# Patient Record
Sex: Male | Born: 1940 | Race: White | Hispanic: No | Smoking: Former smoker
Health system: Southern US, Community
[De-identification: ages and names within clinical notes are randomized; demographics above are authoritative.]

## PROBLEM LIST (undated history)

## (undated) DIAGNOSIS — N210 Calculus in bladder: Secondary | ICD-10-CM

## (undated) DIAGNOSIS — D509 Iron deficiency anemia, unspecified: Secondary | ICD-10-CM

## (undated) DIAGNOSIS — I10 Essential (primary) hypertension: Secondary | ICD-10-CM

## (undated) DIAGNOSIS — F32A Depression, unspecified: Secondary | ICD-10-CM

## (undated) DIAGNOSIS — E785 Hyperlipidemia, unspecified: Secondary | ICD-10-CM

## (undated) DIAGNOSIS — G4730 Sleep apnea, unspecified: Secondary | ICD-10-CM

## (undated) DIAGNOSIS — K219 Gastro-esophageal reflux disease without esophagitis: Secondary | ICD-10-CM

## (undated) DIAGNOSIS — F329 Major depressive disorder, single episode, unspecified: Secondary | ICD-10-CM

## (undated) DIAGNOSIS — G43909 Migraine, unspecified, not intractable, without status migrainosus: Secondary | ICD-10-CM

## (undated) HISTORY — PX: TONSILLECTOMY: SUR1361

## (undated) HISTORY — DX: Depression, unspecified: F32.A

## (undated) HISTORY — DX: Essential (primary) hypertension: I10

## (undated) HISTORY — DX: Sleep apnea, unspecified: G47.30

## (undated) HISTORY — DX: Hyperlipidemia, unspecified: E78.5

## (undated) HISTORY — PX: COLONOSCOPY: SHX174

## (undated) HISTORY — PX: OTHER SURGICAL HISTORY: SHX169

## (undated) HISTORY — DX: Migraine, unspecified, not intractable, without status migrainosus: G43.909

## (undated) HISTORY — DX: Major depressive disorder, single episode, unspecified: F32.9

## (undated) HISTORY — DX: Gastro-esophageal reflux disease without esophagitis: K21.9

---

## 2003-09-09 ENCOUNTER — Other Ambulatory Visit: Payer: Self-pay

## 2004-09-26 ENCOUNTER — Emergency Department: Payer: Self-pay | Admitting: Unknown Physician Specialty

## 2007-12-20 ENCOUNTER — Ambulatory Visit: Payer: Self-pay | Admitting: Gastroenterology

## 2008-03-05 ENCOUNTER — Ambulatory Visit: Payer: Self-pay | Admitting: Family Medicine

## 2009-07-10 ENCOUNTER — Ambulatory Visit: Payer: Self-pay | Admitting: Ophthalmology

## 2009-07-22 ENCOUNTER — Ambulatory Visit: Payer: Self-pay | Admitting: Ophthalmology

## 2009-09-04 ENCOUNTER — Ambulatory Visit: Payer: Self-pay | Admitting: Ophthalmology

## 2009-09-10 ENCOUNTER — Ambulatory Visit: Payer: Self-pay | Admitting: Ophthalmology

## 2009-12-05 ENCOUNTER — Ambulatory Visit: Payer: Self-pay | Admitting: Family Medicine

## 2011-09-30 DIAGNOSIS — F3342 Major depressive disorder, recurrent, in full remission: Secondary | ICD-10-CM | POA: Diagnosis not present

## 2011-10-07 DIAGNOSIS — F3342 Major depressive disorder, recurrent, in full remission: Secondary | ICD-10-CM | POA: Diagnosis not present

## 2011-10-14 DIAGNOSIS — E039 Hypothyroidism, unspecified: Secondary | ICD-10-CM | POA: Diagnosis not present

## 2011-10-14 DIAGNOSIS — E785 Hyperlipidemia, unspecified: Secondary | ICD-10-CM | POA: Diagnosis not present

## 2011-10-14 DIAGNOSIS — I10 Essential (primary) hypertension: Secondary | ICD-10-CM | POA: Diagnosis not present

## 2011-10-14 DIAGNOSIS — N400 Enlarged prostate without lower urinary tract symptoms: Secondary | ICD-10-CM | POA: Diagnosis not present

## 2011-11-02 DIAGNOSIS — F3342 Major depressive disorder, recurrent, in full remission: Secondary | ICD-10-CM | POA: Diagnosis not present

## 2011-11-17 DIAGNOSIS — E039 Hypothyroidism, unspecified: Secondary | ICD-10-CM | POA: Diagnosis not present

## 2011-11-17 DIAGNOSIS — J309 Allergic rhinitis, unspecified: Secondary | ICD-10-CM | POA: Diagnosis not present

## 2011-11-17 DIAGNOSIS — M47812 Spondylosis without myelopathy or radiculopathy, cervical region: Secondary | ICD-10-CM | POA: Diagnosis not present

## 2011-11-24 DIAGNOSIS — E039 Hypothyroidism, unspecified: Secondary | ICD-10-CM | POA: Diagnosis not present

## 2012-01-10 DIAGNOSIS — F3342 Major depressive disorder, recurrent, in full remission: Secondary | ICD-10-CM | POA: Diagnosis not present

## 2012-02-18 DIAGNOSIS — E039 Hypothyroidism, unspecified: Secondary | ICD-10-CM | POA: Diagnosis not present

## 2012-02-18 DIAGNOSIS — N400 Enlarged prostate without lower urinary tract symptoms: Secondary | ICD-10-CM | POA: Diagnosis not present

## 2012-02-18 DIAGNOSIS — G4700 Insomnia, unspecified: Secondary | ICD-10-CM | POA: Diagnosis not present

## 2012-02-23 DIAGNOSIS — Z125 Encounter for screening for malignant neoplasm of prostate: Secondary | ICD-10-CM | POA: Diagnosis not present

## 2012-02-23 DIAGNOSIS — N400 Enlarged prostate without lower urinary tract symptoms: Secondary | ICD-10-CM | POA: Diagnosis not present

## 2012-03-02 DIAGNOSIS — H04129 Dry eye syndrome of unspecified lacrimal gland: Secondary | ICD-10-CM | POA: Diagnosis not present

## 2012-04-06 DIAGNOSIS — F3342 Major depressive disorder, recurrent, in full remission: Secondary | ICD-10-CM | POA: Diagnosis not present

## 2012-06-12 ENCOUNTER — Ambulatory Visit: Payer: Self-pay | Admitting: Family Medicine

## 2012-06-12 DIAGNOSIS — J40 Bronchitis, not specified as acute or chronic: Secondary | ICD-10-CM | POA: Diagnosis not present

## 2012-06-12 DIAGNOSIS — R918 Other nonspecific abnormal finding of lung field: Secondary | ICD-10-CM | POA: Diagnosis not present

## 2012-06-12 DIAGNOSIS — R059 Cough, unspecified: Secondary | ICD-10-CM | POA: Diagnosis not present

## 2012-06-12 DIAGNOSIS — R042 Hemoptysis: Secondary | ICD-10-CM | POA: Diagnosis not present

## 2012-06-12 DIAGNOSIS — F17200 Nicotine dependence, unspecified, uncomplicated: Secondary | ICD-10-CM | POA: Diagnosis not present

## 2012-07-03 DIAGNOSIS — J40 Bronchitis, not specified as acute or chronic: Secondary | ICD-10-CM | POA: Diagnosis not present

## 2012-07-03 DIAGNOSIS — N400 Enlarged prostate without lower urinary tract symptoms: Secondary | ICD-10-CM | POA: Diagnosis not present

## 2012-07-03 DIAGNOSIS — Z8719 Personal history of other diseases of the digestive system: Secondary | ICD-10-CM | POA: Diagnosis not present

## 2012-07-03 DIAGNOSIS — Z23 Encounter for immunization: Secondary | ICD-10-CM | POA: Diagnosis not present

## 2012-07-03 DIAGNOSIS — E039 Hypothyroidism, unspecified: Secondary | ICD-10-CM | POA: Diagnosis not present

## 2012-07-04 DIAGNOSIS — F3342 Major depressive disorder, recurrent, in full remission: Secondary | ICD-10-CM | POA: Diagnosis not present

## 2012-07-07 DIAGNOSIS — I10 Essential (primary) hypertension: Secondary | ICD-10-CM | POA: Diagnosis not present

## 2012-07-07 DIAGNOSIS — E7800 Pure hypercholesterolemia, unspecified: Secondary | ICD-10-CM | POA: Diagnosis not present

## 2012-07-07 DIAGNOSIS — E039 Hypothyroidism, unspecified: Secondary | ICD-10-CM | POA: Diagnosis not present

## 2012-10-06 DIAGNOSIS — N400 Enlarged prostate without lower urinary tract symptoms: Secondary | ICD-10-CM | POA: Diagnosis not present

## 2012-10-06 DIAGNOSIS — M47812 Spondylosis without myelopathy or radiculopathy, cervical region: Secondary | ICD-10-CM | POA: Diagnosis not present

## 2012-10-06 DIAGNOSIS — E039 Hypothyroidism, unspecified: Secondary | ICD-10-CM | POA: Diagnosis not present

## 2012-10-17 DIAGNOSIS — F3342 Major depressive disorder, recurrent, in full remission: Secondary | ICD-10-CM | POA: Diagnosis not present

## 2013-01-16 DIAGNOSIS — F3342 Major depressive disorder, recurrent, in full remission: Secondary | ICD-10-CM | POA: Diagnosis not present

## 2013-02-14 DIAGNOSIS — E039 Hypothyroidism, unspecified: Secondary | ICD-10-CM | POA: Diagnosis not present

## 2013-02-14 DIAGNOSIS — E7800 Pure hypercholesterolemia, unspecified: Secondary | ICD-10-CM | POA: Diagnosis not present

## 2013-02-14 DIAGNOSIS — N400 Enlarged prostate without lower urinary tract symptoms: Secondary | ICD-10-CM | POA: Diagnosis not present

## 2013-02-14 DIAGNOSIS — I10 Essential (primary) hypertension: Secondary | ICD-10-CM | POA: Diagnosis not present

## 2013-03-01 DIAGNOSIS — H43819 Vitreous degeneration, unspecified eye: Secondary | ICD-10-CM | POA: Diagnosis not present

## 2013-04-16 DIAGNOSIS — E039 Hypothyroidism, unspecified: Secondary | ICD-10-CM | POA: Diagnosis not present

## 2013-04-16 DIAGNOSIS — E7800 Pure hypercholesterolemia, unspecified: Secondary | ICD-10-CM | POA: Diagnosis not present

## 2013-04-16 DIAGNOSIS — I10 Essential (primary) hypertension: Secondary | ICD-10-CM | POA: Diagnosis not present

## 2013-04-16 DIAGNOSIS — F329 Major depressive disorder, single episode, unspecified: Secondary | ICD-10-CM | POA: Diagnosis not present

## 2013-04-20 DIAGNOSIS — E039 Hypothyroidism, unspecified: Secondary | ICD-10-CM | POA: Diagnosis not present

## 2013-04-20 DIAGNOSIS — E7800 Pure hypercholesterolemia, unspecified: Secondary | ICD-10-CM | POA: Diagnosis not present

## 2013-04-20 DIAGNOSIS — I10 Essential (primary) hypertension: Secondary | ICD-10-CM | POA: Diagnosis not present

## 2013-05-22 DIAGNOSIS — F3342 Major depressive disorder, recurrent, in full remission: Secondary | ICD-10-CM | POA: Diagnosis not present

## 2013-06-04 DIAGNOSIS — Z23 Encounter for immunization: Secondary | ICD-10-CM | POA: Diagnosis not present

## 2013-07-19 DIAGNOSIS — E039 Hypothyroidism, unspecified: Secondary | ICD-10-CM | POA: Diagnosis not present

## 2013-07-19 DIAGNOSIS — M47812 Spondylosis without myelopathy or radiculopathy, cervical region: Secondary | ICD-10-CM | POA: Diagnosis not present

## 2013-07-19 DIAGNOSIS — E7800 Pure hypercholesterolemia, unspecified: Secondary | ICD-10-CM | POA: Diagnosis not present

## 2013-07-19 DIAGNOSIS — I10 Essential (primary) hypertension: Secondary | ICD-10-CM | POA: Diagnosis not present

## 2013-10-04 DIAGNOSIS — F3342 Major depressive disorder, recurrent, in full remission: Secondary | ICD-10-CM | POA: Diagnosis not present

## 2013-10-25 DIAGNOSIS — M1990 Unspecified osteoarthritis, unspecified site: Secondary | ICD-10-CM | POA: Diagnosis not present

## 2013-10-25 DIAGNOSIS — R7309 Other abnormal glucose: Secondary | ICD-10-CM | POA: Diagnosis not present

## 2013-10-25 DIAGNOSIS — I10 Essential (primary) hypertension: Secondary | ICD-10-CM | POA: Diagnosis not present

## 2013-10-25 DIAGNOSIS — R350 Frequency of micturition: Secondary | ICD-10-CM | POA: Diagnosis not present

## 2013-11-27 DIAGNOSIS — F3342 Major depressive disorder, recurrent, in full remission: Secondary | ICD-10-CM | POA: Diagnosis not present

## 2013-12-25 DIAGNOSIS — F3342 Major depressive disorder, recurrent, in full remission: Secondary | ICD-10-CM | POA: Diagnosis not present

## 2013-12-26 DIAGNOSIS — F3342 Major depressive disorder, recurrent, in full remission: Secondary | ICD-10-CM | POA: Diagnosis not present

## 2014-01-24 DIAGNOSIS — F3342 Major depressive disorder, recurrent, in full remission: Secondary | ICD-10-CM | POA: Diagnosis not present

## 2014-02-22 DIAGNOSIS — R7309 Other abnormal glucose: Secondary | ICD-10-CM | POA: Diagnosis not present

## 2014-02-22 DIAGNOSIS — M47812 Spondylosis without myelopathy or radiculopathy, cervical region: Secondary | ICD-10-CM | POA: Diagnosis not present

## 2014-02-22 DIAGNOSIS — E7800 Pure hypercholesterolemia, unspecified: Secondary | ICD-10-CM | POA: Diagnosis not present

## 2014-02-22 DIAGNOSIS — I10 Essential (primary) hypertension: Secondary | ICD-10-CM | POA: Diagnosis not present

## 2014-02-26 DIAGNOSIS — F3342 Major depressive disorder, recurrent, in full remission: Secondary | ICD-10-CM | POA: Diagnosis not present

## 2014-02-26 DIAGNOSIS — F341 Dysthymic disorder: Secondary | ICD-10-CM | POA: Diagnosis not present

## 2014-02-28 DIAGNOSIS — D509 Iron deficiency anemia, unspecified: Secondary | ICD-10-CM | POA: Diagnosis not present

## 2014-02-28 DIAGNOSIS — I10 Essential (primary) hypertension: Secondary | ICD-10-CM | POA: Diagnosis not present

## 2014-02-28 DIAGNOSIS — E7800 Pure hypercholesterolemia, unspecified: Secondary | ICD-10-CM | POA: Diagnosis not present

## 2014-02-28 DIAGNOSIS — R7309 Other abnormal glucose: Secondary | ICD-10-CM | POA: Diagnosis not present

## 2014-02-28 DIAGNOSIS — E039 Hypothyroidism, unspecified: Secondary | ICD-10-CM | POA: Diagnosis not present

## 2014-03-06 DIAGNOSIS — H43819 Vitreous degeneration, unspecified eye: Secondary | ICD-10-CM | POA: Diagnosis not present

## 2014-03-26 DIAGNOSIS — F3342 Major depressive disorder, recurrent, in full remission: Secondary | ICD-10-CM | POA: Diagnosis not present

## 2014-04-22 DIAGNOSIS — F3342 Major depressive disorder, recurrent, in full remission: Secondary | ICD-10-CM | POA: Diagnosis not present

## 2014-04-30 DIAGNOSIS — F3342 Major depressive disorder, recurrent, in full remission: Secondary | ICD-10-CM | POA: Diagnosis not present

## 2014-05-28 DIAGNOSIS — F3342 Major depressive disorder, recurrent, in full remission: Secondary | ICD-10-CM | POA: Diagnosis not present

## 2014-05-30 DIAGNOSIS — L570 Actinic keratosis: Secondary | ICD-10-CM | POA: Diagnosis not present

## 2014-05-30 DIAGNOSIS — L280 Lichen simplex chronicus: Secondary | ICD-10-CM | POA: Diagnosis not present

## 2014-05-30 DIAGNOSIS — L659 Nonscarring hair loss, unspecified: Secondary | ICD-10-CM | POA: Diagnosis not present

## 2014-05-30 DIAGNOSIS — L821 Other seborrheic keratosis: Secondary | ICD-10-CM | POA: Diagnosis not present

## 2014-05-30 DIAGNOSIS — L723 Sebaceous cyst: Secondary | ICD-10-CM | POA: Diagnosis not present

## 2014-06-18 DIAGNOSIS — Z23 Encounter for immunization: Secondary | ICD-10-CM | POA: Diagnosis not present

## 2014-06-25 DIAGNOSIS — F3342 Major depressive disorder, recurrent, in full remission: Secondary | ICD-10-CM | POA: Diagnosis not present

## 2014-06-26 DIAGNOSIS — M4692 Unspecified inflammatory spondylopathy, cervical region: Secondary | ICD-10-CM | POA: Diagnosis not present

## 2014-06-26 DIAGNOSIS — N403 Nodular prostate with lower urinary tract symptoms: Secondary | ICD-10-CM | POA: Diagnosis not present

## 2014-06-26 DIAGNOSIS — E039 Hypothyroidism, unspecified: Secondary | ICD-10-CM | POA: Diagnosis not present

## 2014-06-26 DIAGNOSIS — E611 Iron deficiency: Secondary | ICD-10-CM | POA: Diagnosis not present

## 2014-06-26 DIAGNOSIS — E780 Pure hypercholesterolemia: Secondary | ICD-10-CM | POA: Diagnosis not present

## 2014-06-26 DIAGNOSIS — F329 Major depressive disorder, single episode, unspecified: Secondary | ICD-10-CM | POA: Diagnosis not present

## 2014-06-26 DIAGNOSIS — Z87898 Personal history of other specified conditions: Secondary | ICD-10-CM | POA: Diagnosis not present

## 2014-06-26 DIAGNOSIS — I10 Essential (primary) hypertension: Secondary | ICD-10-CM | POA: Diagnosis not present

## 2014-06-26 DIAGNOSIS — Z1389 Encounter for screening for other disorder: Secondary | ICD-10-CM | POA: Diagnosis not present

## 2014-07-18 DIAGNOSIS — Z0000 Encounter for general adult medical examination without abnormal findings: Secondary | ICD-10-CM | POA: Diagnosis not present

## 2014-07-18 DIAGNOSIS — F329 Major depressive disorder, single episode, unspecified: Secondary | ICD-10-CM | POA: Diagnosis not present

## 2014-07-18 DIAGNOSIS — I10 Essential (primary) hypertension: Secondary | ICD-10-CM | POA: Diagnosis not present

## 2014-07-18 DIAGNOSIS — Z87898 Personal history of other specified conditions: Secondary | ICD-10-CM | POA: Diagnosis not present

## 2014-07-18 DIAGNOSIS — N403 Nodular prostate with lower urinary tract symptoms: Secondary | ICD-10-CM | POA: Diagnosis not present

## 2014-07-18 DIAGNOSIS — J302 Other seasonal allergic rhinitis: Secondary | ICD-10-CM | POA: Diagnosis not present

## 2014-07-18 DIAGNOSIS — M4692 Unspecified inflammatory spondylopathy, cervical region: Secondary | ICD-10-CM | POA: Diagnosis not present

## 2014-07-18 DIAGNOSIS — E780 Pure hypercholesterolemia: Secondary | ICD-10-CM | POA: Diagnosis not present

## 2014-07-29 DIAGNOSIS — I10 Essential (primary) hypertension: Secondary | ICD-10-CM | POA: Diagnosis not present

## 2014-07-29 DIAGNOSIS — R7309 Other abnormal glucose: Secondary | ICD-10-CM | POA: Diagnosis not present

## 2014-07-29 DIAGNOSIS — N403 Nodular prostate with lower urinary tract symptoms: Secondary | ICD-10-CM | POA: Diagnosis not present

## 2014-07-29 DIAGNOSIS — E611 Iron deficiency: Secondary | ICD-10-CM | POA: Diagnosis not present

## 2014-07-29 DIAGNOSIS — E780 Pure hypercholesterolemia: Secondary | ICD-10-CM | POA: Diagnosis not present

## 2014-07-30 DIAGNOSIS — F3342 Major depressive disorder, recurrent, in full remission: Secondary | ICD-10-CM | POA: Diagnosis not present

## 2014-08-12 DIAGNOSIS — F3342 Major depressive disorder, recurrent, in full remission: Secondary | ICD-10-CM | POA: Diagnosis not present

## 2014-08-19 DIAGNOSIS — L820 Inflamed seborrheic keratosis: Secondary | ICD-10-CM | POA: Diagnosis not present

## 2014-08-19 DIAGNOSIS — L821 Other seborrheic keratosis: Secondary | ICD-10-CM | POA: Diagnosis not present

## 2014-08-19 DIAGNOSIS — L409 Psoriasis, unspecified: Secondary | ICD-10-CM | POA: Diagnosis not present

## 2014-08-19 DIAGNOSIS — R209 Unspecified disturbances of skin sensation: Secondary | ICD-10-CM | POA: Diagnosis not present

## 2014-08-20 DIAGNOSIS — H9319 Tinnitus, unspecified ear: Secondary | ICD-10-CM | POA: Diagnosis not present

## 2014-08-20 DIAGNOSIS — H6123 Impacted cerumen, bilateral: Secondary | ICD-10-CM | POA: Diagnosis not present

## 2014-08-27 DIAGNOSIS — F3342 Major depressive disorder, recurrent, in full remission: Secondary | ICD-10-CM | POA: Diagnosis not present

## 2014-09-26 DIAGNOSIS — F3342 Major depressive disorder, recurrent, in full remission: Secondary | ICD-10-CM | POA: Diagnosis not present

## 2014-10-03 ENCOUNTER — Ambulatory Visit: Payer: Self-pay | Admitting: Gastroenterology

## 2014-10-03 DIAGNOSIS — Z6833 Body mass index (BMI) 33.0-33.9, adult: Secondary | ICD-10-CM | POA: Diagnosis not present

## 2014-10-03 DIAGNOSIS — E039 Hypothyroidism, unspecified: Secondary | ICD-10-CM | POA: Diagnosis not present

## 2014-10-03 DIAGNOSIS — D122 Benign neoplasm of ascending colon: Secondary | ICD-10-CM | POA: Diagnosis not present

## 2014-10-03 DIAGNOSIS — M479 Spondylosis, unspecified: Secondary | ICD-10-CM | POA: Diagnosis not present

## 2014-10-03 DIAGNOSIS — D120 Benign neoplasm of cecum: Secondary | ICD-10-CM | POA: Diagnosis not present

## 2014-10-03 DIAGNOSIS — E780 Pure hypercholesterolemia: Secondary | ICD-10-CM | POA: Diagnosis not present

## 2014-10-03 DIAGNOSIS — I10 Essential (primary) hypertension: Secondary | ICD-10-CM | POA: Diagnosis not present

## 2014-10-03 DIAGNOSIS — E663 Overweight: Secondary | ICD-10-CM | POA: Diagnosis not present

## 2014-10-03 DIAGNOSIS — Z86010 Personal history of colonic polyps: Secondary | ICD-10-CM | POA: Diagnosis not present

## 2014-10-03 DIAGNOSIS — K219 Gastro-esophageal reflux disease without esophagitis: Secondary | ICD-10-CM | POA: Diagnosis not present

## 2014-10-03 DIAGNOSIS — D125 Benign neoplasm of sigmoid colon: Secondary | ICD-10-CM | POA: Diagnosis not present

## 2014-10-29 DIAGNOSIS — F3342 Major depressive disorder, recurrent, in full remission: Secondary | ICD-10-CM | POA: Diagnosis not present

## 2014-11-04 DIAGNOSIS — F3342 Major depressive disorder, recurrent, in full remission: Secondary | ICD-10-CM | POA: Diagnosis not present

## 2014-11-19 DIAGNOSIS — I10 Essential (primary) hypertension: Secondary | ICD-10-CM | POA: Diagnosis not present

## 2014-11-19 DIAGNOSIS — E780 Pure hypercholesterolemia: Secondary | ICD-10-CM | POA: Diagnosis not present

## 2014-11-19 DIAGNOSIS — R7309 Other abnormal glucose: Secondary | ICD-10-CM | POA: Diagnosis not present

## 2014-11-19 DIAGNOSIS — N403 Nodular prostate with lower urinary tract symptoms: Secondary | ICD-10-CM | POA: Diagnosis not present

## 2014-11-19 DIAGNOSIS — Z87898 Personal history of other specified conditions: Secondary | ICD-10-CM | POA: Diagnosis not present

## 2014-11-19 DIAGNOSIS — M4692 Unspecified inflammatory spondylopathy, cervical region: Secondary | ICD-10-CM | POA: Diagnosis not present

## 2014-11-19 DIAGNOSIS — J302 Other seasonal allergic rhinitis: Secondary | ICD-10-CM | POA: Diagnosis not present

## 2014-11-19 DIAGNOSIS — E039 Hypothyroidism, unspecified: Secondary | ICD-10-CM | POA: Diagnosis not present

## 2014-11-26 DIAGNOSIS — F3342 Major depressive disorder, recurrent, in full remission: Secondary | ICD-10-CM | POA: Diagnosis not present

## 2014-12-31 DIAGNOSIS — F3342 Major depressive disorder, recurrent, in full remission: Secondary | ICD-10-CM | POA: Diagnosis not present

## 2015-01-10 ENCOUNTER — Emergency Department
Admit: 2015-01-10 | Disposition: A | Discharge status: Home / Self Care | Payer: Self-pay | Admitting: Emergency Medicine

## 2015-01-10 DIAGNOSIS — R51 Headache: Secondary | ICD-10-CM | POA: Diagnosis not present

## 2015-01-10 DIAGNOSIS — M542 Cervicalgia: Secondary | ICD-10-CM | POA: Diagnosis not present

## 2015-01-10 DIAGNOSIS — M549 Dorsalgia, unspecified: Secondary | ICD-10-CM | POA: Diagnosis not present

## 2015-01-10 DIAGNOSIS — S3992XA Unspecified injury of lower back, initial encounter: Secondary | ICD-10-CM | POA: Diagnosis not present

## 2015-01-10 DIAGNOSIS — S199XXA Unspecified injury of neck, initial encounter: Secondary | ICD-10-CM | POA: Diagnosis not present

## 2015-01-29 DIAGNOSIS — F3342 Major depressive disorder, recurrent, in full remission: Secondary | ICD-10-CM | POA: Diagnosis not present

## 2015-01-29 DIAGNOSIS — F439 Reaction to severe stress, unspecified: Secondary | ICD-10-CM | POA: Diagnosis not present

## 2015-03-25 DIAGNOSIS — Z961 Presence of intraocular lens: Secondary | ICD-10-CM | POA: Diagnosis not present

## 2015-04-14 ENCOUNTER — Encounter: Payer: Self-pay | Admitting: Family Medicine

## 2015-04-14 ENCOUNTER — Ambulatory Visit (INDEPENDENT_AMBULATORY_CARE_PROVIDER_SITE_OTHER): Payer: Medicare Other | Admitting: Family Medicine

## 2015-04-14 VITALS — BP 134/87 | HR 90 | Temp 98.1°F | Resp 16 | Ht 70.0 in | Wt 233.8 lb

## 2015-04-14 DIAGNOSIS — E039 Hypothyroidism, unspecified: Secondary | ICD-10-CM | POA: Insufficient documentation

## 2015-04-14 DIAGNOSIS — F3341 Major depressive disorder, recurrent, in partial remission: Secondary | ICD-10-CM | POA: Insufficient documentation

## 2015-04-14 DIAGNOSIS — Z8639 Personal history of other endocrine, nutritional and metabolic disease: Secondary | ICD-10-CM | POA: Insufficient documentation

## 2015-04-14 DIAGNOSIS — F329 Major depressive disorder, single episode, unspecified: Secondary | ICD-10-CM | POA: Insufficient documentation

## 2015-04-14 DIAGNOSIS — I10 Essential (primary) hypertension: Secondary | ICD-10-CM | POA: Insufficient documentation

## 2015-04-14 DIAGNOSIS — H9192 Unspecified hearing loss, left ear: Secondary | ICD-10-CM | POA: Diagnosis not present

## 2015-04-14 DIAGNOSIS — M1990 Unspecified osteoarthritis, unspecified site: Secondary | ICD-10-CM | POA: Insufficient documentation

## 2015-04-14 DIAGNOSIS — H9319 Tinnitus, unspecified ear: Secondary | ICD-10-CM | POA: Insufficient documentation

## 2015-04-14 DIAGNOSIS — Z0000 Encounter for general adult medical examination without abnormal findings: Secondary | ICD-10-CM

## 2015-04-14 DIAGNOSIS — G43909 Migraine, unspecified, not intractable, without status migrainosus: Secondary | ICD-10-CM | POA: Insufficient documentation

## 2015-04-14 DIAGNOSIS — G4700 Insomnia, unspecified: Secondary | ICD-10-CM | POA: Insufficient documentation

## 2015-04-14 DIAGNOSIS — N138 Other obstructive and reflux uropathy: Secondary | ICD-10-CM | POA: Insufficient documentation

## 2015-04-14 DIAGNOSIS — E782 Mixed hyperlipidemia: Secondary | ICD-10-CM | POA: Insufficient documentation

## 2015-04-14 DIAGNOSIS — J302 Other seasonal allergic rhinitis: Secondary | ICD-10-CM | POA: Insufficient documentation

## 2015-04-14 DIAGNOSIS — N400 Enlarged prostate without lower urinary tract symptoms: Secondary | ICD-10-CM | POA: Insufficient documentation

## 2015-04-14 DIAGNOSIS — N401 Enlarged prostate with lower urinary tract symptoms: Secondary | ICD-10-CM

## 2015-04-14 DIAGNOSIS — Z8619 Personal history of other infectious and parasitic diseases: Secondary | ICD-10-CM | POA: Insufficient documentation

## 2015-04-14 DIAGNOSIS — Z87898 Personal history of other specified conditions: Secondary | ICD-10-CM | POA: Insufficient documentation

## 2015-04-14 DIAGNOSIS — Z8768 Personal history of other (corrected) conditions arising in the perinatal period: Secondary | ICD-10-CM | POA: Insufficient documentation

## 2015-04-14 NOTE — Progress Notes (Signed)
Subjective:   Joe Hardy is a 74 y.o. male who presents for Medicare Annual/Subsequent preventive examination.  Review of Systems:  Negative except for:  L ear hearing issues-no tinnitus       Objective:    Vitals: BP 134/87 mmHg  Pulse 90  Temp(Src) 98.1 F (36.7 C) (Oral)  Resp 16  Ht 5\' 10"  (1.778 m)  Wt 233 lb 12.8 oz (106.051 kg)  BMI 33.55 kg/m2  Tobacco History  Smoking status  . Never Smoker   Smokeless tobacco  . Not on file     Counseling given: Not Answered   Past Medical History  Diagnosis Date  . Depression   . GERD (gastroesophageal reflux disease)   . Sleep apnea   . Hyperlipidemia   . Hypertension   . Migraine    History reviewed. No pertinent past surgical history. Family History  Problem Relation Age of Onset  . Family history unknown: Yes   History  Sexual Activity  . Sexual Activity: Not on file    Outpatient Encounter Prescriptions as of 04/14/2015  Medication Sig  . atorvastatin (LIPITOR) 20 MG tablet   . buPROPion (WELLBUTRIN XL) 150 MG 24 hr tablet   . Choline Fenofibrate (FENOFIBRIC ACID) 135 MG CPDR   . levothyroxine (SYNTHROID, LEVOTHROID) 75 MCG tablet   . lisinopril-hydrochlorothiazide (PRINZIDE,ZESTORETIC) 10-12.5 MG per tablet   . mirtazapine (REMERON SOL-TAB) 30 MG disintegrating tablet   . terazosin (HYTRIN) 5 MG capsule   . traZODone (DESYREL) 150 MG tablet    No facility-administered encounter medications on file as of 04/14/2015.    Activities of Daily Living In your present state of health, do you have any difficulty performing the following activities: 04/14/2015  Hearing? Y  Vision? N  Difficulty concentrating or making decisions? N  Walking or climbing stairs? N  Dressing or bathing? N  Doing errands, shopping? N  Preparing Food and eating ? Y  Using the Toilet? Y  In the past six months, have you accidently leaked urine? N  Do you have problems with loss of bowel control? N  Managing your  Medications? Y  Managing your Finances? Y  Housekeeping or managing your Housekeeping? Y    Patient Care Team: Arlis Porta., MD as PCP - General (Family Medicine)   Dr. Court Joy, Psychiatry Dr. Odelia Gage Eye Assessment:    Medicare Annual Wellness Visit    L hearing abnormal- refer to audiology for screening Exercise Activities and Dietary recommendations  Reduce sugar sweetened beverages and concentrated sweets.  Encouraged continued walking three times daily.   Advanced directive discussed, more information given.   Goals    . Reduce calorie intake to 2000 calories per day     Reduce calories from sugar sweetened beverages and concentrated sweets.     . Reduced depressive symptoms.     Pt would like to work with his psychiatrist to reduce depressive symptoms.     . Weight < 200 lb (90.719 kg)     Wants to loss weight and having less depression      Fall Risk Fall Risk  04/14/2015  Falls in the past year? No   Depression Screen PHQ 2/9 Scores 04/14/2015  PHQ - 2 Score 2  PHQ- 9 Score 4    Cognitive Testing MMSE - Mini Mental State Exam 04/14/2015  Orientation to time 5  Orientation to Place 5  Registration 3  Attention/ Calculation 5  Recall 3  Language- name 2  objects 2  Language- repeat 1  Language- follow 3 step command 3  Language- read & follow direction 1  Write a sentence 1  Copy design 1  Total score 30    Immunization History  Administered Date(s) Administered  . Influenza-Unspecified 06/13/2014  . Pneumococcal Conjugate-13 03/13/2014  . Pneumococcal Polysaccharide-23 09/13/2012  . Zoster 09/13/2012   Screening Tests There are no preventive care reminders to display for this patient.    Plan:    During the course of the visit the patient was educated and counseled about the following appropriate screening and preventive services:   Vaccines to include Pneumoccal, Influenza, Hepatitis B, Td, Zostavax, HCV  Cardiovascular  Disease  Colorectal cancer screening  Diabetes screening  Prostate Cancer Screening  Glaucoma screening  Nutrition counseling   Smoking cessation counseling  Patient Instructions (the written plan) was given to the patient.    Charlesia Canaday L Creta Dorame, NP  04/14/2015

## 2015-04-14 NOTE — Patient Instructions (Signed)

## 2015-04-17 ENCOUNTER — Encounter: Payer: Self-pay | Admitting: Family Medicine

## 2015-04-17 ENCOUNTER — Ambulatory Visit (INDEPENDENT_AMBULATORY_CARE_PROVIDER_SITE_OTHER): Payer: Medicare Other | Admitting: Family Medicine

## 2015-04-17 VITALS — BP 126/77 | HR 74 | Temp 98.3°F | Resp 16 | Ht 70.0 in | Wt 234.8 lb

## 2015-04-17 DIAGNOSIS — E039 Hypothyroidism, unspecified: Secondary | ICD-10-CM | POA: Diagnosis not present

## 2015-04-17 DIAGNOSIS — N401 Enlarged prostate with lower urinary tract symptoms: Secondary | ICD-10-CM

## 2015-04-17 DIAGNOSIS — N138 Other obstructive and reflux uropathy: Secondary | ICD-10-CM

## 2015-04-17 DIAGNOSIS — E7800 Pure hypercholesterolemia, unspecified: Secondary | ICD-10-CM

## 2015-04-17 DIAGNOSIS — I10 Essential (primary) hypertension: Secondary | ICD-10-CM

## 2015-04-17 DIAGNOSIS — R7309 Other abnormal glucose: Secondary | ICD-10-CM

## 2015-04-17 DIAGNOSIS — E119 Type 2 diabetes mellitus without complications: Secondary | ICD-10-CM | POA: Diagnosis not present

## 2015-04-17 LAB — POCT GLYCOSYLATED HEMOGLOBIN (HGB A1C): HEMOGLOBIN A1C: 5.7
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
## 2015-04-17 NOTE — Addendum Note (Signed)
Addended by: Theresia Majors A on: 04/17/2015 09:51 AM   Modules accepted: Miquel Dunn

## 2015-04-17 NOTE — Patient Instructions (Signed)
Continue to take meds as directed.  Continue to see Psych [id number].

## 2015-04-22 DIAGNOSIS — E039 Hypothyroidism, unspecified: Secondary | ICD-10-CM | POA: Diagnosis not present

## 2015-04-22 DIAGNOSIS — N401 Enlarged prostate with lower urinary tract symptoms: Secondary | ICD-10-CM | POA: Diagnosis not present

## 2015-04-22 DIAGNOSIS — E780 Pure hypercholesterolemia: Secondary | ICD-10-CM | POA: Diagnosis not present

## 2015-04-22 DIAGNOSIS — I10 Essential (primary) hypertension: Secondary | ICD-10-CM | POA: Diagnosis not present

## 2015-04-22 LAB — CBC WITH DIFFERENTIAL/PLATELET
Basophils Absolute: 0 10*3/uL (ref 0.0–0.2)
Basos: 1 %
EOS (ABSOLUTE): 0.2 10*3/uL (ref 0.0–0.4)
Eos: 3 %
HEMATOCRIT: 38.5 % (ref 37.5–51.0)
HEMOGLOBIN: 13 g/dL (ref 12.6–17.7)
Immature Grans (Abs): 0 10*3/uL (ref 0.0–0.1)
Immature Granulocytes: 0 %
LYMPHS: 33 %
Lymphocytes Absolute: 2.4 10*3/uL (ref 0.7–3.1)
MCH: 23.1 pg — AB (ref 26.6–33.0)
MCHC: 33.8 g/dL (ref 31.5–35.7)
MCV: 69 fL — ABNORMAL LOW (ref 79–97)
Monocytes Absolute: 0.8 10*3/uL (ref 0.1–0.9)
Monocytes: 10 %
NEUTROS ABS: 4 10*3/uL (ref 1.4–7.0)
NEUTROS PCT: 53 %
Platelets: 257 10*3/uL (ref 150–379)
RBC: 5.62 x10E6/uL (ref 4.14–5.80)
RDW: 16 % — ABNORMAL HIGH (ref 12.3–15.4)
WBC: 7.4 10*3/uL (ref 3.4–10.8)

## 2015-04-23 LAB — LIPID PANEL
CHOL/HDL RATIO: 4.3 ratio (ref 0.0–5.0)
Cholesterol, Total: 126 mg/dL (ref 100–199)
HDL: 29 mg/dL — ABNORMAL LOW (ref >39)
LDL CALC: 79 mg/dL (ref 0–99)
Triglycerides: 90 mg/dL (ref 0–149)
VLDL Cholesterol Cal: 18 mg/dL (ref 5–40)

## 2015-04-23 LAB — COMPREHENSIVE METABOLIC PANEL
ALBUMIN: 4.4 g/dL (ref 3.5–4.8)
ALT: 21 IU/L (ref 0–44)
AST: 23 IU/L (ref 0–40)
Albumin/Globulin Ratio: 2 (ref 1.1–2.5)
Alkaline Phosphatase: 33 IU/L — ABNORMAL LOW (ref 39–117)
BILIRUBIN TOTAL: 0.4 mg/dL (ref 0.0–1.2)
BUN/Creatinine Ratio: 12 (ref 10–22)
BUN: 15 mg/dL (ref 8–27)
CO2: 22 mmol/L (ref 18–29)
Calcium: 9.4 mg/dL (ref 8.6–10.2)
Chloride: 101 mmol/L (ref 97–108)
Creatinine, Ser: 1.22 mg/dL (ref 0.76–1.27)
GFR calc non Af Amer: 58 mL/min/{1.73_m2} — ABNORMAL LOW (ref >59)
GFR, EST AFRICAN AMERICAN: 68 mL/min/{1.73_m2} (ref >59)
Globulin, Total: 2.2 g/dL (ref 1.5–4.5)
Glucose: 98 mg/dL (ref 65–99)
POTASSIUM: 4.1 mmol/L (ref 3.5–5.2)
Sodium: 141 mmol/L (ref 134–144)
TOTAL PROTEIN: 6.6 g/dL (ref 6.0–8.5)

## 2015-04-23 LAB — TSH: TSH: 3.21 u[IU]/mL (ref 0.450–4.500)

## 2015-05-06 ENCOUNTER — Other Ambulatory Visit: Payer: Self-pay | Admitting: Otolaryngology

## 2015-05-06 DIAGNOSIS — R2689 Other abnormalities of gait and mobility: Secondary | ICD-10-CM

## 2015-05-06 DIAGNOSIS — H903 Sensorineural hearing loss, bilateral: Secondary | ICD-10-CM | POA: Diagnosis not present

## 2015-05-06 DIAGNOSIS — H9312 Tinnitus, left ear: Secondary | ICD-10-CM

## 2015-05-06 DIAGNOSIS — R42 Dizziness and giddiness: Secondary | ICD-10-CM

## 2015-05-08 ENCOUNTER — Ambulatory Visit
Admission: RE | Admit: 2015-05-08 | Discharge: 2015-05-08 | Disposition: A | Discharge status: Home / Self Care | Payer: Medicare Other | Source: Ambulatory Visit | Attending: Otolaryngology | Admitting: Otolaryngology

## 2015-05-08 ENCOUNTER — Ambulatory Visit: Payer: Self-pay

## 2015-05-08 DIAGNOSIS — H9319 Tinnitus, unspecified ear: Secondary | ICD-10-CM | POA: Diagnosis not present

## 2015-05-08 DIAGNOSIS — R42 Dizziness and giddiness: Secondary | ICD-10-CM

## 2015-05-08 DIAGNOSIS — R2689 Other abnormalities of gait and mobility: Secondary | ICD-10-CM

## 2015-05-08 DIAGNOSIS — H9312 Tinnitus, left ear: Secondary | ICD-10-CM

## 2015-05-09 ENCOUNTER — Other Ambulatory Visit: Payer: Self-pay | Admitting: Otolaryngology

## 2015-05-09 DIAGNOSIS — H9319 Tinnitus, unspecified ear: Secondary | ICD-10-CM

## 2015-05-12 DIAGNOSIS — L821 Other seborrheic keratosis: Secondary | ICD-10-CM | POA: Diagnosis not present

## 2015-05-12 DIAGNOSIS — L570 Actinic keratosis: Secondary | ICD-10-CM | POA: Diagnosis not present

## 2015-05-12 DIAGNOSIS — L720 Epidermal cyst: Secondary | ICD-10-CM | POA: Diagnosis not present

## 2015-05-12 DIAGNOSIS — D492 Neoplasm of unspecified behavior of bone, soft tissue, and skin: Secondary | ICD-10-CM | POA: Diagnosis not present

## 2015-05-16 ENCOUNTER — Ambulatory Visit
Admission: RE | Admit: 2015-05-16 | Discharge: 2015-05-16 | Disposition: A | Discharge status: Home / Self Care | Payer: Medicare Other | Source: Ambulatory Visit | Attending: Otolaryngology | Admitting: Otolaryngology

## 2015-05-16 DIAGNOSIS — H9319 Tinnitus, unspecified ear: Secondary | ICD-10-CM | POA: Diagnosis not present

## 2015-05-16 DIAGNOSIS — H9193 Unspecified hearing loss, bilateral: Secondary | ICD-10-CM | POA: Diagnosis not present

## 2015-05-16 MED ORDER — GADOBENATE DIMEGLUMINE 529 MG/ML IV SOLN
20.0000 mL | Freq: Once | INTRAVENOUS | Status: AC | PRN
  Administered 2015-05-16: 20 mL via INTRAVENOUS

## 2015-05-26 DIAGNOSIS — M9901 Segmental and somatic dysfunction of cervical region: Secondary | ICD-10-CM | POA: Diagnosis not present

## 2015-05-26 DIAGNOSIS — M9903 Segmental and somatic dysfunction of lumbar region: Secondary | ICD-10-CM | POA: Diagnosis not present

## 2015-05-26 DIAGNOSIS — M62830 Muscle spasm of back: Secondary | ICD-10-CM | POA: Diagnosis not present

## 2015-05-26 DIAGNOSIS — M5033 Other cervical disc degeneration, cervicothoracic region: Secondary | ICD-10-CM | POA: Diagnosis not present

## 2015-06-02 ENCOUNTER — Telehealth: Payer: Self-pay | Admitting: Family Medicine

## 2015-06-02 NOTE — Telephone Encounter (Signed)
Pt said he had frequent urination for about 1 week and suddenly that stopped.  He has no symptoms now.  He scheduled an appt for Thursday but asked if he should keep or cancel it.  Please call [phone]

## 2015-06-02 NOTE — Telephone Encounter (Signed)
Pt advised to cancel his appointment if he doesn't have any more Sx of UTI

## 2015-06-05 ENCOUNTER — Ambulatory Visit: Payer: Medicare Other | Admitting: Family Medicine

## 2015-06-23 DIAGNOSIS — M5033 Other cervical disc degeneration, cervicothoracic region: Secondary | ICD-10-CM | POA: Diagnosis not present

## 2015-06-23 DIAGNOSIS — M9901 Segmental and somatic dysfunction of cervical region: Secondary | ICD-10-CM | POA: Diagnosis not present

## 2015-06-23 DIAGNOSIS — M9903 Segmental and somatic dysfunction of lumbar region: Secondary | ICD-10-CM | POA: Diagnosis not present

## 2015-06-23 DIAGNOSIS — M62830 Muscle spasm of back: Secondary | ICD-10-CM | POA: Diagnosis not present

## 2015-07-18 ENCOUNTER — Encounter: Payer: Self-pay | Admitting: Family Medicine

## 2015-07-18 ENCOUNTER — Ambulatory Visit (INDEPENDENT_AMBULATORY_CARE_PROVIDER_SITE_OTHER): Payer: Medicare Other | Admitting: Family Medicine

## 2015-07-18 VITALS — BP 113/74 | HR 78 | Temp 98.3°F | Resp 16 | Ht 70.0 in | Wt 227.0 lb

## 2015-07-18 DIAGNOSIS — R739 Hyperglycemia, unspecified: Secondary | ICD-10-CM

## 2015-07-18 DIAGNOSIS — F329 Major depressive disorder, single episode, unspecified: Secondary | ICD-10-CM | POA: Diagnosis not present

## 2015-07-18 DIAGNOSIS — E7800 Pure hypercholesterolemia, unspecified: Secondary | ICD-10-CM

## 2015-07-18 DIAGNOSIS — F32A Depression, unspecified: Secondary | ICD-10-CM

## 2015-07-18 DIAGNOSIS — I10 Essential (primary) hypertension: Secondary | ICD-10-CM | POA: Diagnosis not present

## 2015-07-18 DIAGNOSIS — Z23 Encounter for immunization: Secondary | ICD-10-CM | POA: Diagnosis not present

## 2015-07-18 DIAGNOSIS — E039 Hypothyroidism, unspecified: Secondary | ICD-10-CM

## 2015-07-18 LAB — POCT GLYCOSYLATED HEMOGLOBIN (HGB A1C): HEMOGLOBIN A1C: 5.4

## 2015-07-18 NOTE — Addendum Note (Signed)
Addended by: Frederich Cha D on: 07/18/2015 10:01 AM   Modules accepted: Miquel Dunn

## 2015-07-18 NOTE — Patient Instructions (Addendum)
He is praised re: weight loss recently.  Continue current meds at present doses.
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
## 2015-11-24 ENCOUNTER — Encounter: Payer: Self-pay | Admitting: Family Medicine

## 2015-11-24 ENCOUNTER — Ambulatory Visit (INDEPENDENT_AMBULATORY_CARE_PROVIDER_SITE_OTHER): Payer: Medicare Other | Admitting: Family Medicine

## 2015-11-24 VITALS — BP 123/79 | HR 93 | Resp 16 | Ht 70.0 in | Wt 230.6 lb

## 2015-11-24 DIAGNOSIS — I10 Essential (primary) hypertension: Secondary | ICD-10-CM

## 2015-11-24 DIAGNOSIS — N401 Enlarged prostate with lower urinary tract symptoms: Secondary | ICD-10-CM

## 2015-11-24 DIAGNOSIS — E669 Obesity, unspecified: Secondary | ICD-10-CM | POA: Insufficient documentation

## 2015-11-24 DIAGNOSIS — E7800 Pure hypercholesterolemia, unspecified: Secondary | ICD-10-CM

## 2015-11-24 DIAGNOSIS — E038 Other specified hypothyroidism: Secondary | ICD-10-CM | POA: Diagnosis not present

## 2015-11-24 DIAGNOSIS — E034 Atrophy of thyroid (acquired): Secondary | ICD-10-CM

## 2015-11-24 DIAGNOSIS — N138 Other obstructive and reflux uropathy: Secondary | ICD-10-CM

## 2015-11-24 MED ORDER — TERAZOSIN HCL 5 MG PO CAPS: 5.0000 mg | ORAL_CAPSULE | Freq: Once | ORAL | Status: DC

## 2015-11-24 MED ORDER — LEVOTHYROXINE SODIUM 75 MCG PO TABS: 75.0000 ug | ORAL_TABLET | Freq: Every day | ORAL | Status: DC

## 2015-11-24 MED ORDER — ATORVASTATIN CALCIUM 20 MG PO TABS: 20.0000 mg | ORAL_TABLET | Freq: Every day | ORAL | Status: DC

## 2015-11-24 MED ORDER — FENOFIBRIC ACID 135 MG PO CPDR: 135.0000 mg | DELAYED_RELEASE_CAPSULE | Freq: Every morning | ORAL | Status: DC

## 2015-11-24 MED ORDER — LISINOPRIL-HYDROCHLOROTHIAZIDE 10-12.5 MG PO TABS: 1.0000 | ORAL_TABLET | Freq: Every day | ORAL | Status: DC

## 2015-11-24 NOTE — Progress Notes (Signed)
Name: Joe Hardy   MRN: [id number]    DOB: [birth year]   Date:11/24/2015       Progress Note  Subjective  Chief Complaint  Chief Complaint  Patient presents with  . Hypertension    HPI Here for f/u of HBP.  Overall feeling well except for arthritis pain due to cold and damp.  Taking all his meds.  BPs at other MD visits have been dong well.     No problem-specific assessment & plan notes found for this encounter.   Past Medical History  Diagnosis Date  . Depression   . GERD (gastroesophageal reflux disease)   . Sleep apnea   . Hyperlipidemia   . Hypertension   . Migraine     Past Surgical History  Procedure Laterality Date  . Tonsillectomy    . Oral surgery      Family History  Problem Relation Age of Onset  . Tuberculosis Mother   . Asthma Mother   . Emphysema Father   . Heart attack Father     Social History   Social History  . Marital Status: Divorced    Spouse Name: N/A  . Number of Children: N/A  . Years of Education: N/A   Occupational History  . Not on file.   Social History Main Topics  . Smoking status: Never Smoker   . Smokeless tobacco: Never Used  . Alcohol Use: No  . Drug Use: No  . Sexual Activity: Not on file   Other Topics Concern  . Not on file   Social History Narrative     Current outpatient prescriptions:  .  atorvastatin (LIPITOR) 20 MG tablet, Take 1 tablet (20 mg total) by mouth daily at 6 PM., Disp: 90 tablet, Rfl: 3 .  buPROPion (WELLBUTRIN XL) 150 MG 24 hr tablet, Take 150 mg by mouth 3 (three) times daily. , Disp: , Rfl:  .  Choline Fenofibrate (FENOFIBRIC ACID) 135 MG CPDR, Take 135 mg by mouth every morning., Disp: 90 capsule, Rfl: 3 .  levothyroxine (SYNTHROID, LEVOTHROID) 75 MCG tablet, Take 1 tablet (75 mcg total) by mouth daily before breakfast., Disp: 90 tablet, Rfl: 3 .  lisinopril-hydrochlorothiazide (PRINZIDE,ZESTORETIC) 10-12.5 MG tablet, Take 1 tablet by mouth daily., Disp: 90 tablet, Rfl: 3 .   mirtazapine (REMERON) 30 MG tablet, , Disp: , Rfl:  .  terazosin (HYTRIN) 5 MG capsule, Take 1 capsule (5 mg total) by mouth once., Disp: 90 capsule, Rfl: 3 .  traZODone (DESYREL) 150 MG tablet, , Disp: , Rfl:   Allergies  Allergen Reactions  . Fluoxetine     insomnia  . Venlafaxine     difficulty sleeping     Review of Systems  Constitutional: Negative for fever, chills, weight loss and malaise/fatigue.  HENT: Positive for congestion. Negative for hearing loss.   Eyes: Negative for blurred vision and double vision.  Respiratory: Positive for cough (mild with cold). Negative for shortness of breath and wheezing.   Cardiovascular: Negative for chest pain, palpitations and leg swelling.  Gastrointestinal: Negative for heartburn, abdominal pain and blood in stool.  Genitourinary: Positive for urgency (rare) and frequency (rare). Negative for dysuria.  Musculoskeletal: Positive for joint pain (diffuse).  Skin: Negative for rash.  Neurological: Negative for dizziness, tremors, weakness and headaches.       Objective  Filed Vitals:   11/24/15 0839  BP: 123/79  Pulse: 93  Resp: 16  Height: 5\' 10"  (1.778 m)  Weight: 230 lb 9.6 oz (104.599 kg)  Physical Exam  Constitutional: He is oriented to person, place, and time and well-developed, well-nourished, and in no distress. No distress.  HENT:  Head: Normocephalic and atraumatic.  Eyes: Conjunctivae and EOM are normal. Pupils are equal, round, and reactive to light. No scleral icterus.  Neck: Normal range of motion. Neck supple. Carotid bruit is not present. No thyromegaly present.  Cardiovascular: Normal rate, regular rhythm and normal heart sounds.  Exam reveals no gallop and no friction rub.   No murmur heard. Pulmonary/Chest: Effort normal and breath sounds normal. No respiratory distress. He has no wheezes. He has no rales.  Abdominal: Soft. Bowel sounds are normal. He exhibits no distension, no abdominal bruit and no  mass. There is no tenderness.  Musculoskeletal: Normal range of motion. He exhibits no edema.  Lymphadenopathy:    He has no cervical adenopathy.  Neurological: He is alert and oriented to person, place, and time.  Vitals reviewed.        No results found for this or any previous visit (from the past 2160 hour(s)).   Assessment & Plan  Problem List Items Addressed This Visit      Cardiovascular and Mediastinum   Essential (primary) hypertension   Relevant Medications   atorvastatin (LIPITOR) 20 MG tablet   Choline Fenofibrate (FENOFIBRIC ACID) 135 MG CPDR   lisinopril-hydrochlorothiazide (PRINZIDE,ZESTORETIC) 10-12.5 MG tablet   terazosin (HYTRIN) 5 MG capsule     Endocrine   Adult hypothyroidism   Relevant Medications   levothyroxine (SYNTHROID, LEVOTHROID) 75 MCG tablet     Genitourinary   Benign prostatic hyperplasia with urinary obstruction   Relevant Medications   terazosin (HYTRIN) 5 MG capsule     Other   Hypercholesteremia   Relevant Medications   atorvastatin (LIPITOR) 20 MG tablet   Choline Fenofibrate (FENOFIBRIC ACID) 135 MG CPDR   lisinopril-hydrochlorothiazide (PRINZIDE,ZESTORETIC) 10-12.5 MG tablet   terazosin (HYTRIN) 5 MG capsule   Obesity - Primary      Meds ordered this encounter  Medications  . mirtazapine (REMERON) 30 MG tablet    Sig:   . atorvastatin (LIPITOR) 20 MG tablet    Sig: Take 1 tablet (20 mg total) by mouth daily at 6 PM.    Dispense:  90 tablet    Refill:  3  . Choline Fenofibrate (FENOFIBRIC ACID) 135 MG CPDR    Sig: Take 135 mg by mouth every morning.    Dispense:  90 capsule    Refill:  3  . levothyroxine (SYNTHROID, LEVOTHROID) 75 MCG tablet    Sig: Take 1 tablet (75 mcg total) by mouth daily before breakfast.    Dispense:  90 tablet    Refill:  3  . lisinopril-hydrochlorothiazide (PRINZIDE,ZESTORETIC) 10-12.5 MG tablet    Sig: Take 1 tablet by mouth daily.    Dispense:  90 tablet    Refill:  3  . terazosin  (HYTRIN) 5 MG capsule    Sig: Take 1 capsule (5 mg total) by mouth once.    Dispense:  90 capsule    Refill:  3   1. Obesity   2. Essential (primary) hypertension  - lisinopril-hydrochlorothiazide (PRINZIDE,ZESTORETIC) 10-12.5 MG tablet; Take 1 tablet by mouth daily.  Dispense: 90 tablet; Refill: 3  3. Hypothyroidism due to acquired atrophy of thyroid  - levothyroxine (SYNTHROID, LEVOTHROID) 75 MCG tablet; Take 1 tablet (75 mcg total) by mouth daily before breakfast.  Dispense: 90 tablet; Refill: 3  4. Benign prostatic hyperplasia with urinary obstruction  - terazosin (HYTRIN)  5 MG capsule; Take 1 capsule (5 mg total) by mouth once.  Dispense: 90 capsule; Refill: 3  5. Hypercholesteremia  - atorvastatin (LIPITOR) 20 MG tablet; Take 1 tablet (20 mg total) by mouth daily at 6 PM.  Dispense: 90 tablet; Refill: 3 - Choline Fenofibrate (FENOFIBRIC ACID) 135 MG CPDR; Take 135 mg by mouth every morning.  Dispense: 90 capsule; Refill: 3

## 2016-03-29 ENCOUNTER — Ambulatory Visit: Payer: Medicare Other | Admitting: Family Medicine

## 2016-03-31 ENCOUNTER — Ambulatory Visit (INDEPENDENT_AMBULATORY_CARE_PROVIDER_SITE_OTHER): Payer: Medicare Other | Admitting: Family Medicine

## 2016-03-31 ENCOUNTER — Encounter: Payer: Self-pay | Admitting: Family Medicine

## 2016-03-31 VITALS — BP 132/79 | HR 84 | Temp 98.3°F | Resp 16 | Ht 70.0 in | Wt 237.0 lb

## 2016-03-31 DIAGNOSIS — E034 Atrophy of thyroid (acquired): Secondary | ICD-10-CM

## 2016-03-31 DIAGNOSIS — E669 Obesity, unspecified: Secondary | ICD-10-CM | POA: Diagnosis not present

## 2016-03-31 DIAGNOSIS — N400 Enlarged prostate without lower urinary tract symptoms: Secondary | ICD-10-CM

## 2016-03-31 DIAGNOSIS — R7309 Other abnormal glucose: Secondary | ICD-10-CM

## 2016-03-31 DIAGNOSIS — E7800 Pure hypercholesterolemia, unspecified: Secondary | ICD-10-CM | POA: Diagnosis not present

## 2016-03-31 DIAGNOSIS — I10 Essential (primary) hypertension: Secondary | ICD-10-CM

## 2016-03-31 DIAGNOSIS — E038 Other specified hypothyroidism: Secondary | ICD-10-CM | POA: Diagnosis not present

## 2016-03-31 DIAGNOSIS — R7303 Prediabetes: Secondary | ICD-10-CM | POA: Diagnosis not present

## 2016-03-31 LAB — POCT GLYCOSYLATED HEMOGLOBIN (HGB A1C): Hemoglobin A1C: 6.1

## 2016-03-31 NOTE — Progress Notes (Signed)
Name: Joe Hardy   MRN: [id number]    DOB: 1941-02-26   Date:03/31/2016       Progress Note  Subjective  Chief Complaint  Chief Complaint  Patient presents with  . Hypertension  . Hyperglycemia    HPI  Here for f/u of DM (diet controlled), hypothyroidism, HBP, and elevated lipids.   He had an odd "cold" earlier this summer, but it has resolved. No problem-specific assessment & plan notes found for this encounter.   Past Medical History  Diagnosis Date  . Depression   . GERD (gastroesophageal reflux disease)   . Sleep apnea   . Hyperlipidemia   . Hypertension   . Migraine     Past Surgical History  Procedure Laterality Date  . Tonsillectomy    . Oral surgery      Family History  Problem Relation Age of Onset  . Tuberculosis Mother   . Asthma Mother   . Emphysema Father   . Heart attack Father     Social History   Social History  . Marital Status: Divorced    Spouse Name: N/A  . Number of Children: N/A  . Years of Education: N/A   Occupational History  . Not on file.   Social History Main Topics  . Smoking status: Never Smoker   . Smokeless tobacco: Never Used  . Alcohol Use: No  . Drug Use: No  . Sexual Activity: Not on file   Other Topics Concern  . Not on file   Social History Narrative     Current outpatient prescriptions:  .  atorvastatin (LIPITOR) 20 MG tablet, Take 1 tablet (20 mg total) by mouth daily at 6 PM., Disp: 90 tablet, Rfl: 3 .  buPROPion (WELLBUTRIN XL) 150 MG 24 hr tablet, Take 150 mg by mouth 3 (three) times daily. , Disp: , Rfl:  .  Choline Fenofibrate (FENOFIBRIC ACID) 135 MG CPDR, Take 135 mg by mouth every morning., Disp: 90 capsule, Rfl: 3 .  levothyroxine (SYNTHROID, LEVOTHROID) 75 MCG tablet, Take 1 tablet (75 mcg total) by mouth daily before breakfast., Disp: 90 tablet, Rfl: 3 .  lisinopril-hydrochlorothiazide (PRINZIDE,ZESTORETIC) 10-12.5 MG tablet, Take 1 tablet by mouth daily., Disp: 90 tablet, Rfl: 3 .   mirtazapine (REMERON) 30 MG tablet, Take 30 mg by mouth at bedtime. , Disp: , Rfl:  .  terazosin (HYTRIN) 5 MG capsule, Take 1 capsule (5 mg total) by mouth once., Disp: 90 capsule, Rfl: 3 .  traZODone (DESYREL) 150 MG tablet, Take 150 mg by mouth at bedtime. , Disp: , Rfl:   Allergies  Allergen Reactions  . Fluoxetine     insomnia  . Venlafaxine     difficulty sleeping     Review of Systems  Constitutional: Negative for fever, chills, weight loss and malaise/fatigue.  HENT: Negative for hearing loss.   Eyes: Negative for blurred vision and double vision.  Respiratory: Negative for cough, shortness of breath and wheezing.   Cardiovascular: Negative for chest pain, palpitations and leg swelling.  Gastrointestinal: Negative for heartburn, abdominal pain and blood in stool.  Genitourinary: Negative for dysuria, urgency and frequency.  Musculoskeletal: Negative for myalgias and joint pain.  Skin: Negative for rash.  Neurological: Negative for dizziness, tremors, weakness and headaches.      Objective  Filed Vitals:   03/31/16 0821  BP: 132/79  Pulse: 84  Temp: 98.3 F (36.8 C)  TempSrc: Oral  Resp: 16  Height: 5\' 10"  (1.778 m)  Weight: 237 lb (  107.502 kg)    Physical Exam  Constitutional: He is oriented to person, place, and time. He appears distressed.  HENT:  Head: Normocephalic and atraumatic.  Eyes: Conjunctivae and EOM are normal. Pupils are equal, round, and reactive to light. No scleral icterus.  Neck: Normal range of motion. Neck supple. Carotid bruit is not present. No thyromegaly present.  Cardiovascular: Normal rate and regular rhythm.  Exam reveals no gallop and no friction rub.   No murmur heard. Pulmonary/Chest: Effort normal and breath sounds normal. No respiratory distress. He has no wheezes. He has no rales.  Musculoskeletal: He exhibits no edema.  Lymphadenopathy:    He has no cervical adenopathy.  Neurological: He is alert and oriented to person,  place, and time.  Vitals reviewed.      Recent Results (from the past 2160 hour(s))  POCT HgB A1C     Status: Abnormal   Collection Time: 03/31/16  8:42 AM  Result Value Ref Range   Hemoglobin A1C 6.1%      Assessment & Plan  Problem List Items Addressed This Visit      Cardiovascular and Mediastinum   Essential (primary) hypertension   Relevant Orders   COMPLETE METABOLIC PANEL WITH GFR     Endocrine   Adult hypothyroidism   Relevant Orders   CBC with Differential     Genitourinary   Benign fibroma of prostate     Other   Hypercholesteremia   Relevant Orders   Lipid Profile   Obesity   Relevant Orders   TSH   Pre-diabetes    Other Visit Diagnoses    Elevated glucose    -  Primary    Relevant Orders    POCT HgB A1C (Completed)       No orders of the defined types were placed in this encounter.   1. Elevated glucose  - POCT HgB A1C-6.0  2. Essential (primary) hypertension Cont Prinizide - COMPLETE METABOLIC PANEL WITH GFR  3. Hypothyroidism due to acquired atrophy of thyroid Cont Levothyroxine - CBC with Differential  4. Hypercholesteremia Cont Lipitor - Lipid Profile  5. Pre-diabetes   6. Obesity  - TSH  7. Benign fibroma of prostate Cont  Hytrin

## 2016-04-07 DIAGNOSIS — E7800 Pure hypercholesterolemia, unspecified: Secondary | ICD-10-CM | POA: Diagnosis not present

## 2016-04-07 DIAGNOSIS — E038 Other specified hypothyroidism: Secondary | ICD-10-CM | POA: Diagnosis not present

## 2016-04-07 DIAGNOSIS — E034 Atrophy of thyroid (acquired): Secondary | ICD-10-CM | POA: Diagnosis not present

## 2016-04-07 DIAGNOSIS — I10 Essential (primary) hypertension: Secondary | ICD-10-CM | POA: Diagnosis not present

## 2016-04-08 LAB — CBC WITH DIFFERENTIAL/PLATELET
Basophils Absolute: 0 cells/uL (ref 0–200)
Basophils Relative: 0 %
Eosinophils Absolute: 216 cells/uL (ref 15–500)
Eosinophils Relative: 3 %
HEMATOCRIT: 43.5 % (ref 38.5–50.0)
Hemoglobin: 13.7 g/dL (ref 13.2–17.1)
LYMPHS PCT: 31 %
Lymphs Abs: 2232 cells/uL (ref 850–3900)
MCH: 22.6 pg — ABNORMAL LOW (ref 27.0–33.0)
MCHC: 31.5 g/dL — ABNORMAL LOW (ref 32.0–36.0)
MCV: 71.8 fL — ABNORMAL LOW (ref 80.0–100.0)
MONOS PCT: 10 %
MPV: 9.7 fL (ref 7.5–12.5)
Monocytes Absolute: 720 cells/uL (ref 200–950)
NEUTROS PCT: 56 %
Neutro Abs: 4032 cells/uL (ref 1500–7800)
Platelets: 221 10*3/uL (ref 140–400)
RBC: 6.06 MIL/uL — AB (ref 4.20–5.80)
RDW: 16.1 % — ABNORMAL HIGH (ref 11.0–15.0)
WBC: 7.2 10*3/uL (ref 3.8–10.8)

## 2016-04-08 LAB — COMPLETE METABOLIC PANEL WITH GFR
ALK PHOS: 32 U/L — AB (ref 40–115)
ALT: 20 U/L (ref 9–46)
AST: 25 U/L (ref 10–35)
Albumin: 4.5 g/dL (ref 3.6–5.1)
BUN: 16 mg/dL (ref 7–25)
CHLORIDE: 104 mmol/L (ref 98–110)
CO2: 27 mmol/L (ref 20–31)
Calcium: 9.8 mg/dL (ref 8.6–10.3)
Creat: 1.22 mg/dL — ABNORMAL HIGH (ref 0.70–1.18)
GFR, EST NON AFRICAN AMERICAN: 58 mL/min — AB (ref >=60)
GFR, Est African American: 67 mL/min (ref >=60)
GLUCOSE: 107 mg/dL — AB (ref 65–99)
POTASSIUM: 4.5 mmol/L (ref 3.5–5.3)
Sodium: 140 mmol/L (ref 135–146)
TOTAL PROTEIN: 6.9 g/dL (ref 6.1–8.1)
Total Bilirubin: 0.4 mg/dL (ref 0.2–1.2)

## 2016-04-08 LAB — LIPID PANEL
CHOL/HDL RATIO: 4.7 ratio (ref <=5.0)
Cholesterol: 126 mg/dL (ref 125–200)
HDL: 27 mg/dL — AB (ref >=40)
LDL Cholesterol: 65 mg/dL (ref <130)
Triglycerides: 170 mg/dL — ABNORMAL HIGH (ref <150)
VLDL: 34 mg/dL — AB (ref <30)

## 2016-04-08 LAB — TSH: TSH: 3.13 mIU/L (ref 0.40–4.50)

## 2016-04-28 ENCOUNTER — Telehealth: Payer: Self-pay | Admitting: *Deleted

## 2016-04-28 NOTE — Telephone Encounter (Signed)
Called patient and left message to return call. Patient needs to d/c fenofibrate and continue atorvastatin per Dr. Luan Pulling. We received drug utilization from [facility] recommedations for changes.

## 2016-05-03 ENCOUNTER — Telehealth: Payer: Self-pay | Admitting: Family Medicine

## 2016-05-03 NOTE — Telephone Encounter (Signed)
Pt received a message on Aug 16th about changing a medication.  Please call [phone] after 11:30 today.

## 2016-05-03 NOTE — Telephone Encounter (Signed)
Patient aware d/c fenofibrate and continue atorvastatin.

## 2016-05-13 ENCOUNTER — Ambulatory Visit (INDEPENDENT_AMBULATORY_CARE_PROVIDER_SITE_OTHER): Payer: Medicare Other | Admitting: Family Medicine

## 2016-05-13 ENCOUNTER — Encounter: Payer: Self-pay | Admitting: Family Medicine

## 2016-05-13 DIAGNOSIS — Z0000 Encounter for general adult medical examination without abnormal findings: Secondary | ICD-10-CM

## 2016-05-13 NOTE — Patient Instructions (Signed)
Health Maintenance  Topic Date Due  . INFLUENZA VACCINE  07/15/2016 (Originally 04/13/2016)  . OPHTHALMOLOGY EXAM  08/13/2016 (Originally 04/06/2016)  . HEMOGLOBIN A1C  10/01/2016  . FOOT EXAM  05/13/2017  . COLONOSCOPY  09/13/2024  . TETANUS/TDAP  04/16/2025  . ZOSTAVAX  Completed  . PNA vac Low Risk Adult  Completed

## 2016-05-13 NOTE — Progress Notes (Signed)
Patient: Joe Hardy, Male    DOB: 1941/03/15, 75 y.o.   MRN: [id number] Visit Date: 05/13/2016  Today's Provider: Dicky Doe, MD   Chief Complaint  Patient presents with  . Annual Exam    Medicare Wellness evaluation    Subjective:    Annual wellness visit [name] is a 75 y.o. male who presents today for his Subsequent Annual Wellness Visit. He feels well. He reports exercising  infrequently. He reports he is sleeping well.   ----------------------------------------------------------- HPI  Review of Systems  Social History   Social History  . Marital status: Divorced    Spouse name: N/A  . Number of children: N/A  . Years of education: N/A   Occupational History  . Not on file.   Social History Main Topics  . Smoking status: Never Smoker  . Smokeless tobacco: Never Used  . Alcohol use No  . Drug use: No  . Sexual activity: Not on file   Other Topics Concern  . Not on file   Social History Narrative  . No narrative on file    Patient Active Problem List   Diagnosis Date Noted  . Pre-diabetes 03/31/2016  . Obesity 11/24/2015  . 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  . Benign fibroma of prostate 04/14/2015  . Cerumen impaction 04/14/2015  . Clinical depression 04/14/2015  . Arthritis, degenerative 04/14/2015  . Essential (primary) hypertension 04/14/2015  . Personal history of perinatal problems 04/14/2015  . H/O measles 04/14/2015  . Hypercholesteremia 04/14/2015  . BP (high blood pressure) 04/14/2015  . Adult hypothyroidism 04/14/2015  . Cannot sleep 04/14/2015  . Iron deficiency 04/14/2015  . Headache, migraine 04/14/2015  . Arthritis of neck ([place]) 04/14/2015  . Adiposity 04/14/2015  . Drug therapy discontinued 04/14/2015  . Excess weight 04/14/2015  . Buzzing in ear 04/14/2015  . Screening for gout 04/14/2015  . At risk for injury 04/14/2015  . Blood glucose elevated 04/14/2015  . Allergic rhinitis,  seasonal 04/14/2015  . FOM (frequency of micturition) 04/14/2015  . Multiple actinic keratoses 08/19/2014    Past Surgical History:  Procedure Laterality Date  . oral surgery    . TONSILLECTOMY      His family history includes Asthma in his mother; Emphysema in his father; Heart attack in his father; Tuberculosis in his mother.    Previous Medications   ATORVASTATIN (LIPITOR) 20 MG TABLET    Take 1 tablet (20 mg total) by mouth daily at 6 PM.   BUPROPION (WELLBUTRIN XL) 150 MG 24 HR TABLET    Take 150 mg by mouth 3 (three) times daily.    LEVOTHYROXINE (SYNTHROID, LEVOTHROID) 75 MCG TABLET    Take 1 tablet (75 mcg total) by mouth daily before breakfast.   LISINOPRIL-HYDROCHLOROTHIAZIDE (PRINZIDE,ZESTORETIC) 10-12.5 MG TABLET    Take 1 tablet by mouth daily.   MIRTAZAPINE (REMERON) 30 MG TABLET    Take 30 mg by mouth at bedtime.    TERAZOSIN (HYTRIN) 5 MG CAPSULE    Take 1 capsule (5 mg total) by mouth once.   TRAZODONE (DESYREL) 150 MG TABLET    Take 150 mg by mouth at bedtime.     Patient Care Team: Arlis Porta., MD as PCP - General (Family Medicine)     Objective:   Vitals: BP (!) 156/78 (BP Location: Left Arm, Patient Position: Sitting, Cuff Size: Large)   Pulse 87   Temp 98.3 F (36.8 C) (Oral)   Resp 16   Ht 5'  10" (1.778 m)   Wt 238 lb (108 kg)   BMI 34.15 kg/m   Physical Exam  Activities of Daily Living In your present state of health, do you have any difficulty performing the following activities: 05/13/2016 05/13/2016  Hearing? (No Data) Y  Vision? - N  Difficulty concentrating or making decisions? - N  Walking or climbing stairs? (No Data) Y  Dressing or bathing? - N  Doing errands, shopping? - N  Conservation officer, nature and eating ? - N  Using the Toilet? - N  In the past six months, have you accidently leaked urine? - N  Do you have problems with loss of bowel control? - N  Managing your Medications? - N  Managing your Finances? - N  Housekeeping or  managing your Housekeeping? - N  Some recent data might be hidden    Fall Risk Assessment Fall Risk  05/13/2016 11/24/2015 04/14/2015  Falls in the past year? No No No     Patient reports there are not safety devices in place in shower at home.   Depression Screen PHQ 2/9 Scores 05/13/2016 11/24/2015 04/14/2015  PHQ - 2 Score 0 0 2  PHQ- 9 Score - - 4     MMSE MMSE - Mini Mental State Exam 05/13/2016 04/14/2015  Orientation to time 5 5  Orientation to Place 5 5  Registration 3 3  Attention/ Calculation 5 5  Recall 3 3  Language- name 2 objects 2 2  Language- repeat 1 1  Language- follow 3 step command 3 3  Language- read & follow direction 1 1  Write a sentence 1 1  Copy design 1 1  Total score 30 30     Assessment & Plan:     Annual Wellness Visit  Reviewed patient's Family Medical History Reviewed and updated list of patient's medical providers Assessment of cognitive impairment was done Assessed patient's functional ability Established a written schedule for health screening [place] Completed and Reviewed  Exercise Activities and Dietary recommendations Goals    . Exercise 3x per week (30 min per time)          Patient would like to lose 30 pounds over the next year. He will increase exercise (walking).    . Reduce calorie intake to 2000 calories per day          Reduce calories from sugar sweetened beverages and concentrated sweets.     . Reduced depressive symptoms. (pt-stated)          Pt would like to work with his psychiatrist to reduce depressive symptoms.     . Weight < 200 lb (90.719 kg)          Wants to loss weight and having less depression       Immunization History  Administered Date(s) Administered  . Influenza, High Dose Seasonal PF 07/18/2015  . Influenza-Unspecified 06/13/2014  . Pneumococcal Conjugate-13 03/13/2014  . Pneumococcal Polysaccharide-23 09/13/2012  . Zoster 09/13/2012    Health Maintenance   Topic Date Due  . INFLUENZA VACCINE  07/15/2016 (Originally 04/13/2016)  . OPHTHALMOLOGY EXAM  08/13/2016 (Originally 04/06/2016)  . HEMOGLOBIN A1C  10/01/2016  . FOOT EXAM  05/13/2017  . COLONOSCOPY  09/13/2024  . TETANUS/TDAP  04/16/2025  . ZOSTAVAX  Completed  . PNA vac Low Risk Adult  Completed     Discussed health benefits of physical activity, and encouraged him to engage in regular exercise appropriate for his age and condition.    ------------------------------------------------------------------------------------------------------------  Problem List Items Addressed This Visit    None    Visit Diagnoses   None.      Larene Beach, MD [facility]  05/13/2016

## 2016-06-10 ENCOUNTER — Telehealth: Payer: Self-pay | Admitting: Family Medicine

## 2016-06-10 MED ORDER — DM-GUAIFENESIN ER 30-600 MG PO TB12: 1.0000 | ORAL_TABLET | Freq: Two times a day (BID) | ORAL | 0 refills | Status: DC

## 2016-06-10 NOTE — Telephone Encounter (Signed)
Patient aware.[place] 

## 2016-06-10 NOTE — Telephone Encounter (Signed)
Pt has chest congestion and cough.  He scheduled an appt for Tuesday because of the notice he has to give his transportation.  He did ask if Dr. Luan Pulling would be willing to call him something in without seeing him since that would be 5 days out.  He thinks it may have come from using a pesticide in his apartment.  His call back number is [phone]

## 2016-06-10 NOTE — Telephone Encounter (Signed)
If cough is coming from Pesticide in home, the best thing to take is Mucinex DM, 1 twice a day and Claritin 10 mg/d.  If that doesn't help some tonight let me know tomorrow AM.

## 2016-06-15 ENCOUNTER — Ambulatory Visit: Payer: Medicare Other | Admitting: Family Medicine

## 2016-07-30 DIAGNOSIS — H43813 Vitreous degeneration, bilateral: Secondary | ICD-10-CM | POA: Diagnosis not present

## 2016-08-17 ENCOUNTER — Encounter: Payer: Self-pay | Admitting: Family Medicine

## 2016-08-17 ENCOUNTER — Ambulatory Visit (INDEPENDENT_AMBULATORY_CARE_PROVIDER_SITE_OTHER): Payer: Medicare Other | Admitting: Family Medicine

## 2016-08-17 VITALS — BP 140/85 | HR 88 | Temp 98.4°F | Resp 16 | Ht 70.0 in | Wt 242.0 lb

## 2016-08-17 DIAGNOSIS — M159 Polyosteoarthritis, unspecified: Secondary | ICD-10-CM

## 2016-08-17 DIAGNOSIS — E611 Iron deficiency: Secondary | ICD-10-CM

## 2016-08-17 DIAGNOSIS — R7303 Prediabetes: Secondary | ICD-10-CM | POA: Diagnosis not present

## 2016-08-17 DIAGNOSIS — E7800 Pure hypercholesterolemia, unspecified: Secondary | ICD-10-CM

## 2016-08-17 DIAGNOSIS — Z6834 Body mass index (BMI) 34.0-34.9, adult: Secondary | ICD-10-CM

## 2016-08-17 DIAGNOSIS — I10 Essential (primary) hypertension: Secondary | ICD-10-CM | POA: Diagnosis not present

## 2016-08-17 DIAGNOSIS — E039 Hypothyroidism, unspecified: Secondary | ICD-10-CM | POA: Diagnosis not present

## 2016-08-17 DIAGNOSIS — E6609 Other obesity due to excess calories: Secondary | ICD-10-CM

## 2016-08-17 LAB — COMPLETE METABOLIC PANEL WITH GFR
ALBUMIN: 4.3 g/dL (ref 3.6–5.1)
ALK PHOS: 45 U/L (ref 40–115)
ALT: 34 U/L (ref 9–46)
AST: 28 U/L (ref 10–35)
BUN: 10 mg/dL (ref 7–25)
CO2: 25 mmol/L (ref 20–31)
Calcium: 9.3 mg/dL (ref 8.6–10.3)
Chloride: 104 mmol/L (ref 98–110)
Creat: 0.98 mg/dL (ref 0.70–1.18)
GFR, Est African American: 87 mL/min (ref >=60)
GFR, Est Non African American: 75 mL/min (ref >=60)
Glucose, Bld: 103 mg/dL — ABNORMAL HIGH (ref 65–99)
Potassium: 4.5 mmol/L (ref 3.5–5.3)
Sodium: 138 mmol/L (ref 135–146)
TOTAL PROTEIN: 6.6 g/dL (ref 6.1–8.1)
Total Bilirubin: 0.4 mg/dL (ref 0.2–1.2)

## 2016-08-17 LAB — CBC WITH DIFFERENTIAL/PLATELET
BASOS ABS: 70 {cells}/uL (ref 0–200)
Basophils Relative: 1 %
Eosinophils Absolute: 350 cells/uL (ref 15–500)
Eosinophils Relative: 5 %
HCT: 45.6 % (ref 38.5–50.0)
HEMOGLOBIN: 14.5 g/dL (ref 13.2–17.1)
LYMPHS ABS: 2100 {cells}/uL (ref 850–3900)
Lymphocytes Relative: 30 %
MCH: 23.1 pg — AB (ref 27.0–33.0)
MCHC: 31.8 g/dL — ABNORMAL LOW (ref 32.0–36.0)
MCV: 72.5 fL — AB (ref 80.0–100.0)
MONOS PCT: 9 %
MPV: 8.9 fL (ref 7.5–12.5)
Monocytes Absolute: 630 cells/uL (ref 200–950)
NEUTROS PCT: 55 %
Neutro Abs: 3850 cells/uL (ref 1500–7800)
Platelets: 194 10*3/uL (ref 140–400)
RBC: 6.29 MIL/uL — ABNORMAL HIGH (ref 4.20–5.80)
RDW: 16.9 % — ABNORMAL HIGH (ref 11.0–15.0)
WBC: 7 10*3/uL (ref 3.8–10.8)

## 2016-08-17 LAB — LIPID PANEL
CHOLESTEROL: 128 mg/dL (ref <200)
HDL: 32 mg/dL — ABNORMAL LOW (ref >40)
LDL Cholesterol: 72 mg/dL (ref <100)
Total CHOL/HDL Ratio: 4 Ratio (ref <5.0)
Triglycerides: 121 mg/dL (ref <150)
VLDL: 24 mg/dL (ref <30)

## 2016-08-17 LAB — TSH: TSH: 2.75 mIU/L (ref 0.40–4.50)

## 2016-08-17 LAB — HEMOGLOBIN A1C
HEMOGLOBIN A1C: 5.7 % — AB (ref <5.7)
Mean Plasma Glucose: 117 mg/dL

## 2016-08-17 NOTE — Progress Notes (Signed)
Name: Joe Hardy   MRN: [id number]    DOB: 20-Feb-1941   Date:08/17/2016       Progress Note  Subjective  Chief Complaint  Chief Complaint  Patient presents with  . Hyperglycemia  . Hypertension  . Hyperlipidemia    HPI Here for f/u of HBP, elevated lipids and elevated glucose.  He is taking meds and doing well overall.   No extra problems except weight gain.  No problem-specific Assessment & Plan notes found for this encounter.   Past Medical History:  Diagnosis Date  . Depression   . GERD (gastroesophageal reflux disease)   . Hyperlipidemia   . Hypertension   . Migraine   . Sleep apnea     Past Surgical History:  Procedure Laterality Date  . oral surgery    . TONSILLECTOMY      Family History  Problem Relation Age of Onset  . Tuberculosis Mother   . Asthma Mother   . Emphysema Father   . Heart attack Father     Social History   Social History  . Marital status: Divorced    Spouse name: N/A  . Number of children: N/A  . Years of education: N/A   Occupational History  . Not on file.   Social History Main Topics  . Smoking status: Never Smoker  . Smokeless tobacco: Never Used  . Alcohol use No  . Drug use: No  . Sexual activity: Not on file   Other Topics Concern  . Not on file   Social History Narrative  . No narrative on file     Current Outpatient Prescriptions:  .  atorvastatin (LIPITOR) 20 MG tablet, Take 1 tablet (20 mg total) by mouth daily at 6 PM., Disp: 90 tablet, Rfl: 3 .  buPROPion (WELLBUTRIN XL) 150 MG 24 hr tablet, Take 150 mg by mouth 3 (three) times daily. , Disp: , Rfl:  .  levothyroxine (SYNTHROID, LEVOTHROID) 75 MCG tablet, Take 1 tablet (75 mcg total) by mouth daily before breakfast., Disp: 90 tablet, Rfl: 3 .  lisinopril-hydrochlorothiazide (PRINZIDE,ZESTORETIC) 10-12.5 MG tablet, Take 1 tablet by mouth daily., Disp: 90 tablet, Rfl: 3 .  mirtazapine (REMERON) 30 MG tablet, Take 30 mg by mouth at bedtime. , Disp: , Rfl:   .  terazosin (HYTRIN) 5 MG capsule, Take 1 capsule (5 mg total) by mouth once., Disp: 90 capsule, Rfl: 3 .  traZODone (DESYREL) 150 MG tablet, Take 150 mg by mouth at bedtime. , Disp: , Rfl:   Allergies  Allergen Reactions  . Fluoxetine     insomnia  . Venlafaxine     difficulty sleeping     Review of Systems  Constitutional: Negative for chills, fever, malaise/fatigue and weight loss.  HENT: Negative for hearing loss and tinnitus.   Eyes: Negative for blurred vision and double vision.  Respiratory: Negative for cough, hemoptysis and wheezing.   Cardiovascular: Negative for chest pain, palpitations and leg swelling.  Gastrointestinal: Negative for abdominal pain, blood in stool and heartburn.  Genitourinary: Negative for dysuria, frequency and urgency.  Musculoskeletal: Positive for back pain and joint pain. Negative for myalgias.  Skin: Negative for rash.  Neurological: Negative for dizziness, tingling, tremors, weakness and headaches.  Psychiatric/Behavioral: Negative for depression. The patient is not nervous/anxious and does not have insomnia.       Objective  Vitals:   08/17/16 0851 08/17/16 0946  BP: (!) 159/97 140/85  Pulse: 88   Resp: 16   Temp: 98.4 F (36.9  C)   TempSrc: Oral   Weight: 242 lb (109.8 kg)   Height: 5\' 10"  (1.778 m)     Physical Exam  Constitutional: He is oriented to person, place, and time and well-developed, well-nourished, and in no distress. No distress.  HENT:  Head: Normocephalic and atraumatic.  Eyes: Conjunctivae and EOM are normal. Pupils are equal, round, and reactive to light. No scleral icterus.  Neck: Normal range of motion. Neck supple. Carotid bruit is not present. No thyromegaly present.  Cardiovascular: Normal rate, regular rhythm and normal heart sounds.  Exam reveals no gallop and no friction rub.   No murmur heard. Pulmonary/Chest: Effort normal and breath sounds normal. No respiratory distress. He has no wheezes. He has  no rales.  Abdominal: Soft. Bowel sounds are normal. He exhibits no distension, no abdominal bruit and no mass. There is no tenderness.  Musculoskeletal: He exhibits no edema.  Lymphadenopathy:    He has no cervical adenopathy.  Neurological: He is alert and oriented to person, place, and time.  Vitals reviewed.       No results found for this or any previous visit (from the past 2160 hour(s)).   Assessment & Plan  Problem List Items Addressed This Visit      Cardiovascular and Mediastinum   Essential (primary) hypertension - Primary   Relevant Orders   COMPLETE METABOLIC PANEL WITH GFR     Endocrine   Adult hypothyroidism   Relevant Orders   TSH     Musculoskeletal and Integument   Arthritis, degenerative     Other   Hypercholesteremia   Relevant Orders   Lipid Profile   Iron deficiency   Relevant Orders   CBC with Differential   Obesity   Pre-diabetes   Relevant Orders   HgB A1c      1. Essential (primary) hypertension Cont meds - COMPLETE METABOLIC PANEL WITH GFR  2. Adult hypothyroidism Cont meds- TSH  3. Osteoarthritis of multiple joints, unspecified osteoarthritis type   4. Pre-diabetes Discussed diet and exercise- HgB A1c  5. Class 1 obesity due to excess calories without serious comorbidity with body mass index (BMI) of 34.0 to 34.9 in adult   6. Hypercholesteremia Cont med - Lipid Profile  7. Iron deficiency  - CBC with Differential

## 2016-09-20 ENCOUNTER — Other Ambulatory Visit: Payer: Self-pay | Admitting: Family Medicine

## 2016-09-20 DIAGNOSIS — N401 Enlarged prostate with lower urinary tract symptoms: Secondary | ICD-10-CM

## 2016-09-20 DIAGNOSIS — I10 Essential (primary) hypertension: Secondary | ICD-10-CM

## 2016-09-20 DIAGNOSIS — N138 Other obstructive and reflux uropathy: Secondary | ICD-10-CM

## 2016-09-20 DIAGNOSIS — E7800 Pure hypercholesterolemia, unspecified: Secondary | ICD-10-CM

## 2016-09-20 DIAGNOSIS — E034 Atrophy of thyroid (acquired): Secondary | ICD-10-CM

## 2016-12-20 ENCOUNTER — Ambulatory Visit: Payer: Medicare Other | Admitting: Family Medicine

## 2016-12-21 ENCOUNTER — Ambulatory Visit: Payer: Medicare Other | Admitting: Family Medicine

## 2016-12-29 ENCOUNTER — Encounter: Payer: Self-pay | Admitting: Nurse Practitioner

## 2016-12-29 ENCOUNTER — Ambulatory Visit (INDEPENDENT_AMBULATORY_CARE_PROVIDER_SITE_OTHER): Payer: Medicare Other | Admitting: Nurse Practitioner

## 2016-12-29 VITALS — BP 140/84 | HR 94 | Temp 97.8°F | Resp 16 | Ht 70.0 in | Wt 226.0 lb

## 2016-12-29 DIAGNOSIS — N138 Other obstructive and reflux uropathy: Secondary | ICD-10-CM

## 2016-12-29 DIAGNOSIS — I10 Essential (primary) hypertension: Secondary | ICD-10-CM

## 2016-12-29 DIAGNOSIS — N401 Enlarged prostate with lower urinary tract symptoms: Secondary | ICD-10-CM

## 2016-12-29 DIAGNOSIS — E7800 Pure hypercholesterolemia, unspecified: Secondary | ICD-10-CM | POA: Diagnosis not present

## 2016-12-29 DIAGNOSIS — R739 Hyperglycemia, unspecified: Secondary | ICD-10-CM

## 2016-12-29 DIAGNOSIS — E034 Atrophy of thyroid (acquired): Secondary | ICD-10-CM

## 2016-12-29 LAB — COMPLETE METABOLIC PANEL WITH GFR
ALT: 23 U/L (ref 9–46)
AST: 22 U/L (ref 10–35)
Albumin: 4.3 g/dL (ref 3.6–5.1)
Alkaline Phosphatase: 53 U/L (ref 40–115)
BUN: 12 mg/dL (ref 7–25)
CO2: 23 mmol/L (ref 20–31)
Calcium: 9.7 mg/dL (ref 8.6–10.3)
Chloride: 102 mmol/L (ref 98–110)
Creat: 0.95 mg/dL (ref 0.70–1.18)
GFR, Est African American: >89 mL/min (ref >=60)
GFR, Est Non African American: 78 mL/min (ref >=60)
Glucose, Bld: 109 mg/dL — ABNORMAL HIGH (ref 65–99)
Potassium: 4.3 mmol/L (ref 3.5–5.3)
Sodium: 135 mmol/L (ref 135–146)
Total Bilirubin: 0.4 mg/dL (ref 0.2–1.2)
Total Protein: 6.8 g/dL (ref 6.1–8.1)

## 2016-12-29 MED ORDER — ATORVASTATIN CALCIUM 20 MG PO TABS: ORAL_TABLET | ORAL | 3 refills | Status: DC

## 2016-12-29 MED ORDER — TERAZOSIN HCL 5 MG PO CAPS: ORAL_CAPSULE | ORAL | 3 refills | Status: DC

## 2016-12-29 MED ORDER — LISINOPRIL-HYDROCHLOROTHIAZIDE 10-12.5 MG PO TABS: 1.0000 | ORAL_TABLET | Freq: Every day | ORAL | 3 refills | Status: DC

## 2016-12-29 MED ORDER — LEVOTHYROXINE SODIUM 75 MCG PO TABS: ORAL_TABLET | ORAL | 3 refills | Status: DC

## 2016-12-29 MED ORDER — SM WRIST CUFF BP MONITOR MISC: 1.0000 [IU] | 0 refills | Status: DC
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
## 2016-12-29 NOTE — Patient Instructions (Signed)
Joe Hardy, Thank you for coming in to clinic today.  1. Continue your work to stay busy and avoid eating when you are bored. 2. Continue taking your medicines every day.  We are not making any changes 3. For your blood pressure,  Start checking your BP twice per week at home.  This will allow [place] to better treat your BP with medication.  Keep a log of your BP readings.  I have sent a prescription for a wrist cuff to [facility] for you to buy.  Please schedule a follow-up appointment with Cassell Smiles, AGNP in 3 months.  If you have any other questions or concerns, please feel free to call the clinic or send a message through [place]. You may also schedule an earlier appointment if necessary.  Cassell Smiles, DNP, AGNP-BC Adult Gerontology Nurse Practitioner [facility]    How to Take Your Blood Pressure Blood pressure is a measurement of how strongly your blood is pressing against the walls of your arteries. Arteries are blood vessels that carry blood from your heart throughout your body. Your health care provider takes your blood pressure at each office visit. You can also take your own blood pressure at home with a blood pressure machine. You may need to take your own blood pressure:  To confirm a diagnosis of high blood pressure (hypertension).  To monitor your blood pressure over time.  To make sure your blood pressure medicine is working. Supplies needed: To take your blood pressure, you will need a blood pressure machine. You can buy a blood pressure machine, or blood pressure monitor, at most drugstores or online. There are several types of home blood pressure monitors. When choosing one, consider the following:  Choose a monitor that has an arm cuff.  Choose a monitor that wraps snugly around your upper arm. You should be able to fit only one finger between your arm and the cuff.  Do not choose a monitor that measures your blood pressure from your  wrist or finger. Your health care provider can suggest a reliable monitor that will meet your needs. How to prepare To get the most accurate reading, avoid the following for 30 minutes before you check your blood pressure:  Drinking caffeine.  Drinking alcohol.  Eating.  Smoking.  Exercising. Five minutes before you check your blood pressure:  Empty your bladder.  Sit quietly without talking in a dining chair, rather than in a soft couch or armchair. How to take your blood pressure To check your blood pressure, follow the instructions in the manual that came with your blood pressure monitor. If you have a digital blood pressure monitor, the instructions may be as follows: 1. Sit up straight. 2. Place your feet on the floor. Do not cross your ankles or legs. 3. Rest your left arm at the level of your heart on a table or desk or on the arm of a chair. 4. Pull up your shirt sleeve. 5. Wrap the blood pressure cuff around the upper part of your left arm, 1 inch (2.5 cm) above your elbow. It is best to wrap the cuff around bare skin. 6. Fit the cuff snugly around your arm. You should be able to place only one finger between the cuff and your arm. 7. Position the cord inside the groove of your elbow. 8. Press the power button. 9. Sit quietly while the cuff inflates and deflates. 10. Read the digital reading on the monitor screen and write  it down (record it). 11. Wait 2-3 minutes, then repeat the steps, starting at step 1. What does my blood pressure reading mean? A blood pressure reading consists of a higher number over a lower number. Ideally, your blood pressure should be below 120/80. The first ("top") number is called the systolic pressure. It is a measure of the pressure in your arteries as your heart beats. The second ("bottom") number is called the diastolic pressure. It is a measure of the pressure in your arteries as the heart relaxes. Blood pressure is classified into four  stages. The following are the stages for adults who do not have a short-term serious illness or a chronic condition. Systolic pressure and diastolic pressure are measured in a unit called mm Hg. Normal   Systolic pressure: below 828.  Diastolic pressure: below 80. Elevated   Systolic pressure: 003-491.  Diastolic pressure: below 80. Hypertension stage 1   Systolic pressure: 791-505.  Diastolic pressure: 69-79. Hypertension stage 2   Systolic pressure: 480 or above.  Diastolic pressure: 90 or above. You can have prehypertension or hypertension even if only the systolic or only the diastolic number in your reading is higher than normal. Follow these instructions at home:  Check your blood pressure as often as recommended by your health care provider.  Take your monitor to the next appointment with your health care provider to make sure:  That you are using it correctly.  That it provides accurate readings.  Be sure you understand what your goal blood pressure numbers are.  Tell your health care provider if you are having any side effects from blood pressure medicine. Contact a health care provider if:  Your blood pressure is consistently high. Get help right away if:  Your systolic blood pressure is higher than 180.  Your diastolic blood pressure is higher than 110. This information is not intended to replace advice given to you by your health care provider. Make sure you discuss any questions you have with your health care provider. Document Released: 02/06/2016 Document Revised: 04/20/2016 Document Reviewed: 02/06/2016 Elsevier Interactive Patient Education  2017 [employer].

## 2016-12-30 LAB — HEMOGLOBIN A1C
Hgb A1c MFr Bld: 5.5 % (ref <5.7)
Mean Plasma Glucose: 111 mg/dL

## 2016-12-30 NOTE — Progress Notes (Signed)
I have reviewed this encounter including the documentation in this note and/or discussed this patient with the provider, Cassell Smiles, AGPCNP-BC. I am certifying that I agree with the content of this note as supervising physician.  Nobie Putnam, [facility] 12/30/2016, 9:26 AM

## 2016-12-31 ENCOUNTER — Other Ambulatory Visit: Payer: Self-pay | Admitting: Nurse Practitioner

## 2016-12-31 DIAGNOSIS — N138 Other obstructive and reflux uropathy: Secondary | ICD-10-CM

## 2016-12-31 DIAGNOSIS — N401 Enlarged prostate with lower urinary tract symptoms: Principal | ICD-10-CM

## 2016-12-31 MED ORDER — TERAZOSIN HCL 5 MG PO CAPS: ORAL_CAPSULE | ORAL | 3 refills | Status: DC

## 2017-02-15 ENCOUNTER — Telehealth: Payer: Self-pay | Admitting: Nurse Practitioner

## 2017-02-15 NOTE — Telephone Encounter (Signed)
Called pt to schedule Annual Wellness Visit with Nurse Health Advisor for July move from Lauren's schedule in September to Tiffany's schedule for July before f/up appt:  - Joe Hardy

## 2017-04-05 ENCOUNTER — Ambulatory Visit (INDEPENDENT_AMBULATORY_CARE_PROVIDER_SITE_OTHER): Payer: Medicare Other

## 2017-04-05 ENCOUNTER — Ambulatory Visit: Payer: Medicare Other | Admitting: Nurse Practitioner

## 2017-04-05 VITALS — BP 140/82 | HR 68 | Temp 98.7°F | Resp 16 | Ht 68.0 in | Wt 227.6 lb

## 2017-04-05 DIAGNOSIS — Z0000 Encounter for general adult medical examination without abnormal findings: Secondary | ICD-10-CM | POA: Diagnosis not present

## 2017-04-05 NOTE — Progress Notes (Signed)
Subjective:   Joe Hardy is a 76 y.o. male who presents for Medicare Annual/Subsequent preventive examination.  Review of Systems:  Cardiac Risk Factors include: male gender;advanced age (>59men, >65 women);obesity (BMI >30kg/m2);smoking/ tobacco exposure;hypertension;dyslipidemia     Objective:    Vitals: BP 140/82 (BP Location: Left Arm, Patient Position: Sitting)   Pulse 68   Temp 98.7 F (37.1 C)   Resp 16   Ht 5\' 8"  (1.727 m)   Wt 227 lb 9.6 oz (103.2 kg)   BMI 34.61 kg/m   Body mass index is 34.61 kg/m.  Tobacco History  Smoking Status  . Former Smoker  . Packs/day: 1.50  . Years: 20.00  . Types: Cigarettes  Smokeless Tobacco  . Never Used    Comment: Starts and stops related to anxiety rather than drinking alcohol     Counseling given: Not Answered   Past Medical History:  Diagnosis Date  . Depression   . GERD (gastroesophageal reflux disease)   . Hyperlipidemia   . Hypertension   . Migraine   . Sleep apnea    Past Surgical History:  Procedure Laterality Date  . oral surgery    . TONSILLECTOMY     Family History  Problem Relation Age of Onset  . Tuberculosis Mother   . Asthma Mother   . Emphysema Father   . Heart attack Father    History  Sexual Activity  . Sexual activity: Not Currently    Outpatient Encounter Prescriptions as of 04/05/2017  Medication Sig  . Ascorbic Acid (VITAMIN C) 1000 MG tablet Take 1,000 mg by mouth daily.  Marland Kitchen atorvastatin (LIPITOR) 20 MG tablet TAKE 1 TABLET BY MOUTH  DAILY AT 6 PM.  . b complex vitamins tablet Take 1 tablet by mouth daily.  . Blood Pressure Monitoring (SM WRIST CUFF BP MONITOR) MISC 1 Units by Does not apply route 2 (two) times a week.  Marland Kitchen buPROPion (WELLBUTRIN XL) 150 MG 24 hr tablet Take 150 mg by mouth 3 (three) times daily.   . cyanocobalamin 1000 MCG tablet Take 1,000 mcg by mouth daily.  . ferrous sulfate 325 (65 FE) MG tablet Take 325 mg by mouth daily with breakfast.  . Flaxseed,  Linseed, (FLAX SEEDS PO) Take by mouth.  . Iron Combinations (CHROMAGEN PO) Take by mouth.  . levothyroxine (SYNTHROID, LEVOTHROID) 75 MCG tablet TAKE 1 TABLET BY MOUTH  DAILY BEFORE BREAKFAST  . lisinopril-hydrochlorothiazide (PRINZIDE,ZESTORETIC) 10-12.5 MG tablet Take 1 tablet by mouth daily.  . magnesium 30 MG tablet Take 30 mg by mouth 2 (two) times daily.  . mirtazapine (REMERON) 30 MG tablet Take 30 mg by mouth at bedtime.   . Multiple Vitamin (MULTIVITAMIN WITH MINERALS) TABS tablet Take 1 tablet by mouth daily.  Marland Kitchen terazosin (HYTRIN) 5 MG capsule TAKE 1 CAPSULE BY MOUTH ONCE DAILY  . traZODone (DESYREL) 150 MG tablet Take 150 mg by mouth at bedtime.    No facility-administered encounter medications on file as of 04/05/2017.     Activities of Daily Living In your present state of health, do you have any difficulty performing the following activities: 04/05/2017 12/29/2016  Hearing? Tempie Donning  Vision? N N  Difficulty concentrating or making decisions? N N  Walking or climbing stairs? N N  Dressing or bathing? N N  Doing errands, shopping? Y N  Preparing Food and eating ? N -  Using the Toilet? N -  In the past six months, have you accidently leaked urine? N -  Do you have problems with loss of bowel control? N -  Managing your Medications? N -  Managing your Finances? N -  Housekeeping or managing your Housekeeping? N -  Some recent data might be hidden    Patient Care Team: Mikey College, NP as PCP - General (Nurse Practitioner) Myer Haff, MD as Referring Physician (Psychiatry) Corrie Dandy, LCSW as Social Worker (Psychiatry)   Assessment:     Exercise Activities and Dietary recommendations Current Exercise Habits: Home exercise routine, Type of exercise: walking;strength training/weights, Time (Minutes): 60, Frequency (Times/Week): 2, Weekly Exercise (Minutes/Week): 120, Intensity: Mild, Exercise limited by: orthopedic condition(s)  Goals    . Exercise 3x per week  (30 min per time)          Patient would like to lose 30 pounds over the next year. He will increase exercise (walking).    . Increase water intake          Recommend drinking at least 6-8 glasses of water a day     . Reduce calorie intake to 2000 calories per day          Reduce calories from sugar sweetened beverages and concentrated sweets.     . Reduced depressive symptoms. (pt-stated)          Pt would like to work with his psychiatrist to reduce depressive symptoms.     . Weight < 200 lb (90.719 kg)          Wants to loss weight and having less depression      Fall Risk Fall Risk  04/05/2017 12/29/2016 08/17/2016 05/13/2016 11/24/2015  Falls in the past year? No No No No No   Depression Screen PHQ 2/9 Scores 04/05/2017 12/29/2016 08/17/2016 05/13/2016  PHQ - 2 Score 0 0 0 0  PHQ- 9 Score - - - -    Cognitive Function MMSE - Mini Mental State Exam 05/13/2016 04/14/2015  Orientation to time 5 5  Orientation to Place 5 5  Registration 3 3  Attention/ Calculation 5 5  Recall 3 3  Language- name 2 objects 2 2  Language- repeat 1 1  Language- follow 3 step command 3 3  Language- read & follow direction 1 1  Write a sentence 1 1  Copy design 1 1  Total score 30 30     6CIT Screen 04/05/2017  What Year? 0 points  What month? 0 points  What time? 0 points  Count back from 20 0 points  Months in reverse 0 points  Repeat phrase 2 points  Total Score 2    Immunization History  Administered Date(s) Administered  . Influenza, High Dose Seasonal PF 07/18/2015, 06/23/2016  . Influenza-Unspecified 06/13/2014  . Pneumococcal Conjugate-13 03/13/2014  . Pneumococcal Polysaccharide-23 09/13/2012  . Zoster 09/13/2012   Screening Tests Health Maintenance  Topic Date Due  . INFLUENZA VACCINE  04/13/2017  . FOOT EXAM  05/13/2017  . HEMOGLOBIN A1C  06/30/2017  . OPHTHALMOLOGY EXAM  07/20/2017  . COLONOSCOPY  09/13/2024  . TETANUS/TDAP  04/16/2025  . PNA vac Low Risk Adult   Completed      Plan:    I have personally reviewed and addressed the Medicare Annual Wellness questionnaire and have noted the following in the patient's chart:  A. Medical and social history B. Use of alcohol, tobacco or illicit drugs  C. Current medications and supplements D. Functional ability and status E.  Nutritional status F.  Physical activity G. Advance directives H. List  of other physicians I.  Hospitalizations, surgeries, and ER visits in previous 12 months J.  [place] such as hearing and vision if needed, cognitive and depression L. Referrals and appointments   In addition, I have reviewed and discussed with patient certain preventive protocols, quality metrics, and best practice recommendations. A written personalized care plan for preventive services as well as general preventive health recommendations were provided to patient.   Signed,  Tyler Aas, LPN Nurse Health Advisor   MD Recommendations: none

## 2017-04-05 NOTE — Patient Instructions (Addendum)
[name] , Thank you for taking time to come for your Medicare Wellness Visit. I appreciate your ongoing commitment to your health goals. Please review the following plan we discussed and let me know if I can assist you in the future.   Screening recommendations/referrals: Colonoscopy: Completed 09/13/2014 Recommended yearly ophthalmology/optometry visit for glaucoma screening and checkup Recommended yearly dental visit for hygiene and checkup  Vaccinations: Influenza vaccine:  Up to date, due 06/2017 Pneumococcal vaccine: up to date Tdap vaccine: up to date  Shingles vaccine: up to date  Advanced directives: Advance directive discussed with you today. I have provided a copy for you to complete at home and have notarized. Once this is complete please bring a copy in to our office so we can scan it into your chart.  Conditions/risks identified: Recommend drinking at least 6-8 glasses of water a day   Next appointment: Follow up on 04/07/2017 at 8:20am with Lissa Merlin. Follow up in one year for your annual wellness exam.  Preventive Care 65 Years and Older, Male Preventive care refers to lifestyle choices and visits with your health care provider that can promote health and wellness. What does preventive care include?  A yearly physical exam. This is also called an annual well check.  Dental exams once or twice a year.  Routine eye exams. Ask your health care provider how often you should have your eyes checked.  Personal lifestyle choices, including:  Daily care of your teeth and gums.  Regular physical activity.  Eating a healthy diet.  Avoiding tobacco and drug use.  Limiting alcohol use.  Practicing safe sex.  Taking low doses of aspirin every day.  Taking vitamin and mineral supplements as recommended by your health care provider. What happens during an annual well check? The services and screenings done by your health care provider during your annual well  check will depend on your age, overall health, lifestyle risk factors, and family history of disease. Counseling  Your health care provider may ask you questions about your:  Alcohol use.  Tobacco use.  Drug use.  Emotional well-being.  Home and relationship well-being.  Sexual activity.  Eating habits.  History of falls.  Memory and ability to understand (cognition).  Work and work Statistician. Screening  You may have the following tests or measurements:  Height, weight, and BMI.  Blood pressure.  Lipid and cholesterol levels. These may be checked every 5 years, or more frequently if you are over 2 years old.  Skin check.  Lung cancer screening. You may have this screening every year starting at age 29 if you have a 30-pack-year history of smoking and currently smoke or have quit within the past 15 years.  Fecal occult blood test (FOBT) of the stool. You may have this test every year starting at age 24.  Flexible sigmoidoscopy or colonoscopy. You may have a sigmoidoscopy every 5 years or a colonoscopy every 10 years starting at age 76.  Prostate cancer screening. Recommendations will vary depending on your family history and other risks.  Hepatitis C blood test.  Hepatitis B blood test.  Sexually transmitted disease (STD) testing.  Diabetes screening. This is done by checking your blood sugar (glucose) after you have not eaten for a while (fasting). You may have this done every 1-3 years.  Abdominal aortic aneurysm (AAA) screening. You may need this if you are a current or former smoker.  Osteoporosis. You may be screened starting at age 57 if you are at  high risk. Talk with your health care provider about your test results, treatment options, and if necessary, the need for more tests. Vaccines  Your health care provider may recommend certain vaccines, such as:  Influenza vaccine. This is recommended every year.  Tetanus, diphtheria, and acellular  pertussis (Tdap, Td) vaccine. You may need a Td booster every 10 years.  Zoster vaccine. You may need this after age 53.  Pneumococcal 13-valent conjugate (PCV13) vaccine. One dose is recommended after age 18.  Pneumococcal polysaccharide (PPSV23) vaccine. One dose is recommended after age 41. Talk to your health care provider about which screenings and vaccines you need and how often you need them. This information is not intended to replace advice given to you by your health care provider. Make sure you discuss any questions you have with your health care provider. Document Released: 09/26/2015 Document Revised: 05/19/2016 Document Reviewed: 07/01/2015 Elsevier Interactive Patient Education  2017 [place] Prevention in the Home Falls can cause injuries. They can happen to people of all ages. There are many things you can do to make your home safe and to help prevent falls. What can I do on the outside of my home?  Regularly fix the edges of walkways and driveways and fix any cracks.  Remove anything that might make you trip as you walk through a door, such as a raised step or threshold.  Trim any bushes or trees on the path to your home.  Use bright outdoor lighting.  Clear any walking paths of anything that might make someone trip, such as rocks or tools.  Regularly check to see if handrails are loose or broken. Make sure that both sides of any steps have handrails.  Any raised decks and porches should have guardrails on the edges.  Have any leaves, snow, or ice cleared regularly.  Use sand or salt on walking paths during winter.  Clean up any spills in your garage right away. This includes oil or grease spills. What can I do in the bathroom?  Use night lights.  Install grab bars by the toilet and in the tub and shower. Do not use towel bars as grab bars.  Use non-skid mats or decals in the tub or shower.  If you need to sit down in the shower, use a plastic,  non-slip stool.  Keep the floor dry. Clean up any water that spills on the floor as soon as it happens.  Remove soap buildup in the tub or shower regularly.  Attach bath mats securely with double-sided non-slip rug tape.  Do not have throw rugs and other things on the floor that can make you trip. What can I do in the bedroom?  Use night lights.  Make sure that you have a light by your bed that is easy to reach.  Do not use any sheets or blankets that are too big for your bed. They should not hang down onto the floor.  Have a firm chair that has side arms. You can use this for support while you get dressed.  Do not have throw rugs and other things on the floor that can make you trip. What can I do in the kitchen?  Clean up any spills right away.  Avoid walking on wet floors.  Keep items that you use a lot in easy-to-reach places.  If you need to reach something above you, use a strong step stool that has a grab bar.  Keep electrical cords out of the  way.  Do not use floor polish or wax that makes floors slippery. If you must use wax, use non-skid floor wax.  Do not have throw rugs and other things on the floor that can make you trip. What can I do with my stairs?  Do not leave any items on the stairs.  Make sure that there are handrails on both sides of the stairs and use them. Fix handrails that are broken or loose. Make sure that handrails are as long as the stairways.  Check any carpeting to make sure that it is firmly attached to the stairs. Fix any carpet that is loose or worn.  Avoid having throw rugs at the top or bottom of the stairs. If you do have throw rugs, attach them to the floor with carpet tape.  Make sure that you have a light switch at the top of the stairs and the bottom of the stairs. If you do not have them, ask someone to add them for you. What else can I do to help prevent falls?  Wear shoes that:  Do not have high heels.  Have rubber  bottoms.  Are comfortable and fit you well.  Are closed at the toe. Do not wear sandals.  If you use a stepladder:  Make sure that it is fully opened. Do not climb a closed stepladder.  Make sure that both sides of the stepladder are locked into place.  Ask someone to hold it for you, if possible.  Clearly mark and make sure that you can see:  Any grab bars or handrails.  First and last steps.  Where the edge of each step is.  Use tools that help you move around (mobility aids) if they are needed. These include:  Canes.  Walkers.  Scooters.  Crutches.  Turn on the lights when you go into a dark area. Replace any light bulbs as soon as they burn out.  Set up your furniture so you have a clear path. Avoid moving your furniture around.  If any of your floors are uneven, fix them.  If there are any pets around you, be aware of where they are.  Review your medicines with your doctor. Some medicines can make you feel dizzy. This can increase your chance of falling. Ask your doctor what other things that you can do to help prevent falls. This information is not intended to replace advice given to you by your health care provider. Make sure you discuss any questions you have with your health care provider. Document Released: 06/26/2009 Document Revised: 02/05/2016 Document Reviewed: 10/04/2014 Elsevier Interactive Patient Education  2017 [employer].

## 2017-04-07 ENCOUNTER — Encounter: Payer: Self-pay | Admitting: Nurse Practitioner

## 2017-04-07 ENCOUNTER — Ambulatory Visit (INDEPENDENT_AMBULATORY_CARE_PROVIDER_SITE_OTHER): Payer: Medicare Other | Admitting: Nurse Practitioner

## 2017-04-07 VITALS — BP 116/80 | HR 92 | Temp 98.6°F | Ht 68.0 in | Wt 228.0 lb

## 2017-04-07 DIAGNOSIS — I10 Essential (primary) hypertension: Secondary | ICD-10-CM | POA: Diagnosis not present

## 2017-04-07 DIAGNOSIS — E782 Mixed hyperlipidemia: Secondary | ICD-10-CM

## 2017-04-07 DIAGNOSIS — E039 Hypothyroidism, unspecified: Secondary | ICD-10-CM | POA: Diagnosis not present

## 2017-04-07 DIAGNOSIS — R2689 Other abnormalities of gait and mobility: Secondary | ICD-10-CM | POA: Diagnosis not present

## 2017-04-07 NOTE — Progress Notes (Signed)
Subjective:    Patient ID: Joe Hardy, male    DOB: 1941/03/01, 76 y.o.   MRN: [id number]  Joe Hardy is a 76 y.o. male presenting on 04/07/2017 for Hypertension (f/u )   HPI Hypertension He is not checking BP at home.     Current medications: lisinopril - hydrochlorothiazide 10-12.5 mg once daily, tolerating well without side effects  Pt denies headache, lightheadedness, dizziness, changes in vision, chest tightness/pressure, palpitations, leg swelling, sudden loss of speech or loss of consciousness.   Cholesterol  Pt is on atorvastatin 20 mg once daily and is tolerating well without side effects of myalgias or cognitive impairment.  He is working to eat better foods and prepare food at home. He has not increased his physical activity at this time.   Thyroid Had not been taking levothyroxine in morning. Has changed to take correctly about 3 days ago after a nurse from insurance company had done a screening  Pt states he is now taking his levothyroxine in the am at least 1 hour before eating or drinking and taking other medicines.  He deneis heart racing, heart palpitations, heat and cold intolerance, changes in hair/skin/nails, and lower leg swelling. He does not have any compressive symptoms to include difficulty swallowing, globus sensation, or difficulty breathing when lying flat.   Balance Pt w/ remote injury and poor balance with return of most functioning, but pt states he has wider gait stance and notices trazodone makes balance significantly worse when he waits too long to go to bed. May consider home balance PT in future.   Social History  Substance Use Topics  . Smoking status: Former Smoker    Packs/day: 1.50    Years: 20.00    Types: Cigarettes  . Smokeless tobacco: Never Used     Comment: Starts and stops related to anxiety rather than drinking alcohol  . 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)    Review of Systems Per  HPI unless specifically indicated above     Objective:    BP 116/80 (BP Location: Right Arm, Patient Position: Sitting, Cuff Size: Large)   Pulse 92   Temp 98.6 F (37 C) (Oral)   Ht 5\' 8"  (1.727 m)   Wt 228 lb (103.4 kg)   BMI 34.67 kg/m   Wt Readings from Last 3 Encounters:  04/07/17 228 lb (103.4 kg)  04/05/17 227 lb 9.6 oz (103.2 kg)  12/29/16 226 lb (102.5 kg)    Physical Exam General - obese, well-appearing, NAD HEENT - Normocephalic, atraumatic Neck - supple, non-tender, no LAD, no carotid bruit Heart - RRR, no murmurs heard Lungs - Clear throughout all lobes, no wheezing, crackles, or rhonchi. Normal work of breathing. Extremeties - non-tender, no edema, cap refill < 2 seconds, peripheral pulses intact +2 bilaterally Skin - warm, dry Neuro - awake, alert, oriented x3, poor coordination w/ positive heel-toe walking (pt wobbles, but is able to use arms to find balance without fall), positive romberg, wide-stance gait.  Timed up and go test: 8 seconds w/o hesitations or difficulty completing test (normal finding at low risk for falls). Psych - Normal mood and affect, normal behavior     Results for orders placed or performed in visit on 12/29/16  Hemoglobin A1c  Result Value Ref Range   Hgb A1c MFr Bld 5.5 <5.7 %   Mean Plasma Glucose 111 mg/dL  COMPLETE METABOLIC PANEL WITH GFR  Result Value Ref Range   Sodium  135 135 - 146 mmol/L   Potassium 4.3 3.5 - 5.3 mmol/L   Chloride 102 98 - 110 mmol/L   CO2 23 20 - 31 mmol/L   Glucose, Bld 109 (H) 65 - 99 mg/dL   BUN 12 7 - 25 mg/dL   Creat 0.95 0.70 - 1.18 mg/dL   Total Bilirubin 0.4 0.2 - 1.2 mg/dL   Alkaline Phosphatase 53 40 - 115 U/L   AST 22 10 - 35 U/L   ALT 23 9 - 46 U/L   Total Protein 6.8 6.1 - 8.1 g/dL   Albumin 4.3 3.6 - 5.1 g/dL   Calcium 9.7 8.6 - 10.3 mg/dL   GFR, Est African American >89 >=60 mL/min   GFR, Est Non African American 78 >=60 mL/min      Assessment & Plan:   Problem List Items  Addressed This Visit      Cardiovascular and Mediastinum   BP (high blood pressure)    Controlled and stable on medication.  Pt working to improve current diet.  Plan: 1. Continue lisinopril-hydrochlorothaizide 10-12.5 mg once daily. 2. Encouraged increasing physical activity to 30 minutes most days of the week. 3. Encouraged DASH diet. 4. Follow up 3 months      Relevant Orders   Basic metabolic panel     Endocrine   Hypothyroidism - Primary    Previously stable.  Pt w/ recent change in medication dosing regimen to correct administration regimen.  Plan: 1. Encouraged continued adherence to proper administration. 2. Request pt to have TSH collected in 6 weeks.  May need dose adjustment at that time. 3. Follow up 3 months.      Relevant Orders   TSH     Other   Mixed hyperlipidemia    Previous labs indicated hypercholesterolemia is controlled. Pt tolerating atorvastatin well.  Plan: 1. Continue atorvastatin 20 mg once daily. 2. Encouraged DASH diet/ heart healthy diet. 3. Encouraged physical activity. 4. Follow up w/ labs in 6 weeks at [facility] collection. Pt requested to be fasting. 5. Follow up 3 months.      Relevant Orders   Lipid panel   Poor balance    Pt w/ remote injury and wide stance gait.  Coordination impaired for lower extremities only.  Rapid arm movements normal.  Normal timed up and go test indicates low risk for falls.  Pt on trazodone at night that significantly increases risk for fall.  Plan: 1. Encouraged low intensity balance exercises using chair and standing on one foot with other foot slightly off floor. 2. Encouraged pt to consider putting a chair in his bedroom so less chance to fall.  Also to go to bed 10 minutes after taking trazodone instead of 30-45 minutes later. 3. Consider PT referral in future - home health r/t no pt transportation. 4. Follow up 3 months.             Follow up plan: Return in about 3 months (around 07/08/2017) for  blood pressure, obesity.  Cassell Smiles, DNP, AGPCNP-BC Adult Gerontology Primary Care Nurse Practitioner [facility] 04/11/2017, 8:30 AM

## 2017-04-07 NOTE — Patient Instructions (Addendum)
Joe Hardy, Thank you for coming in to clinic today.  1. For your blood pressure, - NO changes in your medications today. - Eat a heart healthy diet, reduce your overall servings.  2. For your thyroid, - Remember to take your levothyroxine first thing in the morning at least 30 minutes to one hour before eating or drinking anything other than water.  3. For your balance, - hold the back of a chair and work on standing on one leg with the support of your chair.  Put your leg just above the floor in front of you, behind you, or out to the side.  Hold your leg up as long as you can from 2-3 seconds up to 30 seconds and repeat 4-5 times and do this once or twice daily.  Please schedule a follow-up appointment with Cassell Smiles, AGNP. Return in about 3 months (around 07/08/2017) for blood pressure, obesity.  If you have any other questions or concerns, please feel free to call the clinic or send a message through [place]. You may also schedule an earlier appointment if necessary.  You will receive a survey after today's visit either digitally by e-mail or paper by [employer]. Your experiences and feedback matter to [place].  Please respond so we know how we are doing as we provide care for you.   Cassell Smiles, DNP, AGNP-BC Adult Gerontology Nurse Practitioner [facility]   DASH Eating Plan DASH stands for "Dietary Approaches to Stop Hypertension." The DASH eating plan is a healthy eating plan that has been shown to reduce high blood pressure (hypertension). It may also reduce your risk for type 2 diabetes, heart disease, and stroke. The DASH eating plan may also help with weight loss. What are tips for following this plan? General guidelines  Avoid eating more than 2,300 mg (milligrams) of salt (sodium) a day. If you have hypertension, you may need to reduce your sodium intake to 1,500 mg a day.  Limit alcohol intake to no more than 1 drink a day for nonpregnant women and 2  drinks a day for men. One drink equals 12 oz of beer, 5 oz of wine, or 1 oz of hard liquor.  Work with your health care provider to maintain a healthy body weight or to lose weight. Ask what an ideal weight is for you.  Get at least 30 minutes of exercise that causes your heart to beat faster (aerobic exercise) most days of the week. Activities may include walking, swimming, or biking.  Work with your health care provider or diet and nutrition specialist (dietitian) to adjust your eating plan to your individual calorie needs. Reading food labels  Check food labels for the amount of sodium per serving. Choose foods with less than 5 percent of the Daily Value of sodium. Generally, foods with less than 300 mg of sodium per serving fit into this eating plan.  To find whole grains, look for the word "whole" as the first word in the ingredient list. Shopping  Buy products labeled as "low-sodium" or "no salt added."  Buy fresh foods. Avoid canned foods and premade or frozen meals. Cooking  Avoid adding salt when cooking. Use salt-free seasonings or herbs instead of table salt or sea salt. Check with your health care provider or pharmacist before using salt substitutes.  Do not fry foods. Cook foods using healthy methods such as baking, boiling, grilling, and broiling instead.  Cook with heart-healthy oils, such as olive, canola, soybean, or sunflower  oil. Meal planning   Eat a balanced diet that includes: ? 5 or more servings of fruits and vegetables each day. At each meal, try to fill half of your plate with fruits and vegetables. ? Up to 6-8 servings of whole grains each day. ? Less than 6 oz of lean meat, poultry, or fish each day. A 3-oz serving of meat is about the same size as a deck of cards. One egg equals 1 oz. ? 2 servings of low-fat dairy each day. ? A serving of nuts, seeds, or beans 5 times each week. ? Heart-healthy fats. Healthy fats called Omega-3 fatty acids are found in  foods such as flaxseeds and coldwater fish, like sardines, salmon, and mackerel.  Limit how much you eat of the following: ? Canned or prepackaged foods. ? Food that is high in trans fat, such as fried foods. ? Food that is high in saturated fat, such as fatty meat. ? Sweets, desserts, sugary drinks, and other foods with added sugar. ? Full-fat dairy products.  Do not salt foods before eating.  Try to eat at least 2 vegetarian meals each week.  Eat more home-cooked food and less restaurant, buffet, and fast food.  When eating at a restaurant, ask that your food be prepared with less salt or no salt, if possible. What foods are recommended? The items listed may not be a complete list. Talk with your dietitian about what dietary choices are best for you. Grains Whole-grain or whole-wheat bread. Whole-grain or whole-wheat pasta. Brown rice. [place]. Bulgur. Whole-grain and low-sodium cereals. Pita bread. Low-fat, low-sodium crackers. Whole-wheat flour tortillas. Vegetables Fresh or frozen vegetables (raw, steamed, roasted, or grilled). Low-sodium or reduced-sodium tomato and vegetable juice. Low-sodium or reduced-sodium tomato sauce and tomato paste. Low-sodium or reduced-sodium canned vegetables. Fruits All fresh, dried, or frozen fruit. Canned fruit in natural juice (without added sugar). Meat and other protein foods Skinless chicken or [place]. Ground chicken or [place]. Pork with fat trimmed off. Fish and seafood. Egg whites. Dried beans, peas, or lentils. Unsalted nuts, nut butters, and seeds. Unsalted canned beans. Lean cuts of beef with fat trimmed off. Low-sodium, lean deli meat. Dairy Low-fat (1%) or fat-free (skim) milk. Fat-free, low-fat, or reduced-fat cheeses. Nonfat, low-sodium ricotta or cottage cheese. Low-fat or nonfat yogurt. Low-fat, low-sodium cheese. Fats and oils Soft margarine without trans fats. Vegetable oil. Low-fat, reduced-fat, or light mayonnaise and salad  dressings (reduced-sodium). Canola, safflower, olive, soybean, and sunflower oils. Avocado. Seasoning and other foods Herbs. Spices. Seasoning mixes without salt. Unsalted popcorn and pretzels. Fat-free sweets. What foods are not recommended? The items listed may not be a complete list. Talk with your dietitian about what dietary choices are best for you. Grains Baked goods made with fat, such as croissants, muffins, or some breads. Dry pasta or rice meal packs. Vegetables Creamed or fried vegetables. Vegetables in a cheese sauce. Regular canned vegetables (not low-sodium or reduced-sodium). Regular canned tomato sauce and paste (not low-sodium or reduced-sodium). Regular tomato and vegetable juice (not low-sodium or reduced-sodium). Angie Fava. Olives. Fruits Canned fruit in a light or heavy syrup. Fried fruit. Fruit in cream or butter sauce. Meat and other protein foods Fatty cuts of meat. Ribs. Fried meat. Berniece Salines. Sausage. Bologna and other processed lunch meats. Salami. Fatback. Hotdogs. Bratwurst. Salted nuts and seeds. Canned beans with added salt. Canned or smoked fish. Whole eggs or egg yolks. Chicken or [place] with skin. Dairy Whole or 2% milk, cream, and half-and-half. Whole or full-fat cream  cheese. Whole-fat or sweetened yogurt. Full-fat cheese. Nondairy creamers. Whipped toppings. Processed cheese and cheese spreads. Fats and oils Butter. Stick margarine. Lard. Shortening. Ghee. Bacon fat. Tropical oils, such as coconut, palm kernel, or palm oil. Seasoning and other foods Salted popcorn and pretzels. Onion salt, garlic salt, seasoned salt, table salt, and sea salt. Worcestershire sauce. Tartar sauce. Barbecue sauce. Teriyaki sauce. Soy sauce, including reduced-sodium. Steak sauce. Canned and packaged gravies. Fish sauce. Oyster sauce. Cocktail sauce. Horseradish that you find on the shelf. Ketchup. Mustard. Meat flavorings and tenderizers. Bouillon cubes. Hot sauce and Tabasco sauce.  Premade or packaged marinades. Premade or packaged taco seasonings. Relishes. Regular salad dressings. Where to find more information:  National Heart, Lung, and [place]: https://wilson-e[place].com/  American Heart Association: www.heart.org Summary  The DASH eating plan is a healthy eating plan that has been shown to reduce high blood pressure (hypertension). It may also reduce your risk for type 2 diabetes, heart disease, and stroke.  With the DASH eating plan, you should limit salt (sodium) intake to 2,300 mg a day. If you have hypertension, you may need to reduce your sodium intake to 1,500 mg a day.  When on the DASH eating plan, aim to eat more fresh fruits and vegetables, whole grains, lean proteins, low-fat dairy, and heart-healthy fats.  Work with your health care provider or diet and nutrition specialist (dietitian) to adjust your eating plan to your individual calorie needs. This information is not intended to replace advice given to you by your health care provider. Make sure you discuss any questions you have with your health care provider. Document Released: 08/19/2011 Document Revised: 08/23/2016 Document Reviewed: 08/23/2016 Elsevier Interactive Patient Education  2017 [employer].

## 2017-04-11 DIAGNOSIS — R2689 Other abnormalities of gait and mobility: Secondary | ICD-10-CM | POA: Insufficient documentation

## 2017-04-11 NOTE — Assessment & Plan Note (Signed)
Controlled and stable on medication.  Pt working to improve current diet.  Plan: 1. Continue lisinopril-hydrochlorothaizide 10-12.5 mg once daily. 2. Encouraged increasing physical activity to 30 minutes most days of the week. 3. Encouraged DASH diet. 4. Follow up 3 months

## 2017-04-11 NOTE — Assessment & Plan Note (Signed)
Pt w/ remote injury and wide stance gait.  Coordination impaired for lower extremities only.  Rapid arm movements normal.  Normal timed up and go test indicates low risk for falls.  Pt on trazodone at night that significantly increases risk for fall.  Plan: 1. Encouraged low intensity balance exercises using chair and standing on one foot with other foot slightly off floor. 2. Encouraged pt to consider putting a chair in his bedroom so less chance to fall.  Also to go to bed 10 minutes after taking trazodone instead of 30-45 minutes later. 3. Consider PT referral in future - home health r/t no pt transportation. 4. Follow up 3 months.

## 2017-04-11 NOTE — Assessment & Plan Note (Signed)
Previously stable.  Pt w/ recent change in medication dosing regimen to correct administration regimen.  Plan: 1. Encouraged continued adherence to proper administration. 2. Request pt to have TSH collected in 6 weeks.  May need dose adjustment at that time. 3. Follow up 3 months.

## 2017-04-11 NOTE — Progress Notes (Signed)
I have reviewed this encounter including the documentation in this note and/or discussed this patient with the provider, Cassell Smiles, AGPCNP-BC. I am certifying that I agree with the content of this note as supervising physician.  Nobie Putnam, [facility] 04/11/2017, 11:49 AM

## 2017-04-11 NOTE — Assessment & Plan Note (Signed)
Previous labs indicated hypercholesterolemia is controlled. Pt tolerating atorvastatin well.  Plan: 1. Continue atorvastatin 20 mg once daily. 2. Encouraged DASH diet/ heart healthy diet. 3. Encouraged physical activity. 4. Follow up w/ labs in 6 weeks at [facility] collection. Pt requested to be fasting. 5. Follow up 3 months.

## 2017-04-21 LAB — LIPID PANEL
Chol/HDL Ratio: 4.3 ratio (ref 0.0–5.0)
Cholesterol, Total: 113 mg/dL (ref 100–199)
HDL: 26 mg/dL — ABNORMAL LOW (ref >39)
LDL Calculated: 48 mg/dL (ref 0–99)
Triglycerides: 196 mg/dL — ABNORMAL HIGH (ref 0–149)
VLDL Cholesterol Cal: 39 mg/dL (ref 5–40)

## 2017-04-21 LAB — BASIC METABOLIC PANEL
BUN/Creatinine Ratio: 10 (ref 10–24)
BUN: 9 mg/dL (ref 8–27)
CO2: 22 mmol/L (ref 20–29)
Calcium: 9.4 mg/dL (ref 8.6–10.2)
Chloride: 102 mmol/L (ref 96–106)
Creatinine, Ser: 0.92 mg/dL (ref 0.76–1.27)
GFR calc Af Amer: 94 mL/min/{1.73_m2} (ref >59)
GFR calc non Af Amer: 81 mL/min/{1.73_m2} (ref >59)
Glucose: 98 mg/dL (ref 65–99)
Potassium: 4.1 mmol/L (ref 3.5–5.2)
Sodium: 140 mmol/L (ref 134–144)

## 2017-04-21 LAB — TSH: TSH: 1.85 u[IU]/mL (ref 0.450–4.500)

## 2017-05-17 ENCOUNTER — Encounter: Payer: Self-pay | Admitting: Nurse Practitioner

## 2017-05-26 ENCOUNTER — Ambulatory Visit: Payer: Medicare Other | Admitting: Nurse Practitioner

## 2017-06-13 IMAGING — MR MR HEAD WO/W CM
10 of 11 series · 40 of 48 positions shown · IV contrast (multihance)
Comparison: None.

CLINICAL DATA: Pulsing sound in LEFT ear for 1 year. BILATERAL
hearing loss ongoing.

EXAM:
MRI HEAD WITHOUT AND WITH CONTRAST
TECHNIQUE: Multiplanar, multiecho pulse sequences of the brain and surrounding
structures were obtained without and with intravenous contrast.
CONTRAST:  20mL MULTIHANCE GADOBENATE DIMEGLUMINE 529 MG/ML IV SOLN

[Series 2: T1 · sagittal · 5.0mm · 0.49mm/px · 3 of 25 slices shown (1 of 3)]
[im 1/25]
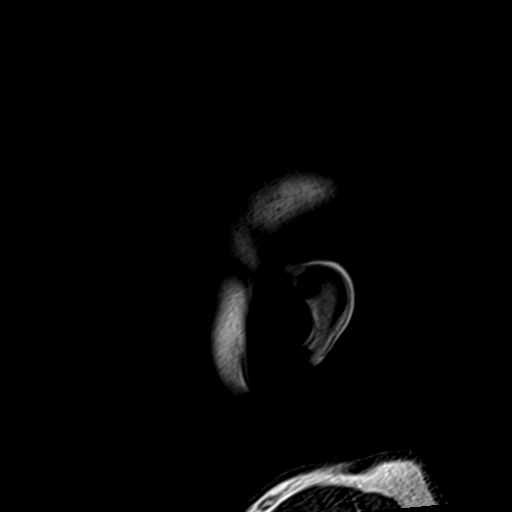
[im 13/25]
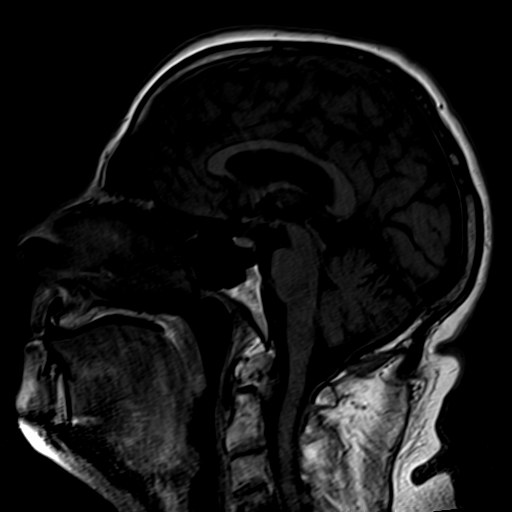
[im 25/25]
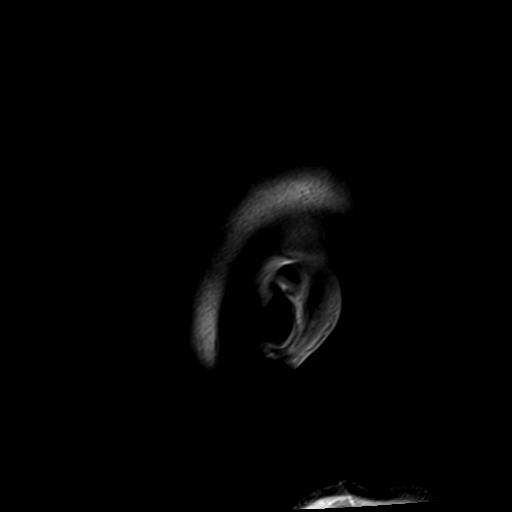

[Series 4: DWI · axial · 3.0mm · 1.30mm/px · z∈[-43,+110]mm · 7 of 55 slices shown (1 of 2)]
[im 1/55]
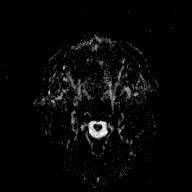
[im 10/55]
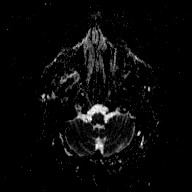
[im 19/55]
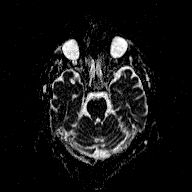
[im 28/55]
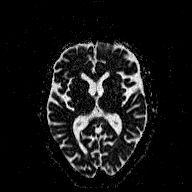
[im 37/55]
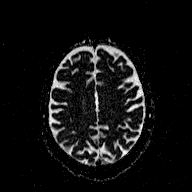
[im 46/55]
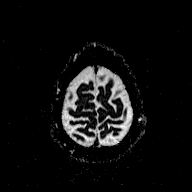
[im 55/55]
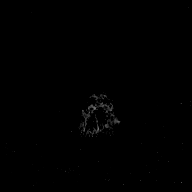

[Series 5: T2 · axial · 5.0mm · 0.78mm/px · z∈[-40,+107]mm · 3 of 25 slices shown]
[im 1/25]
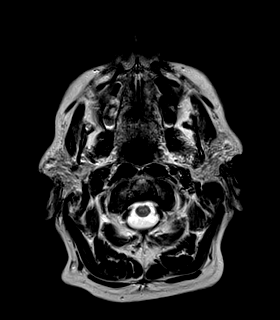
[im 13/25]
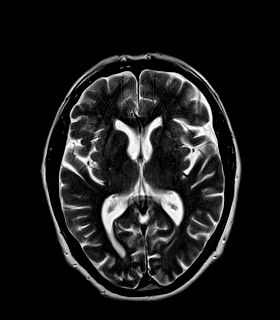
[im 25/25]
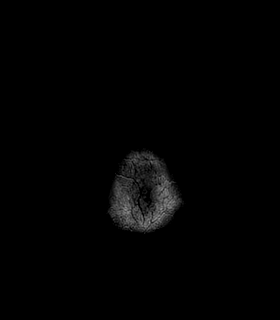

[Series 6: FLAIR · axial · 5.0mm · 0.45mm/px · z∈[-43,+104]mm · 3 of 25 slices shown]
[im 1/25]
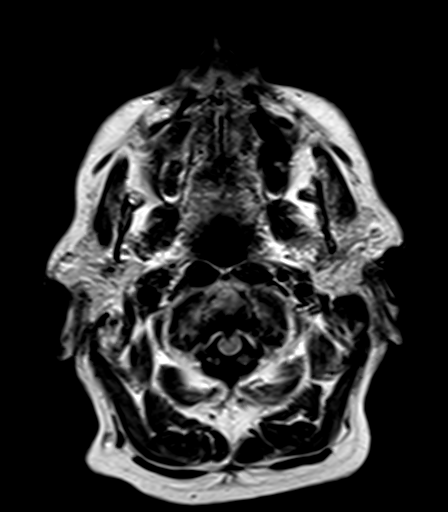
[im 13/25]
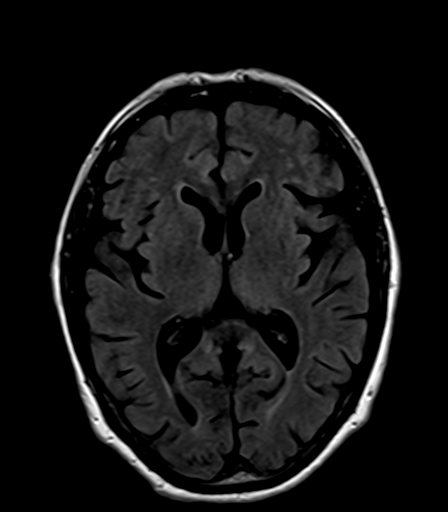
[im 25/25]
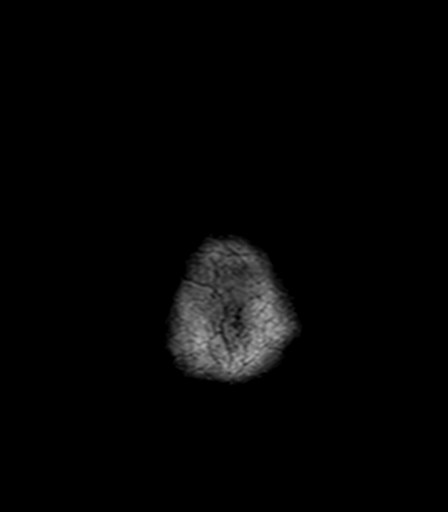

[Series 7: T1 · coronal · 3.0mm · 0.37mm/px · 2 of 13 slices shown (2 of 3)]
[im 1/13]
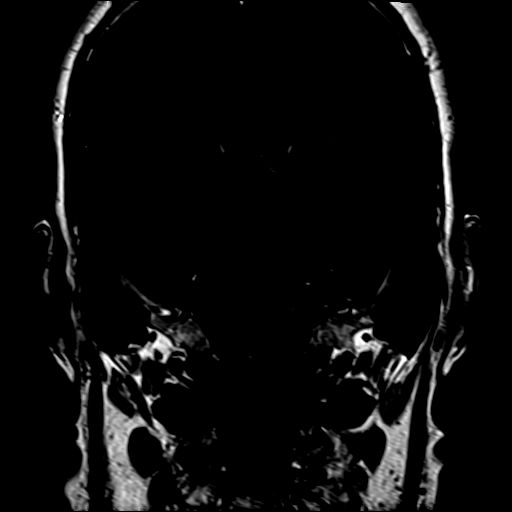
[im 13/13]
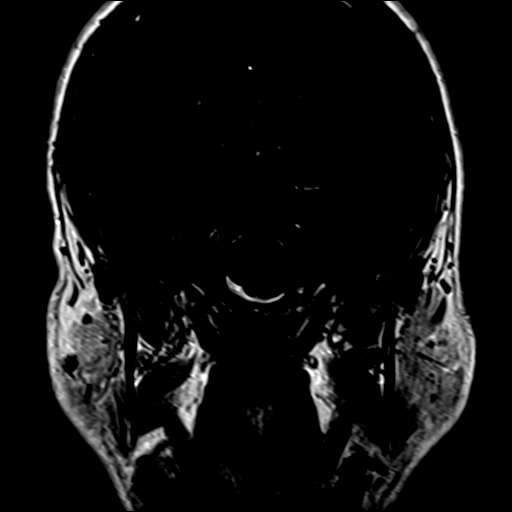

[Series 8: T1 · axial · 3.0mm · 0.37mm/px · z∈[-38,-1]mm · 2 of 13 slices shown (3 of 3)]
[im 1/13]
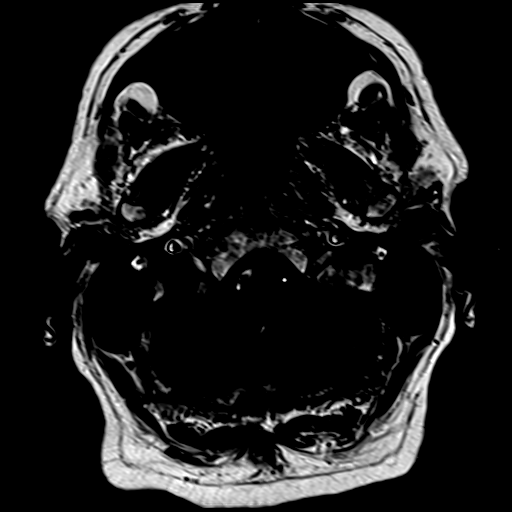
[im 13/13]
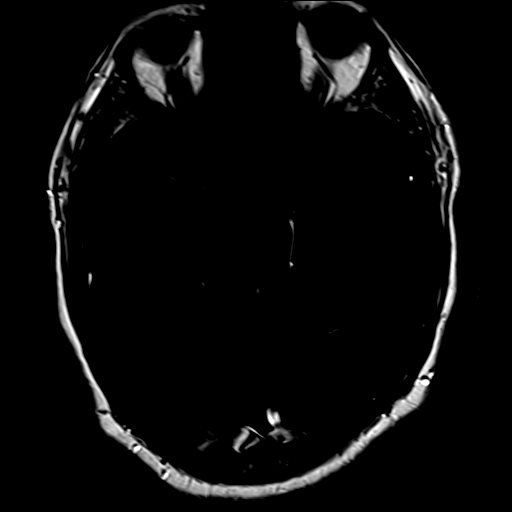

[Series 10: T1 post-contrast · coronal · 3.0mm · 0.37mm/px · 2 of 13 slices shown (1 of 3)]
[im 1/13]
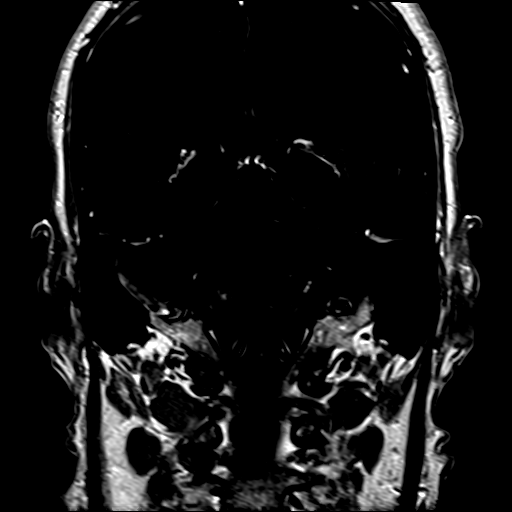
[im 13/13]
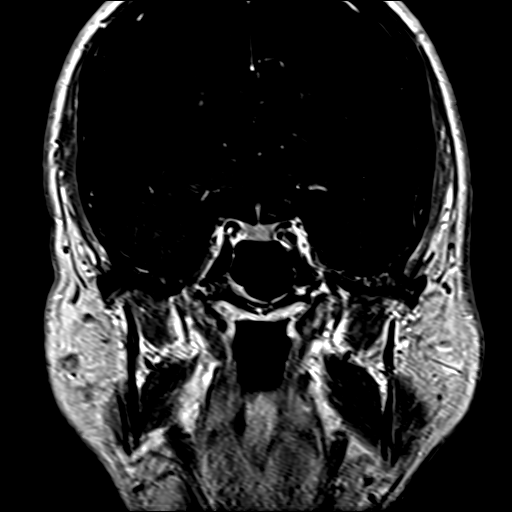

[Series 11: T1 post-contrast · axial · 3.0mm · 0.37mm/px · z∈[-38,-1]mm · 2 of 13 slices shown (2 of 3)]
[im 1/13]
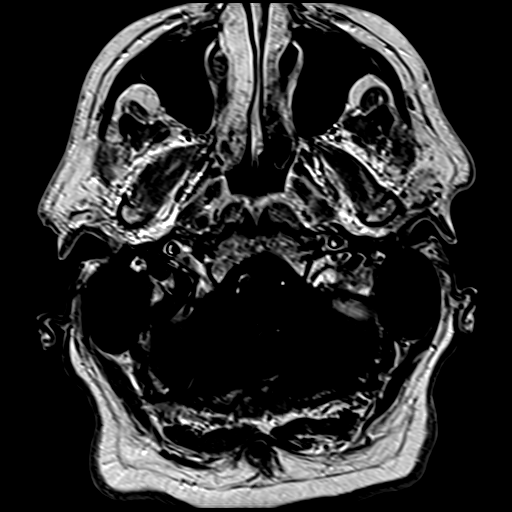
[im 13/13]
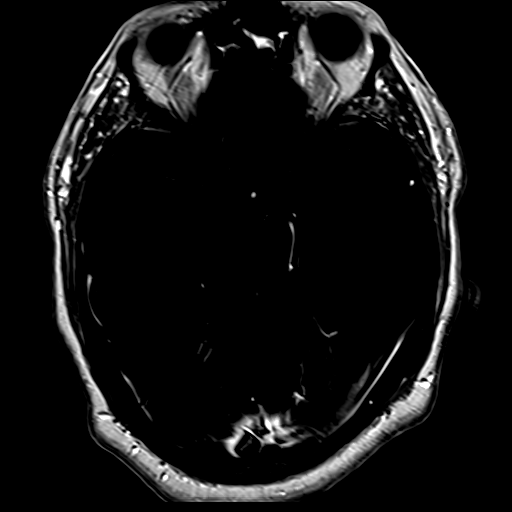

[Series 12: T1 post-contrast · axial · 3.0mm · 1.00mm/px · z∈[-54,+123]mm · 9 of 64 slices shown (3 of 3)]
[im 1/64]
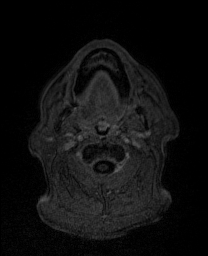
[im 8/64]
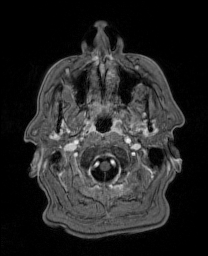
[im 16/64]
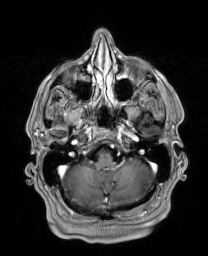
[im 24/64]
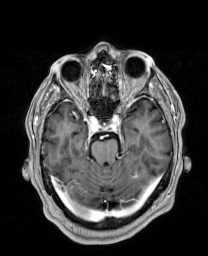
[im 32/64]
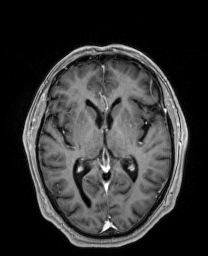
[im 40/64]
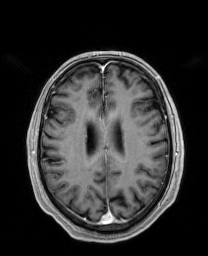
[im 48/64]
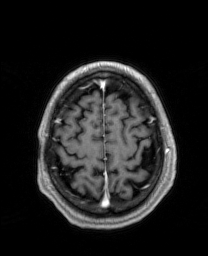
[im 56/64]
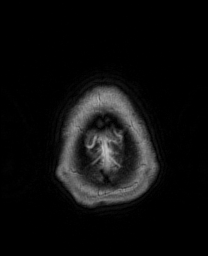
[im 64/64]
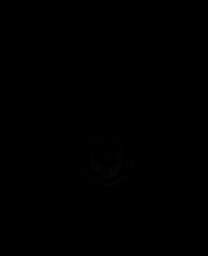

[Series 100: DWI · axial · 3.0mm · 1.30mm/px · z∈[-43,+110]mm · 7 of 55 slices shown (2 of 2)]
[im 1/55]
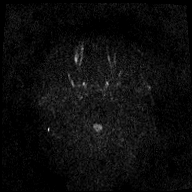
[im 10/55]
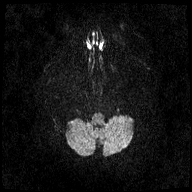
[im 19/55]
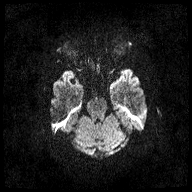
[im 28/55]
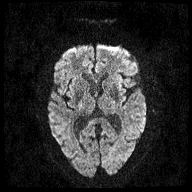
[im 37/55]
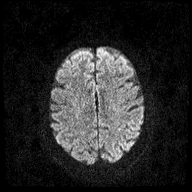
[im 46/55]
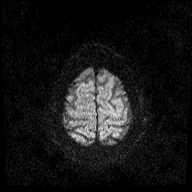
[im 55/55]
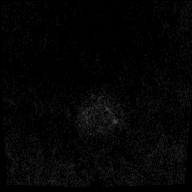

[40 of 48 positions shown; findings below may reference images not displayed]

FINDINGS: No evidence for acute infarction, hemorrhage, mass lesion,
hydrocephalus, or extra-axial fluid. Mild atrophy. Mild chronic
microvascular ischemic change. Scattered chronic lacunar infarcts.

Fell

Thin-section imaging was performed through the posterior fossa. No
evidence for vestibular schwannoma or posterior fossa mass. No
temporal bone inflammatory process. No concerning vascular loop.

Post infusion imaging through the entire head demonstrates no
abnormal enhancement of the brain or meninges.

Flow voids are maintained in the carotid, basilar, and both
vertebral arteries. BILATERAL cataract extraction. No midline
abnormality.
IMPRESSION: Chronic changes as described.  No acute intracranial findings.

No posterior fossa pathology is evident. No cause is seen for
pulsatile tinnitus or hearing loss.

## 2017-07-11 ENCOUNTER — Encounter: Payer: Self-pay | Admitting: Nurse Practitioner

## 2017-07-11 ENCOUNTER — Ambulatory Visit (INDEPENDENT_AMBULATORY_CARE_PROVIDER_SITE_OTHER): Payer: Medicare Other | Admitting: Nurse Practitioner

## 2017-07-11 VITALS — BP 123/76 | Temp 98.1°F | Ht 68.0 in | Wt 227.8 lb

## 2017-07-11 DIAGNOSIS — I10 Essential (primary) hypertension: Secondary | ICD-10-CM

## 2017-07-11 DIAGNOSIS — R0989 Other specified symptoms and signs involving the circulatory and respiratory systems: Secondary | ICD-10-CM

## 2017-07-11 DIAGNOSIS — Z23 Encounter for immunization: Secondary | ICD-10-CM

## 2017-07-11 DIAGNOSIS — R2689 Other abnormalities of gait and mobility: Secondary | ICD-10-CM | POA: Diagnosis not present

## 2017-07-11 NOTE — Patient Instructions (Addendum)
Joe Hardy, Thank you for coming in to clinic today.  1. You are doing very well today! - Continue all medications without changes. - Continue eating healthy foods and your increased physical activity.  Continue your walking and activity even as it gets cooler.  Find things to do indoors if you need to.  2. You will need a carotid ultrasound.  I hear possible blockages of your left carotid artery.  The imaging center will call you to schedule this test.  Please schedule a follow-up appointment with Joe Hardy, AGNP. Return in about 6 months (around 01/09/2018) for Hypertension, obesity, pre-diabetes.  If you have any other questions or concerns, please feel free to call the clinic or send a message through Joe Hardy. You may also schedule an earlier appointment if necessary.  You will receive a survey after today's visit either digitally by e-mail or paper by [employer]. Your experiences and feedback matter to [place].  Please respond so we know how we are doing as we provide care for you.   Joe Smiles, DNP, AGNP-BC Adult Gerontology Nurse Practitioner [place]

## 2017-07-11 NOTE — Progress Notes (Signed)
Subjective:    Patient ID: Joe Hardy, male    DOB: [birth year], 76 y.o.   MRN: [id number]  Joe Hardy is a 76 y.o. male presenting on 07/11/2017 for Hypertension and Weight Check   HPI Insomnia Sleeps well w/ trazodone 150 mg at bedtime, but only when he takes med.  Otherwise, he will not sleep.  Has been more intentional about moving to his bedroom before he gets too sleepy for balance difficulties.  Overall, feels his balance has improved w/ his increased physical activity.  Hypertension - He is not checking BP at home or outside of clinic.    - Current medications: lisinopril-hydrochlorothiazide 10-12.5 mg once daily, tolerating well without side effects - He is not currently symptomatic. - Pt denies headache, lightheadedness, dizziness, changes in vision, chest tightness/pressure, palpitations, leg swelling, sudden loss of speech or loss of consciousness. - He  reports an exercise routine that includes walking, 5-6 days per week. - His diet is reports an exercise routine that includes walking 5-7 days per week.  Diet is moderate in salt, moderate in fat, and moderate in carbohydrates.  Hypothyroidism - Pt states he is taking his levothyroxine 75 mcg in the am at least 1 hour before eating or drinking and taking other medicines.   - He is not currently symptomatic. - He denies, fatigue, excess energy, weight changes, heart racing, heart palpitations, heat and cold intolerance, changes in hair/skin/nails, and lower leg swelling.  - He does not have any compressive symptoms to include difficulty swallowing, globus sensation, or difficulty breathing when lying flat.  Obesity Stable.  Had increased walking to 2 miles per day. Has had set back for last 1.5-2 weeks and has stopped walking r/t neck pain. Has plans to resume activity level.  Knows he has been overeating for last 1.5-2 weeks and needs to cut back, but has done better and felt better since improving diet and increasing  exercise.   Social History  Substance Use Topics  . Smoking status: Former Smoker    Packs/day: 1.50    Years: 20.00    Types: Cigarettes  . Smokeless tobacco: Never Used     Comment: Starts and stops related to anxiety rather than drinking alcohol  . 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 Heart - RRR, no murmurs heard Lungs - Clear throughout all lobes, no wheezing, crackles, or rhonchi. Normal work of breathing. Extremeties - non-tender, no edema, cap refill < 2 seconds, peripheral pulses intact +2 bilaterally Skin - warm, dry Neuro - awake, alert, oriented x3, normal gait Psych - Normal mood and affect, normal behavior   Results for orders placed or performed in visit on 04/07/17  TSH  Result Value Ref Range   TSH 1.850 0.450 -  4.500 uIU/mL  Lipid panel  Result Value Ref Range   Cholesterol, Total 113 100 - 199 mg/dL   Triglycerides 196 (H) 0 - 149 mg/dL   HDL 26 (L) >39 mg/dL   VLDL Cholesterol Cal 39 5 - 40 mg/dL   LDL Calculated 48 0 - 99 mg/dL   Chol/HDL Ratio 4.3 0.0 - 5.0 ratio  Basic metabolic panel  Result Value Ref Range   Glucose 98 65 - 99 mg/dL   BUN 9 8 - 27 mg/dL   Creatinine, Ser 0.92 0.76 - 1.27 mg/dL   GFR calc non Af Amer 81 >59 mL/min/1.73   GFR calc Af Amer 94 >59 mL/min/1.73   BUN/Creatinine Ratio 10 10 - 24   Sodium 140  134 - 144 mmol/L   Potassium 4.1 3.5 - 5.2 mmol/L   Chloride 102 96 - 106 mmol/L   CO2 22 20 - 29 mmol/L   Calcium 9.4 8.6 - 10.2 mg/dL      Assessment & Plan:   Problem List Items Addressed This Visit      Cardiovascular and Mediastinum   Essential hypertension    Controlled and stable on medication.  Pt has improved diet and states he has felt much better.  He has also added regular activity.  Plan: 1. Continue lisinopril-hydrochlorothaizide 10-12.5 mg once daily. 2. Encouraged increased physical activity to 30 minutes most days of the week. 3. Encouraged DASH diet. 4. Follow up 6 months        Other   Poor balance    Pt has improved balance today and more narrow gait than previously.  Pt w/ remote injury and wide stance gait.  Coordination improved, but remains delayed slightly. Pt remains on medications that place him at higher risk for falls.   Plan: 1. Continue increased physical activity and low intensity balance exercises. 2. Follow up 6 months.       Left carotid bruit    New noted left carotid bruit.  Previously evaluated on MRI indicated good flow, but bruit not noted on last exam.  Plan: 1. US carotid arteries. 2. Continue cholesterol management w/ atorvastatin. 3. Follow up as needed after ultrasound.      Relevant Orders   US Carotid Duplex Bilateral    Other Visit Diagnoses    Need for immunization against influenza    -  Primary   Pt desires flu vaccine today.  Age > 64.  Administer high dose fluzone.   Relevant Orders   Flu vaccine HIGH DOSE PF (Fluzone High dose) (Completed)        Follow up plan: Return in about 6 months (around 01/09/2018) for Hypertension, obesity, pre-diabetes.  Cassell Smiles, DNP, AGPCNP-BC Adult Gerontology Primary Care Nurse Practitioner [facility] 07/13/2017, 5:32 PM

## 2017-07-13 NOTE — Assessment & Plan Note (Signed)
Pt has improved balance today and more narrow gait than previously.  Pt w/ remote injury and wide stance gait.  Coordination improved, but remains delayed slightly. Pt remains on medications that place him at higher risk for falls.   Plan: 1. Continue increased physical activity and low intensity balance exercises. 2. Follow up 6 months.

## 2017-07-13 NOTE — Assessment & Plan Note (Signed)
New noted left carotid bruit.  Previously evaluated on MRI indicated good flow, but bruit not noted on last exam.  Plan: 1. US carotid arteries. 2. Continue cholesterol management w/ atorvastatin. 3. Follow up as needed after ultrasound.

## 2017-07-13 NOTE — Assessment & Plan Note (Addendum)
Controlled and stable on medication.  Pt has improved diet and states he has felt much better.  He has also added regular activity.  Plan: 1. Continue lisinopril-hydrochlorothaizide 10-12.5 mg once daily. 2. Encouraged increased physical activity to 30 minutes most days of the week. 3. Encouraged DASH diet. 4. Follow up 6 months

## 2017-07-21 ENCOUNTER — Ambulatory Visit
Admission: RE | Admit: 2017-07-21 | Discharge: 2017-07-21 | Disposition: A | Discharge status: Home / Self Care | Payer: Medicare Other | Source: Ambulatory Visit | Attending: Nurse Practitioner | Admitting: Nurse Practitioner

## 2017-07-21 DIAGNOSIS — R0989 Other specified symptoms and signs involving the circulatory and respiratory systems: Secondary | ICD-10-CM

## 2017-07-21 DIAGNOSIS — I6523 Occlusion and stenosis of bilateral carotid arteries: Secondary | ICD-10-CM | POA: Insufficient documentation

## 2017-11-07 ENCOUNTER — Other Ambulatory Visit: Payer: Self-pay | Admitting: Nurse Practitioner

## 2017-11-07 DIAGNOSIS — N401 Enlarged prostate with lower urinary tract symptoms: Secondary | ICD-10-CM

## 2017-11-07 DIAGNOSIS — E034 Atrophy of thyroid (acquired): Secondary | ICD-10-CM

## 2017-11-07 DIAGNOSIS — I10 Essential (primary) hypertension: Secondary | ICD-10-CM

## 2017-11-07 DIAGNOSIS — E7800 Pure hypercholesterolemia, unspecified: Secondary | ICD-10-CM

## 2017-11-07 DIAGNOSIS — N138 Other obstructive and reflux uropathy: Secondary | ICD-10-CM

## 2017-11-28 ENCOUNTER — Telehealth: Payer: Self-pay | Admitting: Nurse Practitioner

## 2017-11-28 NOTE — Telephone Encounter (Signed)
Pt. Called request medication to be called into Mail order   Terazosin, Levothroid,lisinopril  Pt  Call back  # is  [phone]

## 2017-11-29 ENCOUNTER — Other Ambulatory Visit: Payer: Self-pay

## 2017-11-29 DIAGNOSIS — N138 Other obstructive and reflux uropathy: Secondary | ICD-10-CM

## 2017-11-29 DIAGNOSIS — I10 Essential (primary) hypertension: Secondary | ICD-10-CM

## 2017-11-29 DIAGNOSIS — N401 Enlarged prostate with lower urinary tract symptoms: Principal | ICD-10-CM

## 2017-11-29 DIAGNOSIS — E034 Atrophy of thyroid (acquired): Secondary | ICD-10-CM

## 2017-11-29 MED ORDER — LEVOTHYROXINE SODIUM 75 MCG PO TABS: ORAL_TABLET | ORAL | 1 refills | Status: DC

## 2017-11-29 MED ORDER — TERAZOSIN HCL 5 MG PO CAPS: ORAL_CAPSULE | ORAL | 1 refills | Status: DC

## 2017-11-29 MED ORDER — LISINOPRIL-HYDROCHLOROTHIAZIDE 10-12.5 MG PO TABS: 1.0000 | ORAL_TABLET | Freq: Every day | ORAL | 1 refills | Status: DC

## 2018-01-09 ENCOUNTER — Ambulatory Visit (INDEPENDENT_AMBULATORY_CARE_PROVIDER_SITE_OTHER): Payer: Medicare Other | Admitting: Nurse Practitioner

## 2018-01-09 ENCOUNTER — Encounter: Payer: Self-pay | Admitting: Nurse Practitioner

## 2018-01-09 ENCOUNTER — Other Ambulatory Visit: Payer: Self-pay

## 2018-01-09 VITALS — BP 130/74 | HR 88 | Temp 98.1°F | Ht 68.0 in | Wt 211.0 lb

## 2018-01-09 DIAGNOSIS — I10 Essential (primary) hypertension: Secondary | ICD-10-CM | POA: Diagnosis not present

## 2018-01-09 DIAGNOSIS — N401 Enlarged prostate with lower urinary tract symptoms: Secondary | ICD-10-CM | POA: Diagnosis not present

## 2018-01-09 DIAGNOSIS — E782 Mixed hyperlipidemia: Secondary | ICD-10-CM | POA: Diagnosis not present

## 2018-01-09 DIAGNOSIS — E039 Hypothyroidism, unspecified: Secondary | ICD-10-CM

## 2018-01-09 DIAGNOSIS — R7303 Prediabetes: Secondary | ICD-10-CM | POA: Diagnosis not present

## 2018-01-09 DIAGNOSIS — R3915 Urgency of urination: Secondary | ICD-10-CM

## 2018-01-09 LAB — COMPLETE METABOLIC PANEL WITH GFR
AG Ratio: 1.8 (calc) (ref 1.0–2.5)
ALT: 20 U/L (ref 9–46)
AST: 21 U/L (ref 10–35)
Albumin: 4.4 g/dL (ref 3.6–5.1)
Alkaline phosphatase (APISO): 51 U/L (ref 40–115)
BUN: 10 mg/dL (ref 7–25)
CO2: 28 mmol/L (ref 20–32)
Calcium: 9.6 mg/dL (ref 8.6–10.3)
Chloride: 104 mmol/L (ref 98–110)
Creat: 0.9 mg/dL (ref 0.70–1.18)
GFR, Est African American: 96 mL/min/{1.73_m2} (ref >=60)
GFR, Est Non African American: 83 mL/min/{1.73_m2} (ref >=60)
Globulin: 2.4 g/dL (calc) (ref 1.9–3.7)
Glucose, Bld: 94 mg/dL (ref 65–99)
Potassium: 4.3 mmol/L (ref 3.5–5.3)
Sodium: 140 mmol/L (ref 135–146)
Total Bilirubin: 0.6 mg/dL (ref 0.2–1.2)
Total Protein: 6.8 g/dL (ref 6.1–8.1)

## 2018-01-09 LAB — TSH: TSH: 3.96 mIU/L (ref 0.40–4.50)

## 2018-01-09 LAB — LIPID PANEL
Cholesterol: 135 mg/dL (ref <200)
HDL: 33 mg/dL — ABNORMAL LOW (ref >40)
LDL Cholesterol (Calc): 70 mg/dL (calc)
Non-HDL Cholesterol (Calc): 102 mg/dL (calc) (ref <130)
Total CHOL/HDL Ratio: 4.1 (calc) (ref <5.0)
Triglycerides: 227 mg/dL — ABNORMAL HIGH (ref <150)

## 2018-01-09 LAB — POCT GLYCOSYLATED HEMOGLOBIN (HGB A1C): Hemoglobin A1C: 5.6 — NL

## 2018-01-09 NOTE — Progress Notes (Signed)
Subjective:    Patient ID: Joe Hardy, male    DOB: [birth year], 77 y.o.   MRN: [id number]  Joe Hardy is a 77 y.o. male presenting on 01/09/2018 for Hypertension; Insect Bite (on the left lower arm x lastnight); and Panic Attack (hyperventilating when he get anxious )   HPI Acute episodes of nausea with lightheadedness Nausea - has sensation of lightheadedness, but no dizziness.  Also experiences nervousness about this constellation of symptoms.  These anxieties also lead some to depression.  Occurs intermittently over the last few months.  Has no symptoms x 2 weeks without symptoms.  Had a spell last night with mild difficult for 1-2 days prior.  Nausea is also associated occasionally with sensation of his "legs and feet become like rubber."    Both legs feel like rubber and gets better quickly.  No numbness, but "feels like jelly".  Also had difficulty about 6 months ago.  Sometimes also has vague feelings of weakness and fatigue. - During this spell he is also noting association with history of constipation x 2 days and after use of enema.  He notes use of enema at episode also about 2 months ago. No blood in stool or other noted complications.   - Loves dairy.  Ok with yogurt and kefir.  Cheese causes more constipation.  Otherwise, normally has 1 BM per day.  Urinary Urgency Urinary urgency: Gradually worsening.  No pain, no blood, In early morning has weak stream.  No waking in night for urination. Also has to start and stop several times to completely empty I am.  Worse with caffeine.  Overall, pt notes that his urinary urgency is not affecting his quality of life.  Hypertension - He is not checking BP at home or outside of clinic.    - Current medications: lisinopril-hydrochlorothiazide 10-12.5 mg once daily, tolerating well without side effects - He is not currently symptomatic. - Pt denies headache, changes in vision, chest tightness/pressure, palpitations, leg swelling,  sudden loss of speech or loss of consciousness. - He  reports no regular exercise routine. - His diet is moderate in salt, moderate in fat, and moderate in carbohydrates. He is continuing to eat well and continues to lose weight.  Hypothyroidism - Pt states he is taking his levothyroxine 75 mcg in the evening at least 3 hours after eating his last food before bedtime.   - He is not currently symptomatic. - He denies, fatigue, excess energy, weight changes, heart racing, heart palpitations, heat and cold intolerance, changes in hair/skin/nails, and lower leg swelling.   Prediabetes Pt continues to work on improving diet and working on weight loss.  He is active, but has no exercise routine.  He is not taking any medications to control blood sugars, only focusing on improving diet and reducing sweets.  Is eating large amount of fruit with breakfast, but is having stable A1c.  Social History   Tobacco Use  . Smoking status: Former Smoker    Packs/day: 1.50    Years: 20.00    Pack years: 30.00    Types: Cigarettes  . Smokeless tobacco: Never Used  . Tobacco comment: Starts and stops related to anxiety rather than drinking alcohol  Substance Use Topics  . 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 Drug use: No    Review of Systems Per HPI unless specifically indicated above     Objective:  BP 130/74 (BP Location: Left Arm, Patient Position: Sitting, Cuff Size: Normal) Comment (Cuff Size): manual  Pulse 88   Temp 98.1 F (36.7 C) (Oral)   Ht 5\' 8"  (1.727 m)   Wt 211 lb (95.7 kg)   BMI 32.08 kg/m   Wt Readings from Last 3 Encounters:  01/09/18 211 lb (95.7 kg)  07/11/17 227 lb 12.8 oz (103.3 kg)  04/07/17 228 lb (103.4 kg)    Physical Exam  Constitutional: He is oriented to person, place, and time. He appears well-developed and well-nourished. No distress.  HENT:  Head: Normocephalic and atraumatic.  Neck: Normal range  of motion. Neck supple. Carotid bruit is not present.  Cardiovascular: Normal rate, regular rhythm, S1 normal, S2 normal, normal heart sounds and intact distal pulses.  Pulmonary/Chest: Effort normal and breath sounds normal. No respiratory distress.  Abdominal: Soft. Bowel sounds are normal. He exhibits no distension.  Musculoskeletal: He exhibits no edema (pedal).  Neurological: He is alert and oriented to person, place, and time.  Skin: Skin is warm and dry.  Psychiatric: He has a normal mood and affect. His behavior is normal.  Vitals reviewed.    Results for orders placed or performed in visit on 04/07/17  TSH  Result Value Ref Range   TSH 1.850 0.450 - 4.500 uIU/mL  Lipid panel  Result Value Ref Range   Cholesterol, Total 113 100 - 199 mg/dL   Triglycerides 196 (H) 0 - 149 mg/dL   HDL 26 (L) >39 mg/dL   VLDL Cholesterol Cal 39 5 - 40 mg/dL   LDL Calculated 48 0 - 99 mg/dL   Chol/HDL Ratio 4.3 0.0 - 5.0 ratio  Basic metabolic panel  Result Value Ref Range   Glucose 98 65 - 99 mg/dL   BUN 9 8 - 27 mg/dL   Creatinine, Ser 0.92 0.76 - 1.27 mg/dL   GFR calc non Af Amer 81 >59 mL/min/1.73   GFR calc Af Amer 94 >59 mL/min/1.73   BUN/Creatinine Ratio 10 10 - 24   Sodium 140 134 - 144 mmol/L   Potassium 4.1 3.5 - 5.2 mmol/L   Chloride 102 96 - 106 mmol/L   CO2 22 20 - 29 mmol/L   Calcium 9.4 8.6 - 10.2 mg/dL      Assessment & Plan:   Problem List Items Addressed This Visit      Cardiovascular and Mediastinum   Essential hypertension Controlled hypertension.  BP goal < 130/80.  Pt is working on lifestyle modifications.  Taking medications tolerating well without side effects. No current complications.  Normal renal function at last check 6 months ago.  Plan: 1. Continue taking hydrochlorothiazide-lisinopril 10-12.5 mg once daily.Considered increase, but BP recheck in clinic normal. 2. Obtain labs CMP  3. Encouraged heart healthy diet and increasing exercise to 30 minutes  most days of the week. 4. Check BP 1-2 x per week at home, keep log, and bring to clinic at next appointment. 5. Follow up 6 months.     Relevant Orders   COMPLETE METABOLIC PANEL WITH GFR     Endocrine   Hypothyroidism Due for reassessment.  Pt with normal TSH at recheck after last visit and regular use of levothyroxine taken correctly at this time.    Plan: 1. Repeat TSH on labs today 2. Continue levothyroxine at current dose.  Reevaluate after lab recheck. 3. Followup 6 months   Relevant Orders   TSH     Other   Mixed hyperlipidemia No recheck.  No new signs or symptoms of stroke or MI.  Pt tolerating atorvastatin well and taking without side effects.  Plan: 1. Recheck labs today. 2. Continue work on diet and lifestyle. 3. Followup 6 months.   Relevant Orders   TSH   Lipid panel   COMPLETE METABOLIC PANEL WITH GFR   Pre-diabetes - Primary Stable and currently diet controlled.  No current medications.   Plan: 1. Continue work on diet and lifestyle. 2. Recheck labs today. 3. Followup 6 months   Relevant Orders   POCT glycosylated hemoglobin (Hb A1C)   Lipid panel    Other Visit Diagnoses    Nausea Constipation Dizziness Pt presents with acute complaints of nausea and dizziness/lightheadedness likely associated with use of enema after constipation x 2 days.  Side effects have occurred on both instances of using an enema over last 2 months.  Cannot exclude other neurological cause, but fluid loss/electrolyte imbalances after enema use are most likely cause.  Plan: 1. Avoid all enema use. 2. Use dulcolax suppository if needed after 3 days of no BM. 3. Use miralax daily prn if no BM.  Take with plenty of water. 4. Followup 3 months.  Benign prostatic hyperplasia (BPH) with urinary urgency     Pt with history of BPH and now having worsening urinary urgency with reduced stream of urine.  CUrrently is not impacting quality of life significantly, but does require  additional morning voids.    Plan: 1. Recheck PSA for correlation to prior value.  Pt declined exam today. 2. Discussed medication option with tamsulosin, but pt is currently declining another medication. 3. Monitor symptoms over time.  Avoid caffeine since is trigger for worsening symptoms. 4. Followup 3 months.  Consider referral urology as needed.   Relevant Orders   PSA      Follow up plan: Return in about 3 months (around 04/10/2018) for Constipation, nausea/lightheadedness.  A total of 40 minutes was spent face-to-face with this patient. Greater than 50% of this time was spent in counseling and coordination of care with the patient for constipation, hypertension, prediabetes, and BPH management as above.  Cassell Smiles, DNP, AGPCNP-BC Adult Gerontology Primary Care Nurse Practitioner [facility] 01/09/2018, 5:30 PM

## 2018-01-09 NOTE — Patient Instructions (Addendum)
Joe Hardy,   Thank you for coming in to clinic today.  1. Avoid using enemas.   - Dulcolax suppository is better for you than an enema. - If you are constipated, use miralax first.  It is a more gentle laxative.  When you take miralax, drink a full 8-10 ounce glass of water with it.  2. Continue monitoring how often your rubbery legs sensation occurs.  If more regular, please call clinic for another appointment.  3. Continue all other medications without changes.  Please schedule a follow-up appointment with Cassell Smiles, AGNP. Return in about 3 months (around 04/10/2018) for Constipation, nausea/lightheadedness.  If you have any other questions or concerns, please feel free to call the clinic or send a message through [place]. You may also schedule an earlier appointment if necessary.  You will receive a survey after today's visit either digitally by e-mail or paper by [employer]. Your experiences and feedback matter to [place].  Please respond so we know how we are doing as we provide care for you.   Cassell Smiles, DNP, AGNP-BC Adult Gerontology Nurse Practitioner [place]

## 2018-01-10 LAB — PSA: PSA: 3.8 ng/mL (ref <=4.0)

## 2018-02-09 ENCOUNTER — Other Ambulatory Visit: Payer: Self-pay | Admitting: Nurse Practitioner

## 2018-02-09 DIAGNOSIS — E7800 Pure hypercholesterolemia, unspecified: Secondary | ICD-10-CM

## 2018-04-11 ENCOUNTER — Ambulatory Visit: Payer: Medicare Other | Admitting: Nurse Practitioner

## 2018-04-21 ENCOUNTER — Ambulatory Visit: Payer: Medicare Other | Admitting: Nurse Practitioner

## 2018-05-02 ENCOUNTER — Encounter: Payer: Self-pay | Admitting: Nurse Practitioner

## 2018-05-02 ENCOUNTER — Ambulatory Visit (INDEPENDENT_AMBULATORY_CARE_PROVIDER_SITE_OTHER): Payer: Medicare Other

## 2018-05-02 ENCOUNTER — Ambulatory Visit (INDEPENDENT_AMBULATORY_CARE_PROVIDER_SITE_OTHER): Payer: Medicare Other | Admitting: Nurse Practitioner

## 2018-05-02 ENCOUNTER — Other Ambulatory Visit: Payer: Self-pay

## 2018-05-02 VITALS — BP 133/84 | HR 98 | Temp 97.9°F | Resp 16 | Ht 68.0 in | Wt 218.0 lb

## 2018-05-02 VITALS — BP 133/84 | HR 98 | Temp 97.9°F | Ht 68.0 in | Wt 218.0 lb

## 2018-05-02 DIAGNOSIS — Z0000 Encounter for general adult medical examination without abnormal findings: Secondary | ICD-10-CM | POA: Diagnosis not present

## 2018-05-02 DIAGNOSIS — E782 Mixed hyperlipidemia: Secondary | ICD-10-CM

## 2018-05-02 DIAGNOSIS — K5901 Slow transit constipation: Secondary | ICD-10-CM | POA: Insufficient documentation

## 2018-05-02 DIAGNOSIS — M545 Low back pain, unspecified: Secondary | ICD-10-CM

## 2018-05-02 DIAGNOSIS — E039 Hypothyroidism, unspecified: Secondary | ICD-10-CM

## 2018-05-02 MED ORDER — BACLOFEN 10 MG PO TABS: 5.0000 mg | ORAL_TABLET | Freq: Two times a day (BID) | ORAL | 0 refills | Status: DC | PRN

## 2018-05-02 NOTE — Progress Notes (Addendum)
Subjective:    Patient ID: Joe Hardy, male    DOB: [birth year], 77 y.o.   MRN: [id number]  Joe Hardy is a 77 y.o. male presenting on 05/02/2018 for Back Pain (pt reports falling off a stool trying to clean his ceiling fan x 3 weeks ago) and Constipation (recurrent)   HPI Back Pain Has had problems with muscle strain in back over last 2 weeks.  This started about 1 week after his fall.  Has had this in past.  Feels pain in muscle on R side of back above hip.  Has had strain when he was moving in small movement.  Is having regular ROM normally.  Continues having intermittent worsening since resuming 2 weeks ago.  Today is better in last 2 weeks.  Fall From chair 3 weeks ago when standing in chair.  Leg of chair broke and patient fell.  He was cleaning his ceiling fan.  He had soreness after fall, but no significant injury.  He has had skin laceration.  Tetanus is up to date.  He had no loss of consciousness.  Constipation Has had much less constipation.  Is having some  2 week intervals with normal BM.  Uses prune juice for regularity.  Is getting good balance at this time.  This is improving some of his weakness, lightheadedness.  Was previously using enemas and has had none since last visit. - Balance: patient notes significant improvement since stopping his enemas.  Hyperlipidemia Patient is taking fish oil. Would like to see if this is helping improve his cholesterol.  He is fasting today.  Hypothyroidism - Pt states he is taking his levothyroxine 75 mcg in the am at least 1 hour before eating or drinking and taking other medicines.  Very few missed doses. - He is not currently symptomatic. - He denies, fatigue, excess energy, weight changes, heart racing, heart palpitations, heat and cold intolerance, changes in hair/skin/nails, and lower leg swelling.  - He does not have any compressive symptoms to include difficulty swallowing, globus sensation, or difficulty breathing when  lying flat.   Social History   Tobacco Use  . Smoking status: Former Smoker    Packs/day: 1.50    Years: 20.00    Pack years: 30.00    Types: Cigarettes  . Smokeless tobacco: Never Used  . Tobacco comment: Starts and stops related to anxiety rather than drinking alcohol  Substance Use Topics  . 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 Drug use: No    Review of Systems Per HPI unless specifically indicated above     Objective:    BP 133/84 (BP Location: Right Arm, Patient Position: Sitting, Cuff Size: Normal)   Pulse 98   Temp 97.9 F (36.6 C) (Oral)   Ht 5\' 8"  (1.727 m)   Wt 218 lb (98.9 kg)   BMI 33.15 kg/m   Wt Readings from Last 3 Encounters:  05/02/18 218 lb (98.9 kg)  01/09/18 211 lb (95.7 kg)  07/11/17 227 lb 12.8 oz (103.3 kg)    Physical Exam  Constitutional: He is oriented to person, place, and time. He appears well-developed and well-nourished. No distress.  HENT:  Head: Normocephalic and atraumatic.  Right Ear: Hearing, tympanic membrane, external ear and ear canal normal.  Left Ear: Hearing, tympanic membrane, external ear and ear canal normal.  R ear with cerumen impaction.  Normal after irrigation.  Neck: Normal range  of motion. Neck supple. No thyromegaly present.  Cardiovascular: Normal rate, regular rhythm, S1 normal, S2 normal, normal heart sounds and intact distal pulses.  Pulmonary/Chest: Effort normal and breath sounds normal. No respiratory distress.  Abdominal: Soft. Bowel sounds are normal. He exhibits no distension. There is no hepatosplenomegaly. There is no tenderness. No hernia.  Musculoskeletal:  Low Back Inspection: Normal appearance, Large body habitus, no spinal deformity, symmetrical. Palpation: No tenderness over spinous processes. Right sided lumbar paraspinal muscles mildly tender and with hypertonicity/spasm. ROM: Full active ROM forward flex / back extension,  rotation L/R without discomfort Special Testing: Seated SLR negative for radicular pain and localized R/L back pain. Strength: Bilateral hip flex/ext 5/5, knee flex/ext 5/5, ankle dorsiflex/plantarflex 5/5 Neurovascular: intact distal sensation to light touch   Neurological: He is alert and oriented to person, place, and time.  Skin: Skin is warm and dry. Capillary refill takes less than 2 seconds.  Psychiatric: He has a normal mood and affect. His behavior is normal. Judgment and thought content normal.  Vitals reviewed.    Results for orders placed or performed in visit on 01/09/18  TSH  Result Value Ref Range   TSH 3.96 0.40 - 4.50 mIU/L  Lipid panel  Result Value Ref Range   Cholesterol 135 <200 mg/dL   HDL 33 (L) >40 mg/dL   Triglycerides 227 (H) <150 mg/dL   LDL Cholesterol (Calc) 70 mg/dL (calc)   Total CHOL/HDL Ratio 4.1 <5.0 (calc)   Non-HDL Cholesterol (Calc) 102 <130 mg/dL (calc)  COMPLETE METABOLIC PANEL WITH GFR  Result Value Ref Range   Glucose, Bld 94 65 - 99 mg/dL   BUN 10 7 - 25 mg/dL   Creat 0.90 0.70 - 1.18 mg/dL   GFR, Est Non African American 83 > OR = 60 mL/min/1.61m2   GFR, Est African American 96 > OR = 60 mL/min/1.3m2   BUN/Creatinine Ratio NOT APPLICABLE 6 - 22 (calc)   Sodium 140 135 - 146 mmol/L   Potassium 4.3 3.5 - 5.3 mmol/L   Chloride 104 98 - 110 mmol/L   CO2 28 20 - 32 mmol/L   Calcium 9.6 8.6 - 10.3 mg/dL   Total Protein 6.8 6.1 - 8.1 g/dL   Albumin 4.4 3.6 - 5.1 g/dL   Globulin 2.4 1.9 - 3.7 g/dL (calc)   AG Ratio 1.8 1.0 - 2.5 (calc)   Total Bilirubin 0.6 0.2 - 1.2 mg/dL   Alkaline phosphatase (APISO) 51 40 - 115 U/L   AST 21 10 - 35 U/L   ALT 20 9 - 46 U/L  PSA  Result Value Ref Range   PSA 3.8 < OR = 4.0 ng/mL  POCT glycosylated hemoglobin (Hb A1C)  Result Value Ref Range   Hemoglobin A1C 5.6       Assessment & Plan:   Problem List Items Addressed This Visit      Digestive   Constipation due to slow transit    Improved  today on exam.  Medications avoided. Suspect regular enema use was cause of dizziness/lightheadedness as these are now resolved.  Uses prune juice every other day as needed.  Continue without change.  Add Miralax if no BM in 3 days after using prune juice if no BM in 2 days.  Followup 3 months and prn.         Endocrine   Hypothyroidism    Previously stable.  Pt continues with correct administration regimen.  Plan: 1. Encouraged continued adherence to proper administration.  Continue levothyroxine 75 mcg once daily. 2. Repeat TSH today. 3. Follow up 3 months.      Relevant Orders   Lipid panel   TSH     Other   Mixed hyperlipidemia    Last labs indicated LDL was controlled, but triglycerides had increased. Pt tolerating atorvastatin well.  Has started fish oil daily.  Plan: 1. Continue atorvastatin 20 mg once daily. - Continue fish oil 2,000 mg once daily 2. Encouraged DASH diet/ heart healthy diet. 3. Encouraged physical activity. 4. Follow up w/ labs today.   5. Follow up 3-6 months.      Relevant Orders   Lipid panel   TSH    Other Visit Diagnoses    Acute right-sided low back pain without sciatica    -  Primary   Relevant Medications   baclofen (LIORESAL) 10 MG tablet    Pain likely self-limited.  Muscle strain possible complicated by chronic back pain and tendency toward restraining.  Plan:  1. Treat with OTC pain meds (acetaminophen and ibuprofen).  Discussed alternate dosing and max dosing. 2. Apply heat and/or ice to affected area. 3. May also apply a muscle rub with lidocaine or lidocaine patch after heat or ice. 4. Provided back pain exercises.  Instructed patient to complete these lying in bed if needed.  5. Take muscle relaxer baclofen 5 mg up to two times daily.  Cautioned drowsiness.  Avoid with night time meds as they are already very sedating.  Prepare to take in afternoon and take a nap with this medication.  Patient verbalizes understanding. 6. Follow  up prn.     Meds ordered this encounter  Medications  . baclofen (LIORESAL) 10 MG tablet    Sig: Take 0.5 tablets (5 mg total) by mouth 2 (two) times daily as needed for muscle spasms (low back pain).    Dispense:  20 each    Refill:  0    Order Specific Question:   Supervising Provider    Answer:   Olin Hauser [[id number]]    Follow up plan: Return in about 3 months (around 08/02/2018) for pre-diabetes.  Cassell Smiles, DNP, AGPCNP-BC Adult Gerontology Primary Care Nurse Practitioner [facility] 05/02/2018, 11:24 AM

## 2018-05-02 NOTE — Patient Instructions (Addendum)
[name] , Thank you for taking time to come for your Medicare Wellness Visit. I appreciate your ongoing commitment to your health goals. Please review the following plan we discussed and let me know if I can assist you in the future.   Screening recommendations/referrals: Colonoscopy: completed 09/2014 Recommended yearly ophthalmology/optometry visit for glaucoma screening and checkup Recommended yearly dental visit for hygiene and checkup  Vaccinations: Influenza vaccine: due 05/2018  Pneumococcal vaccine: completed series Tdap vaccine: up to date Shingles vaccine: shingrix eligilble, check with your insurance company for coverage    Advanced directives: Please bring a copy of your health care power of attorney and living will to the office at your convenience.  Conditions/risks identified: recommend 6-8 glasses of water a day   Next appointment: Follow up in one year for your annual wellness exam.   Preventive Care 65 Years and Older, Male Preventive care refers to lifestyle choices and visits with your health care provider that can promote health and wellness. What does preventive care include?  A yearly physical exam. This is also called an annual well check.  Dental exams once or twice a year.  Routine eye exams. Ask your health care provider how often you should have your eyes checked.  Personal lifestyle choices, including:  Daily care of your teeth and gums.  Regular physical activity.  Eating a healthy diet.  Avoiding tobacco and drug use.  Limiting alcohol use.  Practicing safe sex.  Taking low doses of aspirin every day.  Taking vitamin and mineral supplements as recommended by your health care provider. What happens during an annual well check? The services and screenings done by your health care provider during your annual well check will depend on your age, overall health, lifestyle risk factors, and family history of disease. Counseling  Your health  care provider may ask you questions about your:  Alcohol use.  Tobacco use.  Drug use.  Emotional well-being.  Home and relationship well-being.  Sexual activity.  Eating habits.  History of falls.  Memory and ability to understand (cognition).  Work and work Statistician. Screening  You may have the following tests or measurements:  Height, weight, and BMI.  Blood pressure.  Lipid and cholesterol levels. These may be checked every 5 years, or more frequently if you are over 16 years old.  Skin check.  Lung cancer screening. You may have this screening every year starting at age 80 if you have a 30-pack-year history of smoking and currently smoke or have quit within the past 15 years.  Fecal occult blood test (FOBT) of the stool. You may have this test every year starting at age 56.  Flexible sigmoidoscopy or colonoscopy. You may have a sigmoidoscopy every 5 years or a colonoscopy every 10 years starting at age 54.  Prostate cancer screening. Recommendations will vary depending on your family history and other risks.  Hepatitis C blood test.  Hepatitis B blood test.  Sexually transmitted disease (STD) testing.  Diabetes screening. This is done by checking your blood sugar (glucose) after you have not eaten for a while (fasting). You may have this done every 1-3 years.  Abdominal aortic aneurysm (AAA) screening. You may need this if you are a current or former smoker.  Osteoporosis. You may be screened starting at age 107 if you are at high risk. Talk with your health care provider about your test results, treatment options, and if necessary, the need for more tests. Vaccines  Your health care provider  may recommend certain vaccines, such as:  Influenza vaccine. This is recommended every year.  Tetanus, diphtheria, and acellular pertussis (Tdap, Td) vaccine. You may need a Td booster every 10 years.  Zoster vaccine. You may need this after age  83.  Pneumococcal 13-valent conjugate (PCV13) vaccine. One dose is recommended after age 14.  Pneumococcal polysaccharide (PPSV23) vaccine. One dose is recommended after age 78. Talk to your health care provider about which screenings and vaccines you need and how often you need them. This information is not intended to replace advice given to you by your health care provider. Make sure you discuss any questions you have with your health care provider. Document Released: 09/26/2015 Document Revised: 05/19/2016 Document Reviewed: 07/01/2015 Elsevier Interactive Patient Education  2017 [employer] Prevention in the Home Falls can cause injuries. They can happen to people of all ages. There are many things you can do to make your home safe and to help prevent falls. What can I do on the outside of my home?  Regularly fix the edges of walkways and driveways and fix any cracks.  Remove anything that might make you trip as you walk through a door, such as a raised step or threshold.  Trim any bushes or trees on the path to your home.  Use bright outdoor lighting.  Clear any walking paths of anything that might make someone trip, such as rocks or tools.  Regularly check to see if handrails are loose or broken. Make sure that both sides of any steps have handrails.  Any raised decks and porches should have guardrails on the edges.  Have any leaves, snow, or ice cleared regularly.  Use sand or salt on walking paths during winter.  Clean up any spills in your garage right away. This includes oil or grease spills. What can I do in the bathroom?  Use night lights.  Install grab bars by the toilet and in the tub and shower. Do not use towel bars as grab bars.  Use non-skid mats or decals in the tub or shower.  If you need to sit down in the shower, use a plastic, non-slip stool.  Keep the floor dry. Clean up any water that spills on the floor as soon as it happens.  Remove  soap buildup in the tub or shower regularly.  Attach bath mats securely with double-sided non-slip rug tape.  Do not have throw rugs and other things on the floor that can make you trip. What can I do in the bedroom?  Use night lights.  Make sure that you have a light by your bed that is easy to reach.  Do not use any sheets or blankets that are too big for your bed. They should not hang down onto the floor.  Have a firm chair that has side arms. You can use this for support while you get dressed.  Do not have throw rugs and other things on the floor that can make you trip. What can I do in the kitchen?  Clean up any spills right away.  Avoid walking on wet floors.  Keep items that you use a lot in easy-to-reach places.  If you need to reach something above you, use a strong step stool that has a grab bar.  Keep electrical cords out of the way.  Do not use floor polish or wax that makes floors slippery. If you must use wax, use non-skid floor wax.  Do not have throw rugs  and other things on the floor that can make you trip. What can I do with my stairs?  Do not leave any items on the stairs.  Make sure that there are handrails on both sides of the stairs and use them. Fix handrails that are broken or loose. Make sure that handrails are as long as the stairways.  Check any carpeting to make sure that it is firmly attached to the stairs. Fix any carpet that is loose or worn.  Avoid having throw rugs at the top or bottom of the stairs. If you do have throw rugs, attach them to the floor with carpet tape.  Make sure that you have a light switch at the top of the stairs and the bottom of the stairs. If you do not have them, ask someone to add them for you. What else can I do to help prevent falls?  Wear shoes that:  Do not have high heels.  Have rubber bottoms.  Are comfortable and fit you well.  Are closed at the toe. Do not wear sandals.  If you use a  stepladder:  Make sure that it is fully opened. Do not climb a closed stepladder.  Make sure that both sides of the stepladder are locked into place.  Ask someone to hold it for you, if possible.  Clearly mark and make sure that you can see:  Any grab bars or handrails.  First and last steps.  Where the edge of each step is.  Use tools that help you move around (mobility aids) if they are needed. These include:  Canes.  Walkers.  Scooters.  Crutches.  Turn on the lights when you go into a dark area. Replace any light bulbs as soon as they burn out.  Set up your furniture so you have a clear path. Avoid moving your furniture around.  If any of your floors are uneven, fix them.  If there are any pets around you, be aware of where they are.  Review your medicines with your doctor. Some medicines can make you feel dizzy. This can increase your chance of falling. Ask your doctor what other things that you can do to help prevent falls. This information is not intended to replace advice given to you by your health care provider. Make sure you discuss any questions you have with your health care provider. Document Released: 06/26/2009 Document Revised: 02/05/2016 Document Reviewed: 10/04/2014 Elsevier Interactive Patient Education  2017 [employer].

## 2018-05-02 NOTE — Assessment & Plan Note (Signed)
Improved today on exam.  Medications avoided. Suspect regular enema use was cause of dizziness/lightheadedness as these are now resolved.  Uses prune juice every other day as needed.  Continue without change.  Add Miralax if no BM in 3 days after using prune juice if no BM in 2 days.  Followup 3 months and prn.

## 2018-05-02 NOTE — Progress Notes (Signed)
Subjective:   Joe Hardy is a 77 y.o. male who presents for Medicare Annual/Subsequent preventive examination.  Review of Systems:   Cardiac Risk Factors include: diabetes mellitus;hypertension;male gender;obesity (BMI >30kg/m2);smoking/ tobacco exposure     Objective:    Vitals: BP 133/84 (BP Location: Left Arm, Patient Position: Sitting)   Pulse 98   Temp 97.9 F (36.6 C) (Oral)   Resp 16   Ht 5\' 8"  (1.727 m)   Wt 218 lb (98.9 kg)   BMI 33.15 kg/m   Body mass index is 33.15 kg/m.  Advanced Directives 05/02/2018 04/05/2017 05/13/2016 04/14/2015 04/14/2015  Does Patient Have a Medical Advance Directive? Yes No No No Yes  Type of Advance Directive Living will;Healthcare Power of Attorney - - - -  Does patient want to make changes to medical advance directive? - Yes (MAU/Ambulatory/Procedural Areas - Information given) - - -  Copy of [place] in Chart? No - copy requested - - - -  Would patient like information on creating a medical advance directive? - - Yes - Scientist, clinical (histocompatibility and immunogenetics) given Yes - Scientist, clinical (histocompatibility and immunogenetics) given -    Tobacco Social History   Tobacco Use  Smoking Status Former Smoker  . Packs/day: 1.50  . Years: 20.00  . Pack years: 30.00  . Types: Cigarettes  . Last attempt to quit: 05/02/2017  . Years since quitting: 1.0  Smokeless Tobacco Never Used  Tobacco Comment   Starts and stops related to anxiety rather than drinking alcohol     Counseling given: Not Answered Comment: Starts and stops related to anxiety rather than drinking alcohol   Clinical Intake:  Pre-visit preparation completed: Yes        Nutritional Status: BMI > 30  Obese Nutritional Risks: None Diabetes: Yes CBG done?: No Did pt. bring in CBG monitor from home?: No  How often do you need to have someone help you when you read instructions, pamphlets, or other written materials from your doctor or pharmacy?: 1 - Never What is the last grade level you completed  in school?: [employer]- bachelors degree  Interpreter Needed?: No  Information entered by :: Yazmine Sorey,LPN   Past Medical History:  Diagnosis Date  . Depression   . GERD (gastroesophageal reflux disease)   . Hyperlipidemia   . Hypertension   . Migraine   . Sleep apnea    Past Surgical History:  Procedure Laterality Date  . oral surgery    . TONSILLECTOMY     Family History  Problem Relation Age of Onset  . Tuberculosis Mother   . Asthma Mother   . Emphysema Father   . Heart attack Father    Social History   Socioeconomic History  . Marital status: Divorced    Spouse name: Not on file  . Number of children: Not on file  . Years of education: Not on file  . Highest education level: Bachelor's degree (e.g., BA, AB, BS)  Occupational History  . Not on file  Social Needs  . Financial resource strain: Not very hard  . Food insecurity:    Worry: Never true    Inability: Never true  . Transportation needs:    Medical: No    Non-medical: No  Tobacco Use  . Smoking status: Former Smoker    Packs/day: 1.50    Years: 20.00    Pack years: 30.00    Types: Cigarettes    Last attempt to quit: 05/02/2017    Years since quitting: 1.0  .  Smokeless tobacco: Never Used  . Tobacco comment: Starts and stops related to anxiety rather than drinking alcohol  Substance and Sexual Activity  . 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 Drug use: No  . Sexual activity: Not Currently  Lifestyle  . Physical activity:    Days per week: 0 days    Minutes per session: 0 min  . Stress: Not at all  Relationships  . Social connections:    Talks on phone: More than three times a week    Gets together: More than three times a week    Attends religious service: Never    Active member of club or organization: No    Attends meetings of clubs or organizations: Never    Relationship status: Divorced  Other Topics Concern  . Not  on file  Social History Narrative  . Not on file    Outpatient Encounter Medications as of 05/02/2018  Medication Sig  . Ascorbic Acid (VITAMIN C) 1000 MG tablet Take 1,000 mg by mouth daily.  Marland Kitchen atorvastatin (LIPITOR) 20 MG tablet TAKE 1 TABLET BY MOUTH  DAILY AT 6 PM.  . b complex vitamins tablet Take 1 tablet by mouth daily.  . Blood Pressure Monitoring (SM WRIST CUFF BP MONITOR) MISC 1 Units by Does not apply route 2 (two) times a week.  Marland Kitchen buPROPion (WELLBUTRIN XL) 150 MG 24 hr tablet Take 150 mg by mouth 3 (three) times daily.   . cyanocobalamin 1000 MCG tablet Take 1,000 mcg by mouth daily.  . ferrous sulfate 325 (65 FE) MG tablet Take 325 mg by mouth daily with breakfast.  . Flaxseed, Linseed, (FLAX SEEDS PO) Take by mouth.  . Iron Combinations (CHROMAGEN PO) Take by mouth.  . levothyroxine (SYNTHROID, LEVOTHROID) 75 MCG tablet TAKE 1 TABLET BY MOUTH  DAILY BEFORE BREAKFAST  . lisinopril-hydrochlorothiazide (PRINZIDE,ZESTORETIC) 10-12.5 MG tablet Take 1 tablet by mouth daily.  . magnesium 30 MG tablet Take 30 mg by mouth 2 (two) times daily.  . mirtazapine (REMERON) 30 MG tablet Take 30 mg by mouth at bedtime.   . Multiple Vitamin (MULTIVITAMIN WITH MINERALS) TABS tablet Take 1 tablet by mouth daily.  . Omega-3 Fatty Acids (FISH OIL) 1200 MG CAPS Take by mouth.  . terazosin (HYTRIN) 5 MG capsule TAKE 1 CAPSULE BY MOUTH  ONCE DAILY  . traZODone (DESYREL) 150 MG tablet Take 150 mg by mouth at bedtime.   Marland Kitchen terazosin (HYTRIN) 5 MG capsule TAKE 1 CAPSULE BY MOUTH ONCE DAILY (Patient not taking: Reported on 01/09/2018)   No facility-administered encounter medications on file as of 05/02/2018.     Activities of Daily Living In your present state of health, do you have any difficulty performing the following activities: 05/02/2018  Hearing? Y  Vision? N  Comment wears reading glasses  Difficulty concentrating or making decisions? N  Walking or climbing stairs? N  Dressing or bathing? N    Doing errands, shopping? N  Preparing Food and eating ? N  Using the Toilet? N  In the past six months, have you accidently leaked urine? N  Do you have problems with loss of bowel control? N  Managing your Medications? N  Managing your Finances? N  Housekeeping or managing your Housekeeping? N  Some recent data might be hidden    Patient Care Team: Mikey College, NP as PCP - General (Nurse Practitioner) Myer Haff, MD as Referring  Physician (Psychiatry) Corrie Dandy, LCSW as Social Worker (Psychiatry)   Assessment:   This is a routine wellness examination for Joe Hardy.  Exercise Activities and Dietary recommendations Current Exercise Habits: The patient does not participate in regular exercise at present, Exercise limited by: None identified  Goals    . Exercise 3x per week (30 min per time)     Patient would like to lose 30 pounds over the next year. He will increase exercise (walking).    . Increase water intake     Recommend drinking at least 6-8 glasses of water a day     . Reduce calorie intake to 2000 calories per day     Reduce calories from sugar sweetened beverages and concentrated sweets.     . Reduced depressive symptoms. (pt-stated)     Pt would like to work with his psychiatrist to reduce depressive symptoms.     . Weight < 200 lb (90.719 kg)     Wants to loss weight and having less depression       Fall Risk Fall Risk  05/02/2018 05/02/2018 05/02/2018 04/05/2017 12/29/2016  Falls in the past year? Yes Yes No No No  Number falls in past yr: 1 1 - - -  Injury with Fall? Yes Yes - - -  Follow up Falls evaluation completed - - - -   Is the patient's home free of loose throw rugs in walkways, pet beds, electrical cords, etc?   no      Grab bars in the bathroom? no      Handrails on the stairs?   no      Adequate lighting?   yes  Timed Get Up and Go Performed: Completed in 8 seconds with no use of assistive devices, steady gait. No intervention  needed at this time.   Depression Screen PHQ 2/9 Scores 05/02/2018 05/02/2018 01/09/2018 04/05/2017  PHQ - 2 Score 0 3 1 0  PHQ- 9 Score - 5 5 -    Cognitive Function MMSE - Mini Mental State Exam 05/13/2016 04/14/2015  Orientation to time 5 5  Orientation to Place 5 5  Registration 3 3  Attention/ Calculation 5 5  Recall 3 3  Language- name 2 objects 2 2  Language- repeat 1 1  Language- follow 3 step command 3 3  Language- read & follow direction 1 1  Write a sentence 1 1  Copy design 1 1  Total score 30 30     6CIT Screen 05/02/2018 04/05/2017  What Year? 0 points 0 points  What month? 0 points 0 points  What time? 0 points 0 points  Count back from 20 0 points 0 points  Months in reverse 0 points 0 points  Repeat phrase 2 points 2 points  Total Score 2 2    Immunization History  Administered Date(s) Administered  . Influenza, High Dose Seasonal PF 07/18/2015, 06/23/2016, 07/11/2017  . Influenza-Unspecified 06/13/2014  . Pneumococcal Conjugate-13 03/13/2014  . Pneumococcal Polysaccharide-23 09/13/2012  . Zoster 09/13/2012    Qualifies for Shingles Vaccine?  Yes, discussed shingrix vaccine   Screening Tests Health Maintenance  Topic Date Due  . INFLUENZA VACCINE  04/13/2018  . TETANUS/TDAP  04/16/2025  . PNA vac Low Risk Adult  Completed   Cancer Screenings: Lung: Low Dose CT Chest recommended if Age 49-80 years, 30 pack-year currently smoking OR have quit w/in 15years. Patient does qualify. - declined Colorectal: completed 09/2014   Additional Screenings:  Hepatitis C Screening: not indicated  Plan:    I have personally reviewed and addressed the Medicare Annual Wellness questionnaire and have noted the following in the patient's chart:  A. Medical and social history B. Use of alcohol, tobacco or illicit drugs  C. Current medications and supplements D. Functional ability and status E.  Nutritional status F.  Physical activity G. Advance  directives H. List of other physicians I.  Hospitalizations, surgeries, and ER visits in previous 12 months J.  [place] such as hearing and vision if needed, cognitive and depression L. Referrals and appointments   In addition, I have reviewed and discussed with patient certain preventive protocols, quality metrics, and best practice recommendations. A written personalized care plan for preventive services as well as general preventive health recommendations were provided to patient.   Signed,  Tyler Aas, LPN Nurse Health Advisor   Nurse Notes:none

## 2018-05-02 NOTE — Assessment & Plan Note (Signed)
Previously stable.  Pt continues with correct administration regimen.  Plan: 1. Encouraged continued adherence to proper administration.  Continue levothyroxine 75 mcg once daily. 2. Repeat TSH today. 3. Follow up 3 months.

## 2018-05-02 NOTE — Assessment & Plan Note (Addendum)
Last labs indicated LDL was controlled, but triglycerides had increased. Pt tolerating atorvastatin well.  Has started fish oil daily.  Plan: 1. Continue atorvastatin 20 mg once daily. - Continue fish oil 2,000 mg once daily 2. Encouraged DASH diet/ heart healthy diet. 3. Encouraged physical activity. 4. Follow up w/ labs today.   5. Follow up 3-6 months.

## 2018-05-02 NOTE — Patient Instructions (Addendum)
Joe Hardy,   Thank you for coming in to clinic today.  1. You have a RIGHT lumbar muscle strain. Likely caused by chronic back pain. - Start taking Tylenol extra strength 1 to 2 tablets every 6-8 hours for aches or fever/chills for next few days as needed.  Do not take more than 3,000 mg in 24 hours from all medicines.  May take Ibuprofen as well if tolerated 200-400mg  every 8 hours as needed. May alternate tylenol and ibuprofen in same day. - Use heat and ice.  Apply this for 15 minutes at a time 6-8 times per day.   - Muscle rub with lidocaine, lidocaine patch, Biofreeze, or tiger balm for topical pain relief.  Avoid using this with heat and ice to avoid burns. - START muscle relaxer baclofen 5 mg one half tablet up to two times daily.  This can cause drowsiness, so use caution and be ready to take a nap.  Do not take this with your night time medications.  2. Continue prune juice for your constipation. - May also use miralax if no BM in 3 days.  Continue drinking plenty of water.  3. Avoid standing in and on furniture for cleaning.  Request help from maintenance staff as needed.  Please schedule a follow-up appointment with Cassell Smiles, AGNP. Return in about 3 months (around 08/02/2018) for pre-diabetes.  If you have any other questions or concerns, please feel free to call the clinic or send a message through [place]. You may also schedule an earlier appointment if necessary.  You will receive a survey after today's visit either digitally by e-mail or paper by [employer]. Your experiences and feedback matter to [place].  Please respond so we know how we are doing as we provide care for you.   Cassell Smiles, DNP, AGNP-BC Adult Gerontology Nurse Practitioner [facility], [facility]  Low Back Pain Exercises See other page with pictures of each exercise.  Start with 1 or 2 of these exercises that you are most comfortable with. Do not do any exercises that cause you  significant worsening pain. Some of these may cause some "stretching soreness" but it should go away after you stop the exercise, and get better over time. Gradually increase up to 3-4 exercises as tolerated.  Standing hamstring stretch: Place the heel of your leg on a stool about 15 inches high. Keep your knee straight. Lean forward, bending at the hips until you feel a mild stretch in the back of your thigh. Make sure you do not roll your shoulders and bend at the waist when doing this or you will stretch your lower back instead. Hold the stretch for 15 to 30 seconds. Repeat 3 times. Repeat the same stretch on your other leg.  Cat and camel: Get down on your hands and knees. Let your stomach sag, allowing your back to curve downward. Hold this position for 5 seconds. Then arch your back and hold for 5 seconds. Do 3 sets of 10.  Quadriped Arm/Leg Raises: Get down on your hands and knees. Tighten your abdominal muscles to stiffen your spine. While keeping your abdominals tight, raise one arm and the opposite leg away from you. Hold this position for 5 seconds. Lower your arm and leg slowly and alternate sides. Do this 10 times on each side.  Pelvic tilt: Lie on your back with your knees bent and your feet flat on the floor. Tighten your abdominal muscles and push your lower back into the  floor. Hold this position for 5 seconds, then relax. Do 3 sets of 10.  Partial curl: Lie on your back with your knees bent and your feet flat on the floor. Tighten your stomach muscles and flatten your back against the floor. Tuck your chin to your chest. With your hands stretched out in front of you, curl your upper body forward until your shoulders clear the floor. Hold this position for 3 seconds. Don't hold your breath. It helps to breathe out as you lift your shoulders up. Relax. Repeat 10 times. Build to 3 sets of 10. To challenge yourself, clasp your hands behind your head and keep your elbows out to the  side.  Lower trunk rotation: Lie on your back with your knees bent and your feet flat on the floor. Tighten your abdominal muscles and push your lower back into the floor. Keeping your shoulders down flat, gently rotate your legs to one side, then the other as far as you can. Repeat 10 to 20 times.  Single knee to chest stretch: Lie on your back with your legs straight out in front of you. Bring one knee up to your chest and grasp the back of your thigh. Pull your knee toward your chest, stretching your buttock muscle. Hold this position for 15 to 30 seconds and return to the starting position. Repeat 3 times on each side.  Double knee to chest: Lie on your back with your knees bent and your feet flat on the floor. Tighten your abdominal muscles and push your lower back into the floor. Pull both knees up to your chest. Hold for 5 seconds and repeat 10 to 20 times.

## 2018-05-03 LAB — LIPID PANEL
Cholesterol: 140 mg/dL (ref <200)
HDL: 34 mg/dL — ABNORMAL LOW (ref >40)
LDL Cholesterol (Calc): 78 mg/dL (calc)
Non-HDL Cholesterol (Calc): 106 mg/dL (calc) (ref <130)
Total CHOL/HDL Ratio: 4.1 (calc) (ref <5.0)
Triglycerides: 185 mg/dL — ABNORMAL HIGH (ref <150)

## 2018-05-03 LAB — TSH: TSH: 1.99 mIU/L (ref 0.40–4.50)

## 2018-05-15 ENCOUNTER — Other Ambulatory Visit: Payer: Self-pay | Admitting: Nurse Practitioner

## 2018-05-15 DIAGNOSIS — I10 Essential (primary) hypertension: Secondary | ICD-10-CM

## 2018-05-15 DIAGNOSIS — N401 Enlarged prostate with lower urinary tract symptoms: Secondary | ICD-10-CM

## 2018-05-15 DIAGNOSIS — N138 Other obstructive and reflux uropathy: Secondary | ICD-10-CM

## 2018-05-15 DIAGNOSIS — E034 Atrophy of thyroid (acquired): Secondary | ICD-10-CM

## 2018-07-03 ENCOUNTER — Telehealth: Payer: Self-pay | Admitting: Family Medicine

## 2018-07-03 NOTE — Telephone Encounter (Signed)
Pt needs refills on levothyroxine, lisinopril and terazosin sent to [facility]

## 2018-07-04 NOTE — Telephone Encounter (Signed)
Was already has refill in 09/19 with 90 days --1 refill. Patient called here first before calling pharmacy.

## 2018-08-03 ENCOUNTER — Ambulatory Visit: Payer: Medicare Other | Admitting: Nurse Practitioner

## 2018-08-08 ENCOUNTER — Encounter: Payer: Self-pay | Admitting: Nurse Practitioner

## 2018-08-08 ENCOUNTER — Ambulatory Visit (INDEPENDENT_AMBULATORY_CARE_PROVIDER_SITE_OTHER): Payer: Medicare Other | Admitting: Nurse Practitioner

## 2018-08-08 ENCOUNTER — Other Ambulatory Visit: Payer: Self-pay

## 2018-08-08 VITALS — BP 125/66 | HR 80 | Temp 98.4°F | Ht 67.5 in | Wt 219.0 lb

## 2018-08-08 DIAGNOSIS — Z23 Encounter for immunization: Secondary | ICD-10-CM | POA: Diagnosis not present

## 2018-08-08 DIAGNOSIS — M545 Low back pain, unspecified: Secondary | ICD-10-CM

## 2018-08-08 DIAGNOSIS — N401 Enlarged prostate with lower urinary tract symptoms: Secondary | ICD-10-CM

## 2018-08-08 DIAGNOSIS — E039 Hypothyroidism, unspecified: Secondary | ICD-10-CM

## 2018-08-08 DIAGNOSIS — E7800 Pure hypercholesterolemia, unspecified: Secondary | ICD-10-CM | POA: Insufficient documentation

## 2018-08-08 DIAGNOSIS — R7303 Prediabetes: Secondary | ICD-10-CM

## 2018-08-08 DIAGNOSIS — N138 Other obstructive and reflux uropathy: Secondary | ICD-10-CM

## 2018-08-08 DIAGNOSIS — I10 Essential (primary) hypertension: Secondary | ICD-10-CM | POA: Diagnosis not present

## 2018-08-08 LAB — LIPID PANEL
Cholesterol: 133 mg/dL (ref <200)
HDL: 31 mg/dL — ABNORMAL LOW (ref >40)
LDL Cholesterol (Calc): 75 mg/dL (calc)
Non-HDL Cholesterol (Calc): 102 mg/dL (calc) (ref <130)
Total CHOL/HDL Ratio: 4.3 (calc) (ref <5.0)
Triglycerides: 173 mg/dL — ABNORMAL HIGH (ref <150)

## 2018-08-08 LAB — COMPLETE METABOLIC PANEL WITH GFR
AG Ratio: 2 (calc) (ref 1.0–2.5)
ALT: 21 U/L (ref 9–46)
AST: 21 U/L (ref 10–35)
Albumin: 4.4 g/dL (ref 3.6–5.1)
Alkaline phosphatase (APISO): 48 U/L (ref 40–115)
BUN: 17 mg/dL (ref 7–25)
CO2: 26 mmol/L (ref 20–32)
Calcium: 9.5 mg/dL (ref 8.6–10.3)
Chloride: 104 mmol/L (ref 98–110)
Creat: 0.91 mg/dL (ref 0.70–1.18)
GFR, Est African American: 94 mL/min/{1.73_m2} (ref >=60)
GFR, Est Non African American: 81 mL/min/{1.73_m2} (ref >=60)
Globulin: 2.2 g/dL (calc) (ref 1.9–3.7)
Glucose, Bld: 90 mg/dL (ref 65–99)
Potassium: 4 mmol/L (ref 3.5–5.3)
Sodium: 139 mmol/L (ref 135–146)
Total Bilirubin: 0.6 mg/dL (ref 0.2–1.2)
Total Protein: 6.6 g/dL (ref 6.1–8.1)

## 2018-08-08 LAB — POCT GLYCOSYLATED HEMOGLOBIN (HGB A1C): Hemoglobin A1C: 5.6 % (ref 4.0–5.6)

## 2018-08-08 LAB — TSH: TSH: 2.33 mIU/L (ref 0.40–4.50)

## 2018-08-08 MED ORDER — BACLOFEN 10 MG PO TABS: 5.0000 mg | ORAL_TABLET | Freq: Two times a day (BID) | ORAL | 1 refills | Status: DC | PRN

## 2018-08-08 MED ORDER — LISINOPRIL-HYDROCHLOROTHIAZIDE 10-12.5 MG PO TABS: 1.0000 | ORAL_TABLET | Freq: Every day | ORAL | 2 refills | Status: DC

## 2018-08-08 MED ORDER — TERAZOSIN HCL 5 MG PO CAPS: 5.0000 mg | ORAL_CAPSULE | Freq: Every day | ORAL | 2 refills | Status: DC

## 2018-08-08 MED ORDER — ATORVASTATIN CALCIUM 20 MG PO TABS: 20.0000 mg | ORAL_TABLET | Freq: Every day | ORAL | 2 refills | Status: DC

## 2018-08-08 NOTE — Assessment & Plan Note (Signed)
Well- controlled prediabetes  with A1c 5.6 stable from 6 months ago. - No known complications or hypoglycemia.  Plan:  1. Continue current therapy: diet and exercise 2. Encourage improved lifestyle: - low carb/low glycemic diet reinforced prior education - Increase physical activity to 30 minutes most days of the week.  Explained that increased physical activity increases body's use of sugar for energy. 3. Follow-up 6 months

## 2018-08-08 NOTE — Progress Notes (Signed)
Subjective:    Patient ID: Joe Hardy, male    DOB: Jan 26, 1941, 77 y.o.   MRN: [id number]  Joe Hardy is a 77 y.o. male presenting on 08/08/2018 for Hypertension (pre-diabetes)   HPI  Patient today states he is feeling the best that he has in last 6-12 months than he has in a long time.  Hypertension - He is not checking BP at home or outside of clinic.    - Current medications: lisinopril-HCTZ once daily, tolerating well without side effects  - Also on terazosin for BPH - He is not currently symptomatic. - Pt denies headache, lightheadedness, dizziness, changes in vision, chest tightness/pressure, palpitations, leg swelling, sudden loss of speech or loss of consciousness.  Prediabetes Has continued changing diet and is avoiding most sugars - uses honey in hot drinks only.  Ginger, Green tea, celestial seasonings fruit zinger teas.  - He is not currently symptomatic.  - He denies polydipsia, polyphagia, polyuria, headaches, diaphoresis, shakiness, chills, pain, numbness or tingling in extremities and changes in vision.   - Clinical course has been stable. - He  reports no regular exercise routine. - His diet is moderate in salt, moderate in fat, and moderate in carbohydrates. - Weight trend: stable  Recent Labs    01/09/18 1731 08/08/18 0936  HGBA1C 5.6 5.6    Hypertriglyceridemia Since last visit patient has started taking thousand milligrams fish oil and thousand milligrams flaxseed oil daily.  He has continued eating healthy diet with fewer sugars to help manage his prediabetes.  He reports no new signs or symptoms of ASCVD and has had no ASCVD events. Pt denies changes in vision, chest tightness/pressure, palpitations, shortness of breath, leg pain while walking, leg or arm weakness, and sudden loss of speech or loss of consciousness.   Gastroenteritis Transient nausea and diarrhea last week.  Lasted about 1 day and is now resolved.  Generally has normal BM every  1-2 days.  Back spasm Has had very little need for his baclofen - only used 14 tabs over last 3 months.  Had about 1 month when he did not need to use any of his baclofen.  Continues to have intermittent spasm.  Notes good response to baclofen without feeling like he is under the influence of heavy drugs.  Social History   Tobacco Use  . Smoking status: Former Smoker    Packs/day: 1.50    Years: 20.00    Pack years: 30.00    Types: Cigarettes    Last attempt to quit: 05/02/2017    Years since quitting: 1.2  . Smokeless tobacco: Never Used  . Tobacco comment: Starts and stops related to anxiety rather than drinking alcohol  Substance Use Topics  . 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 Drug use: No    Review of Systems Per HPI unless specifically indicated above     Objective:    BP 125/66 (BP Location: Right Arm, Patient Position: Sitting, Cuff Size: Large)   Pulse 80   Temp 98.4 F (36.9 C) (Oral)   Ht 5' 7.5" (1.715 m)   Wt 219 lb (99.3 kg)   BMI 33.79 kg/m   Wt Readings from Last 3 Encounters:  08/08/18 219 lb (99.3 kg)  05/02/18 218 lb (98.9 kg)  05/02/18 218 lb (98.9 kg)    Physical Exam  Constitutional: He is oriented to person, place, and time. He appears well-developed and  well-nourished. No distress.  HENT:  Head: Normocephalic and atraumatic.  Eyes: Pupils are equal, round, and reactive to light. Conjunctivae and EOM are normal.  Neck: Normal range of motion. Neck supple. Carotid bruit is not present.  Cardiovascular: Normal rate, regular rhythm, S1 normal, S2 normal, normal heart sounds and intact distal pulses.  Pulmonary/Chest: Effort normal and breath sounds normal. No respiratory distress.  Abdominal: Soft. Bowel sounds are normal. He exhibits no distension. There is no hepatosplenomegaly. There is no tenderness. No hernia.  Musculoskeletal: He exhibits no edema (pedal).    Neurological: He is alert and oriented to person, place, and time.  Skin: Skin is warm and dry. Capillary refill takes less than 2 seconds.  Psychiatric: He has a normal mood and affect. His behavior is normal. Judgment and thought content normal.  Vitals reviewed.    Results for orders placed or performed in visit on 05/02/18  Lipid panel  Result Value Ref Range   Cholesterol 140 <200 mg/dL   HDL 34 (L) >40 mg/dL   Triglycerides 185 (H) <150 mg/dL   LDL Cholesterol (Calc) 78 mg/dL (calc)   Total CHOL/HDL Ratio 4.1 <5.0 (calc)   Non-HDL Cholesterol (Calc) 106 <130 mg/dL (calc)  TSH  Result Value Ref Range   TSH 1.99 0.40 - 4.50 mIU/L      Assessment & Plan:   Problem List Items Addressed This Visit      Cardiovascular and Mediastinum   Essential hypertension    Controlled and stable on medication.  Pt has improved diet and states he has felt much better.  He also continues regular physical activity.   Plan: 1. Continue lisinopril-hydrochlorothaizide 10-12.5 mg once daily. 2. Encouraged increased physical activity to 30 minutes most days of the week. 3. Encouraged DASH diet. 4. Follow up 6 months      Relevant Medications   lisinopril-hydrochlorothiazide (PRINZIDE,ZESTORETIC) 10-12.5 MG tablet   atorvastatin (LIPITOR) 20 MG tablet   terazosin (HYTRIN) 5 MG capsule   Other Relevant Orders   COMPLETE METABOLIC PANEL WITH GFR     Endocrine   Hypothyroidism    Previously stable.  Pt continues with correct administration regimen.  Plan: 1. Encouraged continued adherence to proper administration.  Continue levothyroxine 75 mcg once daily. 2. Repeat TSH today. 3. Follow up 6 months.      Relevant Orders   Lipid panel   TSH     Genitourinary   Benign prostatic hyperplasia with urinary obstruction    Stable urinary symptoms. Patient desires to continue terazosin. Refill provided. Follow-up 6 months and prn.      Relevant Medications   terazosin (HYTRIN) 5 MG  capsule     Other   Pre-diabetes - Primary    Well- controlled prediabetes  with A1c 5.6 stable from 6 months ago. - No known complications or hypoglycemia.  Plan:  1. Continue current therapy: diet and exercise 2. Encourage improved lifestyle: - low carb/low glycemic diet reinforced prior education - Increase physical activity to 30 minutes most days of the week.  Explained that increased physical activity increases body's use of sugar for energy. 3. Follow-up 6 months       Relevant Orders   POCT glycosylated hemoglobin (Hb A1C) (Completed)   Lipid panel   COMPLETE METABOLIC PANEL WITH GFR   Hypercholesteremia    Stable LDL, persistently low HDL, and elevated triglycerides at last check. Currently stable without signs and symptoms of ASCVD or events.   - Continue fish oil.  -  Recheck labs - if no significant change in triglycerides or any worsening of LDL, will stop fish oil. - Recommended ongoing healthy diet, low glycemic carbs, lower saturated fat, and increased physical activity. - Follow-up 6 months.       Relevant Medications   lisinopril-hydrochlorothiazide (PRINZIDE,ZESTORETIC) 10-12.5 MG tablet   atorvastatin (LIPITOR) 20 MG tablet   terazosin (HYTRIN) 5 MG capsule    Other Visit Diagnoses    Acute right-sided low back pain without sciatica       Pt continues to have intermittent acute back pain w muscle spasm.  Continue baclofen 10 mg one tab po prn daily.  Follow-up 6 mos.   Relevant Medications   baclofen (LIORESAL) 10 MG tablet   Needs flu shot       Relevant Orders   Flu vaccine HIGH DOSE PF (Fluzone High dose) (Completed)    Pt > age 62.  Needs annual influenza vaccine.  Plan: 1. Administer high dose fluzone today.     Meds ordered this encounter  Medications  . baclofen (LIORESAL) 10 MG tablet    Sig: Take 0.5 tablets (5 mg total) by mouth 2 (two) times daily as needed for muscle spasms (low back pain).    Dispense:  20 each    Refill:  1  .  lisinopril-hydrochlorothiazide (PRINZIDE,ZESTORETIC) 10-12.5 MG tablet    Sig: Take 1 tablet by mouth daily.    Dispense:  90 tablet    Refill:  2    Order Specific Question:   Supervising Provider    Answer:   Olin Hauser [[id number]]  . atorvastatin (LIPITOR) 20 MG tablet    Sig: Take 1 tablet (20 mg total) by mouth daily at 6 PM.    Dispense:  90 tablet    Refill:  2    Order Specific Question:   Supervising Provider    Answer:   Olin Hauser [[id number]]  . terazosin (HYTRIN) 5 MG capsule    Sig: Take 1 capsule (5 mg total) by mouth daily.    Dispense:  90 capsule    Refill:  2    Order Specific Question:   Supervising Provider    Answer:   Olin Hauser [[id number]]    Follow up plan: Return in about 6 months (around 02/06/2019) for pre-diabetes, hypertension, thyroid.  Cassell Smiles, DNP, AGPCNP-BC Adult Gerontology Primary Care Nurse Practitioner [facility] 08/08/2018, 9:28 AM

## 2018-08-08 NOTE — Patient Instructions (Addendum)
Joe Hardy,   Thank you for coming in to clinic today.  1. Continue blood pressure medications without changes  2. Continue your fish oil for your triglycerides unless we give you different advice after your labs.  3. Continue eating well.  You are doing very well today.  4. It is okay to take Tums or Pepcid if needed for occasional heartburn.  Please schedule a follow-up appointment with Cassell Smiles, AGNP. Return in about 6 months (around 02/06/2019) for pre-diabetes, hypertension, thyroid.  If you have any other questions or concerns, please feel free to call the clinic or send a message through [place]. You may also schedule an earlier appointment if necessary.  You will receive a survey after today's visit either digitally by e-mail or paper by [employer]. Your experiences and feedback matter to [place].  Please respond so we know how we are doing as we provide care for you.   Cassell Smiles, DNP, AGNP-BC Adult Gerontology Nurse Practitioner [facility], [facility]   Influenza (Flu) Vaccine (Inactivated or Recombinant): What You Need to Know 1. Why get vaccinated? Influenza ("flu") is a contagious disease that spreads around the [place] every year, usually between October and May. Flu is caused by influenza viruses, and is spread mainly by coughing, sneezing, and close contact. Anyone can get flu. Flu strikes suddenly and can last several days. Symptoms vary by age, but can include:  fever/chills  sore throat  muscle aches  fatigue  cough  headache  runny or stuffy nose  Flu can also lead to pneumonia and blood infections, and cause diarrhea and seizures in children. If you have a medical condition, such as heart or lung disease, flu can make it worse. Flu is more dangerous for some people. Infants and young children, people 66 years of age and older, pregnant women, and people with certain health conditions or a weakened immune system are at greatest  risk. Each year thousands of people in the [place] die from flu, and many more are hospitalized. Flu vaccine can:  keep you from getting flu,  make flu less severe if you do get it, and  keep you from spreading flu to your family and other people. 2. Inactivated and recombinant flu vaccines A dose of flu vaccine is recommended every flu season. Children 6 months through 47 years of age may need two doses during the same flu season. Everyone else needs only one dose each flu season. Some inactivated flu vaccines contain a very small amount of a mercury-based preservative called thimerosal. Studies have not shown thimerosal in vaccines to be harmful, but flu vaccines that do not contain thimerosal are available. There is no live flu virus in flu shots. They cannot cause the flu. There are many flu viruses, and they are always changing. Each year a new flu vaccine is made to protect against three or four viruses that are likely to cause disease in the upcoming flu season. But even when the vaccine doesn't exactly match these viruses, it may still provide some protection. Flu vaccine cannot prevent:  flu that is caused by a virus not covered by the vaccine, or  illnesses that look like flu but are not.  It takes about 2 weeks for protection to develop after vaccination, and protection lasts through the flu season. 3. Some people should not get this vaccine Tell the person who is giving you the vaccine:  If you have any severe, life-threatening allergies. If you ever had  a life-threatening allergic reaction after a dose of flu vaccine, or have a severe allergy to any part of this vaccine, you may be advised not to get vaccinated. Most, but not all, types of flu vaccine contain a small amount of egg protein.  If you ever had Guillain-Barr Syndrome (also called GBS). Some people with a history of GBS should not get this vaccine. This should be discussed with your doctor.  If you are not  feeling well. It is usually okay to get flu vaccine when you have a mild illness, but you might be asked to come back when you feel better.  4. Risks of a vaccine reaction With any medicine, including vaccines, there is a chance of reactions. These are usually mild and go away on their own, but serious reactions are also possible. Most people who get a flu shot do not have any problems with it. Minor problems following a flu shot include:  soreness, redness, or swelling where the shot was given  hoarseness  sore, red or itchy eyes  cough  fever  aches  headache  itching  fatigue  If these problems occur, they usually begin soon after the shot and last 1 or 2 days. More serious problems following a flu shot can include the following:  There may be a small increased risk of Guillain-Barre Syndrome (GBS) after inactivated flu vaccine. This risk has been estimated at 1 or 2 additional cases per million people vaccinated. This is much lower than the risk of severe complications from flu, which can be prevented by flu vaccine.  Young children who get the flu shot along with pneumococcal vaccine (PCV13) and/or DTaP vaccine at the same time might be slightly more likely to have a seizure caused by fever. Ask your doctor for more information. Tell your doctor if a child who is getting flu vaccine has ever had a seizure.  Problems that could happen after any injected vaccine:  People sometimes faint after a medical procedure, including vaccination. Sitting or lying down for about 15 minutes can help prevent fainting, and injuries caused by a fall. Tell your doctor if you feel dizzy, or have vision changes or ringing in the ears.  Some people get severe pain in the shoulder and have difficulty moving the arm where a shot was given. This happens very rarely.  Any medication can cause a severe allergic reaction. Such reactions from a vaccine are very rare, estimated at about 1 in a million  doses, and would happen within a few minutes to a few hours after the vaccination. As with any medicine, there is a very remote chance of a vaccine causing a serious injury or death. The safety of vaccines is always being monitored. For more information, visit: http://www.aguilar.org/ 5. What if there is a serious reaction? What should I look for? Look for anything that concerns you, such as signs of a severe allergic reaction, very high fever, or unusual behavior. Signs of a severe allergic reaction can include hives, swelling of the face and throat, difficulty breathing, a fast heartbeat, dizziness, and weakness. These would start a few minutes to a few hours after the vaccination. What should I do?  If you think it is a severe allergic reaction or other emergency that can't wait, call 9-1-1 and get the person to the nearest hospital. Otherwise, call your doctor.  Reactions should be reported to the Vaccine Adverse Event Reporting System (VAERS). Your doctor should file this report, or you can do  it yourself through the VAERS web site at www.vaers.SamedayNews.es, or by calling [phone]. ? VAERS does not give medical advice. 6. The National Vaccine Injury Compensation Program The [employer] Vaccine Injury Compensation Program (VICP) is a federal program that was created to compensate people who may have been injured by certain vaccines. Persons who believe they may have been injured by a vaccine can learn about the program and about filing a claim by calling (838) 146-9750 or visiting the [place] website at GoldCloset.com.ee. There is a time limit to file a claim for compensation. 7. How can I learn more?  Ask your healthcare provider. He or she can give you the vaccine package insert or suggest other sources of information.  Call your local or state health department.  Contact the Centers for Disease Control and Prevention (CDC): ? Call (628) 161-0568 (1-800-CDC-INFO) or ? Visit  CDC's website at https://gibson.com/ Vaccine Information Statement, Inactivated Influenza Vaccine (04/19/2014) This information is not intended to replace advice given to you by your health care provider. Make sure you discuss any questions you have with your health care provider. Document Released: 06/24/2006 Document Revised: 05/20/2016 Document Reviewed: 05/20/2016 Elsevier Interactive Patient Education  2017 [employer].

## 2018-08-08 NOTE — Assessment & Plan Note (Signed)
Stable LDL, persistently low HDL, and elevated triglycerides at last check. Currently stable without signs and symptoms of ASCVD or events.   - Continue fish oil.  - Recheck labs - if no significant change in triglycerides or any worsening of LDL, will stop fish oil. - Recommended ongoing healthy diet, low glycemic carbs, lower saturated fat, and increased physical activity. - Follow-up 6 months.

## 2018-08-08 NOTE — Assessment & Plan Note (Signed)
Previously stable.  Pt continues with correct administration regimen.  Plan: 1. Encouraged continued adherence to proper administration.  Continue levothyroxine 75 mcg once daily. 2. Repeat TSH today. 3. Follow up 6 months.

## 2018-08-08 NOTE — Assessment & Plan Note (Signed)
Controlled and stable on medication.  Pt has improved diet and states he has felt much better.  He also continues regular physical activity.   Plan: 1. Continue lisinopril-hydrochlorothaizide 10-12.5 mg once daily. 2. Encouraged increased physical activity to 30 minutes most days of the week. 3. Encouraged DASH diet. 4. Follow up 6 months

## 2018-08-08 NOTE — Assessment & Plan Note (Signed)
Stable urinary symptoms. Patient desires to continue terazosin. Refill provided. Follow-up 6 months and prn.

## 2018-08-21 ENCOUNTER — Other Ambulatory Visit: Payer: Self-pay

## 2018-08-21 DIAGNOSIS — M545 Low back pain, unspecified: Secondary | ICD-10-CM

## 2018-08-21 MED ORDER — BACLOFEN 10 MG PO TABS: 5.0000 mg | ORAL_TABLET | Freq: Two times a day (BID) | ORAL | 1 refills | Status: DC | PRN

## 2018-08-21 NOTE — Telephone Encounter (Signed)
Patient called reporting that his baclofen was not sent to [facility].  According to patients chart it was sent on 08/08/18.  Can you please resend or call optum.

## 2018-12-15 ENCOUNTER — Ambulatory Visit (INDEPENDENT_AMBULATORY_CARE_PROVIDER_SITE_OTHER): Payer: Medicare Other | Admitting: Family Medicine

## 2018-12-15 ENCOUNTER — Encounter: Payer: Self-pay | Admitting: Family Medicine

## 2018-12-15 ENCOUNTER — Other Ambulatory Visit: Payer: Self-pay

## 2018-12-15 DIAGNOSIS — J392 Other diseases of pharynx: Secondary | ICD-10-CM | POA: Diagnosis not present

## 2018-12-15 DIAGNOSIS — R509 Fever, unspecified: Secondary | ICD-10-CM | POA: Diagnosis not present

## 2018-12-15 NOTE — Progress Notes (Signed)
Virtual Visit via Telephone The purpose of this virtual visit is to provide medical care while limiting exposure to the novel coronavirus (COVID19) for both patient and office staff.  Consent was obtained for phone visit:  Yes.   Answered questions that patient had about telehealth interaction:  Yes.   I discussed the limitations, risks, security and privacy concerns of performing an evaluation and management service by telephone. I also discussed with the patient that there may be a patient responsible charge related to this service. The patient expressed understanding and agreed to proceed.  Patient Location: Home Provider Location: [facility])   ---------------------------------------------------------------------- Chief Complaint  Patient presents with  . Fever    as per patient had cold on Wednesday, low grade fever, itchy throat denies cough or SOB onset 3 day    S: Reviewed CMA telephone note below. I have called patient and gathered additional HPI as follows:  Reports that symptoms started 3 days ago, he was very active working around his home and he was sweating and he did not change his cloths he left them on even though had some sweat. Next day he developed a very mild itchy scratchy throat, he did not seem to get worse, still described persistent itchy throat for about 24 hours. He did go out to the grocery store. Next day yesterday he described he felt a "low grade fever". - Today he admits that the throat is still persistent itchy. He felt better when woke up, but then developed symptoms of subjective fever but he cannot check it, he is concerned about possible COVID19 - Not taking any OTC medications or anti-pyretic - He decided to drink more fluids, eat more fruit and vegetables to help his immune system - He is a former smoker intermittently over past 50+ years, he is former smoker now quit 2 years ago, no history of COPD or asthma. Or immune  deficiency - In past he has episodic itching in throat and cough due to Lisinopril in past, does not always cause symptoms  Patient currently in home isolation - except 2 trips to [facility] store 11/28/18, and 12/13/18 - he has avoided any and all close contact Denies any high risk travel to areas of current concern for COVID19. Denies any known or suspected exposure to person with or possibly with COVID19.  Denies any chills, sweats, body ache, cough, shortness of breath, sinus pain or pressure, headache, abdominal pain, diarrhea   Social History   Tobacco Use  . Smoking status: Former Smoker    Packs/day: 1.50    Years: 20.00    Pack years: 30.00    Types: Cigarettes    Last attempt to quit: 05/02/2017    Years since quitting: 1.6  . Smokeless tobacco: Former Systems developer  . Tobacco comment: Starts and stops related to anxiety rather than drinking alcohol  Substance Use Topics  . 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 Drug use: No    -------------------------------------------------------------------------- O: No physical exam performed due to remote telephone encounter.  -------------------------------------------------------------------------- A&P:  Suspected Acute Viral URI vs Allergic Rhinitis vs Hay Fever Unable to rule out COVID19 - could be mild symptom case - but has had very limited exposure - Reassuring without high risk symptoms - Afebrile, without dyspnea - Only ageNo comorbid pulmonary conditions (asthma, COPD) or immunocompromise  - Currently patient is LOW RISK for COVID19 based on current symptoms  and no known travel/exposure - however at this time, now [place] is currently within phase of community spread, and therefore patient can still be potentially exposed. Cannot rule out case with mild symptoms. Testing is not recommended at this time due to mild symptoms.  1. Reassurance - today discussed and  answered all of his questions regarding COVID19 to best of my ability and current knowledge base of the situation 2. May monitor his symptoms closely for now 3. If needed can take Tylenol PRN for fever, avoid Ibuprofen / Advil / NSAID 4. May keep improving diet and hydration as he is  No orders of the defined types were placed in this encounter.  OPTIONAL RECOMMENDED self quarantine for patient safety for PREVENTION ONLY. It is not required based on current clinical symptoms. If they were to develop fever or worsening shortness of breath, then emphasis on REQUIRED quarantine for up to 7-14 days that could be resolved if fever free >3 days AND if symptoms improving after 7 days.   If symptoms do not resolve or significantly improve OR if WORSENING - fever / cough - or worsening shortness of breath - then should contact us and seek advice on next steps in treatment at home vs where/when to seek care at Urgent Care or Hospital ED for further intervention and possible testing if indicated.  Patient verbalizes understanding with the above medical recommendations including the limitation of remote medical advice.  Specific follow-up / call-back criteria were given for patient to follow-up or seek medical care more urgently if needed.  - Time spent in direct consultation with patient on phone: 18 minutes  Nobie Putnam, [facility] 12/15/2018, 3:10 PM

## 2018-12-15 NOTE — Patient Instructions (Addendum)
I am reassured after hearing your symptoms.  I would recommend continue to monitor your symptoms - if you are worsening with fever, shortness of breath, and cough worsening.  If you do need a medicine for fever - I recommend Acetaminophen (Tylenol) 500mg  - may take 1-2 pills per dose up to every 6 to 8 hours or 3 times a day.  Do NOT take Ibuprofen, Advil, Aleve these medicines can interfere potentially with COVID19.  Keep isolated to avoid exposure as you are for now.  If you have any other questions or concerns, please feel free to call the office or send a message through [facility]. You may also schedule an earlier appointment if necessary.  Additionally, you may be receiving a survey about your experience at our office within a few days to 1 week by e-mail or mail. We value your feedback.  Nobie Putnam, DO [place]

## 2018-12-20 ENCOUNTER — Other Ambulatory Visit: Payer: Self-pay | Admitting: Nurse Practitioner

## 2018-12-20 DIAGNOSIS — E034 Atrophy of thyroid (acquired): Secondary | ICD-10-CM

## 2018-12-20 DIAGNOSIS — M545 Low back pain, unspecified: Secondary | ICD-10-CM

## 2018-12-20 MED ORDER — BACLOFEN 10 MG PO TABS: 5.0000 mg | ORAL_TABLET | Freq: Two times a day (BID) | ORAL | 0 refills | Status: DC | PRN

## 2018-12-20 MED ORDER — LEVOTHYROXINE SODIUM 75 MCG PO TABS: ORAL_TABLET | ORAL | 0 refills | Status: DC

## 2018-12-20 NOTE — Telephone Encounter (Signed)
Pt called requesting refill  lBaclofen, Levothroid mail order

## 2019-01-10 IMAGING — US US CAROTID DUPLEX BILAT
1 series · 13 of 24 positions shown · non-contrast
Comparison: 05/08/2015

CLINICAL DATA: Left neck bruit

EXAM:
BILATERAL CAROTID DUPLEX ULTRASOUND
TECHNIQUE: Gray scale imaging, color Doppler and duplex ultrasound were
performed of bilateral carotid and vertebral arteries in the neck.

[Series 1: us carotid duplex bilat · 0.07mm/px · 13 of 64 slices shown]
[im 1/64]
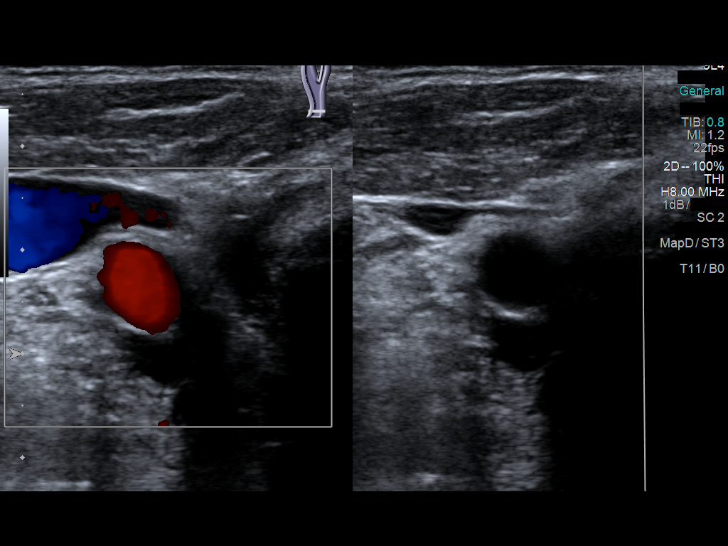
[im 6/64]
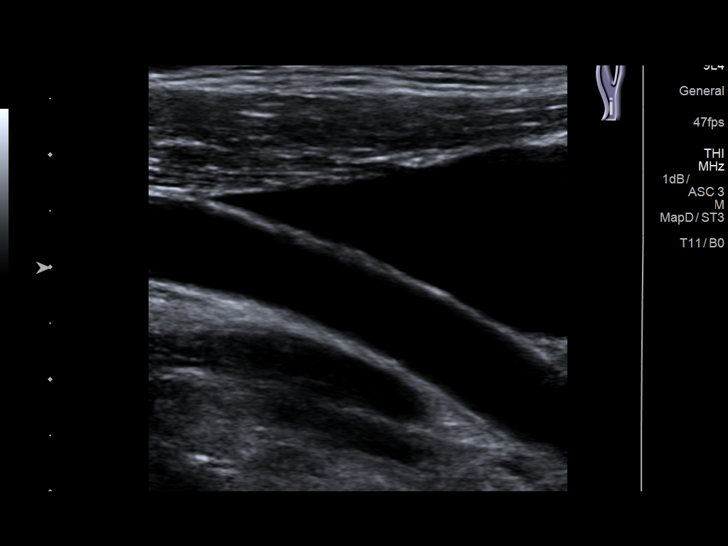
[im 11/64]
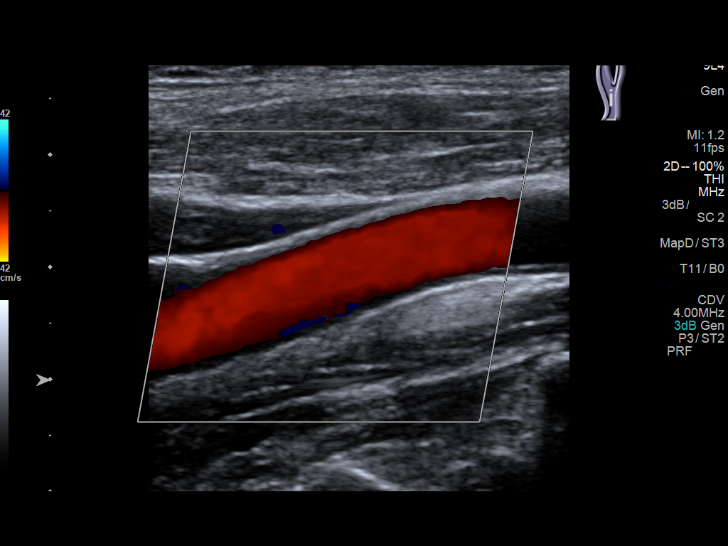
[im 17/64]
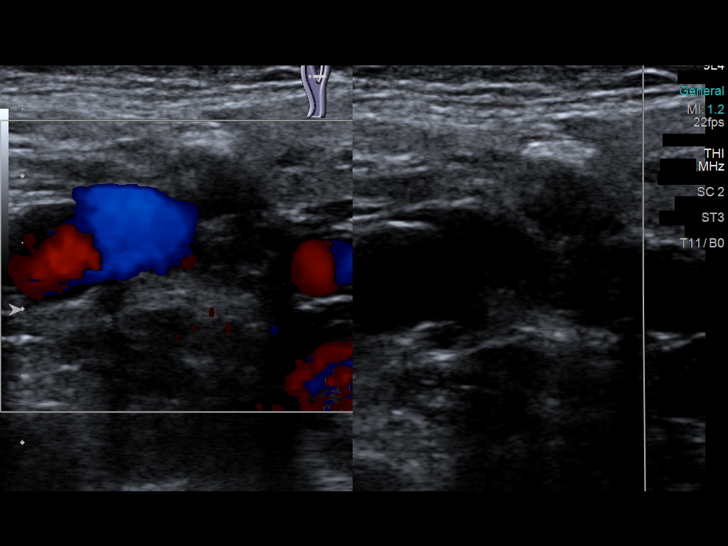
[im 22/64]
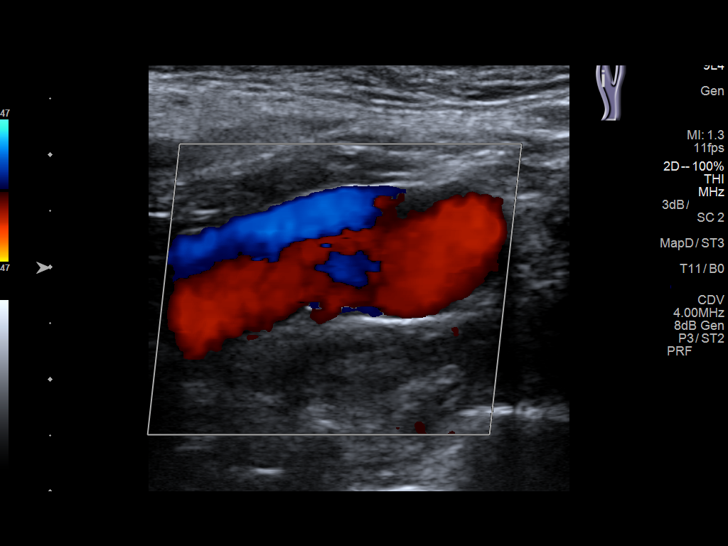
[im 28/64]
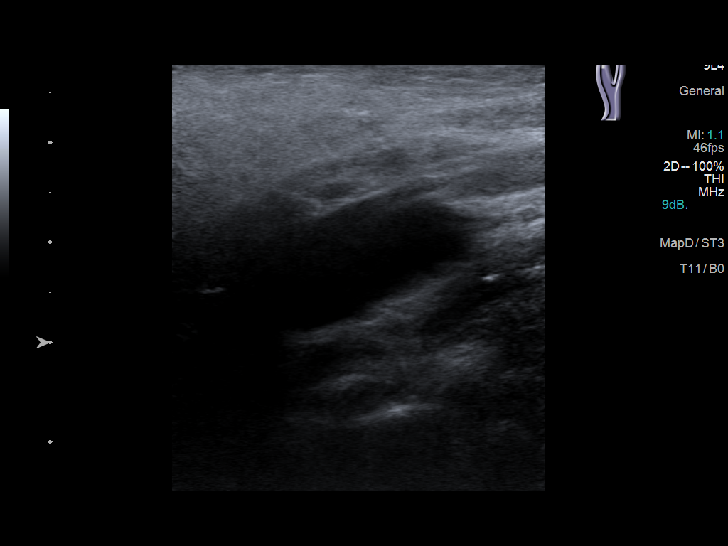
[im 33/64]
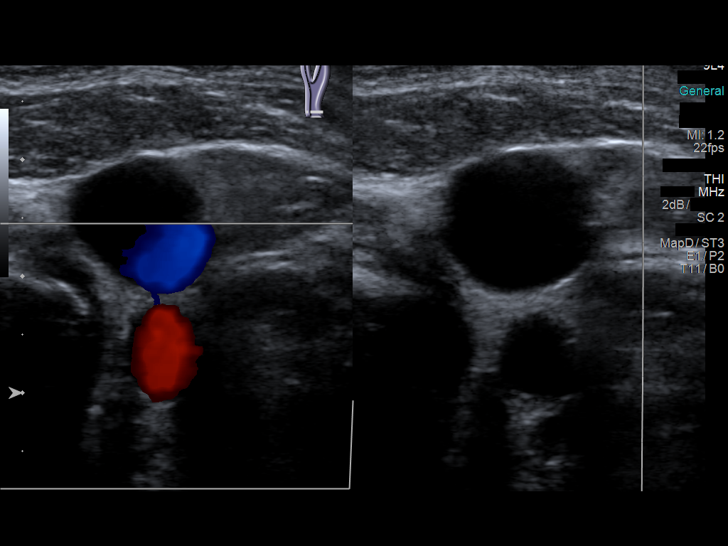
[im 36/64]
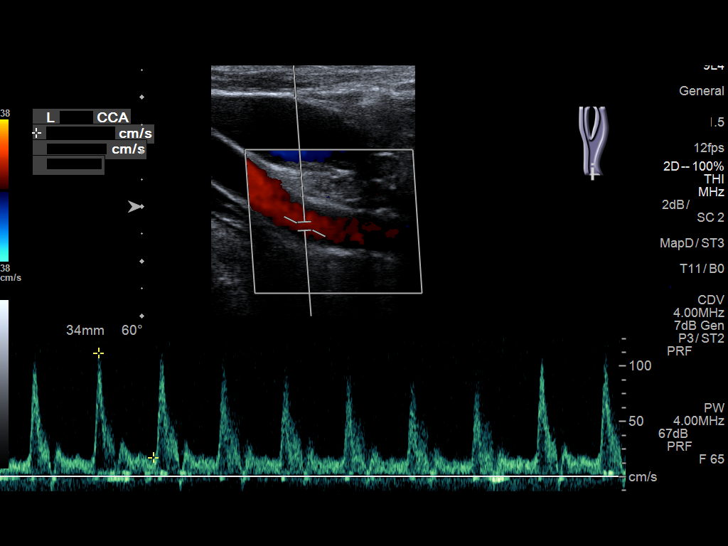
[im 42/64]
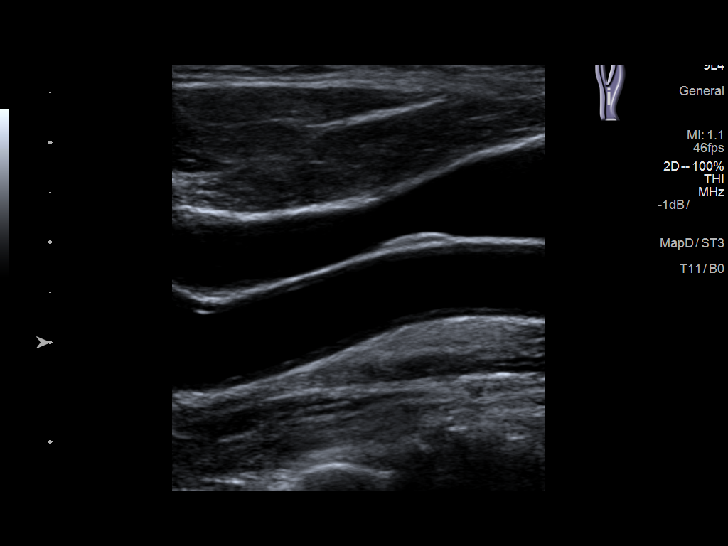
[im 47/64]
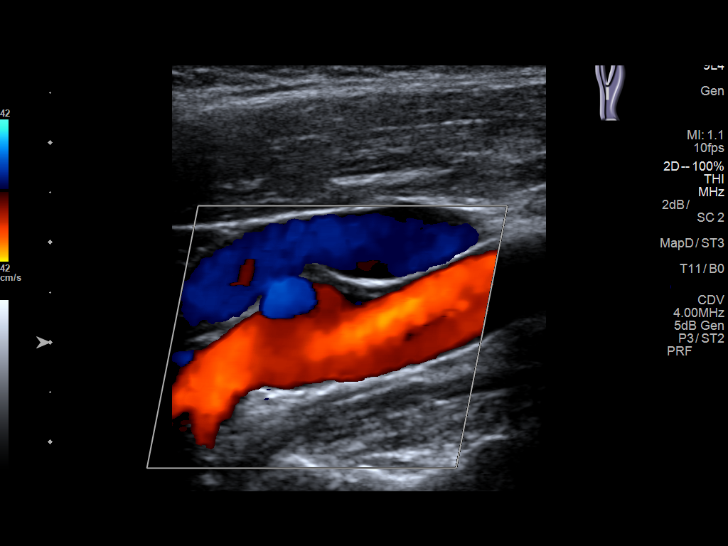
[im 53/64]
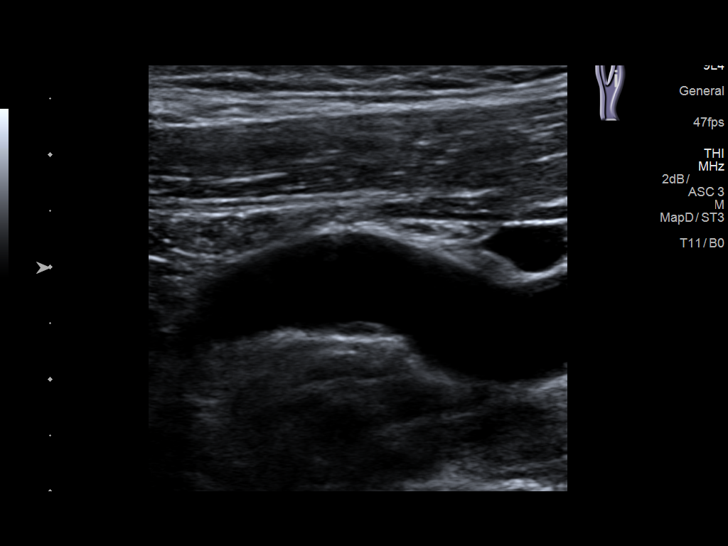
[im 58/64]
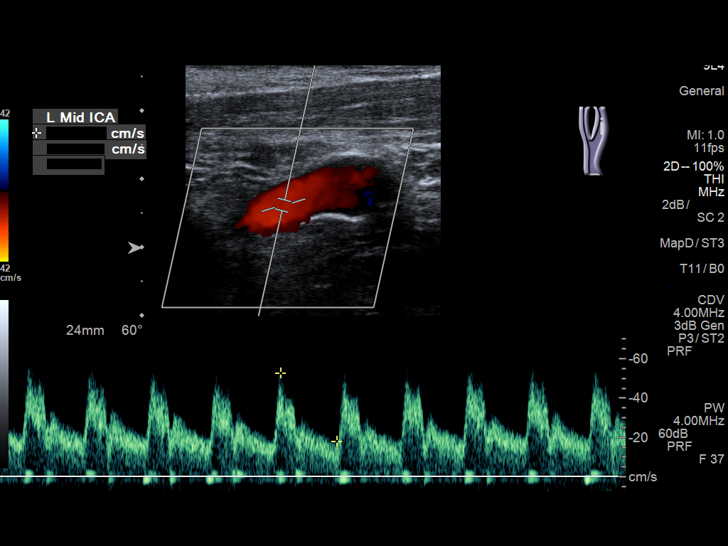
[im 64/64]
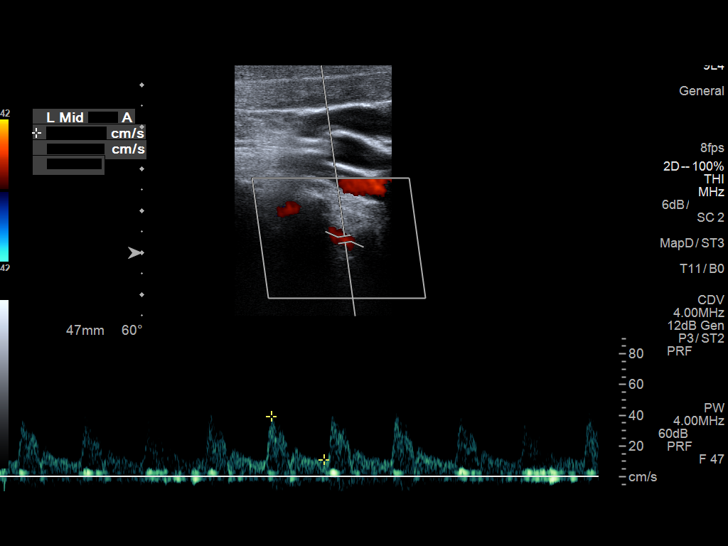

[13 of 24 positions shown; findings below may reference images not displayed]

FINDINGS: Criteria: Quantification of carotid stenosis is based on velocity
parameters that correlate the residual internal carotid diameter
with NASCET-based stenosis levels, using the diameter of the distal
internal carotid lumen as the denominator for stenosis measurement.

The following velocity measurements were obtained:

RIGHT

ICA:  69 cm/sec

CCA:  122 cm/sec

SYSTOLIC ICA/CCA RATIO:

DIASTOLIC ICA/CCA RATIO:

ECA:  72 cm/sec

LEFT

ICA:  70 cm/sec

CCA:  119 cm/sec

SYSTOLIC ICA/CCA RATIO:

DIASTOLIC ICA/CCA RATIO:

ECA:  70 cm/sec

RIGHT CAROTID ARTERY: Little if any plaque in the bulb. Mild
calcified plaque at the origin of the external carotid artery.

RIGHT VERTEBRAL ARTERY:  Antegrade.

LEFT CAROTID ARTERY: Little if any plaque in the bulb. Low
resistance internal carotid Doppler pattern is preserved.

LEFT VERTEBRAL ARTERY:  Antegrade.
IMPRESSION: Less than 50% stenosis in the right and left internal carotid
artery.

## 2019-01-30 ENCOUNTER — Ambulatory Visit: Payer: Medicare Other | Admitting: Nurse Practitioner

## 2019-01-30 ENCOUNTER — Encounter: Payer: Self-pay | Admitting: Family Medicine

## 2019-01-30 ENCOUNTER — Other Ambulatory Visit: Payer: Self-pay

## 2019-01-30 ENCOUNTER — Ambulatory Visit (INDEPENDENT_AMBULATORY_CARE_PROVIDER_SITE_OTHER): Payer: Medicare Other | Admitting: Family Medicine

## 2019-01-30 DIAGNOSIS — N401 Enlarged prostate with lower urinary tract symptoms: Secondary | ICD-10-CM

## 2019-01-30 DIAGNOSIS — R7303 Prediabetes: Secondary | ICD-10-CM

## 2019-01-30 DIAGNOSIS — E039 Hypothyroidism, unspecified: Secondary | ICD-10-CM | POA: Diagnosis not present

## 2019-01-30 DIAGNOSIS — N138 Other obstructive and reflux uropathy: Secondary | ICD-10-CM

## 2019-01-30 DIAGNOSIS — I10 Essential (primary) hypertension: Secondary | ICD-10-CM | POA: Diagnosis not present

## 2019-01-30 NOTE — Assessment & Plan Note (Signed)
Stable chronic BPH On alpha blocker terazosin, with improvement

## 2019-01-30 NOTE — Progress Notes (Signed)
Virtual Visit via Telephone The purpose of this virtual visit is to provide medical care while limiting exposure to the novel coronavirus (COVID19) for both patient and office staff.  Consent was obtained for phone visit:  Yes.   Answered questions that patient had about telehealth interaction:  Yes.   I discussed the limitations, risks, security and privacy concerns of performing an evaluation and management service by telephone. I also discussed with the patient that there may be a patient responsible charge related to this service. The patient expressed understanding and agreed to proceed.  Patient Location: Home Provider Location: [facility] (Office)  PCP is Cassell Smiles, AGPCNP-BC - I am currently covering during her maternity leave.   ---------------------------------------------------------------------- Chief Complaint  Patient presents with  . Hypertension    S: Reviewed CMA documentation. I have called patient and gathered additional HPI as follows:  Pre-Diabetes Reports no concerns. Last lab A1c 5.6 (07/2018), did not  CBGs: Not checking CBG Meds: None Currently on ACEi Lifestyle: - Diet (improving)  Denies hypoglycemia, polyuria, visual changes, numbness or tingling.  CHRONIC HTN / BPH Reports no new concerns, thinks his BP has improved. He usually can tell if it is higher. He does not check Bp regularly. Current Meds - Lisinopril-HCTZ 10-12.5mg  daily, Terazosin 5mg  daily (for LUTS) Reports good compliance, took meds today. Tolerating well, w/o complaints. Denies CP, dyspnea, HA, edema, dizziness / lightheadedness  Hypothyroidism Reviewed last TSH 1.9 to 2.3 in past >6-9 months. Did not have labs prior to this appointment. - He has had long history of hypothyroidism - He takes Levothyroxine 49mcg daily, with good results No new concerns Feels improved energy and less fatigue  Additional history: 5th Little Toe suspected non displaced fracture -  He says broke little toe recently accidentally he was crouched down and stood up and little toe was caught and may have broken, he noticed bruising and swelling now improved, reduced pain.  - He has added Vitamin D supplement and has improved.  - Insomnia: He has chronic history of depression, also insomnia, he takes Trazodone 150mg  nightly   Denies any high risk travel to areas of current concern for COVID19. Denies any known or suspected exposure to person with or possibly with COVID19.  Denies any fevers, chills, sweats, body ache, cough, shortness of breath, sinus pain or pressure, headache, abdominal pain, diarrhea  Past Medical History:  Diagnosis Date  . Depression   . GERD (gastroesophageal reflux disease)   . Hyperlipidemia   . Hypertension   . Migraine   . Sleep apnea    Social History   Tobacco Use  . Smoking status: Former Smoker    Packs/day: 1.50    Years: 20.00    Pack years: 30.00    Types: Cigarettes    Last attempt to quit: 05/02/2017    Years since quitting: 1.7  . Smokeless tobacco: Former Systems developer  . Tobacco comment: Starts and stops related to anxiety rather than drinking alcohol  Substance Use Topics  . 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 Drug use: No    Current Outpatient Medications:  .  Ascorbic Acid (VITAMIN C) 1000 MG tablet, Take 1,000 mg by mouth daily., Disp: , Rfl:  .  atorvastatin (LIPITOR) 20 MG tablet, Take 1 tablet (20 mg total) by mouth daily at 6 PM., Disp: 90 tablet, Rfl: 2 .  b complex vitamins tablet, Take 1  tablet by mouth daily., Disp: , Rfl:  .  baclofen (LIORESAL) 10 MG tablet, Take 0.5 tablets (5 mg total) by mouth 2 (two) times daily as needed for muscle spasms (low back pain)., Disp: 60 each, Rfl: 0 .  Blood Pressure Monitoring (SM WRIST CUFF BP MONITOR) MISC, 1 Units by Does not apply route 2 (two) times a week., Disp: 1 each, Rfl: 0 .  buPROPion (WELLBUTRIN  XL) 150 MG 24 hr tablet, Take 150 mg by mouth 3 (three) times daily. , Disp: , Rfl:  .  cholecalciferol (VITAMIN D3) 25 MCG (1000 UT) tablet, Take 1,000 Units by mouth daily., Disp: , Rfl:  .  cyanocobalamin 1000 MCG tablet, Take 1,000 mcg by mouth daily., Disp: , Rfl:  .  ferrous sulfate 325 (65 FE) MG tablet, Take 325 mg by mouth daily with breakfast., Disp: , Rfl:  .  Flaxseed, Linseed, (FLAX SEEDS PO), Take by mouth., Disp: , Rfl:  .  Iron Combinations (CHROMAGEN PO), Take by mouth., Disp: , Rfl:  .  levothyroxine (SYNTHROID, LEVOTHROID) 75 MCG tablet, TAKE 1 TABLET BY MOUTH  DAILY BEFORE BREAKFAST, Disp: 90 tablet, Rfl: 0 .  lisinopril-hydrochlorothiazide (PRINZIDE,ZESTORETIC) 10-12.5 MG tablet, Take 1 tablet by mouth daily., Disp: 90 tablet, Rfl: 2 .  magnesium 30 MG tablet, Take 30 mg by mouth 2 (two) times daily., Disp: , Rfl:  .  mirtazapine (REMERON) 30 MG tablet, Take 30 mg by mouth at bedtime. , Disp: , Rfl:  .  Multiple Vitamin (MULTIVITAMIN WITH MINERALS) TABS tablet, Take 1 tablet by mouth daily., Disp: , Rfl:  .  Omega-3 Fatty Acids (FISH OIL) 1200 MG CAPS, Take by mouth., Disp: , Rfl:  .  terazosin (HYTRIN) 5 MG capsule, Take 1 capsule (5 mg total) by mouth daily., Disp: 90 capsule, Rfl: 2 .  traZODone (DESYREL) 150 MG tablet, Take 150 mg by mouth at bedtime. , Disp: , Rfl:  .  TURMERIC PO, Take by mouth., Disp: , Rfl:   Depression screen [facility] 2/9 12/15/2018 08/08/2018 05/02/2018  Decreased Interest 0 0 0  Down, Depressed, Hopeless 0 0 0  PHQ - 2 Score 0 0 0  Altered sleeping - 1 -  Tired, decreased energy - 0 -  Change in appetite - 1 -  Feeling bad or failure about yourself  - 0 -  Trouble concentrating - 0 -  Moving slowly or fidgety/restless - 0 -  Suicidal thoughts - 0 -  PHQ-9 Score - 2 -  Difficult doing work/chores - - -    No flowsheet data found.  -------------------------------------------------------------------------- O: No physical exam performed due to  remote telephone encounter.  Lab results reviewed.  No results found for this or any previous visit (from the past 2160 hour(s)).  -------------------------------------------------------------------------- A&P:  Problem List Items Addressed This Visit    Benign prostatic hyperplasia with urinary obstruction    Stable chronic BPH On alpha blocker terazosin, with improvement      Essential hypertension - Primary    Controlled HTN - Home BP readings none  No known complications     Plan:  1. Continue current BP regimen - Lisinopril-HCTZ 10-12.5mg  daily 2. Encourage improved lifestyle - low sodium diet, regular exercise 3. Check BP outside office if able at pharmacy       Hypothyroidism    Clinically stable hypothyroidism Last lab 07/2018, normal TSH range Continue current dose Levothyroxine 44mcg daily Follow-up 6 months w/ annual and labs - limited transportation will not return  for labs now      Pre-diabetes    Previously controlled Pre-DM A1c last 5.6 in 07/2018 Continue maintain healthy lifestyle, encourage low carb, low sugar, regular exercise improvement F/u 6 months A1c         No orders of the defined types were placed in this encounter.   Follow-up: - Return in 6 months for Annual Physical with PCP - he has transportation issues, may need labs same day  Patient verbalizes understanding with the above medical recommendations including the limitation of remote medical advice.  Specific follow-up and call-back criteria were given for patient to follow-up or seek medical care more urgently if needed.   - Time spent in direct consultation with patient on phone: 17 minutes  Nobie Putnam, [facility] 01/30/2019, 1:30 PM

## 2019-01-30 NOTE — Patient Instructions (Addendum)
Blood sugar, Thyroid and blood pressure seem to be well controlled  Continue same medicine. Re-check in 6 months   DUE for FASTING BLOOD WORK (no food or drink after midnight before the lab appointment, only water or coffee without cream/sugar on the morning of)  SCHEDULE "Lab Only" visit in the morning at the clinic for lab draw in 6 MONTHS   - Make sure Lab Only appointment is at about 1 week before your next appointment, so that results will be available  For Lab Results, once available within 2-3 days of blood draw, you can can log in to MyChart online to view your results and a brief explanation. Also, we can discuss results at next follow-up visit.   Please schedule a Follow-up Appointment to: Return in about 6 months (around 08/02/2019) for Annual Physical.  If you have any other questions or concerns, please feel free to call the office or send a message through [facility]. You may also schedule an earlier appointment if necessary.  Additionally, you may be receiving a survey about your experience at our office within a few days to 1 week by e-mail or mail. We value your feedback.  Nobie Putnam, DO [place]

## 2019-01-30 NOTE — Assessment & Plan Note (Addendum)
Controlled HTN - Home BP readings none  No known complications     Plan:  1. Continue current BP regimen - Lisinopril-HCTZ 10-12.5mg  daily 2. Encourage improved lifestyle - low sodium diet, regular exercise 3. Check BP outside office if able at pharmacy

## 2019-01-30 NOTE — Assessment & Plan Note (Signed)
Clinically stable hypothyroidism Last lab 07/2018, normal TSH range Continue current dose Levothyroxine 68mcg daily Follow-up 6 months w/ annual and labs - limited transportation will not return for labs now

## 2019-01-30 NOTE — Assessment & Plan Note (Signed)
Previously controlled Pre-DM A1c last 5.6 in 07/2018 Continue maintain healthy lifestyle, encourage low carb, low sugar, regular exercise improvement F/u 6 months A1c

## 2019-02-06 ENCOUNTER — Ambulatory Visit: Payer: Medicare Other | Admitting: Nurse Practitioner

## 2019-02-20 ENCOUNTER — Other Ambulatory Visit: Payer: Self-pay | Admitting: Nurse Practitioner

## 2019-02-20 DIAGNOSIS — E034 Atrophy of thyroid (acquired): Secondary | ICD-10-CM

## 2019-03-23 ENCOUNTER — Other Ambulatory Visit: Payer: Self-pay | Admitting: Nurse Practitioner

## 2019-03-23 DIAGNOSIS — M545 Low back pain, unspecified: Secondary | ICD-10-CM

## 2019-03-23 DIAGNOSIS — E034 Atrophy of thyroid (acquired): Secondary | ICD-10-CM

## 2019-03-23 MED ORDER — BACLOFEN 10 MG PO TABS: 5.0000 mg | ORAL_TABLET | Freq: Two times a day (BID) | ORAL | 0 refills | Status: DC | PRN

## 2019-03-23 MED ORDER — LEVOTHYROXINE SODIUM 75 MCG PO TABS: 75.0000 ug | ORAL_TABLET | Freq: Every day | ORAL | 0 refills | Status: DC

## 2019-03-23 NOTE — Telephone Encounter (Signed)
[facility]  baclofen (LIORESAL) 10 MG tablet [[id number]]   levothyroxine (SYNTHROID) 75 MCG tablet [[id number]

## 2019-05-08 ENCOUNTER — Ambulatory Visit (INDEPENDENT_AMBULATORY_CARE_PROVIDER_SITE_OTHER): Payer: Medicare Other

## 2019-05-08 ENCOUNTER — Telehealth: Payer: Self-pay | Admitting: *Deleted

## 2019-05-08 VITALS — Ht 68.0 in | Wt 207.0 lb

## 2019-05-08 DIAGNOSIS — Z87891 Personal history of nicotine dependence: Secondary | ICD-10-CM

## 2019-05-08 DIAGNOSIS — Z0000 Encounter for general adult medical examination without abnormal findings: Secondary | ICD-10-CM | POA: Diagnosis not present

## 2019-05-08 DIAGNOSIS — Z122 Encounter for screening for malignant neoplasm of respiratory organs: Secondary | ICD-10-CM

## 2019-05-08 NOTE — Patient Instructions (Addendum)
Mr. Nethery , Thank you for taking time to come for yourMedicare Wellness Visit. I appreciate your ongoing commitment to your health goals. Please review the following plan we discussed and let me know if I can assist you in the future.   Screening recommendations/referrals: Colonoscopy: completed 09/2014, no longer required  Recommended yearly ophthalmology/optometry visit for glaucoma screening and checkup Recommended yearly dental visit for hygiene and checkup  Vaccinations: Influenza vaccine: due 05/2019 Pneumococcal vaccine: completed series Tdap vaccine: up to date Shingles vaccine: shingrix eligilble, check with your insurance company for coverage    Advanced directives: Please bring a copy of your health care power of attorney and living will to the office at your convenience.  Conditions/risks identified: discussed lung cancer screening- Burgess Estelle, RN will contact you.   Discussed anxiety- keep up breathing techniques, call if you feel your anxiety worsens.   Next appointment: Follow up in one year for your annual wellness exam.   Preventive Care 78 Years and Older, Male  Preventive care refers to lifestyle choices and visits with your health care provider that can promote health and wellness. What does preventive care include?  A yearly physical exam. This is also called an annual well check.  Dental exams once or twice a year.  Routine eye exams. Ask your health care provider how often you should have your eyes checked.  Personal lifestyle choices, including: ? Daily care of your teeth and gums. ? Regular physical activity. ? Eating a healthy diet. ? Avoiding tobacco and drug use. ? Limiting alcohol use. ? Practicing safe sex. ? Taking low doses of aspirin every day. ? Taking vitamin and mineral supplements as recommended by your health care provider. What happens during an annual well check? The services and screenings done by your health care provider  during your annual well check will depend on your age, overall health, lifestyle risk factors, and family history of disease. Counseling  Your health care provider may ask you questions about your:  Alcohol use.  Tobacco use.  Drug use.  Emotional well-being.  Home and relationship well-being.  Sexual activity.  Eating habits.  History of falls.  Memory and ability to understand (cognition).  Work and work Statistician. Screening  You may have the following tests or measurements:  Height, weight, and BMI.  Blood pressure.  Lipid and cholesterol levels. These may be checked every 5 years, or more frequently if you are over 78 years old.  Skin check.  Lung cancer screening. You may have this screening every year starting at age 78 if you have a 30-pack-year history of smoking and currently smoke or have quit within the past 15 years.  Fecal occult blood test (FOBT) of the stool. You may have this test every year starting at age 78.  Flexible sigmoidoscopy or colonoscopy. You may have a sigmoidoscopy every 5 years or a colonoscopy every 10 years starting at age 78.  Prostate cancer screening. Recommendations will vary depending on your family history and other risks.  Hepatitis C blood test.  Hepatitis B blood test.  Sexually transmitted disease (STD) testing.  Diabetes screening. This is done by checking your blood sugar (glucose) after you have not eaten for a while (fasting). You may have this done every 1-3 years.  Abdominal aortic aneurysm (AAA) screening. You may need this if you are a current or former smoker.  Osteoporosis. You may be screened starting at age 78 if you are at high risk. Talk with your health care  provider about your test results, treatment options, and if necessary, the need for more tests. Vaccines   Your health care provider may recommend certain vaccines, such as:  Influenza vaccine. This is recommended every year.  Tetanus,  diphtheria, and acellular pertussis (Tdap, Td) vaccine. You may need a Td booster every 10 years.  Zoster vaccine. You may need this after age 78.  Pneumococcal 13-valent conjugate (PCV13) vaccine. One dose is recommended after age 78.  Pneumococcal polysaccharide (PPSV23) vaccine. One dose is recommended after age 78. Talk to your health care provider about which screenings and vaccines you need and how often you need them. This information is not intended to replace advice given to you by your health care provider. Make sure you discuss any questions you have with your health care provider. Document Released: 09/26/2015 Document Revised: 05/19/2016 Document Reviewed: 07/01/2015 Elsevier Interactive Patient Education  2017 [employer].

## 2019-05-08 NOTE — Telephone Encounter (Signed)
Received referral for initial lung cancer screening scan. Contacted patient and obtained smoking history,(former, quit 02/2018, 80 pack year) as well as answering questions related to screening process. Patient denies signs of lung cancer such as weight loss or hemoptysis. Patient denies comorbidity that would prevent curative treatment if lung cancer were found. Patient is scheduled for shared decision making visit and CT scan on TBD due to patient preference.

## 2019-05-08 NOTE — Progress Notes (Signed)
Subjective:   Joe Hardy is a 78 y.o. male who presents for Medicare Annual/Subsequent preventive examination.  This visit is being conducted via phone call  - after an attmept to do on video chat - due to the COVID-19 pandemic. This patient has given me verbal consent via phone to conduct this visit, patient states they are participating from their home address. Some vital signs may be absent or patient reported.   Patient identification: identified by name, DOB, and current address.    Review of Systems:   Cardiac Risk Factors include: advanced age (>50men, >24 women);male gender;hypertension;dyslipidemia;obesity (BMI >30kg/m2)     Objective:    Vitals: Ht 5\' 8"  (1.727 m) Comment: pt reported  Wt 207 lb (93.9 kg) Comment: pt reported  BMI 31.47 kg/m   Body mass index is 31.47 kg/m.  Advanced Directives 05/08/2019 05/02/2018 04/05/2017 05/13/2016 04/14/2015 04/14/2015  Does Patient Have a Medical Advance Directive? No Yes No No No Yes  Type of Advance Directive - Living will;Healthcare Power of Attorney - - - -  Does patient want to make changes to medical advance directive? - - Yes (MAU/Ambulatory/Procedural Areas - Information given) - - -  Copy of [place] in Chart? - No - copy requested - - - -  Would patient like information on creating a medical advance directive? - - - Yes - Scientist, clinical (histocompatibility and immunogenetics) given Yes - Scientist, clinical (histocompatibility and immunogenetics) given -    Tobacco Social History   Tobacco Use  Smoking Status Former Smoker  . Packs/day: 1.50  . Years: 20.00  . Pack years: 30.00  . Types: Cigarettes  . Quit date: 05/02/2017  . Years since quitting: 2.0  Smokeless Tobacco Former Systems developer  Tobacco Comment   Starts and stops related to anxiety rather than drinking alcohol     Counseling given: Yes Comment: Starts and stops related to anxiety rather than drinking alcohol   Clinical Intake:  Pre-visit preparation completed: Yes  Pain : No/denies pain      Nutritional Status: BMI > 30  Obese Nutritional Risks: None Diabetes: No  How often do you need to have someone help you when you read instructions, pamphlets, or other written materials from your doctor or pharmacy?: 1 - Never  Interpreter Needed?: No  Information entered by :: Shenita Trego,LPN  Past Medical History:  Diagnosis Date  . Depression   . GERD (gastroesophageal reflux disease)   . Hyperlipidemia   . Hypertension   . Migraine   . Sleep apnea    Past Surgical History:  Procedure Laterality Date  . oral surgery    . TONSILLECTOMY     Family History  Problem Relation Age of Onset  . Tuberculosis Mother   . Asthma Mother   . Emphysema Father   . Heart attack Father    Social History   Socioeconomic History  . Marital status: Divorced    Spouse name: Not on file  . Number of children: Not on file  . Years of education: Not on file  . Highest education level: Bachelor's degree (e.g., BA, AB, BS)  Occupational History  . Not on file  Social Needs  . Financial resource strain: Not very hard  . Food insecurity    Worry: Never true    Inability: Never true  . Transportation needs    Medical: No    Non-medical: No  Tobacco Use  . Smoking status: Former Smoker    Packs/day: 1.50    Years: 20.00  Pack years: 30.00    Types: Cigarettes    Quit date: 05/02/2017    Years since quitting: 2.0  . Smokeless tobacco: Former Systems developer  . Tobacco comment: Starts and stops related to anxiety rather than drinking alcohol  Substance and Sexual Activity  . 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 Drug use: No  . Sexual activity: Not Currently  Lifestyle  . Physical activity    Days per week: 3 days    Minutes per session: 30 min  . Stress: Not at all  Relationships  . Social connections    Talks on phone: More than three times a week    Gets together: More than three times a week    Attends  religious service: Never    Active member of club or organization: No    Attends meetings of clubs or organizations: Never    Relationship status: Divorced  Other Topics Concern  . Not on file  Social History Narrative  . Not on file    Outpatient Encounter Medications as of 05/08/2019  Medication Sig  . Ascorbic Acid (VITAMIN C) 1000 MG tablet Take 1,000 mg by mouth daily.  Marland Kitchen atorvastatin (LIPITOR) 20 MG tablet Take 1 tablet (20 mg total) by mouth daily at 6 PM.  . b complex vitamins tablet Take 1 tablet by mouth daily.  . baclofen (LIORESAL) 10 MG tablet Take 0.5 tablets (5 mg total) by mouth 2 (two) times daily as needed for muscle spasms (low back pain).  Marland Kitchen buPROPion (WELLBUTRIN XL) 150 MG 24 hr tablet Take 150 mg by mouth 3 (three) times daily.   . Calcium Carbonate (CALCIUM 600 PO) Take by mouth.  . cholecalciferol (VITAMIN D3) 25 MCG (1000 UT) tablet Take 4,000 Units by mouth daily.   . cyanocobalamin 1000 MCG tablet Take 1,000 mcg by mouth daily.  . ferrous sulfate 325 (65 FE) MG tablet Take 325 mg by mouth daily with breakfast.  . Flaxseed, Linseed, (FLAX SEEDS PO) Take 1,000 mg by mouth.   . Iron Combinations (CHROMAGEN PO) Take by mouth.  . levothyroxine (SYNTHROID) 75 MCG tablet Take 1 tablet (75 mcg total) by mouth daily before breakfast.  . lisinopril-hydrochlorothiazide (PRINZIDE,ZESTORETIC) 10-12.5 MG tablet Take 1 tablet by mouth daily.  . magnesium 30 MG tablet Take 400 mg by mouth daily.   . mirtazapine (REMERON) 30 MG tablet Take 30 mg by mouth at bedtime.   . Multiple Vitamin (MULTIVITAMIN WITH MINERALS) TABS tablet Take 1 tablet by mouth daily.  . Omega-3 Fatty Acids (FISH OIL) 1200 MG CAPS Take by mouth.  . Potassium 95 MG TABS Take by mouth.  . Saw Palmetto 450 MG CAPS Take 450 mg by mouth daily.  Marland Kitchen terazosin (HYTRIN) 5 MG capsule Take 1 capsule (5 mg total) by mouth daily.  . traZODone (DESYREL) 150 MG tablet Take 150 mg by mouth at bedtime.   . TURMERIC PO  Take 500 mg by mouth 2 (two) times daily.   . Blood Pressure Monitoring (SM WRIST CUFF BP MONITOR) MISC 1 Units by Does not apply route 2 (two) times a week. (Patient not taking: Reported on 05/08/2019)   No facility-administered encounter medications on file as of 05/08/2019.     Activities of Daily Living In your present state of health, do you have any difficulty performing the following activities: 05/08/2019  Hearing? N  Vision? N  Comment no glasses, goes  to  eye center  Difficulty concentrating or making decisions? N  Walking or climbing stairs? Y  Comment knee problems  Dressing or bathing? N  Doing errands, shopping? Y  Comment walks typically, uses [employer] for groceries and has a friend as needed  Conservation officer, nature and eating ? N  Using the Toilet? N  In the past six months, have you accidently leaked urine? N  Do you have problems with loss of bowel control? N  Managing your Medications? N  Managing your Finances? N  Housekeeping or managing your Housekeeping? N  Some recent data might be hidden    Patient Care Team: Mikey College, NP as PCP - General (Nurse Practitioner) Myer Haff, MD as Referring Physician (Psychiatry) Corrie Dandy, LCSW as Social Worker (Psychiatry)   Assessment:   This is a routine wellness examination for Gwyndolyn Saxon.  Exercise Activities and Dietary recommendations Current Exercise Habits: Home exercise routine, Type of exercise: walking, Time (Minutes): 30, Frequency (Times/Week): 3, Weekly Exercise (Minutes/Week): 90, Intensity: Mild, Exercise limited by: None identified  Goals    . Exercise 3x per week (30 min per time)     Patient would like to lose 30 pounds over the next year. He will increase exercise (walking).    . Increase water intake     Recommend drinking at least 6-8 glasses of water a day     . Reduce calorie intake to 2000 calories per day     Reduce calories from sugar sweetened beverages and concentrated sweets.      . Reduced depressive symptoms. (pt-stated)     Pt would like to work with his psychiatrist to reduce depressive symptoms.     . Weight < 200 lb (90.719 kg)     Wants to loss weight and having less depression       Fall Risk: Fall Risk  05/08/2019 01/30/2019 12/15/2018 05/02/2018 05/02/2018  Falls in the past year? 0 0 0 Yes Yes  Number falls in past yr: - - - 1 1  Injury with Fall? - - - Yes Yes  Follow up - Falls evaluation completed Falls evaluation completed Falls evaluation completed -    FALL RISK PREVENTION PERTAINING TO THE HOME:  Any stairs in or around the home? No  If so, are there any without handrails? n/a  Home free of loose throw rugs in walkways, pet beds, electrical cords, etc? Yes  Adequate lighting in your home to reduce risk of falls? Yes   ASSISTIVE DEVICES UTILIZED TO PREVENT FALLS:  Life alert? No  Use of a cane, walker or w/c? No  Grab bars in the bathroom? No  Shower chair or bench in shower? No  Elevated toilet seat or a handicapped toilet? No   TIMED UP AND GO:  Unable to perform   Depression Screen PHQ 2/9 Scores 05/08/2019 01/30/2019 12/15/2018 08/08/2018  PHQ - 2 Score 0 - 0 0  PHQ- 9 Score - - - 2  Exception Documentation - Patient refusal - -    Cognitive Function MMSE - Mini Mental State Exam 05/13/2016 04/14/2015  Orientation to time 5 5  Orientation to Place 5 5  Registration 3 3  Attention/ Calculation 5 5  Recall 3 3  Language- name 2 objects 2 2  Language- repeat 1 1  Language- follow 3 step command 3 3  Language- read & follow direction 1 1  Write a sentence 1 1  Copy design 1 1  Total score 30 30  6CIT Screen 05/08/2019 05/02/2018 04/05/2017  What Year? 0 points 0 points 0 points  What month? 0 points 0 points 0 points  What time? 0 points 0 points 0 points  Count back from 20 0 points 0 points 0 points  Months in reverse 0 points 0 points 0 points  Repeat phrase 0 points 2 points 2 points  Total Score 0 2 2     Immunization History  Administered Date(s) Administered  . Influenza, High Dose Seasonal PF 07/18/2015, 06/23/2016, 07/11/2017, 08/08/2018  . Influenza-Unspecified 06/13/2014  . Pneumococcal Conjugate-13 03/13/2014  . Pneumococcal Polysaccharide-23 09/13/2012  . Zoster 09/13/2012    Qualifies for Shingles Vaccine? Yes  Zostavax completed 09/2012. Due for Shingrix. Education has been provided regarding the importance of this vaccine. Pt has been advised to call insurance company to determine out of pocket expense. Advised may also receive vaccine at local pharmacy or Health Dept. Verbalized acceptance and understanding.  Tdap: up to date  Flu Vaccine: Due 05/2019  Pneumococcal Vaccine: up to date   Screening Tests Health Maintenance  Topic Date Due  . INFLUENZA VACCINE  04/14/2019  . TETANUS/TDAP  04/16/2025  . PNA vac Low Risk Adult  Completed   Cancer Screenings:  Colorectal Screening: no longer required  Lung Cancer Screening: (Low Dose CT Chest recommended if Age 45-80 years, 30 pack-year currently smoking OR have quit w/in 15years.) does qualify.   Lung Cancer Screening Referral: An Epic message has been sent to Burgess Estelle, RN (Oncology Nurse Navigator) regarding the possible need for this exam. Raquel Sarna will review the patient's chart to determine if the patient truly qualifies for the exam. If the patient qualifies, Raquel Sarna will order the Low Dose CT of the chest to facilitate the scheduling of this exam.   Additional Screening:  Hepatitis C Screening: does not qualify  Vision Screening: Recommended annual ophthalmology exams for early detection of glaucoma and other disorders of the eye. Is the patient up to date with their annual eye exam?  Yes  Who is the provider or what is the name of the office in which the pt attends annual eye exams? [facility]    Dental Screening: Recommended annual dental exams for proper oral hygiene  Community Resource Referral:  CRR  required this visit?  No        Plan:  I have personally reviewed and addressed the Medicare Annual Wellness questionnaire and have noted the following in the patient's chart:  A. Medical and social history B. Use of alcohol, tobacco or illicit drugs  C. Current medications and supplements D. Functional ability and status E.  Nutritional status F.  Physical activity G. Advance directives H. List of other physicians I.  Hospitalizations, surgeries, and ER visits in previous 12 months J.  [place] such as hearing and vision if needed, cognitive and depression L. Referrals and appointments   In addition, I have reviewed and discussed with patient certain preventive protocols, quality metrics, and best practice recommendations. A written personalized care plan for preventive services as well as general preventive health recommendations were provided to patient.   Signed,   Bevelyn Ngo, LPN  [id number] Nurse Health Advisor   Nurse Notes: patient mentioned some anxiety, states it is stable since his last visit- he manages it at home with breathing techniques. Informed patient to call if his anxiety worsens before cpe in November.

## 2019-05-15 ENCOUNTER — Other Ambulatory Visit: Payer: Self-pay | Admitting: Family Medicine

## 2019-05-15 ENCOUNTER — Other Ambulatory Visit: Payer: Self-pay | Admitting: Nurse Practitioner

## 2019-05-15 DIAGNOSIS — I10 Essential (primary) hypertension: Secondary | ICD-10-CM

## 2019-05-15 DIAGNOSIS — N138 Other obstructive and reflux uropathy: Secondary | ICD-10-CM

## 2019-05-15 DIAGNOSIS — E034 Atrophy of thyroid (acquired): Secondary | ICD-10-CM

## 2019-05-15 DIAGNOSIS — E7800 Pure hypercholesterolemia, unspecified: Secondary | ICD-10-CM

## 2019-06-01 ENCOUNTER — Ambulatory Visit: Payer: Medicare Other | Admitting: Oncology

## 2019-06-01 ENCOUNTER — Ambulatory Visit: Payer: Medicare Other

## 2019-06-26 ENCOUNTER — Inpatient Hospital Stay: Payer: Medicare Other | Admitting: Oncology

## 2019-06-26 ENCOUNTER — Ambulatory Visit: Payer: Medicare Other

## 2019-06-28 ENCOUNTER — Other Ambulatory Visit: Payer: Self-pay | Admitting: Family Medicine

## 2019-06-28 DIAGNOSIS — M545 Low back pain, unspecified: Secondary | ICD-10-CM

## 2019-07-26 ENCOUNTER — Encounter: Payer: Medicare Other | Admitting: Nurse Practitioner

## 2019-07-27 ENCOUNTER — Other Ambulatory Visit: Payer: Self-pay

## 2019-07-27 ENCOUNTER — Encounter: Payer: Self-pay | Admitting: Family Medicine

## 2019-07-27 ENCOUNTER — Ambulatory Visit (INDEPENDENT_AMBULATORY_CARE_PROVIDER_SITE_OTHER): Payer: Medicare Other | Admitting: Family Medicine

## 2019-07-27 VITALS — BP 126/74 | HR 90 | Temp 98.4°F | Ht 68.0 in | Wt 201.8 lb

## 2019-07-27 DIAGNOSIS — E782 Mixed hyperlipidemia: Secondary | ICD-10-CM

## 2019-07-27 DIAGNOSIS — F5104 Psychophysiologic insomnia: Secondary | ICD-10-CM | POA: Diagnosis not present

## 2019-07-27 DIAGNOSIS — N138 Other obstructive and reflux uropathy: Secondary | ICD-10-CM

## 2019-07-27 DIAGNOSIS — M545 Low back pain, unspecified: Secondary | ICD-10-CM

## 2019-07-27 DIAGNOSIS — I10 Essential (primary) hypertension: Secondary | ICD-10-CM

## 2019-07-27 DIAGNOSIS — Z23 Encounter for immunization: Secondary | ICD-10-CM

## 2019-07-27 DIAGNOSIS — N401 Enlarged prostate with lower urinary tract symptoms: Secondary | ICD-10-CM

## 2019-07-27 DIAGNOSIS — F3341 Major depressive disorder, recurrent, in partial remission: Secondary | ICD-10-CM | POA: Diagnosis not present

## 2019-07-27 DIAGNOSIS — R7303 Prediabetes: Secondary | ICD-10-CM

## 2019-07-27 DIAGNOSIS — E669 Obesity, unspecified: Secondary | ICD-10-CM

## 2019-07-27 DIAGNOSIS — E039 Hypothyroidism, unspecified: Secondary | ICD-10-CM

## 2019-07-27 DIAGNOSIS — Z0000 Encounter for general adult medical examination without abnormal findings: Secondary | ICD-10-CM | POA: Diagnosis not present

## 2019-07-27 DIAGNOSIS — H6122 Impacted cerumen, left ear: Secondary | ICD-10-CM

## 2019-07-27 DIAGNOSIS — F4322 Adjustment disorder with anxiety: Secondary | ICD-10-CM

## 2019-07-27 DIAGNOSIS — M159 Polyosteoarthritis, unspecified: Secondary | ICD-10-CM

## 2019-07-27 MED ORDER — BUSPIRONE HCL 5 MG PO TABS: 5.0000 mg | ORAL_TABLET | Freq: Every day | ORAL | 0 refills | Status: DC

## 2019-07-27 MED ORDER — BACLOFEN 10 MG PO TABS: ORAL_TABLET | ORAL | 1 refills | Status: DC

## 2019-07-27 NOTE — Assessment & Plan Note (Signed)
Re order baclofen PRN use

## 2019-07-27 NOTE — Assessment & Plan Note (Signed)
Mostly controlled depression now in remission Followed by Dr Kasandra Knudsen ([place]) On med management currently - Associated adjustment anxiety with COVID now and leaving house, will treat this with buspar, see A&P Return to Psychiatry in 08/2019

## 2019-07-27 NOTE — Assessment & Plan Note (Signed)
Clinically stable hypothyroidism Last lab 07/2018, normal TSH range Continue current dose Levothyroxine 11mcg daily  Check TSH Free T4 today, f/u results adjust med as need

## 2019-07-27 NOTE — Progress Notes (Signed)
Subjective:    Patient ID: Joe Hardy, male    DOB: 07/30/1941, 78 y.o.   MRN: [id number]  Joe Hardy is a 78 y.o. male presenting on 07/27/2019 for Annual Exam (anxiety, chronic Right knee, back pain. Pt also concern abot feeling off balance. )    HPI   Here for Annual Physical  Pre-Diabetes Reports no concerns. Last lab A1c 5.6 (07/2018) CBGs: Not checking CBG Meds: None Currently on ACEi Lifestyle: - Diet (improving)  Denies hypoglycemia, polyuria, visual changes, numbness or tingling.  CHRONIC HTN / BPH Reports no new concerns, thinks his BP has improved. Not checking regularly Current Meds - Lisinopril-HCTZ 10-12.5mg  daily, Terazosin 5mg  daily (for LUTS) Reports good compliance, took meds today. Tolerating well, w/o complaints. Denies CP, dyspnea, HA, edema, dizziness / lightheadedness  Hypothyroidism Reviewed last TSH 1.9 to 2.3 in past >6-9 months. Did not have labs prior to this appointment. - He has had long history of hypothyroidism - He takes Levothyroxine 57mcg daily, with good results No new concerns Feels improved energy and less fatigue  Additional history:  Chronic Depression, recurrent in partial remission Insomnia Anxiety, adjustment [facility] Dr Kasandra Knudsen He has not discussed his concern with anxiety with them. Now he has had worse anxiety with going out of the house now worse with COVID changes. He has apt upcoming in December 2020 with them - he describes "noise torture" he said refrigerator broke and there has been a loud constant fan buzzing for past 2 weeks. He says he had a nervous breakdown due to the noise torture. Once the refrigerator was fixed he has felt much better. - He takes Buprion XL 450mg  daily, causes insomnia for him. - He takes Trazodone 150mg  nightly, Mirtazapine  30mg  daily for sleep - He now needs something better for anxiety as needed especially before going out and leaving house.  LEFT ear  wax and reduced hearing - reports difficulty with fullness in L ear, request flushing, hearing is declined.   Health Maintenance: Due for Flu Shot, will receive today    Depression screen [facility] 2/9 07/27/2019 05/08/2019 12/15/2018  Decreased Interest 0 0 0  Down, Depressed, Hopeless 0 0 0  PHQ - 2 Score 0 0 0  Altered sleeping 2 - -  Tired, decreased energy 0 - -  Change in appetite 0 - -  Feeling bad or failure about yourself  0 - -  Trouble concentrating 0 - -  Moving slowly or fidgety/restless 0 - -  Suicidal thoughts 0 - -  PHQ-9 Score 2 - -  Difficult doing work/chores Not difficult at all - -   GAD 7 : Generalized Anxiety Score 07/27/2019  Nervous, Anxious, on Edge 1  Control/stop worrying 1  Worry too much - different things 0  Trouble relaxing 0  Restless 0  Easily annoyed or irritable 0  Afraid - awful might happen 0  Total GAD 7 Score 2  Anxiety Difficulty Not difficult at all     Past Medical History:  Diagnosis Date  . Depression   . GERD (gastroesophageal reflux disease)   . Hyperlipidemia   . Hypertension   . Migraine   . Sleep apnea    Past Surgical History:  Procedure Laterality Date  . oral surgery    . TONSILLECTOMY     Social History   Socioeconomic History  . Marital status: Divorced    Spouse name: Not on file  . Number of children: Not on file  .  Years of education: Not on file  . Highest education level: Bachelor's degree (e.g., BA, AB, BS)  Occupational History  . Not on file  Social Needs  . Financial resource strain: Not very hard  . Food insecurity    Worry: Never true    Inability: Never true  . Transportation needs    Medical: No    Non-medical: No  Tobacco Use  . Smoking status: Former Smoker    Packs/day: 1.50    Years: 20.00    Pack years: 30.00    Types: Cigarettes    Quit date: 05/02/2017    Years since quitting: 2.2  . Smokeless tobacco: Former Systems developer  . Tobacco comment: Starts and stops related to anxiety rather  than drinking alcohol  Substance and Sexual Activity  . 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 Drug use: No  . Sexual activity: Not Currently  Lifestyle  . Physical activity    Days per week: 3 days    Minutes per session: 30 min  . Stress: Not at all  Relationships  . Social connections    Talks on phone: More than three times a week    Gets together: More than three times a week    Attends religious service: Never    Active member of club or organization: No    Attends meetings of clubs or organizations: Never    Relationship status: Divorced  . Intimate partner violence    Fear of current or ex partner: No    Emotionally abused: No    Physically abused: No    Forced sexual activity: No  Other Topics Concern  . Not on file  Social History Narrative  . Not on file   Family History  Problem Relation Age of Onset  . Tuberculosis Mother   . Asthma Mother   . Emphysema Father   . Heart attack Father    Current Outpatient Medications on File Prior to Visit  Medication Sig  . Ascorbic Acid (VITAMIN C) 1000 MG tablet Take 1,000 mg by mouth daily.  Marland Kitchen atorvastatin (LIPITOR) 20 MG tablet TAKE 1 TABLET BY MOUTH  DAILY AT 6PM  . b complex vitamins tablet Take 1 tablet by mouth daily.  . Blood Pressure Monitoring (SM WRIST CUFF BP MONITOR) MISC 1 Units by Does not apply route 2 (two) times a week.  Marland Kitchen buPROPion (WELLBUTRIN XL) 150 MG 24 hr tablet Take 450 mg by mouth daily.  . Calcium Carbonate (CALCIUM 600 PO) Take by mouth.  . cholecalciferol (VITAMIN D3) 25 MCG (1000 UT) tablet Take 4,000 Units by mouth daily.   . cyanocobalamin 1000 MCG tablet Take 1,000 mcg by mouth daily.  . ferrous sulfate 325 (65 FE) MG tablet Take 325 mg by mouth daily with breakfast.  . Flaxseed, Linseed, (FLAX SEEDS PO) Take 1,000 mg by mouth.   Marland Kitchen GARLIC PO Take by mouth.  . levothyroxine (SYNTHROID) 75 MCG tablet TAKE 1 TABLET BY MOUTH   DAILY BEFORE BREAKFAST  . lisinopril-hydrochlorothiazide (ZESTORETIC) 10-12.5 MG tablet TAKE 1 TABLET BY MOUTH  DAILY  . magnesium 30 MG tablet Take 400 mg by mouth daily.   . mirtazapine (REMERON) 30 MG tablet Take 30 mg by mouth at bedtime.   . Multiple Vitamin (MULTIVITAMIN WITH MINERALS) TABS tablet Take 1 tablet by mouth daily.  . Omega-3 Fatty Acids (SALMON OIL) 200 MG CAPS Take by mouth.  Marland Kitchen  Potassium 95 MG TABS Take by mouth.  . Saw Palmetto 450 MG CAPS Take 450 mg by mouth daily.  Marland Kitchen terazosin (HYTRIN) 5 MG capsule TAKE 1 CAPSULE BY MOUTH  DAILY  . traZODone (DESYREL) 150 MG tablet Take 150 mg by mouth at bedtime.   . TURMERIC PO Take 500 mg by mouth 2 (two) times daily.   . Iron Combinations (CHROMAGEN PO) Take by mouth.  . Omega-3 Fatty Acids (FISH OIL) 1200 MG CAPS Take by mouth.   No current facility-administered medications on file prior to visit.     Review of Systems  Constitutional: Negative for activity change, appetite change, chills, diaphoresis, fatigue and fever.  HENT: Positive for hearing loss. Negative for congestion.   Eyes: Negative for visual disturbance.  Respiratory: Negative for apnea, cough, chest tightness, shortness of breath and wheezing.   Cardiovascular: Negative for chest pain, palpitations and leg swelling.  Gastrointestinal: Negative for abdominal pain, constipation, diarrhea, nausea and vomiting.  Endocrine: Negative for cold intolerance.  Genitourinary: Negative for dysuria, frequency and hematuria.  Musculoskeletal: Negative for arthralgias, back pain and neck pain.  Skin: Negative for rash.  Allergic/Immunologic: Negative for environmental allergies.  Neurological: Negative for dizziness, weakness, light-headedness, numbness and headaches.  Hematological: Negative for adenopathy.  Psychiatric/Behavioral: Positive for sleep disturbance. Negative for behavioral problems and dysphoric mood. The patient is nervous/anxious.    Per HPI unless  specifically indicated above      Objective:    BP 126/74 (BP Location: Left Arm, Patient Position: Sitting, Cuff Size: Normal)   Pulse 90   Temp 98.4 F (36.9 C) (Oral)   Ht 5\' 8"  (1.727 m)   Wt 201 lb 12.8 oz (91.5 kg)   BMI 30.68 kg/m   Wt Readings from Last 3 Encounters:  07/27/19 201 lb 12.8 oz (91.5 kg)  05/08/19 207 lb (93.9 kg)  08/08/18 219 lb (99.3 kg)    Physical Exam Vitals signs and nursing note reviewed.  Constitutional:      General: He is not in acute distress.    Appearance: He is well-developed. He is not diaphoretic.     Comments: Well-appearing, comfortable, cooperative  HENT:     Head: Normocephalic and atraumatic.     Comments: Reduced hearing    Right Ear: Tympanic membrane, ear canal and external ear normal.     Ears:     Comments: Left TM obscured by impacted dry cerumen Eyes:     General:        Right eye: No discharge.        Left eye: No discharge.     Conjunctiva/sclera: Conjunctivae normal.     Pupils: Pupils are equal, round, and reactive to light.  Neck:     Musculoskeletal: Normal range of motion and neck supple.     Thyroid: No thyromegaly.  Cardiovascular:     Rate and Rhythm: Normal rate and regular rhythm.     Heart sounds: Normal heart sounds. No murmur.  Pulmonary:     Effort: Pulmonary effort is normal. No respiratory distress.     Breath sounds: Normal breath sounds. No wheezing or rales.  Abdominal:     General: Bowel sounds are normal. There is no distension.     Palpations: Abdomen is soft. There is no mass.     Tenderness: There is no abdominal tenderness.  Musculoskeletal: Normal range of motion.        General: No tenderness.     Comments: Upper / Lower Extremities: -  Normal muscle tone, strength bilateral upper extremities 5/5, lower extremities 5/5  Lymphadenopathy:     Cervical: No cervical adenopathy.  Skin:    General: Skin is warm and dry.     Findings: No erythema or rash.  Neurological:     Mental  Status: He is alert and oriented to person, place, and time.     Comments: Distal sensation intact to light touch all extremities  Psychiatric:        Behavior: Behavior normal.     Comments: Well groomed, good eye contact, normal speech and thoughts      ________________________________________________________ PROCEDURE NOTE Date: 07/27/19 Left Ear Lavage / Cerumen Removal Discussed benefits and risks (including pain / discomforts, dizziness, minor abrasion of ear canal). Verbal consent given by patient. Medication:  carbamide peroxide ear drops, Ear Lavage Solution (warm water + hydrogen peroxide) Performed by Donnie Mesa CMA, Dr Parks Ranger Several drops of carbamide peroxide placed in LEFT ear, allowed to sit for few minutes. Ear lavage solution flushed into LEFT ear in attempt to dislodge and remove ear wax. Results were successful  Repeat Ear Exam: - Completely removed cerumen now, with clear ear canals and visible TMs clear and normal appearance. Improved hearing.   Results for orders placed or performed in visit on 08/08/18  Lipid panel  Result Value Ref Range   Cholesterol 133 <200 mg/dL   HDL 31 (L) >40 mg/dL   Triglycerides 173 (H) <150 mg/dL   LDL Cholesterol (Calc) 75 mg/dL (calc)   Total CHOL/HDL Ratio 4.3 <5.0 (calc)   Non-HDL Cholesterol (Calc) 102 <130 mg/dL (calc)  COMPLETE METABOLIC PANEL WITH GFR  Result Value Ref Range   Glucose, Bld 90 65 - 99 mg/dL   BUN 17 7 - 25 mg/dL   Creat 0.91 0.70 - 1.18 mg/dL   GFR, Est Non African American 81 > OR = 60 mL/min/1.40m2   GFR, Est African American 94 > OR = 60 mL/min/1.83m2   BUN/Creatinine Ratio NOT APPLICABLE 6 - 22 (calc)   Sodium 139 135 - 146 mmol/L   Potassium 4.0 3.5 - 5.3 mmol/L   Chloride 104 98 - 110 mmol/L   CO2 26 20 - 32 mmol/L   Calcium 9.5 8.6 - 10.3 mg/dL   Total Protein 6.6 6.1 - 8.1 g/dL   Albumin 4.4 3.6 - 5.1 g/dL   Globulin 2.2 1.9 - 3.7 g/dL (calc)   AG Ratio 2.0 1.0 - 2.5 (calc)    Total Bilirubin 0.6 0.2 - 1.2 mg/dL   Alkaline phosphatase (APISO) 48 40 - 115 U/L   AST 21 10 - 35 U/L   ALT 21 9 - 46 U/L  TSH  Result Value Ref Range   TSH 2.33 0.40 - 4.50 mIU/L  POCT glycosylated hemoglobin (Hb A1C)  Result Value Ref Range   Hemoglobin A1C 5.6 4.0 - 5.6 %   HbA1c POC (<> result, manual entry)     HbA1c, POC (prediabetic range)     HbA1c, POC (controlled diabetic range)        Assessment & Plan:   Problem List Items Addressed This Visit    Pre-diabetes    Previously controlled Pre-DM A1c last 5.6 in 07/2018 Continue maintain healthy lifestyle, encourage low carb, low sugar, regular exercise improvement  Check A1c today F/u results      Relevant Orders   [facility] - A1c LAB Hemoglobin A1C physical   Mixed hyperlipidemia    Previously controlled Check Lipids today  Plan: 1. Continue  current meds - Atorvastatin 20mg  daily, Omega 3 2. Encourage improved lifestyle - low carb/cholesterol, reduce portion size, continue improving regular exercise      Relevant Orders   [place] - Lipid panel physical   Insomnia    Secondary to depression / anxiety Return to Dr Kasandra Knudsen Psychiatry in 08/2019 For now continue current regimen with Trazodone, Mirtazapine - add buspar for anxiety episodic only      Relevant Medications   busPIRone (BUSPAR) 5 MG tablet   Hypothyroidism    Clinically stable hypothyroidism Last lab 07/2018, normal TSH range Continue current dose Levothyroxine 20mcg daily  Check TSH Free T4 today, f/u results adjust med as need      Relevant Orders   [place] - TSH   [facility] - T4, free   Essential hypertension    Controlled HTN - Home BP readings none  No known complications     Plan:  1. Continue current BP regimen - Lisinopril-HCTZ 10-12.5mg  daily 2. Encourage improved lifestyle - low sodium diet, regular exercise 3. Check BP outside office if able at pharmacy       Relevant Orders   [place] - CBC with Differential/Platelet physical   [place] - CMET  w/ GFR CMP Complete Metabolic Panel physical   Depression, major, recurrent, in partial remission ([place])    Mostly controlled depression now in remission Followed by Dr Kasandra Knudsen ([place]) On med management currently - Associated adjustment anxiety with COVID now and leaving house, will treat this with buspar, see A&P Return to Psychiatry in 08/2019      Relevant Medications   busPIRone (BUSPAR) 5 MG tablet   Other Relevant Orders   [facility] - CBC with Differential/Platelet physical   [facility] - CMET w/ GFR CMP Complete Metabolic Panel physical   Benign prostatic hyperplasia with urinary obstruction    Stable chronic BPH On alpha blocker terazosin, with improvement  Check PSA      Relevant Orders   [place] - PSA physical   Arthritis, degenerative    Re order baclofen PRN use      Relevant Medications   baclofen (LIORESAL) 10 MG tablet    Other Visit Diagnoses    Annual physical exam    -  Primary   Relevant Orders   [facility] - A1c LAB Hemoglobin A1C physical   [place] - CBC with Differential/Platelet physical   [place] - CMET w/ GFR CMP Complete Metabolic Panel physical   [place] - Lipid panel physical   [place] - PSA physical   Needs flu shot       Relevant Orders   Flu Vaccine QUAD High Dose(Fluad) (Completed)   Acute right-sided low back pain without sciatica       Pt continues to have intermittent acute back pain w muscle spasm.  Continue baclofen 10 mg one tab po prn daily.  Follow-up 6 mos.   Relevant Medications   baclofen (LIORESAL) 10 MG tablet   Adjustment disorder with anxiety       Relevant Medications   busPIRone (BUSPAR) 5 MG tablet   Hearing loss of left ear due to cerumen impaction       Obesity (BMI 30.0-34.9)          Updated Health Maintenance information Labs ordered today will follow-up Encouraged improvement to lifestyle with diet and exercise - Goal of weight loss   #L Ear reduced hearing / cerumen impaction Ear lavage successful today resolved impacted cerumen,  hearing improved.  Meds ordered this encounter  Medications  .  busPIRone (BUSPAR) 5 MG tablet    Sig: Take 1 tablet (5 mg total) by mouth daily. For anxiety    Dispense:  90 tablet    Refill:  0  . baclofen (LIORESAL) 10 MG tablet    Sig: TAKE 1/2 TABLET BY MOUTH  TWICE DAILY AS NEEDED FOR  MUSCLE SPASMS    Dispense:  90 tablet    Refill:  1     Follow up plan: Return in about 6 months (around 01/24/2020) for 6 month follow-up w/ new provider.  Nobie Putnam, DO [facility] 07/27/2019, 11:11 AM

## 2019-07-27 NOTE — Assessment & Plan Note (Signed)
Controlled HTN - Home BP readings none  No known complications     Plan:  1. Continue current BP regimen - Lisinopril-HCTZ 10-12.5mg daily 2. Encourage improved lifestyle - low sodium diet, regular exercise 3. Check BP outside office if able at pharmacy  

## 2019-07-27 NOTE — Patient Instructions (Addendum)
Thank you for coming to the office today.  Ordered medicine Baclofen  New anxiety medicine - Buspar 5mg  you can use this up to max 3 times a day as needed, if you don't need it can hold off on it.  Labs ordered stay tuned for results.  Ear wax flush on L side   Please schedule a Follow-up Appointment to: Return in about 6 months (around 01/24/2020) for 6 month follow-up w/ new provider.  If you have any other questions or concerns, please feel free to call the office or send a message through [place]. You may also schedule an earlier appointment if necessary.  Additionally, you may be receiving a survey about your experience at our office within a few days to 1 week by e-mail or mail. We value your feedback.  Nobie Putnam, DO [place]

## 2019-07-27 NOTE — Assessment & Plan Note (Signed)
Secondary to depression / anxiety Return to Dr Kasandra Knudsen Psychiatry in 08/2019 For now continue current regimen with Trazodone, Mirtazapine - add buspar for anxiety episodic only

## 2019-07-27 NOTE — Assessment & Plan Note (Signed)
Previously controlled Check Lipids today  Plan: 1. Continue current meds - Atorvastatin 20mg  daily, Omega 3 2. Encourage improved lifestyle - low carb/cholesterol, reduce portion size, continue improving regular exercise

## 2019-07-27 NOTE — Assessment & Plan Note (Signed)
Previously controlled Pre-DM A1c last 5.6 in 07/2018 Continue maintain healthy lifestyle, encourage low carb, low sugar, regular exercise improvement  Check A1c today F/u results

## 2019-07-27 NOTE — Assessment & Plan Note (Signed)
Stable chronic BPH On alpha blocker terazosin, with improvement  Check PSA

## 2019-07-28 LAB — CBC WITH DIFFERENTIAL/PLATELET
Absolute Monocytes: 601 cells/uL (ref 200–950)
Basophils Absolute: 59 cells/uL (ref 0–200)
Basophils Relative: 0.9 %
Eosinophils Absolute: 132 cells/uL (ref 15–500)
Eosinophils Relative: 2 %
HCT: 46 % (ref 38.5–50.0)
Hemoglobin: 14.5 g/dL (ref 13.2–17.1)
Lymphs Abs: 1954 cells/uL (ref 850–3900)
MCH: 23 pg — ABNORMAL LOW (ref 27.0–33.0)
MCHC: 31.5 g/dL — ABNORMAL LOW (ref 32.0–36.0)
MCV: 72.9 fL — ABNORMAL LOW (ref 80.0–100.0)
MPV: 10.2 fL (ref 7.5–12.5)
Monocytes Relative: 9.1 %
Neutro Abs: 3854 cells/uL (ref 1500–7800)
Neutrophils Relative %: 58.4 %
Platelets: 230 10*3/uL (ref 140–400)
RBC: 6.31 10*6/uL — ABNORMAL HIGH (ref 4.20–5.80)
RDW: 17.1 % — ABNORMAL HIGH (ref 11.0–15.0)
Total Lymphocyte: 29.6 %
WBC: 6.6 10*3/uL (ref 3.8–10.8)

## 2019-07-28 LAB — T4, FREE: Free T4: 1.3 ng/dL (ref 0.8–1.8)

## 2019-07-28 LAB — COMPLETE METABOLIC PANEL WITH GFR
AG Ratio: 2.1 (calc) (ref 1.0–2.5)
ALT: 31 U/L (ref 9–46)
AST: 27 U/L (ref 10–35)
Albumin: 4.6 g/dL (ref 3.6–5.1)
Alkaline phosphatase (APISO): 46 U/L (ref 35–144)
BUN: 11 mg/dL (ref 7–25)
CO2: 28 mmol/L (ref 20–32)
Calcium: 9.8 mg/dL (ref 8.6–10.3)
Chloride: 104 mmol/L (ref 98–110)
Creat: 0.89 mg/dL (ref 0.70–1.18)
GFR, Est African American: 95 mL/min/{1.73_m2} (ref >=60)
GFR, Est Non African American: 82 mL/min/{1.73_m2} (ref >=60)
Globulin: 2.2 g/dL (calc) (ref 1.9–3.7)
Glucose, Bld: 101 mg/dL — ABNORMAL HIGH (ref 65–99)
Potassium: 4.6 mmol/L (ref 3.5–5.3)
Sodium: 137 mmol/L (ref 135–146)
Total Bilirubin: 0.3 mg/dL (ref 0.2–1.2)
Total Protein: 6.8 g/dL (ref 6.1–8.1)

## 2019-07-28 LAB — LIPID PANEL
Cholesterol: 100 mg/dL (ref <200)
HDL: 32 mg/dL — ABNORMAL LOW (ref >=40)
LDL Cholesterol (Calc): 54 mg/dL (calc)
Non-HDL Cholesterol (Calc): 68 mg/dL (calc) (ref <130)
Total CHOL/HDL Ratio: 3.1 (calc) (ref <5.0)
Triglycerides: 63 mg/dL (ref <150)

## 2019-07-28 LAB — PSA: PSA: 2.6 ng/mL (ref <=4.0)

## 2019-07-28 LAB — HEMOGLOBIN A1C
Hgb A1c MFr Bld: 5.4 % of total Hgb (ref <5.7)
Mean Plasma Glucose: 108 (calc)
eAG (mmol/L): 6 (calc)

## 2019-07-28 LAB — TSH: TSH: 1.04 mIU/L (ref 0.40–4.50)

## 2019-08-01 ENCOUNTER — Telehealth: Payer: Self-pay | Admitting: *Deleted

## 2019-08-01 NOTE — Telephone Encounter (Signed)
Received referral for low dose lung cancer screening CT scan. Message left at phone number listed in EMR for patient to call me back to facilitate scheduling scan.  

## 2019-09-28 ENCOUNTER — Other Ambulatory Visit: Payer: Self-pay | Admitting: Family Medicine

## 2019-09-28 DIAGNOSIS — F4322 Adjustment disorder with anxiety: Secondary | ICD-10-CM

## 2019-09-28 DIAGNOSIS — F5104 Psychophysiologic insomnia: Secondary | ICD-10-CM

## 2019-10-02 ENCOUNTER — Encounter: Payer: Self-pay | Admitting: *Deleted

## 2019-11-29 ENCOUNTER — Other Ambulatory Visit: Payer: Self-pay | Admitting: Nurse Practitioner

## 2019-11-29 DIAGNOSIS — E034 Atrophy of thyroid (acquired): Secondary | ICD-10-CM

## 2019-11-29 DIAGNOSIS — N138 Other obstructive and reflux uropathy: Secondary | ICD-10-CM

## 2019-12-28 ENCOUNTER — Other Ambulatory Visit: Payer: Self-pay | Admitting: Family Medicine

## 2019-12-28 DIAGNOSIS — M545 Low back pain, unspecified: Secondary | ICD-10-CM

## 2019-12-28 DIAGNOSIS — F4322 Adjustment disorder with anxiety: Secondary | ICD-10-CM

## 2019-12-28 DIAGNOSIS — F5104 Psychophysiologic insomnia: Secondary | ICD-10-CM

## 2019-12-28 NOTE — Telephone Encounter (Signed)
Requested medication (s) are due for refill today - if to continue  Requested medication (s) are on the active medication list -yes  Future visit scheduled -yes  Last refill: 09/28/19  Notes to clinic: request non delegated Rx  Requested Prescriptions  Pending Prescriptions Disp Refills   baclofen (LIORESAL) 10 MG tablet [Pharmacy Med Name: BACLOFEN  10MG   TAB] 90 tablet 1    Sig: [place] AS NEEDED FOR MUSCLE SPASMS      Not Delegated - Analgesics:  Muscle Relaxants Failed - 12/28/2019 10:12 AM      Failed - This refill cannot be delegated      Failed - Valid encounter within last 6 months    Recent Outpatient Visits           5 months ago Annual physical exam   [facility] Olin Hauser, DO   11 months ago Essential hypertension   [place], Devonne Doughty, DO   1 year ago Irritated throat   [facility] Olin Hauser, DO   1 year ago Pre-diabetes   [facility] Merrilyn Puma, Jerrel Ivory, NP   1 year ago Acute right-sided low back pain without sciatica   [facility] Merrilyn Puma, Jerrel Ivory, NP       Future Appointments             In 3 weeks [place], Lupita Raider, [facility], [place]              busPIRone (BUSPAR) 5 MG tablet [Pharmacy Med Name: BUSPIRONE  5MG   TAB] 90 tablet 0    Sig: TAKE 1 TABLET BY MOUTH  DAILY FOR ANXIETY      Psychiatry: Anxiolytics/Hypnotics - Non-controlled Failed - 12/28/2019 10:12 AM      Failed - Valid encounter within last 6 months    Recent Outpatient Visits           5 months ago Annual physical exam   [facility] Olin Hauser, DO   11 months ago Essential hypertension   [place], Devonne Doughty, DO   1 year ago Irritated throat   [facility] Olin Hauser, DO   1 year ago Pre-diabetes   [facility] Merrilyn Puma, Jerrel Ivory, NP   1 year ago Acute right-sided low back pain without sciatica   [facility] Mikey College, NP       Future Appointments             In 3 weeks [place], Lupita Raider, [facility], [facility]                Requested Prescriptions  Pending Prescriptions Disp Refills   baclofen (LIORESAL) 10 MG tablet [Pharmacy Med Name: BACLOFEN  10MG   TAB] 90 tablet 1    Sig: TAKE ONE-HALF TABLET BY  MOUTH TWICE DAILY AS NEEDED FOR MUSCLE SPASMS      Not Delegated - Analgesics:  Muscle Relaxants Failed - 12/28/2019 10:12 AM      Failed - This refill cannot be delegated      Failed - Valid encounter within last 6 months    Recent Outpatient Visits           5 months ago Annual physical exam   [place], Devonne Doughty, DO  11 months ago Essential hypertension   [place], Devonne Doughty, DO   1 year ago Irritated throat   [facility] Olin Hauser, DO   1 year ago Pre-diabetes   [facility] Merrilyn Puma, Jerrel Ivory, NP   1 year ago Acute right-sided low back pain without sciatica   [facility] Mikey College, NP       Future Appointments             In 3 weeks [place], [facility], [place]              busPIRone (BUSPAR) 5 MG tablet [Pharmacy Med Name: BUSPIRONE  5MG   TAB] 90 tablet 0    Sig: TAKE 1 TABLET BY MOUTH  DAILY FOR ANXIETY      Psychiatry: Anxiolytics/Hypnotics - Non-controlled Failed - 12/28/2019 10:12 AM      Failed - Valid encounter within last 6 months    Recent Outpatient Visits           5 months ago Annual physical exam   [place], DO   11 months ago Essential hypertension   [facility] Olin Hauser, DO   1 year ago Irritated throat   [place], DO   1 year ago Pre-diabetes   [facility] Mikey College, NP   1 year ago Acute right-sided low back pain without sciatica   [facility] Mikey College, NP       Future Appointments             In 3 weeks [place], Lupita Raider, [facility], [facility]

## 2020-01-24 ENCOUNTER — Ambulatory Visit: Payer: Medicare Other | Admitting: Family Medicine

## 2020-01-30 ENCOUNTER — Ambulatory Visit: Payer: Medicare Other | Admitting: Family Medicine

## 2020-02-14 ENCOUNTER — Encounter: Payer: Self-pay | Admitting: Family Medicine

## 2020-02-14 ENCOUNTER — Other Ambulatory Visit: Payer: Self-pay

## 2020-02-14 ENCOUNTER — Ambulatory Visit (INDEPENDENT_AMBULATORY_CARE_PROVIDER_SITE_OTHER): Payer: Medicare Other | Admitting: Family Medicine

## 2020-02-14 VITALS — BP 132/88 | HR 86 | Temp 97.7°F | Ht 68.0 in | Wt 213.0 lb

## 2020-02-14 DIAGNOSIS — R7303 Prediabetes: Secondary | ICD-10-CM | POA: Diagnosis not present

## 2020-02-14 DIAGNOSIS — R8290 Unspecified abnormal findings in urine: Secondary | ICD-10-CM

## 2020-02-14 DIAGNOSIS — I10 Essential (primary) hypertension: Secondary | ICD-10-CM

## 2020-02-14 LAB — POCT URINALYSIS DIPSTICK
Bilirubin, UA: NEGATIVE
Blood, UA: NEGATIVE
Glucose, UA: NEGATIVE
Ketones, UA: NEGATIVE
Nitrite, UA: NEGATIVE
Protein, UA: NEGATIVE
Spec Grav, UA: <=1.005 — AB (ref 1.010–1.025)
Urobilinogen, UA: 0.2 E.U./dL
pH, UA: 5 (ref 5.0–8.0)

## 2020-02-14 LAB — POCT GLYCOSYLATED HEMOGLOBIN (HGB A1C): Hemoglobin A1C: 5.4 % (ref 4.0–5.6)

## 2020-02-14 MED ORDER — SM WRIST CUFF BP MONITOR MISC: 1.0000 [IU] | 0 refills | Status: AC

## 2020-02-14 NOTE — Assessment & Plan Note (Signed)
BP elevated in clinic today.  Pt is working on lifestyle modifications.  Taking medications tolerating well without side effects. No known complications.  Reports his blood pressure is always elevated when he is at a doctor's appointment but does not have a blood pressure cuff at home to take home readings.  Plan: 1. Continue taking lisinopril-hydrochlorothiazide 10-12.5mg  daily 2. Pick up blood pressure cuff at pharmacy and begin using 3. Encouraged heart healthy diet and increasing exercise to 30 minutes most days of the week, going no more than 2 days in a row without exercise. 4. Check BP 1-2 x per week at home, keep log, and bring to clinic at next appointment. 5. Follow up 6 months, unless BP readings consistently over 130/80, then follow up in office sooner

## 2020-02-14 NOTE — Patient Instructions (Signed)
I have sent in a prescription for a blood pressure cuff to your pharmacy on file  Try to get exercise a minimum of 30 minutes per day at least 5 days per week as well as  adequate water intake all while measuring blood pressure a few times per week.  Keep a blood pressure log and bring back to clinic at your next visit.  If your readings are consistently over 130/80 to contact our office/send me a MyChart message and we will see you sooner.  Can try DASH and Mediterranean diet options, avoiding processed foods, lowering sodium intake, avoiding pork products, and eating a plant based diet for optimal health.  Education and discussion with patient regarding hypertension as well as the effects on the organs and body.  Specifically, we spoke about kidney disease, kidney failure, heart attack, stroke and up to and including death, as likely outcomes if non-compliant with blood pressure regulation.  Discussed how all of these habits are attached to each other and each has the effect on each other.      Mediterranean Diet  Why follow it? Research shows. . Those who follow the Mediterranean diet have a reduced risk of heart disease  . The diet is associated with a reduced incidence of Parkinson's and Alzheimer's diseases . People following the diet may have longer life expectancies and lower rates of chronic diseases  . The Dietary Guidelines for Americans recommends the Mediterranean diet as an eating plan to promote health and prevent disease  What Is the Mediterranean Diet?  . Healthy eating plan based on typical foods and recipes of Mediterranean-style cooking . The diet is primarily a plant based diet; these foods should make up a majority of meals   Starches - Plant based foods should make up a majority of meals - They are an important sources of vitamins, minerals, energy, antioxidants, and fiber - Choose whole grains, foods high in fiber and minimally processed items  - Typical grain sources  include wheat, oats, barley, corn, brown rice, bulgar, farro, millet, polenta, couscous  - Various types of beans include chickpeas, lentils, fava beans, black beans, white beans   Fruits  Veggies - Large quantities of antioxidant rich fruits & veggies; 6 or more servings  - Vegetables can be eaten raw or lightly drizzled with oil and cooked  - Vegetables common to the traditional Mediterranean Diet include: artichokes, arugula, beets, broccoli, brussel sprouts, cabbage, carrots, celery, collard greens, cucumbers, eggplant, kale, leeks, lemons, lettuce, mushrooms, okra, onions, peas, peppers, potatoes, pumpkin, radishes, rutabaga, shallots, spinach, sweet potatoes, turnips, zucchini - Fruits common to the Mediterranean Diet include: apples, apricots, avocados, cherries, clementines, dates, figs, grapefruits, grapes, melons, nectarines, oranges, peaches, pears, pomegranates, strawberries, tangerines  Fats - Replace butter and margarine with healthy oils, such as olive oil, canola oil, and tahini  - Limit nuts to no more than a handful a day  - Nuts include walnuts, almonds, pecans, pistachios, pine nuts  - Limit or avoid candied, honey roasted or heavily salted nuts - Olives are central to the [employer] - can be eaten whole or used in a variety of dishes   Meats Protein - Limiting red meat: no more than a few times a month - When eating red meat: choose lean cuts and keep the portion to the size of deck of cards - Eggs: approx. 0 to 4 times a week  - Fish and lean poultry: at least 2 a week  - Healthy protein sources include,  chicken, [place], lean beef, lamb - Increase intake of seafood such as tuna, salmon, trout, mackerel, shrimp, scallops - Avoid or limit high fat processed meats such as sausage and bacon  Dairy - Include moderate amounts of low fat dairy products  - Focus on healthy dairy such as fat free yogurt, skim milk, low or reduced fat cheese - Limit dairy products higher in  fat such as whole or 2% milk, cheese, ice cream  Alcohol - Moderate amounts of red wine is ok  - No more than 5 oz daily for women (all ages) and men older than age 44  - No more than 10 oz of wine daily for men younger than 5  Other - Limit sweets and other desserts  - Use herbs and spices instead of salt to flavor foods  - Herbs and spices common to the traditional Mediterranean Diet include: basil, bay leaves, chives, cloves, cumin, fennel, garlic, lavender, marjoram, mint, oregano, parsley, pepper, rosemary, sage, savory, sumac, tarragon, thyme   It's not just a diet, it's a lifestyle:  . The Mediterranean diet includes lifestyle factors typical of those in the region  . Foods, drinks and meals are best eaten with others and savored . Daily physical activity is important for overall good health . This could be strenuous exercise like running and aerobics . This could also be more leisurely activities such as walking, housework, yard-work, or taking the stairs . Moderation is the key; a balanced and healthy diet accommodates most foods and drinks . Consider portion sizes and frequency of consumption of certain foods   Meal Ideas & Options:  . Breakfast:  o Whole wheat toast or whole wheat English muffins with peanut butter & hard boiled egg o Steel cut oats topped with apples & cinnamon and skim milk  o Fresh fruit: banana, strawberries, melon, berries, peaches  o Smoothies: strawberries, bananas, greek yogurt, peanut butter o Low fat greek yogurt with blueberries and granola  o Egg white omelet with spinach and mushrooms o Breakfast couscous: whole wheat couscous, apricots, skim milk, cranberries  . Sandwiches:  o Hummus and grilled vegetables (peppers, zucchini, squash) on whole wheat bread   o Grilled chicken on whole wheat pita with lettuce, tomatoes, cucumbers or tzatziki  o Tuna salad on whole wheat bread: tuna salad made with greek yogurt, olives, red peppers, capers, green  onions o Garlic rosemary lamb pita: lamb sauted with garlic, rosemary, salt & pepper; add lettuce, cucumber, greek yogurt to pita - flavor with lemon juice and black pepper  . Seafood:  o Mediterranean grilled salmon, seasoned with garlic, basil, parsley, lemon juice and black pepper o Shrimp, lemon, and spinach whole-grain pasta salad made with low fat greek yogurt  o Seared scallops with lemon orzo  o Seared tuna steaks seasoned salt, pepper, coriander topped with tomato mixture of olives, tomatoes, olive oil, minced garlic, parsley, green onions and cappers  . Meats:  o Herbed greek chicken salad with kalamata olives, cucumber, feta  o Red bell peppers stuffed with spinach, bulgur, lean ground beef (or lentils) & topped with feta   o Kebabs: skewers of chicken, tomatoes, onions, zucchini, squash  o [place] burgers: made with red onions, mint, dill, lemon juice, feta cheese topped with roasted red peppers . Vegetarian o Cucumber salad: cucumbers, artichoke hearts, celery, red onion, feta cheese, tossed in olive oil & lemon juice  o Hummus and whole grain pita points with a greek salad (lettuce, tomato, feta, olives, cucumbers, red  onion) o Lentil soup with celery, carrots made with vegetable broth, garlic, salt and pepper  o Tabouli salad: parsley, bulgur, mint, scallions, cucumbers, tomato, radishes, lemon juice, olive oil, salt and pepper.  We will plan to see you back in 6 months for hypertension, hyperlipidemia and hypothyroid follow up  You will receive a survey after today's visit either digitally by e-mail or paper by [employer]. Your experiences and feedback matter to [place].  Please respond so we know how we are doing as we provide care for you.  Call us with any questions/concerns/needs.  It is my goal to be available to you for your health concerns.  Thanks for choosing me to be a partner in your healthcare needs!  Harlin Rain, FNP-C Family Nurse Practitioner [facility] Phone: [phone]

## 2020-02-14 NOTE — Progress Notes (Signed)
Subjective:    Patient ID: Joe Hardy, male    DOB: 08-01-1941, 79 y.o.   MRN: [id number]  Joe Hardy is a 79 y.o. male presenting on 02/14/2020 for Hypertension   HPI  Hypertension - He is not checking BP at home or outside of clinic.    - Current medications: lisinopril-hydrochlorothiazide 10-12.5mg  daily, tolerating well without side effects - He is not currently symptomatic. - Pt denies headache, lightheadedness, dizziness, changes in vision, chest tightness/pressure, palpitations, leg swelling, sudden loss of speech or loss of consciousness. - He  reports no regular exercise routine. - His diet is low in salt, low in fat, and low in carbohydrates.  Depression screen [facility] 2/9 07/27/2019 05/08/2019 12/15/2018  Decreased Interest 0 0 0  Down, Depressed, Hopeless 0 0 0  PHQ - 2 Score 0 0 0  Altered sleeping 2 - -  Tired, decreased energy 0 - -  Change in appetite 0 - -  Feeling bad or failure about yourself  0 - -  Trouble concentrating 0 - -  Moving slowly or fidgety/restless 0 - -  Suicidal thoughts 0 - -  PHQ-9 Score 2 - -  Difficult doing work/chores Not difficult at all - -    Social History   Tobacco Use  . Smoking status: Former Smoker    Packs/day: 1.50    Years: 20.00    Pack years: 30.00    Types: Cigarettes    Quit date: 05/02/2017    Years since quitting: 2.7  . Smokeless tobacco: Former Systems developer  . Tobacco comment: Starts and stops related to anxiety rather than drinking alcohol  Substance Use Topics  . 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 Drug use: No    Review of Systems  Constitutional: Negative.   HENT: Negative.   Eyes: Negative.   Respiratory: Negative.   Cardiovascular: Negative.   Gastrointestinal: Negative.   Endocrine: Negative.   Genitourinary: Negative.   Musculoskeletal: Negative.   Skin: Negative.   Allergic/Immunologic: Negative.   Neurological: Negative.     Hematological: Negative.   Psychiatric/Behavioral: Negative.    Per HPI unless specifically indicated above     Objective:    BP 132/88 (BP Location: Left Arm, Patient Position: Sitting, Cuff Size: Large)   Pulse 86   Temp 97.7 F (36.5 C) (Temporal)   Ht 5\' 8"  (1.727 m)   Wt 213 lb (96.6 kg)   BMI 32.39 kg/m   Wt Readings from Last 3 Encounters:  02/14/20 213 lb (96.6 kg)  07/27/19 201 lb 12.8 oz (91.5 kg)  05/08/19 207 lb (93.9 kg)    Physical Exam Vitals reviewed.  Constitutional:      General: He is not in acute distress.    Appearance: Normal appearance. He is not ill-appearing or toxic-appearing.  HENT:     Head: Normocephalic.     Nose:     Comments: [name] is in place, covering mouth and nose Eyes:     General:        Right eye: No discharge.        Left eye: No discharge.     Extraocular Movements: Extraocular movements intact.     Conjunctiva/sclera: Conjunctivae normal.     Pupils: Pupils are equal, round, and reactive to light.  Cardiovascular:     Rate and Rhythm: Normal rate and regular rhythm.     Pulses: Normal pulses.  Heart sounds: Normal heart sounds. No murmur. No friction rub. No gallop.   Pulmonary:     Effort: Pulmonary effort is normal. No respiratory distress.     Breath sounds: Normal breath sounds.  Skin:    General: Skin is warm and dry.     Capillary Refill: Capillary refill takes less than 2 seconds.  Neurological:     General: No focal deficit present.     Mental Status: He is alert and oriented to person, place, and time.     Cranial Nerves: No cranial nerve deficit.     Sensory: No sensory deficit.     Motor: No weakness.     Coordination: Coordination normal.     Gait: Gait normal.  Psychiatric:        Attention and Perception: Attention and perception normal.        Mood and Affect: Mood and affect normal.        Speech: Speech normal.        Behavior: Behavior normal. Behavior is cooperative.        Thought  Content: Thought content normal.        Cognition and Memory: Cognition and memory normal.        Judgment: Judgment normal.     Results for orders placed or performed in visit on 02/14/20  POCT glycosylated hemoglobin (Hb A1C)  Result Value Ref Range   Hemoglobin A1C 5.4 4.0 - 5.6 %   HbA1c POC (<> result, manual entry)     HbA1c, POC (prediabetic range)     HbA1c, POC (controlled diabetic range)    POCT Urinalysis Dipstick  Result Value Ref Range   Color, UA Yellow    Clarity, UA clear    Glucose, UA Negative Negative   Bilirubin, UA negative    Ketones, UA negative    Spec Grav, UA <=1.005 (A) 1.010 - 1.025   Blood, UA negaitve    pH, UA 5.0 5.0 - 8.0   Protein, UA Negative Negative   Urobilinogen, UA 0.2 0.2 or 1.0 E.U./dL   Nitrite, UA negative    Leukocytes, UA Large (3+) (A) Negative   Appearance     Odor        Assessment & Plan:   Problem List Items Addressed This Visit      Cardiovascular and Mediastinum   Essential hypertension    BP elevated in clinic today.  Pt is working on lifestyle modifications.  Taking medications tolerating well without side effects. No known complications.  Reports his blood pressure is always elevated when he is at a doctor's appointment but does not have a blood pressure cuff at home to take home readings.  Plan: 1. Continue taking lisinopril-hydrochlorothiazide 10-12.5mg  daily 2. Pick up blood pressure cuff at pharmacy and begin using 3. Encouraged heart healthy diet and increasing exercise to 30 minutes most days of the week, going no more than 2 days in a row without exercise. 4. Check BP 1-2 x per week at home, keep log, and bring to clinic at next appointment. 5. Follow up 6 months, unless BP readings consistently over 130/80, then follow up in office sooner          Other   Pre-diabetes - Primary   Relevant Orders   POCT glycosylated hemoglobin (Hb A1C) (Completed)   POCT Urinalysis Dipstick (Completed)    Other  Visit Diagnoses    Essential (primary) hypertension       Relevant Medications   Blood  Pressure Monitoring (SM WRIST CUFF BP MONITOR) MISC   Abnormal urinalysis       Relevant Orders   Urine Culture      Meds ordered this encounter  Medications  . Blood Pressure Monitoring (SM WRIST CUFF BP MONITOR) MISC    Sig: 1 Units by Does not apply route 2 (two) times a week.    Dispense:  1 each    Refill:  0      Follow up plan: Return in about 6 months (around 08/15/2020) for HTN, HLD, Hypothyroidism follow up.   Harlin Rain, [place] Family Nurse Practitioner [facility] 02/14/2020, 12:52 PM

## 2020-02-15 LAB — URINE CULTURE
MICRO NUMBER:: 10549293
Result:: NO GROWTH
SPECIMEN QUALITY:: ADEQUATE

## 2020-03-22 ENCOUNTER — Other Ambulatory Visit: Payer: Self-pay | Admitting: Family Medicine

## 2020-03-22 DIAGNOSIS — F4322 Adjustment disorder with anxiety: Secondary | ICD-10-CM

## 2020-03-22 DIAGNOSIS — F5104 Psychophysiologic insomnia: Secondary | ICD-10-CM

## 2020-03-22 NOTE — Telephone Encounter (Signed)
Requested Prescriptions  Pending Prescriptions Disp Refills  . busPIRone (BUSPAR) 5 MG tablet [Pharmacy Med Name: BUSPIRONE  5MG   TAB] 90 tablet 0    Sig: TAKE 1 TABLET BY MOUTH  DAILY FOR ANXIETY     Psychiatry: Anxiolytics/Hypnotics - Non-controlled Passed - 03/22/2020  3:01 PM      Passed - Valid encounter within last 6 months    Recent Outpatient Visits          1 month ago Pre-diabetes   [facility], Lupita Raider, FNP   7 months ago Annual physical exam   [facility] Olin Hauser, DO   1 year ago Essential hypertension   [place], DO   1 year ago Irritated throat   [facility] Olin Hauser, DO   1 year ago Pre-diabetes   [facility]ilyn Puma, Jerrel Ivory, NP      Future Appointments            In 4 months [place], Lupita Raider, [facility], [facility]

## 2020-05-23 ENCOUNTER — Telehealth: Payer: Self-pay | Admitting: Nurse Practitioner

## 2020-05-23 DIAGNOSIS — I10 Essential (primary) hypertension: Secondary | ICD-10-CM

## 2020-05-23 DIAGNOSIS — E7800 Pure hypercholesterolemia, unspecified: Secondary | ICD-10-CM

## 2020-05-23 NOTE — Telephone Encounter (Signed)
Requested medication (s) are due for refill today: Yes  Requested medication (s) are on the active medication list: Yes  Last refill:  05/16/19  Future visit scheduled: Yes  Notes to clinic:  Both prescriptions have expired.    Requested Prescriptions  Pending Prescriptions Disp Refills   atorvastatin (LIPITOR) 20 MG tablet [Pharmacy Med Name: Atorvastatin Calcium 20 MG Oral Tablet] 90 tablet 3    Sig: TAKE 1 TABLET BY MOUTH  DAILY AT 6PM      Cardiovascular:  Antilipid - Statins Failed - 05/23/2020  4:44 AM      Failed - HDL in normal range and within 360 days    HDL  Date Value Ref Range Status  07/27/2019 32 (L) > OR = 40 mg/dL Final  04/20/2017 26 (L) >39 mg/dL Final          Passed - Total Cholesterol in normal range and within 360 days    Cholesterol, Total  Date Value Ref Range Status  04/20/2017 113 100 - 199 mg/dL Final   Cholesterol  Date Value Ref Range Status  07/27/2019 100 <200 mg/dL Final          Passed - LDL in normal range and within 360 days    LDL Cholesterol (Calc)  Date Value Ref Range Status  07/27/2019 54 mg/dL (calc) Final    Comment:    Reference range: <100 . Desirable range <100 mg/dL for primary prevention;   <70 mg/dL for patients with CHD or diabetic patients  with > or = 2 CHD risk factors. Marland Kitchen LDL-C is now calculated using the Martin-Hopkins  calculation, which is a validated novel method providing  better accuracy than the Friedewald equation in the  estimation of LDL-C.  Cresenciano Genre et al. Annamaria Helling. 5284;132(44): 2061-2068  (http://education.[employer].com/faq/FAQ164)           Passed - Triglycerides in normal range and within 360 days    Triglycerides  Date Value Ref Range Status  07/27/2019 63 <150 mg/dL Final          Passed - Patient is not pregnant      Passed - Valid encounter within last 12 months    Recent Outpatient Visits           3 months ago Pre-diabetes   [facility], Lupita Raider,  FNP   10 months ago Annual physical exam   [facility] Olin Hauser, DO   1 year ago Essential hypertension   [facility] Olin Hauser, DO   1 year ago Irritated throat   [facility] Olin Hauser, DO   1 year ago [facility] Merrily[facility] Puma, Jerrel Ivory, NP                lisinopril-hydrochlorothiazide (ZESTORETIC) 10-12.5 MG tablet [Pharmacy Med Name: Lisinopril-hydroCHLOROthiazide 10-12.5 MG Oral Tablet] 90 tablet 3    Sig: TAKE 1 TABLET BY MOUTH  DAILY      Cardiovascular:  ACEI + Diuretic Combos Failed - 05/23/2020  4:44 AM      Failed - Na in normal range and within 180 days    Sodium  Date Value Ref Range Status  07/27/2019 137 135 - 146 mmol/L Final  04/20/2017 140 134 - 144 mmol/L Final          Failed - K in normal range and within 180 days    Potassium  Date Value Ref Range Status  07/27/2019 4.6 3.5 - 5.3 mmol/L Final          Failed - Cr in normal range and within 180 days    Creat  Date Value Ref Range Status  07/27/2019 0.89 0.70 - 1.18 mg/dL Final    Comment:    For patients >59 years of age, the reference limit for Creatinine is approximately 13% higher for people identified as African-American. .           Failed - Ca in normal range and within 180 days    Calcium  Date Value Ref Range Status  07/27/2019 9.8 8.6 - 10.3 mg/dL Final          Passed - Patient is not pregnant      Passed - Last BP in normal range    BP Readings from Last 1 Encounters:  02/14/20 132/88          Passed - Valid encounter within last 6 months    Recent Outpatient Visits           3 months ago Pre-diabetes   [facility], Lupita Raider, FNP   10 months ago Annual physical exam   [facility] Olin Hauser, DO   1 year ago Essential hypertension   [place], Devonne Doughty, DO   1  year ago Irritated throat   [facility] Olin Hauser, DO   1 year ago Pre-diabetes   [facility] Merrilyn Puma, Jerrel Ivory, NP

## 2020-07-01 ENCOUNTER — Other Ambulatory Visit: Payer: Self-pay | Admitting: Family Medicine

## 2020-07-01 ENCOUNTER — Other Ambulatory Visit: Payer: Self-pay | Admitting: Nurse Practitioner

## 2020-07-01 DIAGNOSIS — I10 Essential (primary) hypertension: Secondary | ICD-10-CM

## 2020-07-01 DIAGNOSIS — F5104 Psychophysiologic insomnia: Secondary | ICD-10-CM

## 2020-07-01 DIAGNOSIS — E7800 Pure hypercholesterolemia, unspecified: Secondary | ICD-10-CM

## 2020-07-01 DIAGNOSIS — F4322 Adjustment disorder with anxiety: Secondary | ICD-10-CM

## 2020-07-01 DIAGNOSIS — M545 Low back pain, unspecified: Secondary | ICD-10-CM

## 2020-07-01 NOTE — Telephone Encounter (Signed)
Requested medication (s) are due for refill today: Yes  Requested medication (s) are on the active medication list: Yes  Last refill:  12/28/19  Future visit scheduled: Yes  Notes to clinic:  See request.    Requested Prescriptions  Pending Prescriptions Disp Refills   baclofen (LIORESAL) 10 MG tablet [Pharmacy Med Name: BACLOFEN  10MG   TAB] 90 tablet 1    Sig: TAKE ONE-HALF TABLET BY  MOUTH TWICE DAILY AS NEEDED FOR MUSCLE SPASMS      Not Delegated - Analgesics:  Muscle Relaxants Failed - 07/01/2020 10:57 AM      Failed - This refill cannot be delegated      Passed - Valid encounter within last 6 months    Recent Outpatient Visits           4 months ago [facility] [place], Lupita Raider, FNP   11 months ago Annual physical exam   [facility] Olin Hauser, DO   1 year ago Essential hypertension   [facility] Olin Hauser, DO   1 year ago Irritated throat   [facility] [facility] Olin Hauser, DO   1 year ago Pre-diabetes   [facility] Merrilyn Puma, Jerrel Ivory, NP               Signed Prescriptions Disp Refills   busPIRone (BUSPAR) 5 MG tablet 90 tablet 0    Sig: TAKE 1 TABLET BY MOUTH  DAILY FOR ANXIETY      Psychiatry: Anxiolytics/Hypnotics - Non-controlled Passed - 07/01/2020 10:57 AM      Passed - Valid encounter within last 6 months    Recent Outpatient Visits           4 months ago [facility] [place], Lupita Raider, FNP   11 months ago Annual physical exam   [facility] Olin Hauser, DO   1 year ago Essential hypertension   [place], Devonne Doughty, DO   1 year ago Irritated throat   [facility] Olin Hauser, DO   1 year ago Pre-diabetes   [facility] Merrilyn Puma, Jerrel Ivory, NP

## 2020-07-01 NOTE — Telephone Encounter (Signed)
Requested Prescriptions  Pending Prescriptions Disp Refills   lisinopril-hydrochlorothiazide (ZESTORETIC) 10-12.5 MG tablet [Pharmacy Med Name: Lisinopril-hydroCHLOROthiazide 10-12.5 MG Oral Tablet] 90 tablet 0    Sig: TAKE 1 TABLET BY MOUTH  DAILY     Cardiovascular:  ACEI + Diuretic Combos Failed - 07/01/2020 10:57 AM      Failed - Na in normal range and within 180 days    Sodium  Date Value Ref Range Status  07/27/2019 137 135 - 146 mmol/L Final  04/20/2017 140 134 - 144 mmol/L Final         Failed - K in normal range and within 180 days    Potassium  Date Value Ref Range Status  07/27/2019 4.6 3.5 - 5.3 mmol/L Final         Failed - Cr in normal range and within 180 days    Creat  Date Value Ref Range Status  07/27/2019 0.89 0.70 - 1.18 mg/dL Final    Comment:    For patients >54 years of age, the reference limit for Creatinine is approximately 13% higher for people identified as African-American. .          Failed - Ca in normal range and within 180 days    Calcium  Date Value Ref Range Status  07/27/2019 9.8 8.6 - 10.3 mg/dL Final         Passed - Patient is not pregnant      Passed - Last BP in normal range    BP Readings from Last 1 Encounters:  02/14/20 132/88         Passed - Valid encounter within last 6 months    Recent Outpatient Visits          4 months ago Pre-diabetes   [facility], Lupita Raider, FNP   11 months ago Annual physical exam   [facility] Olin Hauser, DO   1 year ago Essential hypertension   [place], Devonne Doughty, DO   1 year ago Irritated throat   [facility] Olin Hauser, DO   1 year ago [facility] Merrilyn Puma, Jerrel Ivory, NP              atorvastatin (LIPITOR) 20 MG tablet [Pharmacy Med Name: Atorvastatin Calcium 20 MG Oral Tablet] 90 tablet 0    Sig: TAKE 1 TABLET BY MOUTH  DAILY AT  6PM     Cardiovascular:  Antilipid - Statins Failed - 07/01/2020 10:57 AM      Failed - HDL in normal range and within 360 days    HDL  Date Value Ref Range Status  07/27/2019 32 (L) > OR = 40 mg/dL Final  04/20/2017 26 (L) >39 mg/dL Final         Passed - Total Cholesterol in normal range and within 360 days    Cholesterol, Total  Date Value Ref Range Status  04/20/2017 113 100 - 199 mg/dL Final   Cholesterol  Date Value Ref Range Status  07/27/2019 100 <200 mg/dL Final         Passed - LDL in normal range and within 360 days    LDL Cholesterol (Calc)  Date Value Ref Range Status  07/27/2019 54 mg/dL (calc) Final    Comment:    Reference range: <100 . Desirable range <100 mg/dL for primary prevention;   <70 mg/dL for patients with CHD or diabetic patients  with >  or = 2 CHD risk factors. Marland Kitchen LDL-C is now calculated using the Martin-Hopkins  calculation, which is a validated novel method providing  better accuracy than the Friedewald equation in the  estimation of LDL-C.  Cresenciano Genre et al. Annamaria Helling. 2536;644(03): 2061-2068  (http://education.[employer].com/faq/FAQ164)          Passed - Triglycerides in normal range and within 360 days    Triglycerides  Date Value Ref Range Status  07/27/2019 63 <150 mg/dL Final         Passed - Patient is not pregnant      Passed - Valid encounter within last 12 months    Recent Outpatient Visits          4 months ago Pre-diabetes   [facility], Lupita Raider, FNP   11 months ago Annual physical exam   [facility] Olin Hauser, DO   1 year ago Essential hypertension   [place], DO   1 year ago Irritated throat   [facility] Olin Hauser, DO   1 year ago Pre-diabetes   [facility] Merrilyn Puma, Jerrel Ivory, NP

## 2020-07-29 ENCOUNTER — Encounter: Payer: Medicare Other | Admitting: Family Medicine

## 2020-08-04 ENCOUNTER — Ambulatory Visit: Payer: Medicare Other

## 2020-08-05 ENCOUNTER — Ambulatory Visit (INDEPENDENT_AMBULATORY_CARE_PROVIDER_SITE_OTHER): Payer: Medicare Other

## 2020-08-05 VITALS — Ht 69.5 in | Wt 205.0 lb

## 2020-08-05 DIAGNOSIS — Z0000 Encounter for general adult medical examination without abnormal findings: Secondary | ICD-10-CM | POA: Diagnosis not present

## 2020-08-05 NOTE — Patient Instructions (Signed)
[name] , Thank you for taking time to come for your Medicare Wellness Visit. I appreciate your ongoing commitment to your health goals. Please review the following plan we discussed and let me know if I can assist you in the future.   Screening recommendations/referrals: Colonoscopy: completed 09/13/2014, due 09/13/2024 Recommended yearly ophthalmology/optometry visit for glaucoma screening and checkup Recommended yearly dental visit for hygiene and checkup  Vaccinations: Influenza vaccine: completed 07/18/2020, due 04/13/2021 Pneumococcal vaccine: completed 03/13/2014 Tdap vaccine: completed 04/17/2015, due 04/16/2025 Shingles vaccine: discussed   Covid-19:  09/20/2019, 10/13/2019, 07/18/2020  Advanced directives: Advance directive discussed with you today. Even though you declined this today please call our office should you change your mind and we can give you the proper paperwork for you to fill out.  Conditions/risks identified: none  Next appointment: Follow up in one year for your annual wellness visit.   Preventive Care 75 Years and Older, Male Preventive care refers to lifestyle choices and visits with your health care provider that can promote health and wellness. What does preventive care include?  A yearly physical exam. This is also called an annual well check.  Dental exams once or twice a year.  Routine eye exams. Ask your health care provider how often you should have your eyes checked.  Personal lifestyle choices, including:  Daily care of your teeth and gums.  Regular physical activity.  Eating a healthy diet.  Avoiding tobacco and drug use.  Limiting alcohol use.  Practicing safe sex.  Taking low doses of aspirin every day.  Taking vitamin and mineral supplements as recommended by your health care provider. What happens during an annual well check? The services and screenings done by your health care provider during your annual well check will depend on your  age, overall health, lifestyle risk factors, and family history of disease. Counseling  Your health care provider may ask you questions about your:  Alcohol use.  Tobacco use.  Drug use.  Emotional well-being.  Home and relationship well-being.  Sexual activity.  Eating habits.  History of falls.  Memory and ability to understand (cognition).  Work and work Statistician. Screening  You may have the following tests or measurements:  Height, weight, and BMI.  Blood pressure.  Lipid and cholesterol levels. These may be checked every 5 years, or more frequently if you are over 63 years old.  Skin check.  Lung cancer screening. You may have this screening every year starting at age 80 if you have a 30-pack-year history of smoking and currently smoke or have quit within the past 15 years.  Fecal occult blood test (FOBT) of the stool. You may have this test every year starting at age 23.  Flexible sigmoidoscopy or colonoscopy. You may have a sigmoidoscopy every 5 years or a colonoscopy every 10 years starting at age 51.  Prostate cancer screening. Recommendations will vary depending on your family history and other risks.  Hepatitis C blood test.  Hepatitis B blood test.  Sexually transmitted disease (STD) testing.  Diabetes screening. This is done by checking your blood sugar (glucose) after you have not eaten for a while (fasting). You may have this done every 1-3 years.  Abdominal aortic aneurysm (AAA) screening. You may need this if you are a current or former smoker.  Osteoporosis. You may be screened starting at age 34 if you are at high risk. Talk with your health care provider about your test results, treatment options, and if necessary, the need for  more tests. Vaccines  Your health care provider may recommend certain vaccines, such as:  Influenza vaccine. This is recommended every year.  Tetanus, diphtheria, and acellular pertussis (Tdap, Td) vaccine. You  may need a Td booster every 10 years.  Zoster vaccine. You may need this after age 33.  Pneumococcal 13-valent conjugate (PCV13) vaccine. One dose is recommended after age 45.  Pneumococcal polysaccharide (PPSV23) vaccine. One dose is recommended after age 20. Talk to your health care provider about which screenings and vaccines you need and how often you need them. This information is not intended to replace advice given to you by your health care provider. Make sure you discuss any questions you have with your health care provider. Document Released: 09/26/2015 Document Revised: 05/19/2016 Document Reviewed: 07/01/2015 Elsevier Interactive Patient Education  2017 [place] Prevention in the Home Falls can cause injuries. They can happen to people of all ages. There are many things you can do to make your home safe and to help prevent falls. What can I do on the outside of my home?  Regularly fix the edges of walkways and driveways and fix any cracks.  Remove anything that might make you trip as you walk through a door, such as a raised step or threshold.  Trim any bushes or trees on the path to your home.  Use bright outdoor lighting.  Clear any walking paths of anything that might make someone trip, such as rocks or tools.  Regularly check to see if handrails are loose or broken. Make sure that both sides of any steps have handrails.  Any raised decks and porches should have guardrails on the edges.  Have any leaves, snow, or ice cleared regularly.  Use sand or salt on walking paths during winter.  Clean up any spills in your garage right away. This includes oil or grease spills. What can I do in the bathroom?  Use night lights.  Install grab bars by the toilet and in the tub and shower. Do not use towel bars as grab bars.  Use non-skid mats or decals in the tub or shower.  If you need to sit down in the shower, use a plastic, non-slip stool.  Keep the floor  dry. Clean up any water that spills on the floor as soon as it happens.  Remove soap buildup in the tub or shower regularly.  Attach bath mats securely with double-sided non-slip rug tape.  Do not have throw rugs and other things on the floor that can make you trip. What can I do in the bedroom?  Use night lights.  Make sure that you have a light by your bed that is easy to reach.  Do not use any sheets or blankets that are too big for your bed. They should not hang down onto the floor.  Have a firm chair that has side arms. You can use this for support while you get dressed.  Do not have throw rugs and other things on the floor that can make you trip. What can I do in the kitchen?  Clean up any spills right away.  Avoid walking on wet floors.  Keep items that you use a lot in easy-to-reach places.  If you need to reach something above you, use a strong step stool that has a grab bar.  Keep electrical cords out of the way.  Do not use floor polish or wax that makes floors slippery. If you must use wax, use non-skid  floor wax.  Do not have throw rugs and other things on the floor that can make you trip. What can I do with my stairs?  Do not leave any items on the stairs.  Make sure that there are handrails on both sides of the stairs and use them. Fix handrails that are broken or loose. Make sure that handrails are as long as the stairways.  Check any carpeting to make sure that it is firmly attached to the stairs. Fix any carpet that is loose or worn.  Avoid having throw rugs at the top or bottom of the stairs. If you do have throw rugs, attach them to the floor with carpet tape.  Make sure that you have a light switch at the top of the stairs and the bottom of the stairs. If you do not have them, ask someone to add them for you. What else can I do to help prevent falls?  Wear shoes that:  Do not have high heels.  Have rubber bottoms.  Are comfortable and fit you  well.  Are closed at the toe. Do not wear sandals.  If you use a stepladder:  Make sure that it is fully opened. Do not climb a closed stepladder.  Make sure that both sides of the stepladder are locked into place.  Ask someone to hold it for you, if possible.  Clearly mark and make sure that you can see:  Any grab bars or handrails.  First and last steps.  Where the edge of each step is.  Use tools that help you move around (mobility aids) if they are needed. These include:  Canes.  Walkers.  Scooters.  Crutches.  Turn on the lights when you go into a dark area. Replace any light bulbs as soon as they burn out.  Set up your furniture so you have a clear path. Avoid moving your furniture around.  If any of your floors are uneven, fix them.  If there are any pets around you, be aware of where they are.  Review your medicines with your doctor. Some medicines can make you feel dizzy. This can increase your chance of falling. Ask your doctor what other things that you can do to help prevent falls. This information is not intended to replace advice given to you by your health care provider. Make sure you discuss any questions you have with your health care provider. Document Released: 06/26/2009 Document Revised: 02/05/2016 Document Reviewed: 10/04/2014 Elsevier Interactive Patient Education  2017 [employer].

## 2020-08-05 NOTE — Progress Notes (Signed)
I connected with Joe Hardy today by telephone and verified that I am speaking with the correct person using two identifiers. Location patient: home Location provider: work Persons participating in the virtual [place], [place] LPN.   I discussed the limitations, risks, security and privacy concerns of performing an evaluation and management service by telephone and the availability of in person appointments. I also discussed with the patient that there may be a patient responsible charge related to this service. The patient expressed understanding and verbally consented to this telephonic visit.    Interactive audio and video telecommunications were attempted between this provider and patient, however failed, due to patient having technical difficulties OR patient did not have access to video capability.  We continued and completed visit with audio only.     Vital signs may be patient reported or missing.  Subjective:   Joe Hardy is a 79 y.o. male who presents for Medicare Annual/Subsequent preventive examination.  Review of Systems     Cardiac Risk Factors include: advanced age (>25men, >64 women);sedentary lifestyle     Objective:    Today's Vitals   08/05/20 1516  Weight: 205 lb (93 kg)  Height: 5' 9.5" (1.765 m)   Body mass index is 29.84 kg/m.  Advanced Directives 08/05/2020 05/08/2019 05/02/2018 04/05/2017 05/13/2016 04/14/2015 04/14/2015  Does Patient Have a Medical Advance Directive? No No Yes No No No Yes  Type of Advance Directive - - Living will;Healthcare Power of Attorney - - - -  Does patient want to make changes to medical advance directive? - - - Yes (MAU/Ambulatory/Procedural Areas - Information given) - - -  Copy of [place] in Chart? - - No - copy requested - - - -  Would patient like information on creating a medical advance directive? - - - - Yes - Educational materials given Yes - Educational materials given -     Current Medications (verified) Outpatient Encounter Medications as of 08/05/2020  Medication Sig  . Ascorbic Acid (VITAMIN C) 1000 MG tablet Take 1,000 mg by mouth daily.  Marland Kitchen atorvastatin (LIPITOR) 20 MG tablet TAKE 1 TABLET BY MOUTH  DAILY AT 6PM  . b complex vitamins tablet Take 1 tablet by mouth daily.  . baclofen (LIORESAL) 10 MG tablet TAKE ONE-HALF TABLET BY  MOUTH TWICE DAILY AS NEEDED FOR MUSCLE SPASMS  . Blood Pressure Monitoring (SM WRIST CUFF BP MONITOR) MISC 1 Units by Does not apply route 2 (two) times a week.  Marland Kitchen buPROPion (WELLBUTRIN XL) 150 MG 24 hr tablet Take 450 mg by mouth daily.  . Calcium Carbonate (CALCIUM 600 PO) Take by mouth.  . cholecalciferol (VITAMIN D3) 25 MCG (1000 UT) tablet Take 4,000 Units by mouth daily.   . cyanocobalamin 1000 MCG tablet Take 1,000 mcg by mouth daily.  . ferrous sulfate 325 (65 FE) MG tablet Take 325 mg by mouth daily with breakfast.  . GARLIC PO Take by mouth.  . Iron Combinations (CHROMAGEN PO) Take by mouth.  . levothyroxine (SYNTHROID) 75 MCG tablet TAKE 1 TABLET BY MOUTH  DAILY BEFORE BREAKFAST  . lisinopril-hydrochlorothiazide (ZESTORETIC) 10-12.5 MG tablet TAKE 1 TABLET BY MOUTH  DAILY  . magnesium 30 MG tablet Take 400 mg by mouth daily.   . mirtazapine (REMERON) 30 MG tablet Take 30 mg by mouth at bedtime.   . Multiple Vitamin (MULTIVITAMIN WITH MINERALS) TABS tablet Take 1 tablet by mouth daily.  . NON FORMULARY Super Beta Prostate  . Omega-3 Fatty Acids (  FISH OIL) 1200 MG CAPS Take by mouth.  . Saw Palmetto 450 MG CAPS Take 450 mg by mouth daily.  Marland Kitchen terazosin (HYTRIN) 5 MG capsule TAKE 1 CAPSULE BY MOUTH  DAILY  . traZODone (DESYREL) 150 MG tablet Take 150 mg by mouth at bedtime.   . TURMERIC PO Take 500 mg by mouth 2 (two) times daily.   . busPIRone (BUSPAR) 5 MG tablet TAKE 1 TABLET BY MOUTH  DAILY FOR ANXIETY (Patient not taking: Reported on 08/05/2020)  . Potassium 95 MG TABS Take by mouth. (Patient not taking:  Reported on 08/05/2020)   No facility-administered encounter medications on file as of 08/05/2020.    Allergies (verified) Fluoxetine and Venlafaxine   History: Past Medical History:  Diagnosis Date  . Depression   . GERD (gastroesophageal reflux disease)   . Hyperlipidemia   . Hypertension   . Migraine   . Sleep apnea    Past Surgical History:  Procedure Laterality Date  . oral surgery    . TONSILLECTOMY     Family History  Problem Relation Age of Onset  . Tuberculosis Mother   . Asthma Mother   . Emphysema Father   . Heart attack Father    Social History   Socioeconomic History  . Marital status: Divorced    Spouse name: Not on file  . Number of children: Not on file  . Years of education: Not on file  . Highest education level: Bachelor's degree (e.g., BA, AB, BS)  Occupational History  . Occupation: retired  Tobacco Use  . Smoking status: Former Smoker    Packs/day: 1.50    Years: 20.00    Pack years: 30.00    Types: Cigarettes    Quit date: 05/02/2017    Years since quitting: 3.2  . Smokeless tobacco: Former Systems developer  . Tobacco comment: Starts and stops related to anxiety rather than drinking alcohol  Vaping Use  . Vaping Use: Never used  Substance and Sexual Activity  . 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 Drug use: No  . Sexual activity: Not Currently  Other Topics Concern  . Not on file  Social History Narrative  . Not on file   Social Determinants of Health   Financial Resource Strain: Low Risk   . Difficulty of Paying Living Expenses: Not hard at all  Food Insecurity: No Food Insecurity  . Worried About Charity fundraiser in the Last Year: Never true  . Ran Out of Food in the Last Year: Never true  Transportation Needs: No Transportation Needs  . Lack of Transportation (Medical): No  . Lack of Transportation (Non-Medical): No  Physical Activity: Inactive  . Days of  Exercise per Week: 0 days  . Minutes of Exercise per Session: 0 min  Stress: No Stress Concern Present  . Feeling of Stress : Not at all  Social Connections:   . Frequency of Communication with Friends and Family: Not on file  . Frequency of Social Gatherings with Friends and Family: Not on file  . Attends Religious Services: Not on file  . Active Member of Clubs or Organizations: Not on file  . Attends Archivist Meetings: Not on file  . Marital Status: Not on file    Tobacco Counseling Counseling given: Not Answered Comment: Starts and stops related to anxiety rather than drinking alcohol   Clinical Intake:  Pre-visit preparation completed: Yes  Pain : No/denies pain     Nutritional Status: BMI 25 -29 Overweight Nutritional Risks: None Diabetes: No  How often do you need to have someone help you when you read instructions, pamphlets, or other written materials from your doctor or pharmacy?: 1 - Never What is the last grade level you completed in school?: college  Diabetic? no  Interpreter Needed?: No  Information entered by :: NAllen LPN   Activities of Daily Living In your present state of health, do you have any difficulty performing the following activities: 08/05/2020  Hearing? Y  Comment slightly hard of hearing  Vision? N  Difficulty concentrating or making decisions? N  Walking or climbing stairs? Y  Comment due to knees  Dressing or bathing? N  Doing errands, shopping? N  Preparing Food and eating ? N  Using the Toilet? N  In the past six months, have you accidently leaked urine? Y  Do you have problems with loss of bowel control? N  Managing your Medications? N  Managing your Finances? N  Housekeeping or managing your Housekeeping? N  Some recent data might be hidden    Patient Care Team: Malfi, Lupita Raider, FNP as PCP - General (Family Medicine) Myer Haff, MD as Referring Physician (Psychiatry) Corrie Dandy, LCSW as Social Worker  (Psychiatry)  Indicate any recent Medical Services you may have received from other than [facility] providers in the past year (date may be approximate).     Assessment:   This is a routine wellness examination for Larnie.  Hearing/Vision screen  Hearing Screening   125Hz  250Hz  500Hz  1000Hz  2000Hz  3000Hz  4000Hz  6000Hz  8000Hz   Right ear:           Left ear:           Vision Screening Comments: Regular eye exams, [facility]  Dietary issues and exercise activities discussed: Current Exercise Habits: The patient does not participate in regular exercise at present  Goals    .  Exercise 3x per week (30 min per time)      Patient would like to lose 30 pounds over the next year. He will increase exercise (walking).    .  Increase water intake      Recommend drinking at least 6-8 glasses of water a day     .  Patient Stated      08/05/2020, to be able to not use walker    .  Reduce calorie intake to 2000 calories per day      Reduce calories from sugar sweetened beverages and concentrated sweets.     .  Reduced depressive symptoms. (pt-stated)      Pt would like to work with his psychiatrist to reduce depressive symptoms.     .  Weight < 200 lb (90.719 kg)      Wants to loss weight and having less depression      Depression Screen PHQ 2/9 Scores 08/05/2020 07/27/2019 05/08/2019 01/30/2019 12/15/2018 08/08/2018 05/02/2018  PHQ - 2 Score 0 0 0 - 0 0 0  PHQ- 9 Score 0 2 - - - 2 -  Exception Documentation - - - Patient refusal - - -    Fall Risk Fall Risk  08/05/2020 05/08/2019 01/30/2019 12/15/2018 05/02/2018  Falls in the past year? 0 0 0 0 Yes  Number falls in past yr: - - - - 1  Injury with Fall? - - - - Yes  Risk for fall due to : Impaired balance/gait;Medication side effect - - - -  Follow up Falls evaluation completed;Education provided;Falls prevention discussed - Falls evaluation completed Falls evaluation completed Falls evaluation completed    Any stairs in or around the  home? No  If so, are there any without handrails? n/a Home free of loose throw rugs in walkways, pet beds, electrical cords, etc? Yes  Adequate lighting in your home to reduce risk of falls? Yes   ASSISTIVE DEVICES UTILIZED TO PREVENT FALLS:  Life alert? No  Use of a cane, walker or w/c? Yes  Grab bars in the bathroom? No  Shower chair or bench in shower? No  Elevated toilet seat or a handicapped toilet? No   TIMED UP AND GO:  Was the test performed? No .    Cognitive Function: MMSE - Mini Mental State Exam 05/13/2016 04/14/2015  Orientation to time 5 5  Orientation to Place 5 5  Registration 3 3  Attention/ Calculation 5 5  Recall 3 3  Language- name 2 objects 2 2  Language- repeat 1 1  Language- follow 3 step command 3 3  Language- read & follow direction 1 1  Write a sentence 1 1  Copy design 1 1  Total score 30 30     6CIT Screen 08/05/2020 05/08/2019 05/02/2018 04/05/2017  What Year? 0 points 0 points 0 points 0 points  What month? 0 points 0 points 0 points 0 points  What time? 0 points 0 points 0 points 0 points  Count back from 20 0 points 0 points 0 points 0 points  Months in reverse 0 points 0 points 0 points 0 points  Repeat phrase 2 points 0 points 2 points 2 points  Total Score 2 0 2 2    Immunizations Immunization History  Administered Date(s) Administered  . Fluad Quad(high Dose 65+) 07/27/2019  . Influenza, High Dose Seasonal PF 07/18/2015, 06/23/2016, 07/11/2017, 08/08/2018  . Influenza-Unspecified 06/13/2014, 07/28/2020  . PFIZER SARS-COV-2 Vaccination 09/20/2019, 10/13/2019, 07/28/2020  . Pneumococcal Conjugate-13 03/13/2014  . Pneumococcal Polysaccharide-23 09/13/2012  . Zoster 09/13/2012    TDAP status: Up to date Flu Vaccine status: Up to date Pneumococcal vaccine status: Up to date Covid-19 vaccine status: Completed vaccines  Qualifies for Shingles Vaccine? Yes   Zostavax completed Yes   Shingrix Completed?: No.    Education has been  provided regarding the importance of this vaccine. Patient has been advised to call insurance company to determine out of pocket expense if they have not yet received this vaccine. Advised may also receive vaccine at local pharmacy or Health Dept. Verbalized acceptance and understanding.  Screening Tests Health Maintenance  Topic Date Due  . Hepatitis C Screening  Never done  . TETANUS/TDAP  04/16/2025  . INFLUENZA VACCINE  Completed  . COVID-19 Vaccine  Completed  . PNA vac Low Risk Adult  Completed    Health Maintenance  Health Maintenance Due  Topic Date Due  . Hepatitis C Screening  Never done    Colorectal cancer screening: Completed 09/13/2014. Repeat every 10 years  Lung Cancer Screening: (Low Dose CT Chest recommended if Age 68-80 years, 30 pack-year currently smoking OR have quit w/in 15years.) does not qualify.   Lung Cancer Screening Referral: no  Additional Screening:  Hepatitis C Screening: does qualify; due  Vision Screening: Recommended annual ophthalmology exams for early detection of glaucoma and other disorders of the eye. Is the patient up to date with their annual eye exam?  Yes  Who is the provider or what is the name of the office  in which the patient attends annual eye exams? [facility] If pt is not established with a provider, would they like to be referred to a provider to establish care? No .   Dental Screening: Recommended annual dental exams for proper oral hygiene  Community Resource Referral / Chronic Care Management: CRR required this visit?  No   CCM required this visit?  No      Plan:     I have personally reviewed and noted the following in the patient's chart:   . Medical and social history . Use of alcohol, tobacco or illicit drugs  . Current medications and supplements . Functional ability and status . Nutritional status . Physical activity . Advanced directives . List of other physicians . Hospitalizations,  surgeries, and ER visits in previous 12 months . Vitals . Screenings to include cognitive, depression, and falls . Referrals and appointments  In addition, I have reviewed and discussed with patient certain preventive protocols, quality metrics, and best practice recommendations. A written personalized care plan for preventive services as well as general preventive health recommendations were provided to patient.     Kellie Simmering, LPN   [id number]   Nurse Notes:

## 2020-08-13 ENCOUNTER — Ambulatory Visit: Payer: Medicare Other | Admitting: Family Medicine

## 2020-08-13 ENCOUNTER — Encounter: Payer: Medicare Other | Admitting: Family Medicine

## 2020-08-13 ENCOUNTER — Other Ambulatory Visit: Payer: Self-pay

## 2020-08-13 ENCOUNTER — Encounter: Payer: Self-pay | Admitting: Family Medicine

## 2020-08-13 ENCOUNTER — Ambulatory Visit (INDEPENDENT_AMBULATORY_CARE_PROVIDER_SITE_OTHER): Payer: Medicare Other | Admitting: Family Medicine

## 2020-08-13 VITALS — BP 134/88 | HR 100 | Temp 98.5°F | Resp 17 | Ht 69.5 in | Wt 219.0 lb

## 2020-08-13 DIAGNOSIS — R351 Nocturia: Secondary | ICD-10-CM | POA: Diagnosis not present

## 2020-08-13 DIAGNOSIS — E039 Hypothyroidism, unspecified: Secondary | ICD-10-CM | POA: Diagnosis not present

## 2020-08-13 DIAGNOSIS — E559 Vitamin D deficiency, unspecified: Secondary | ICD-10-CM | POA: Diagnosis not present

## 2020-08-13 DIAGNOSIS — F5104 Psychophysiologic insomnia: Secondary | ICD-10-CM

## 2020-08-13 DIAGNOSIS — I10 Essential (primary) hypertension: Secondary | ICD-10-CM

## 2020-08-13 DIAGNOSIS — Z8639 Personal history of other endocrine, nutritional and metabolic disease: Secondary | ICD-10-CM | POA: Diagnosis not present

## 2020-08-13 DIAGNOSIS — E7800 Pure hypercholesterolemia, unspecified: Secondary | ICD-10-CM

## 2020-08-13 DIAGNOSIS — E782 Mixed hyperlipidemia: Secondary | ICD-10-CM | POA: Diagnosis not present

## 2020-08-13 DIAGNOSIS — R7303 Prediabetes: Secondary | ICD-10-CM | POA: Diagnosis not present

## 2020-08-13 NOTE — Assessment & Plan Note (Signed)
Will have PSA drawn

## 2020-08-13 NOTE — Assessment & Plan Note (Signed)
Will have A1C drawn for evaluation of prediabetes

## 2020-08-13 NOTE — Assessment & Plan Note (Signed)
Status unknown.  Recheck labs.  Continue meds without changes today, levothyroxine 75 mcg daily.  Previously stable hypothyroidism.  F/U results, adjust medications as needed

## 2020-08-13 NOTE — Assessment & Plan Note (Signed)
Status unknown.  Recheck labs.  Followup after labs.  

## 2020-08-13 NOTE — Assessment & Plan Note (Signed)
Status unknown.  Recheck labs.  Will continue atorvastatin 20mg  daily, has been taking and tolerating well.  Once labs resulted, will adjust medication as needed.

## 2020-08-13 NOTE — Progress Notes (Signed)
Subjective:    Patient ID: Nino Amano, male    DOB: 01-30-1941, 79 y.o.   MRN: [id number]  Berel Najjar is a 79 y.o. male presenting on 08/13/2020 for Hypertension   HPI  Mr. Lindroth presents to clinic for a follow up on his hypertension, hypothyroidism, history of vitamin D deficiency, hyperlipidemia and insomnia.  Reports he has been managing these all well, he has no acute concerns today.  Hypertension - He is not checking BP at home or outside of clinic.    - Current medications: lisinopril-hydrochlorothiazide 10-12.5mg  daily, tolerating well without side effects - He is not currently symptomatic. - Pt denies headache, lightheadedness, dizziness, changes in vision, chest tightness/pressure, palpitations, leg swelling, sudden loss of speech or loss of consciousness. - He  reports no regular exercise routine. - His diet is high in salt, high in fat, and high in carbohydrates.    Depression screen [facility] 2/9 08/05/2020 07/27/2019 05/08/2019  Decreased Interest 0 0 0  Down, Depressed, Hopeless 0 0 0  PHQ - 2 Score 0 0 0  Altered sleeping 0 2 -  Tired, decreased energy 0 0 -  Change in appetite 0 0 -  Feeling bad or failure about yourself  0 0 -  Trouble concentrating 0 0 -  Moving slowly or fidgety/restless 0 0 -  Suicidal thoughts 0 0 -  PHQ-9 Score 0 2 -  Difficult doing work/chores Not difficult at all Not difficult at all -    Social History   Tobacco Use  . Smoking status: Former Smoker    Packs/day: 1.50    Years: 20.00    Pack years: 30.00    Types: Cigarettes    Quit date: 05/02/2017    Years since quitting: 3.2  . Smokeless tobacco: Former Systems developer  . Tobacco comment: Starts and stops related to anxiety rather than drinking alcohol  Vaping Use  . Vaping Use: Never used  Substance Use Topics  . 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 Drug use: No    Review of Systems  Constitutional:  Negative.   HENT: Negative.   Eyes: Negative.   Respiratory: Negative.   Cardiovascular: Negative.   Gastrointestinal: Negative.   Endocrine: Negative.   Genitourinary: Negative.   Musculoskeletal: Negative.   Skin: Negative.   Allergic/Immunologic: Negative.   Neurological: Negative.   Hematological: Negative.   Psychiatric/Behavioral: Negative.    Per HPI unless specifically indicated above     Objective:    BP 134/88 (BP Location: Right Arm, Patient Position: Sitting, Cuff Size: Large)   Pulse 100   Temp 98.5 F (36.9 C) (Oral)   Resp 17   Ht 5' 9.5" (1.765 m)   Wt 219 lb (99.3 kg)   SpO2 100%   BMI 31.88 kg/m   Wt Readings from Last 3 Encounters:  08/13/20 219 lb (99.3 kg)  08/05/20 205 lb (93 kg)  02/14/20 213 lb (96.6 kg)    Physical Exam Vitals and nursing note reviewed.  Constitutional:      General: He is not in acute distress.    Appearance: Normal appearance. He is well-developed and well-groomed. He is obese. He is not ill-appearing or toxic-appearing.  HENT:     Head: Normocephalic and atraumatic.     Nose:     Comments: [name] is in place, covering mouth and nose. Eyes:     General:  Right eye: No discharge.        Left eye: No discharge.     Extraocular Movements: Extraocular movements intact.     Conjunctiva/sclera: Conjunctivae normal.     Pupils: Pupils are equal, round, and reactive to light.  Neck:     Thyroid: No thyroid mass, thyromegaly or thyroid tenderness.  Cardiovascular:     Rate and Rhythm: Normal rate and regular rhythm.     Pulses: Normal pulses.     Heart sounds: Normal heart sounds. No murmur heard.  No friction rub. No gallop.   Pulmonary:     Effort: Pulmonary effort is normal. No respiratory distress.     Breath sounds: Normal breath sounds.  Musculoskeletal:     Right lower leg: No edema.     Left lower leg: No edema.  Lymphadenopathy:     Cervical: No cervical adenopathy.  Skin:    General: Skin is warm  and dry.     Capillary Refill: Capillary refill takes less than 2 seconds.  Neurological:     General: No focal deficit present.     Mental Status: He is alert and oriented to person, place, and time.  Psychiatric:        Attention and Perception: Attention and perception normal.        Mood and Affect: Mood and affect normal.        Speech: Speech normal.        Behavior: Behavior normal. Behavior is cooperative.        Thought Content: Thought content normal.        Cognition and Memory: Cognition and memory normal.    Results for orders placed or performed in visit on 02/14/20  Urine Culture   Specimen: Urine  Result Value Ref Range   MICRO NUMBER: [id number]    SPECIMEN QUALITY: Adequate    Sample Source URINE    STATUS: FINAL    Result: No Growth   POCT glycosylated hemoglobin (Hb A1C)  Result Value Ref Range   Hemoglobin A1C 5.4 4.0 - 5.6 %   HbA1c POC (<> result, manual entry)     HbA1c, POC (prediabetic range)     HbA1c, POC (controlled diabetic range)    POCT Urinalysis Dipstick  Result Value Ref Range   Color, UA Yellow    Clarity, UA clear    Glucose, UA Negative Negative   Bilirubin, UA negative    Ketones, UA negative    Spec Grav, UA <=1.005 (A) 1.010 - 1.025   Blood, UA negaitve    pH, UA 5.0 5.0 - 8.0   Protein, UA Negative Negative   Urobilinogen, UA 0.2 0.2 or 1.0 E.U./dL   Nitrite, UA negative    Leukocytes, UA Large (3+) (A) Negative   Appearance     Odor        Assessment & Plan:   Problem List Items Addressed This Visit      Cardiovascular and Mediastinum   Essential hypertension - Primary    Controlled hypertension.  BP is at goal.  Pt reports working on lifestyle modifications.  Taking medications tolerating well without side effects.  Complications:  Obesity, prediabetes, HLD, hypothyroidism  Plan: 1. Continue taking lisinopril-hydrochlorothiazide 10-12.5mg  daily 2. Obtain labs today  3. Encouraged heart healthy diet and increasing  exercise to 30 minutes most days of the week, going no more than 2 days in a row without exercise. 4. Check BP 1-2 x per week at home, keep log, and  bring to clinic at next appointment. 5. Follow up 6 months.       Relevant Orders   CBC with Differential   COMPLETE METABOLIC PANEL WITH GFR     Endocrine   Hypothyroidism    Status unknown.  Recheck labs.  Continue meds without changes today, levothyroxine 75 mcg daily.  Previously stable hypothyroidism.  F/U results, adjust medications as needed       Relevant Orders   TSH + free T4     Other   Insomnia    Reports is stable and well controlled with mirtazapine, with the exception of the night before any provider appointments.  States he has difficulty falling/staying asleep the night before appointments, he has concerns that he would oversleep/miss the appointment/be late.  States did not sleep well last night but typically does do well.  Reviewed sleep hygiene.  Sleep hygiene handout provided.      History of iron deficiency    Status unknown.  Recheck labs.  Followup after labs.       Pre-diabetes    Will have A1C drawn for evaluation of prediabetes      Relevant Orders   HgB A1c   Hypercholesteremia    Status unknown.  Recheck labs.  Will continue atorvastatin 20mg  daily, has been taking and tolerating well.  Once labs resulted, will adjust medication as needed.      Relevant Orders   Lipid Profile   Nocturia    Will have PSA drawn      Relevant Orders   PSA   Vitamin D deficiency    Status unknown.  Recheck labs.  Followup after labs.       Relevant Orders   VITAMIN D 25 Hydroxy (Vit-D Deficiency, Fractures)      No orders of the defined types were placed in this encounter.  Follow up plan: Return in about 6 months (around 02/11/2021) for HTN F/U.   Harlin Rain, [place] [facility] Nurse Practitioner [facility] 08/13/2020, 10:38 AM

## 2020-08-13 NOTE — Assessment & Plan Note (Signed)
Controlled hypertension.  BP is at goal.  Pt reports working on lifestyle modifications.  Taking medications tolerating well without side effects.  Complications:  Obesity, prediabetes, HLD, hypothyroidism  Plan: 1. Continue taking lisinopril-hydrochlorothiazide 10-12.5mg  daily 2. Obtain labs today  3. Encouraged heart healthy diet and increasing exercise to 30 minutes most days of the week, going no more than 2 days in a row without exercise. 4. Check BP 1-2 x per week at home, keep log, and bring to clinic at next appointment. 5. Follow up 6 months.

## 2020-08-13 NOTE — Patient Instructions (Signed)
Have your labs drawn and we will contact you with the results.  Continue all of your medications as directed  Try to get exercise a minimum of 30 minutes per day at least 5 days per week as well as  adequate water intake all while measuring blood pressure a few times per week.  Keep a blood pressure log and bring back to clinic at your next visit.  If your readings are consistently over 130/80 to contact our office/send me a MyChart message and we will see you sooner.  Can try DASH and Mediterranean diet options, avoiding processed foods, lowering sodium intake, avoiding pork products, and eating a plant based diet for optimal health.  We will plan to see you back in 6 months for follow up visit  You will receive a survey after today's visit either digitally by e-mail or paper by [place] mail. Your experiences and feedback matter to [place].  Please respond so we know how we are doing as we provide care for you.  Call us with any questions/concerns/needs.  It is my goal to be available to you for your health concerns.  Thanks for choosing me to be a partner in your healthcare needs!  Harlin Rain, FNP-C Family Nurse Practitioner [facility] Phone: [phone]

## 2020-08-13 NOTE — Assessment & Plan Note (Signed)
Reports is stable and well controlled with mirtazapine, with the exception of the night before any provider appointments.  States he has difficulty falling/staying asleep the night before appointments, he has concerns that he would oversleep/miss the appointment/be late.  States did not sleep well last night but typically does do well.  Reviewed sleep hygiene.  Sleep hygiene handout provided.

## 2020-08-14 ENCOUNTER — Ambulatory Visit: Payer: Medicare Other | Admitting: Family Medicine

## 2020-08-14 LAB — COMPLETE METABOLIC PANEL WITH GFR
AG Ratio: 1.7 (calc) (ref 1.0–2.5)
ALT: 19 U/L (ref 9–46)
AST: 21 U/L (ref 10–35)
Albumin: 4.5 g/dL (ref 3.6–5.1)
Alkaline phosphatase (APISO): 49 U/L (ref 35–144)
BUN: 17 mg/dL (ref 7–25)
CO2: 28 mmol/L (ref 20–32)
Calcium: 10 mg/dL (ref 8.6–10.3)
Chloride: 102 mmol/L (ref 98–110)
Creat: 0.95 mg/dL (ref 0.70–1.18)
GFR, Est African American: 88 mL/min/{1.73_m2} (ref >=60)
GFR, Est Non African American: 76 mL/min/{1.73_m2} (ref >=60)
Globulin: 2.6 g/dL (calc) (ref 1.9–3.7)
Glucose, Bld: 103 mg/dL — ABNORMAL HIGH (ref 65–99)
Potassium: 4.2 mmol/L (ref 3.5–5.3)
Sodium: 137 mmol/L (ref 135–146)
Total Bilirubin: 0.5 mg/dL (ref 0.2–1.2)
Total Protein: 7.1 g/dL (ref 6.1–8.1)

## 2020-08-14 LAB — TSH+FREE T4: TSH W/REFLEX TO FT4: 2.64 mIU/L (ref 0.40–4.50)

## 2020-08-14 LAB — CBC WITH DIFFERENTIAL/PLATELET
Absolute Monocytes: 596 {cells}/uL (ref 200–950)
Basophils Absolute: 40 {cells}/uL (ref 0–200)
Basophils Relative: 0.6 %
Eosinophils Absolute: 67 {cells}/uL (ref 15–500)
Eosinophils Relative: 1 %
HCT: 47.8 % (ref 38.5–50.0)
Hemoglobin: 15.4 g/dL (ref 13.2–17.1)
Lymphs Abs: 1313 {cells}/uL (ref 850–3900)
MCH: 22.7 pg — ABNORMAL LOW (ref 27.0–33.0)
MCHC: 32.2 g/dL (ref 32.0–36.0)
MCV: 70.4 fL — ABNORMAL LOW (ref 80.0–100.0)
MPV: 9.6 fL (ref 7.5–12.5)
Monocytes Relative: 8.9 %
Neutro Abs: 4683 {cells}/uL (ref 1500–7800)
Neutrophils Relative %: 69.9 %
Platelets: 213 Thousand/uL (ref 140–400)
RBC: 6.79 Million/uL — ABNORMAL HIGH (ref 4.20–5.80)
RDW: 17.2 % — ABNORMAL HIGH (ref 11.0–15.0)
Total Lymphocyte: 19.6 %
WBC: 6.7 Thousand/uL (ref 3.8–10.8)

## 2020-08-14 LAB — LIPID PANEL
Cholesterol: 125 mg/dL (ref <200)
HDL: 33 mg/dL — ABNORMAL LOW (ref >=40)
LDL Cholesterol (Calc): 67 mg/dL (calc)
Non-HDL Cholesterol (Calc): 92 mg/dL (calc) (ref <130)
Total CHOL/HDL Ratio: 3.8 (calc) (ref <5.0)
Triglycerides: 178 mg/dL — ABNORMAL HIGH (ref <150)

## 2020-08-14 LAB — HEMOGLOBIN A1C
Hgb A1c MFr Bld: 5.6 %{Hb}
Mean Plasma Glucose: 114 (calc)
eAG (mmol/L): 6.3 (calc)

## 2020-08-14 LAB — VITAMIN D 25 HYDROXY (VIT D DEFICIENCY, FRACTURES): Vit D, 25-Hydroxy: 77 ng/mL (ref 30–100)

## 2020-08-14 LAB — PSA: PSA: 1.69 ng/mL

## 2020-08-22 ENCOUNTER — Encounter: Payer: Medicare Other | Admitting: Family Medicine

## 2020-09-01 ENCOUNTER — Other Ambulatory Visit: Payer: Self-pay | Admitting: Family Medicine

## 2020-09-01 DIAGNOSIS — E7800 Pure hypercholesterolemia, unspecified: Secondary | ICD-10-CM

## 2020-09-01 DIAGNOSIS — I10 Essential (primary) hypertension: Secondary | ICD-10-CM

## 2020-09-25 ENCOUNTER — Other Ambulatory Visit: Payer: Self-pay | Admitting: Family Medicine

## 2020-11-26 ENCOUNTER — Other Ambulatory Visit: Payer: Self-pay

## 2020-11-26 DIAGNOSIS — N401 Enlarged prostate with lower urinary tract symptoms: Secondary | ICD-10-CM

## 2020-11-26 DIAGNOSIS — N138 Other obstructive and reflux uropathy: Secondary | ICD-10-CM

## 2020-11-26 DIAGNOSIS — E034 Atrophy of thyroid (acquired): Secondary | ICD-10-CM

## 2020-11-26 MED ORDER — TERAZOSIN HCL 5 MG PO CAPS: 5.0000 mg | ORAL_CAPSULE | Freq: Every day | ORAL | 3 refills | Status: DC

## 2020-11-26 MED ORDER — LEVOTHYROXINE SODIUM 75 MCG PO TABS: 75.0000 ug | ORAL_TABLET | Freq: Every day | ORAL | 3 refills | Status: DC

## 2020-12-16 ENCOUNTER — Other Ambulatory Visit: Payer: Self-pay

## 2020-12-16 DIAGNOSIS — M159 Polyosteoarthritis, unspecified: Secondary | ICD-10-CM

## 2020-12-16 DIAGNOSIS — F3341 Major depressive disorder, recurrent, in partial remission: Secondary | ICD-10-CM

## 2020-12-16 MED ORDER — BACLOFEN 10 MG PO TABS: 10.0000 mg | ORAL_TABLET | Freq: Every day | ORAL | 1 refills | Status: DC

## 2020-12-16 MED ORDER — BUSPIRONE HCL 5 MG PO TABS: ORAL_TABLET | ORAL | 0 refills | Status: DC

## 2021-02-04 ENCOUNTER — Ambulatory Visit: Payer: Medicare Other | Admitting: Internal Medicine

## 2021-02-11 ENCOUNTER — Ambulatory Visit: Payer: Medicare Other | Admitting: Family Medicine

## 2021-02-13 ENCOUNTER — Ambulatory Visit: Payer: Self-pay | Admitting: *Deleted

## 2021-02-13 DIAGNOSIS — R509 Fever, unspecified: Secondary | ICD-10-CM | POA: Diagnosis not present

## 2021-02-13 DIAGNOSIS — U071 COVID-19: Secondary | ICD-10-CM | POA: Diagnosis not present

## 2021-02-13 DIAGNOSIS — Z20822 Contact with and (suspected) exposure to covid-19: Secondary | ICD-10-CM | POA: Diagnosis not present

## 2021-02-13 NOTE — Telephone Encounter (Signed)
Patient called back to see if any response from PCP. No documentation noted. Patient c/o temp has increased to 101.2. reports he is afraid he has covid. C/o sore throat that may be from sleeping under a fan. Cough, productive and nausea. Denies headache, shortness of breath, no muscle pain. Patient anxious . Instructed patient to go to UC or ED for symptoms and to get tested for covid. Patient reports he is drinking plenty of fluids . Patient reports he does not take OTC medication and does not want to take reccommended tylenol for fever. Care advise given. Patient verbalized understanding of care advise and to call back or go to UC or ED for symptoms at this time.   Reason for Disposition . [1] Fever > 101 F (38.3 C) AND [2] age > 60 years  Answer Assessment - Initial Assessment Questions 1. COVID-19 DIAGNOSIS: "Who made your COVID-19 diagnosis?" "Was it confirmed by a positive lab test or self-test?" If not diagnosed by a doctor (or NP/PA), ask "Are there lots of cases (community spread) where you live?" Note: See public health department website, if unsure.     Has not tested   2. COVID-19 EXPOSURE: "Was there any known exposure to COVID before the symptoms began?" CDC Definition of close contact: within 6 feet (2 meters) for a total of 15 minutes or more over a 24-hour period.      No  3. ONSET: "When did the COVID-19 symptoms start?"      This am  4. WORST SYMPTOM: "What is your worst symptom?" (e.g., cough, fever, shortness of breath, muscle aches)     Fever, 101.2 , sore throat, cough  5. COUGH: "Do you have a cough?" If Yes, ask: "How bad is the cough?"       Yes productive cough  6. FEVER: "Do you have a fever?" If Yes, ask: "What is your temperature, how was it measured, and when did it start?"     Yes 101.2 7. RESPIRATORY STATUS: "Describe your breathing?" (e.g., shortness of breath, wheezing, unable to speak)      na 8. BETTER-SAME-WORSE: "Are you getting better, staying the same or  getting worse compared to yesterday?"  If getting worse, ask, "In what way?"     na 9. HIGH RISK DISEASE: "Do you have any chronic medical problems?" (e.g., asthma, heart or lung disease, weak immune system, obesity, etc.)     no 10. VACCINE: "Have you had the COVID-19 vaccine?" If Yes, ask: "Which one, how many shots, when did you get it?"       Yes pfizer 11. BOOSTER: "Have you received your COVID-19 booster?" If Yes, ask: "Which one and when did you get it?"       Yes and 2nd booster Pfizer 12. PREGNANCY: "Is there any chance you are pregnant?" "When was your last menstrual period?"       na 13. OTHER SYMPTOMS: "Do you have any other symptoms?"  (e.g., chills, fatigue, headache, loss of smell or taste, muscle pain, sore throat)       Sore throat , fever, nausea cough 14. O2 SATURATION MONITOR:  "Do you use an oxygen saturation monitor (pulse oximeter) at home?" If Yes, ask "What is your reading (oxygen level) today?" "What is your usual oxygen saturation reading?" (e.g., 95%)       na  Protocols used: CORONAVIRUS (COVID-19) DIAGNOSED OR SUSPECTED-A-AH

## 2021-02-16 ENCOUNTER — Ambulatory Visit (INDEPENDENT_AMBULATORY_CARE_PROVIDER_SITE_OTHER): Payer: Medicare Other | Admitting: Internal Medicine

## 2021-02-16 ENCOUNTER — Encounter: Payer: Self-pay | Admitting: Internal Medicine

## 2021-02-16 ENCOUNTER — Other Ambulatory Visit: Payer: Self-pay

## 2021-02-16 ENCOUNTER — Telehealth: Payer: Self-pay

## 2021-02-16 ENCOUNTER — Ambulatory Visit: Payer: Self-pay | Admitting: *Deleted

## 2021-02-16 VITALS — Temp 98.6°F

## 2021-02-16 DIAGNOSIS — U071 COVID-19: Secondary | ICD-10-CM

## 2021-02-16 NOTE — Progress Notes (Addendum)
Virtual Visit via Telephone Note  I connected with Joe Hardy on 02/16/21 at  4:00 PM EDT by telephone and verified that I am speaking with the correct person using two identifiers.  Location: Patient: Home Provider: Office  Person's participating in this telephone call: Webb Silversmith, NP-C and Joe Hardy.   I discussed the limitations of evaluation and management by telephone and the availability of in person appointments. The patient expressed understanding and agreed to proceed.  History of Present Illness:  Pt reports sore throat, cough and chest congestion and fever. He reports this started 3 days ago. He denies difficulty swallowing. The cough is productive of white mucous. The chest congestion seems to be breaking up. He is no longer running a fever. He denies headache, runny nose, nasal congestion, ear pain, chest pain or SOB. He denies chills or body aches. He went to UC for the same 6/3, diagnosed with covid. He was advised to take Ibuprofen, Tylenol and Robitussin OTC. He has not had sick contacts. He has had his vaccines.   Past Medical History:  Diagnosis Date  . Depression   . GERD (gastroesophageal reflux disease)   . Hyperlipidemia   . Hypertension   . Migraine   . Sleep apnea     Current Outpatient Medications  Medication Sig Dispense Refill  . Ascorbic Acid (VITAMIN C) 1000 MG tablet Take 1,000 mg by mouth daily.    Marland Kitchen atorvastatin (LIPITOR) 20 MG tablet TAKE 1 TABLET BY MOUTH  DAILY AT 6PM 90 tablet 3  . b complex vitamins tablet Take 1 tablet by mouth daily.    . baclofen (LIORESAL) 10 MG tablet Take 1 tablet (10 mg total) by mouth daily. 30 each 1  . Blood Pressure Monitoring (SM WRIST CUFF BP MONITOR) MISC 1 Units by Does not apply route 2 (two) times a week. 1 each 0  . buPROPion (WELLBUTRIN XL) 150 MG 24 hr tablet Take 450 mg by mouth daily.    . busPIRone (BUSPAR) 5 MG tablet TAKE 1 TABLET BY MOUTH  DAILY FOR ANXIETY 90 tablet 0  . Calcium Carbonate  (CALCIUM 600 PO) Take by mouth.    . cholecalciferol (VITAMIN D3) 25 MCG (1000 UT) tablet Take 4,000 Units by mouth daily.     . cyanocobalamin 1000 MCG tablet Take 1,000 mcg by mouth daily.    . ferrous sulfate 325 (65 FE) MG tablet Take 325 mg by mouth daily with breakfast.    . GARLIC PO Take by mouth.    . Iron Combinations (CHROMAGEN PO) Take by mouth.    . levothyroxine (SYNTHROID) 75 MCG tablet Take 1 tablet (75 mcg total) by mouth daily before breakfast. 90 tablet 3  . lisinopril-hydrochlorothiazide (ZESTORETIC) 10-12.5 MG tablet TAKE 1 TABLET BY MOUTH  DAILY 90 tablet 3  . magnesium 30 MG tablet Take 400 mg by mouth daily.     . mirtazapine (REMERON) 30 MG tablet Take 30 mg by mouth at bedtime.     . Multiple Vitamin (MULTIVITAMIN WITH MINERALS) TABS tablet Take 1 tablet by mouth daily.    . NON FORMULARY Super Beta Prostate    . Omega-3 Fatty Acids (FISH OIL) 1200 MG CAPS Take by mouth.    . Saw Palmetto 450 MG CAPS Take 450 mg by mouth daily.    Marland Kitchen terazosin (HYTRIN) 5 MG capsule Take 1 capsule (5 mg total) by mouth daily. 90 capsule 3  . traZODone (DESYREL) 150 MG tablet Take 150 mg by mouth  at bedtime.     . TURMERIC PO Take 500 mg by mouth 2 (two) times daily.      No current facility-administered medications for this visit.    Allergies  Allergen Reactions  . Fluoxetine     insomnia  . Venlafaxine     difficulty sleeping    Family History  Problem Relation Age of Onset  . Tuberculosis Mother   . Asthma Mother   . Emphysema Father   . Heart attack Father     Social History   Socioeconomic History  . Marital status: Divorced    Spouse name: Not on file  . Number of children: Not on file  . Years of education: Not on file  . Highest education level: Bachelor's degree (e.g., BA, AB, BS)  Occupational History  . Occupation: retired  Tobacco Use  . Smoking status: Former Smoker    Packs/day: 1.50    Years: 20.00    Pack years: 30.00    Types: Cigarettes     Quit date: 05/02/2017    Years since quitting: 3.7  . Smokeless tobacco: Former Systems developer  . Tobacco comment: Starts and stops related to anxiety rather than drinking alcohol  Vaping Use  . Vaping Use: Never used  Substance and Sexual Activity  . 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 Drug use: No  . Sexual activity: Not Currently  Other Topics Concern  . Not on file  Social History Narrative  . Not on file   Social Determinants of Health   Financial Resource Strain: Low Risk   . Difficulty of Paying Living Expenses: Not hard at all  Food Insecurity: No Food Insecurity  . Worried About Charity fundraiser in the Last Year: Never true  . Ran Out of Food in the Last Year: Never true  Transportation Needs: No Transportation Needs  . Lack of Transportation (Medical): No  . Lack of Transportation (Non-Medical): No  Physical Activity: Inactive  . Days of Exercise per Week: 0 days  . Minutes of Exercise per Session: 0 min  Stress: No Stress Concern Present  . Feeling of Stress : Not at all  Social Connections: Not on file  Intimate Partner Violence: Not on file     Constitutional: Denies fever, malaise, fatigue, headache or abrupt weight changes.  HEENT: Pt reports sore throat. Denies eye pain, eye redness, ear pain, ringing in the ears, wax buildup, runny nose, nasal congestion, bloody nose. Respiratory: Pt reports cough and chest congestion. Denies difficulty breathing, shortness of breath, or sputum production.   Cardiovascular: Denies chest pain, chest tightness, palpitations or swelling in the hands or feet.  Gastrointestinal: Denies abdominal pain, bloating, constipation, diarrhea or blood in the stool.   No other specific complaints in a complete review of systems (except as listed in HPI above).    Observations/Objective:  Temp 98.6 F (37 C) (Oral)   Wt Readings from Last 3 Encounters:  08/13/20 219 lb  (99.3 kg)  08/05/20 205 lb (93 kg)  02/14/20 213 lb (96.6 kg)    General: Appears his stated age, well developed, well nourished in NAD. HEENT: Head: normal shape and size; Nose: no congestion noted; Throat/Mouth: hoarseness noted.  Pulmonary/Chest: Normal effort. No respiratory distress.   Neurological: Alert and oriented.   BMET    Component Value Date/Time   NA 137 08/13/2020 0944   NA 140 04/20/2017 0925  K 4.2 08/13/2020 0944   CL 102 08/13/2020 0944   CO2 28 08/13/2020 0944   GLUCOSE 103 (H) 08/13/2020 0944   BUN 17 08/13/2020 0944   BUN 9 04/20/2017 0925   CREATININE 0.95 08/13/2020 0944   CALCIUM 10.0 08/13/2020 0944   GFRNONAA 76 08/13/2020 0944   GFRAA 88 08/13/2020 0944    Lipid Panel     Component Value Date/Time   CHOL 125 08/13/2020 0944   CHOL 113 04/20/2017 0925   TRIG 178 (H) 08/13/2020 0944   HDL 33 (L) 08/13/2020 0944   HDL 26 (L) 04/20/2017 0925   CHOLHDL 3.8 08/13/2020 0944   VLDL 24 08/17/2016 0001   LDLCALC 67 08/13/2020 0944    CBC    Component Value Date/Time   WBC 6.7 08/13/2020 0944   RBC 6.79 (H) 08/13/2020 0944   HGB 15.4 08/13/2020 0944   HGB 13.0 04/22/2015 0818   HCT 47.8 08/13/2020 0944   HCT 38.5 04/22/2015 0818   PLT 213 08/13/2020 0944   PLT 257 04/22/2015 0818   MCV 70.4 (L) 08/13/2020 0944   MCV 69 (L) 04/22/2015 0818   MCH 22.7 (L) 08/13/2020 0944   MCHC 32.2 08/13/2020 0944   RDW 17.2 (H) 08/13/2020 0944   RDW 16.0 (H) 04/22/2015 0818   LYMPHSABS 1,313 08/13/2020 0944   LYMPHSABS 2.4 04/22/2015 0818   MONOABS 630 08/17/2016 0001   EOSABS 67 08/13/2020 0944   EOSABS 0.2 04/22/2015 0818   BASOSABS 40 08/13/2020 0944   BASOSABS 0.0 04/22/2015 0818    Hgb A1C Lab Results  Component Value Date   HGBA1C 5.6 08/13/2020       Assessment and Plan:  Covid 19:  Clinically improving per patient Discussed oral antiviral but the fact that he is feeling better, will hold off at this time Continue rest and  fluids Encouraged Tylenol, Ibuprofen and Robitussin  Return precautions discussed  Follow Up Instructions:    I discussed the assessment and treatment plan with the patient. The patient was provided an opportunity to ask questions and all were answered. The patient agreed with the plan and demonstrated an understanding of the instructions.   The patient was advised to call back or seek an in-person evaluation if the symptoms worsen or if the condition fails to improve as anticipated.  I provided 11:02 minutes of non-face-to-face time during this encounter.   Webb Silversmith, NP

## 2021-02-16 NOTE — Telephone Encounter (Signed)
I returned pt's call.  I answered his question pertaining to how long to quarantine for Covid.  He tested positive on 02/13/2021 at the urgent care. He has a virtual appt with Webb Silversmith, NP for tomorrow at 11:00.    Questions answered.  Reason for Disposition . [1] [id number] diagnosed by positive lab test (e.g., PCR, rapid self-test kit) AND [2] mild symptoms (e.g., cough, fever, others) AND [5] no complications or SOB  Answer Assessment - Initial Assessment Questions 1. COVID-19 DIAGNOSIS: "Who made your COVID-19 diagnosis?" "Was it confirmed by a positive lab test or self-test?" If not diagnosed by a doctor (or NP/PA), ask "Are there lots of cases (community spread) where you live?" Note: See public health department website, if unsure.     Friday I'm positive for Covid at urgent care.   I'm congested and weak.   I'm taking ibuprofen and Robofed for congestion.   I'm not eating anything solid by choice.    I'm eating a lot fruit and veggies.    2. COVID-19 EXPOSURE: "Was there any known exposure to COVID before the symptoms began?" CDC Definition of close contact: within 6 feet (2 meters) for a total of 15 minutes or more over a 24-hour period.      Not sure when 3. ONSET: "When did the COVID-19 symptoms start?"      02/13/2021 last Friday 4. WORST SYMPTOM: "What is your worst symptom?" (e.g., cough, fever, shortness of breath, muscle aches)     Congestion 5. COUGH: "Do you have a cough?" If Yes, ask: "How bad is the cough?"       Yes  6. FEVER: "Do you have a fever?" If Yes, ask: "What is your temperature, how was it measured, and when did it start?"     Not now but I did in the beginning 7. RESPIRATORY STATUS: "Describe your breathing?" (e.g., shortness of breath, wheezing, unable to speak)      Fine 8. BETTER-SAME-WORSE: "Are you getting better, staying the same or getting worse compared to yesterday?"  If getting worse, ask, "In what way?"     Better 9. HIGH RISK DISEASE: "Do you have  any chronic medical problems?" (e.g., asthma, heart or lung disease, weak immune system, obesity, etc.)     No problems with my health  10. VACCINE: "Have you had the COVID-19 vaccine?" If Yes, ask: "Which one, how many shots, when did you get it?"       Yes 11. BOOSTER: "Have you received your COVID-19 booster?" If Yes, ask: "Which one and when did you get it?"       Yes both booster 12. PREGNANCY: "Is there any chance you are pregnant?" "When was your last menstrual period?"       N/A 13. OTHER SYMPTOMS: "Do you have any other symptoms?"  (e.g., chills, fatigue, headache, loss of smell or taste, muscle pain, sore throat)       Fatigue, sore throat, nasal congestion.   14. O2 SATURATION MONITOR:  "Do you use an oxygen saturation monitor (pulse oximeter) at home?" If Yes, ask "What is your reading (oxygen level) today?" "What is your usual oxygen saturation reading?" (e.g., 95%)       No  Protocols used: CORONAVIRUS (COVID-19) DIAGNOSED OR SUSPECTED-A-AH

## 2021-02-16 NOTE — Telephone Encounter (Signed)
Copied from [place] [id number]. Topic: General - Other >> Feb 13, 2021  1:10 PM Leward Quan A wrote: Reason for CRM: Patient called in to inform Webb Silversmith that he woke up this morning with a sore throat, temp 101.2, hot inside his mouth and some congestion. Say that he have been up under a fan and dont know if that is what caused this but would like to know what to do Ph# [phone]   The pt currently has a virtual appt scheduled for tomorrow. He was seen at the Urgent care and diagnose with COVID on 02/13/21.

## 2021-02-16 NOTE — Patient Instructions (Signed)

## 2021-02-17 ENCOUNTER — Ambulatory Visit: Payer: Medicare Other | Admitting: Internal Medicine

## 2021-02-17 ENCOUNTER — Telehealth: Payer: Medicare Other | Admitting: Internal Medicine

## 2021-02-18 ENCOUNTER — Telehealth: Payer: Self-pay

## 2021-02-18 NOTE — Telephone Encounter (Signed)
Copied from [place] [id number]. Topic: General - Other >> Feb 18, 2021 12:10 PM Celene Kras wrote: Reason for CRM: Pt called stating that he received a call from # [phone]. No documentation was left on chart. Pt is requesting to have a call back. Please advise.   I informed the patient that we didn't reach out to him. I recommended that her check his message. If no message was left and its important that they reach him they will call back.

## 2021-03-09 ENCOUNTER — Encounter: Payer: Self-pay | Admitting: Internal Medicine

## 2021-03-09 ENCOUNTER — Other Ambulatory Visit: Payer: Self-pay

## 2021-03-09 ENCOUNTER — Ambulatory Visit (INDEPENDENT_AMBULATORY_CARE_PROVIDER_SITE_OTHER): Payer: Medicare Other | Admitting: Internal Medicine

## 2021-03-09 VITALS — BP 161/92 | HR 93 | Temp 98.4°F | Wt 206.0 lb

## 2021-03-09 DIAGNOSIS — Z6828 Body mass index (BMI) 28.0-28.9, adult: Secondary | ICD-10-CM | POA: Insufficient documentation

## 2021-03-09 DIAGNOSIS — D508 Other iron deficiency anemias: Secondary | ICD-10-CM

## 2021-03-09 DIAGNOSIS — E039 Hypothyroidism, unspecified: Secondary | ICD-10-CM | POA: Diagnosis not present

## 2021-03-09 DIAGNOSIS — R7303 Prediabetes: Secondary | ICD-10-CM

## 2021-03-09 DIAGNOSIS — K5901 Slow transit constipation: Secondary | ICD-10-CM | POA: Diagnosis not present

## 2021-03-09 DIAGNOSIS — N401 Enlarged prostate with lower urinary tract symptoms: Secondary | ICD-10-CM

## 2021-03-09 DIAGNOSIS — F3341 Major depressive disorder, recurrent, in partial remission: Secondary | ICD-10-CM | POA: Diagnosis not present

## 2021-03-09 DIAGNOSIS — M159 Polyosteoarthritis, unspecified: Secondary | ICD-10-CM

## 2021-03-09 DIAGNOSIS — N138 Other obstructive and reflux uropathy: Secondary | ICD-10-CM

## 2021-03-09 DIAGNOSIS — G43C1 Periodic headache syndromes in child or adult, intractable: Secondary | ICD-10-CM | POA: Diagnosis not present

## 2021-03-09 DIAGNOSIS — E782 Mixed hyperlipidemia: Secondary | ICD-10-CM | POA: Diagnosis not present

## 2021-03-09 DIAGNOSIS — E663 Overweight: Secondary | ICD-10-CM | POA: Insufficient documentation

## 2021-03-09 DIAGNOSIS — D509 Iron deficiency anemia, unspecified: Secondary | ICD-10-CM | POA: Insufficient documentation

## 2021-03-09 DIAGNOSIS — I10 Essential (primary) hypertension: Secondary | ICD-10-CM | POA: Diagnosis not present

## 2021-03-09 DIAGNOSIS — F5104 Psychophysiologic insomnia: Secondary | ICD-10-CM

## 2021-03-09 DIAGNOSIS — M8949 Other hypertrophic osteoarthropathy, multiple sites: Secondary | ICD-10-CM

## 2021-03-09 NOTE — Assessment & Plan Note (Signed)
Continue Saw Palmetto and Terazosin Will monitor

## 2021-03-09 NOTE — Assessment & Plan Note (Signed)
Currently not an issue Will monitor 

## 2021-03-09 NOTE — Assessment & Plan Note (Signed)
Encouraged diet and exercise for weight loss ?

## 2021-03-09 NOTE — Assessment & Plan Note (Signed)
A1C today Encouraged him to consume a low carb diet and exercise for weight loss

## 2021-03-09 NOTE — Assessment & Plan Note (Signed)
Stable on Mirtazapine and Wellbutrin Support offered

## 2021-03-09 NOTE — Assessment & Plan Note (Signed)
TSH and Free T4 today Will adjust Levothyroxine if needed based on labs 

## 2021-03-09 NOTE — Assessment & Plan Note (Signed)
Encouraged regular physical activity Encouraged weight loss as this can help reduce reflux symptoms Continue Tumeric OTC

## 2021-03-09 NOTE — Assessment & Plan Note (Signed)
Uncontrolled but he is insistent that this is due to anxiety Continue Lisinopril HCT for now CMET today Will monitor

## 2021-03-09 NOTE — Assessment & Plan Note (Signed)
CBC today Not currently taking oral iron 

## 2021-03-09 NOTE — Patient Instructions (Signed)
https://www.nhlbi.nih.gov/files/docs/public/heart/dash_brief.pdf">  DASH Eating Plan DASH stands for Dietary Approaches to Stop Hypertension. The DASH eating plan is a healthy eating plan that has been shown to: Reduce high blood pressure (hypertension). Reduce your risk for type 2 diabetes, heart disease, and stroke. Help with weight loss. What are tips for following this plan? Reading food labels Check food labels for the amount of salt (sodium) per serving. Choose foods with less than 5 percent of the Daily Value of sodium. Generally, foods with less than 300 milligrams (mg) of sodium per serving fit into this eating plan. To find whole grains, look for the word "whole" as the first word in the ingredient list. Shopping Buy products labeled as "low-sodium" or "no salt added." Buy fresh foods. Avoid canned foods and pre-made or frozen meals. Cooking Avoid adding salt when cooking. Use salt-free seasonings or herbs instead of table salt or sea salt. Check with your health care provider or pharmacist before using salt substitutes. Do not fry foods. Cook foods using healthy methods such as baking, boiling, grilling, roasting, and broiling instead. Cook with heart-healthy oils, such as olive, canola, avocado, soybean, or sunflower oil. Meal planning  Eat a balanced diet that includes: 4 or more servings of fruits and 4 or more servings of vegetables each day. Try to fill one-half of your plate with fruits and vegetables. 6-8 servings of whole grains each day. Less than 6 oz (170 g) of lean meat, poultry, or fish each day. A 3-oz (85-g) serving of meat is about the same size as a deck of cards. One egg equals 1 oz (28 g). 2-3 servings of low-fat dairy each day. One serving is 1 cup (237 mL). 1 serving of nuts, seeds, or beans 5 times each week. 2-3 servings of heart-healthy fats. Healthy fats called omega-3 fatty acids are found in foods such as walnuts, flaxseeds, fortified milks, and eggs.  These fats are also found in cold-water fish, such as sardines, salmon, and mackerel. Limit how much you eat of: Canned or prepackaged foods. Food that is high in trans fat, such as some fried foods. Food that is high in saturated fat, such as fatty meat. Desserts and other sweets, sugary drinks, and other foods with added sugar. Full-fat dairy products. Do not salt foods before eating. Do not eat more than 4 egg yolks a week. Try to eat at least 2 vegetarian meals a week. Eat more home-cooked food and less restaurant, buffet, and fast food.  Lifestyle When eating at a restaurant, ask that your food be prepared with less salt or no salt, if possible. If you drink alcohol: Limit how much you use to: 0-1 drink a day for women who are not pregnant. 0-2 drinks a day for men. Be aware of how much alcohol is in your drink. In the [place] one drink equals one 12 oz bottle of beer (355 mL), one 5 oz glass of wine (148 mL), or one 1 oz glass of hard liquor (44 mL). General information Avoid eating more than 2,300 mg of salt a day. If you have hypertension, you may need to reduce your sodium intake to 1,500 mg a day. Work with your health care provider to maintain a healthy body weight or to lose weight. Ask what an ideal weight is for you. Get at least 30 minutes of exercise that causes your heart to beat faster (aerobic exercise) most days of the week. Activities may include walking, swimming, or biking. Work with your health care provider   or dietitian to adjust your eating plan to your individual calorie needs. What foods should I eat? Fruits All fresh, dried, or frozen fruit. Canned fruit in natural juice (without addedsugar). Vegetables Fresh or frozen vegetables (raw, steamed, roasted, or grilled). Low-sodium or reduced-sodium tomato and vegetable juice. Low-sodium or reduced-sodium tomatosauce and tomato paste. Low-sodium or reduced-sodium canned vegetables. Grains Whole-grain or  whole-wheat bread. Whole-grain or whole-wheat pasta. Brown rice. Oatmeal. Quinoa. Bulgur. Whole-grain and low-sodium cereals. Pita bread.Low-fat, low-sodium crackers. Whole-wheat flour tortillas. Meats and other proteins Skinless chicken or turkey. Ground chicken or turkey. Pork with fat trimmed off. Fish and seafood. Egg whites. Dried beans, peas, or lentils. Unsalted nuts, nut butters, and seeds. Unsalted canned beans. Lean cuts of beef with fat trimmed off. Low-sodium, lean precooked or cured meat, such as sausages or meatloaves. Dairy Low-fat (1%) or fat-free (skim) milk. Reduced-fat, low-fat, or fat-free cheeses. Nonfat, low-sodium ricotta or cottage cheese. Low-fat or nonfatyogurt. Low-fat, low-sodium cheese. Fats and oils Soft margarine without trans fats. Vegetable oil. Reduced-fat, low-fat, or light mayonnaise and salad dressings (reduced-sodium). Canola, safflower, olive, avocado, soybean, andsunflower oils. Avocado. Seasonings and condiments Herbs. Spices. Seasoning mixes without salt. Other foods Unsalted popcorn and pretzels. Fat-free sweets. The items listed above may not be a complete list of foods and beverages you can eat. Contact a dietitian for more information. What foods should I avoid? Fruits Canned fruit in a light or heavy syrup. Fried fruit. Fruit in cream or buttersauce. Vegetables Creamed or fried vegetables. Vegetables in a cheese sauce. Regular canned vegetables (not low-sodium or reduced-sodium). Regular canned tomato sauce and paste (not low-sodium or reduced-sodium). Regular tomato and vegetable juice(not low-sodium or reduced-sodium). Pickles. Olives. Grains Baked goods made with fat, such as croissants, muffins, or some breads. Drypasta or rice meal packs. Meats and other proteins Fatty cuts of meat. Ribs. Fried meat. Bacon. Bologna, salami, and other precooked or cured meats, such as sausages or meat loaves. Fat from the back of a pig (fatback). Bratwurst.  Salted nuts and seeds. Canned beans with added salt. Canned orsmoked fish. Whole eggs or egg yolks. Chicken or turkey with skin. Dairy Whole or 2% milk, cream, and half-and-half. Whole or full-fat cream cheese. Whole-fat or sweetened yogurt. Full-fat cheese. Nondairy creamers. Whippedtoppings. Processed cheese and cheese spreads. Fats and oils Butter. Stick margarine. Lard. Shortening. Ghee. Bacon fat. Tropical oils, suchas coconut, palm kernel, or palm oil. Seasonings and condiments Onion salt, garlic salt, seasoned salt, table salt, and sea salt. Worcestershire sauce. Tartar sauce. Barbecue sauce. Teriyaki sauce. Soy sauce, including reduced-sodium. Steak sauce. Canned and packaged gravies. Fish sauce. Oyster sauce. Cocktail sauce. Store-bought horseradish. Ketchup. Mustard. Meat flavorings and tenderizers. Bouillon cubes. Hot sauces. Pre-made or packaged marinades. Pre-made or packaged taco seasonings. Relishes. Regular saladdressings. Other foods Salted popcorn and pretzels. The items listed above may not be a complete list of foods and beverages you should avoid. Contact a dietitian for more information. Where to find more information National Heart, Lung, and Blood Institute: www.nhlbi.nih.gov American Heart Association: www.heart.org Academy of Nutrition and Dietetics: www.eatright.org National Kidney Foundation: www.kidney.org Summary The DASH eating plan is a healthy eating plan that has been shown to reduce high blood pressure (hypertension). It may also reduce your risk for type 2 diabetes, heart disease, and stroke. When on the DASH eating plan, aim to eat more fresh fruits and vegetables, whole grains, lean proteins, low-fat dairy, and heart-healthy fats. With the DASH eating plan, you should limit salt (sodium) intake to 2,300   mg a day. If you have hypertension, you may need to reduce your sodium intake to 1,500 mg a day. Work with your health care provider or dietitian to adjust  your eating plan to your individual calorie needs. This information is not intended to replace advice given to you by your health care provider. Make sure you discuss any questions you have with your healthcare provider. Document Revised: 08/03/2019 Document Reviewed: 08/03/2019 Elsevier Patient Education  2022 Elsevier Inc.  

## 2021-03-09 NOTE — Assessment & Plan Note (Signed)
Encouraged high fiber diet and adequate water intake Continue Magnesium

## 2021-03-09 NOTE — Assessment & Plan Note (Signed)
Stable on his current dose of Trazadone

## 2021-03-09 NOTE — Progress Notes (Signed)
Subjective:    Patient ID: Joe Hardy, male    DOB: 12-02-1940, 80 y.o.   MRN: [id number]  HPI  Patient presents the clinic today for follow-up of chronic conditions.  He is establishing care with me today, transferring care from [facility], NP.  HTN: His BP today is 161/92.  He is taking Lisinopril HCT as prescribed. He reports he has white coat syndrome. ECG from 06/2009 reviewed.  Migraines: No longer an issue. He does not take any medication for this.  Hypothyroidism: He denies any issues on his current dose of Levothyroxine. He does not follow with endocrinology.  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 He takes Tumeric OTC with some relief of symptoms. He does not follow with orthopedics.  Constipation: Managed with Magnesium OTC. He does not follow with GI.  BPH: He reports urinary urgency. He is taking Saw Palmetto and Terazosin as prescribed. He does not follow with urology.  HLD: His last LDL was 67, triglycerides 178, 08/2020. He denies myalgias on Atorvastatin. He tries to consume a low fat diet.  Anxiety and Depression: Chronic, managed on Mirtazapine, Wellbutrin. He is not currently seeing a therapist. He denies SI/HI.  Insomnia: He has difficulty falling asleep and staying asleep. He is taking Trazadone as prescribed. There is no sleep study on file.   Prediabetes: His last A1C was 5.6%, 08/2020. He is not taking any oral diabetic medication at this time. He does not check his sugars.  Iron Deficiency Anemia: His last H/H was 15.4/47.8, 08/2020. He is no longer takes oral iron as prescribed. He does not follow with hematology.  Review of Systems     Past Medical History:  Diagnosis Date   Depression    GERD (gastroesophageal reflux disease)    Hyperlipidemia    Hypertension    Migraine    Sleep apnea     Current Outpatient Medications  Medication Sig Dispense Refill   Ascorbic Acid (VITAMIN C) 1000 MG tablet Take 1,000 mg by mouth daily.     atorvastatin  (LIPITOR) 20 MG tablet TAKE 1 TABLET BY MOUTH  DAILY AT 6PM 90 tablet 3   b complex vitamins tablet Take 1 tablet by mouth daily.     baclofen (LIORESAL) 10 MG tablet Take 1 tablet (10 mg total) by mouth daily. 30 each 1   Blood Pressure Monitoring (SM WRIST CUFF BP MONITOR) MISC 1 Units by Does not apply route 2 (two) times a week. 1 each 0   buPROPion (WELLBUTRIN XL) 150 MG 24 hr tablet Take 450 mg by mouth daily.     busPIRone (BUSPAR) 5 MG tablet TAKE 1 TABLET BY MOUTH  DAILY FOR ANXIETY (Patient taking differently: TAKE 1 TABLET BY MOUTH  DAILY FOR ANXIETY) 90 tablet 0   cholecalciferol (VITAMIN D3) 25 MCG (1000 UT) tablet Take 4,000 Units by mouth daily.      cyanocobalamin 1000 MCG tablet Take 1,000 mcg by mouth daily.     Flaxseed, Linseed, (FLAX SEEDS PO) Take by mouth.     GARLIC PO Take by mouth.     levothyroxine (SYNTHROID) 75 MCG tablet Take 1 tablet (75 mcg total) by mouth daily before breakfast. 90 tablet 3   lisinopril-hydrochlorothiazide (ZESTORETIC) 10-12.5 MG tablet TAKE 1 TABLET BY MOUTH  DAILY 90 tablet 3   magnesium 30 MG tablet Take 400 mg by mouth daily.     mirtazapine (REMERON) 30 MG tablet Take 30 mg by mouth at bedtime.     Multiple Vitamin (  MULTIVITAMIN WITH MINERALS) TABS tablet Take 1 tablet by mouth daily.     Saw Palmetto 450 MG CAPS Take 450 mg by mouth daily.     terazosin (HYTRIN) 5 MG capsule Take 1 capsule (5 mg total) by mouth daily. 90 capsule 3   traZODone (DESYREL) 150 MG tablet Take 150 mg by mouth at bedtime.     TURMERIC PO Take 500 mg by mouth 2 (two) times daily.     Calcium Carbonate (CALCIUM 600 PO) Take by mouth. (Patient not taking: Reported on 03/09/2021)     ferrous sulfate 325 (65 FE) MG tablet Take 325 mg by mouth daily with breakfast. (Patient not taking: No sig reported)     Iron Combinations (CHROMAGEN PO) Take by mouth. (Patient not taking: No sig reported)     NON FORMULARY Super Beta Prostate (Patient not taking: Reported on 02/16/2021)      Omega-3 Fatty Acids (FISH OIL) 1200 MG CAPS Take by mouth. (Patient not taking: No sig reported)     No current facility-administered medications for this visit.    Allergies  Allergen Reactions   Fluoxetine     insomnia   Venlafaxine     difficulty sleeping    Family History  Problem Relation Age of Onset   Tuberculosis Mother    Asthma Mother    Emphysema Father    Heart attack Father     Social History   Socioeconomic History   Marital status: Divorced    Spouse name: Not on file   Number of children: Not on file   Years of education: Not on file   Highest education level: Bachelor's degree (e.g., BA, AB, BS)  Occupational History   Occupation: retired  Tobacco Use   Smoking status: Former    Packs/day: 1.50    Years: 20.00    Pack years: 30.00    Types: Cigarettes    Quit date: 05/02/2017    Years since quitting: 3.8   Smokeless tobacco: Former   Tobacco comments:    Starts and stops related to anxiety rather than drinking alcohol  Vaping Use   Vaping Use: Never used  Substance and Sexual Activity   Alcohol use: No    Alcohol/week: 0.0 standard drinks    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)   Drug use: No   Sexual activity: Not Currently  Other Topics Concern   Not on file  Social History Narrative   Not on file   Social Determinants of Health   Financial Resource Strain: Low Risk    Difficulty of Paying Living Expenses: Not hard at all  Food Insecurity: No Food Insecurity   Worried About Charity fundraiser in the Last Year: Never true   [place] in the Last Year: Never true  Transportation Needs: No Transportation Needs   Lack of Transportation (Medical): No   Lack of Transportation (Non-Medical): No  Physical Activity: Inactive   Days of Exercise per Week: 0 days   Minutes of Exercise per Session: 0 min  Stress: No Stress Concern Present   Feeling of Stress : Not at all  Social Connections: Not on file  Intimate  Partner Violence: Not on file     Constitutional: Denies fever, malaise, fatigue, headaches, or abrupt weight changes.  HEENT: Denies eye pain, eye redness, ear pain, ringing in the ears, wax buildup, runny nose, nasal congestion, bloody nose, or sore throat. Respiratory: Denies difficulty breathing, shortness of breath, cough or sputum  production.   Cardiovascular: Denies chest pain, chest tightness, palpitations or swelling in the hands or feet.  Gastrointestinal: Pt reports constipation. Denies abdominal pain, bloating, diarrhea or blood in the stool.  GU: Pt reports urinary urgency. Denies frequency, pain with urination, burning sensation, blood in urine, odor or discharge. Musculoskeletal: Pt reports joint pain. Denies decrease in range of motion, difficulty with gait, muscle pain or joint swelling.  Skin: Denies redness, rashes, lesions or ulcercations.  Neurological: Pt reports insomnia. Denies dizziness, difficulty with memory, difficulty with speech or problems with balance and coordination.  Psych: Pt has a history of anxiety and depression. Denies  SI/HI.  No other specific complaints in a complete review of systems (except as listed in HPI above).  Objective:   Physical Exam  BP (!) 161/92 (BP Location: Left Arm, Patient Position: Sitting, Cuff Size: Large)   Pulse 93   Temp 98.4 F (36.9 C) (Temporal)   Wt 206 lb (93.4 kg)   SpO2 99%   BMI 29.98 kg/m  Wt Readings from Last 3 Encounters:  03/09/21 206 lb (93.4 kg)  08/13/20 219 lb (99.3 kg)  08/05/20 205 lb (93 kg)    General: Appears his stated age, overweight in NAD. Skin: Warm, dry and intact. No ulcerations noted. HEENT: Head: normal shape and size; Eyes: sclera white and EOMs intact;  Neck:  Neck supple, trachea midline. No masses, lumps or thyromegaly present.  Cardiovascular: Normal rate and rhythm. S1,S2 noted.  No murmur, rubs or gallops noted. No JVD or BLE edema. No carotid bruits noted. Pulmonary/Chest:  Normal effort and positive vesicular breath sounds. No respiratory distress. No wheezes, rales or ronchi noted.  Abdomen: Normal bowel sounds.  Musculoskeletal: Gait slow, slightly unsteady without device. Can stand on heels and toes. Unable to tandem walk. Neurological: Alert and oriented. Psychiatric: Mood and affect normal. Behavior is normal. Judgment and thought content normal.    BMET    Component Value Date/Time   NA 137 08/13/2020 0944   NA 140 04/20/2017 0925   K 4.2 08/13/2020 0944   CL 102 08/13/2020 0944   CO2 28 08/13/2020 0944   GLUCOSE 103 (H) 08/13/2020 0944   BUN 17 08/13/2020 0944   BUN 9 04/20/2017 0925   CREATININE 0.95 08/13/2020 0944   CALCIUM 10.0 08/13/2020 0944   GFRNONAA 76 08/13/2020 0944   GFRAA 88 08/13/2020 0944    Lipid Panel     Component Value Date/Time   CHOL 125 08/13/2020 0944   CHOL 113 04/20/2017 0925   TRIG 178 (H) 08/13/2020 0944   HDL 33 (L) 08/13/2020 0944   HDL 26 (L) 04/20/2017 0925   CHOLHDL 3.8 08/13/2020 0944   VLDL 24 08/17/2016 0001   LDLCALC 67 08/13/2020 0944    CBC    Component Value Date/Time   WBC 6.7 08/13/2020 0944   RBC 6.79 (H) 08/13/2020 0944   HGB 15.4 08/13/2020 0944   HGB 13.0 04/22/2015 0818   HCT 47.8 08/13/2020 0944   HCT 38.5 04/22/2015 0818   PLT 213 08/13/2020 0944   PLT 257 04/22/2015 0818   MCV 70.4 (L) 08/13/2020 0944   MCV 69 (L) 04/22/2015 0818   MCH 22.7 (L) 08/13/2020 0944   MCHC 32.2 08/13/2020 0944   RDW 17.2 (H) 08/13/2020 0944   RDW 16.0 (H) 04/22/2015 0818   LYMPHSABS 1,313 08/13/2020 0944   LYMPHSABS 2.4 04/22/2015 0818   MONOABS 630 08/17/2016 0001   EOSABS 67 08/13/2020 0944   EOSABS 0.2  04/22/2015 0818   BASOSABS 40 08/13/2020 0944   BASOSABS 0.0 04/22/2015 0818    Hgb A1C Lab Results  Component Value Date   HGBA1C 5.6 08/13/2020            Assessment & Plan:   Webb Silversmith, NP  This visit occurred during the SARS-CoV-2 public health emergency.  Safety  protocols were in place, including screening questions prior to the visit, additional usage of staff PPE, and extensive cleaning of exam room while observing appropriate contact time as indicated for disinfecting solutions.

## 2021-03-09 NOTE — Assessment & Plan Note (Signed)
CMET and lipid profile today °Encouraged him to consume a low fat diet °Continue Atorvastatin °

## 2021-03-10 LAB — LIPID PANEL
Cholesterol: 124 mg/dL (ref <200)
HDL: 37 mg/dL — ABNORMAL LOW (ref >=40)
LDL Cholesterol (Calc): 67 mg/dL (calc)
Non-HDL Cholesterol (Calc): 87 mg/dL (calc) (ref <130)
Total CHOL/HDL Ratio: 3.4 (calc) (ref <5.0)
Triglycerides: 117 mg/dL (ref <150)

## 2021-03-10 LAB — COMPLETE METABOLIC PANEL WITH GFR
AG Ratio: 1.9 (calc) (ref 1.0–2.5)
ALT: 13 U/L (ref 9–46)
AST: 18 U/L (ref 10–35)
Albumin: 4.3 g/dL (ref 3.6–5.1)
Alkaline phosphatase (APISO): 46 U/L (ref 35–144)
BUN: 9 mg/dL (ref 7–25)
CO2: 27 mmol/L (ref 20–32)
Calcium: 9.6 mg/dL (ref 8.6–10.3)
Chloride: 105 mmol/L (ref 98–110)
Creat: 0.8 mg/dL (ref 0.70–1.18)
GFR, Est African American: 98 mL/min/{1.73_m2} (ref >=60)
GFR, Est Non African American: 85 mL/min/{1.73_m2} (ref >=60)
Globulin: 2.3 g/dL (calc) (ref 1.9–3.7)
Glucose, Bld: 87 mg/dL (ref 65–99)
Potassium: 4.2 mmol/L (ref 3.5–5.3)
Sodium: 140 mmol/L (ref 135–146)
Total Bilirubin: 0.5 mg/dL (ref 0.2–1.2)
Total Protein: 6.6 g/dL (ref 6.1–8.1)

## 2021-03-10 LAB — CBC WITH DIFFERENTIAL/PLATELET
Absolute Monocytes: 581 cells/uL (ref 200–950)
Basophils Absolute: 42 cells/uL (ref 0–200)
Basophils Relative: 0.6 %
Eosinophils Absolute: 119 cells/uL (ref 15–500)
Eosinophils Relative: 1.7 %
HCT: 46.3 % (ref 38.5–50.0)
Hemoglobin: 13.9 g/dL (ref 13.2–17.1)
Lymphs Abs: 1757 cells/uL (ref 850–3900)
MCH: 21.7 pg — ABNORMAL LOW (ref 27.0–33.0)
MCHC: 30 g/dL — ABNORMAL LOW (ref 32.0–36.0)
MCV: 72.2 fL — ABNORMAL LOW (ref 80.0–100.0)
MPV: 9.2 fL (ref 7.5–12.5)
Monocytes Relative: 8.3 %
Neutro Abs: 4501 cells/uL (ref 1500–7800)
Neutrophils Relative %: 64.3 %
Platelets: 254 10*3/uL (ref 140–400)
RBC: 6.41 10*6/uL — ABNORMAL HIGH (ref 4.20–5.80)
RDW: 17.6 % — ABNORMAL HIGH (ref 11.0–15.0)
Total Lymphocyte: 25.1 %
WBC: 7 10*3/uL (ref 3.8–10.8)

## 2021-03-10 LAB — HEMOGLOBIN A1C
Hgb A1c MFr Bld: 5.6 % of total Hgb (ref <5.7)
Mean Plasma Glucose: 114 mg/dL
eAG (mmol/L): 6.3 mmol/L

## 2021-03-10 LAB — T4, FREE: Free T4: 1.4 ng/dL (ref 0.8–1.8)

## 2021-03-10 LAB — TSH: TSH: 1.31 mIU/L (ref 0.40–4.50)

## 2021-05-29 ENCOUNTER — Other Ambulatory Visit: Payer: Self-pay | Admitting: Family Medicine

## 2021-05-29 DIAGNOSIS — F3341 Major depressive disorder, recurrent, in partial remission: Secondary | ICD-10-CM

## 2021-05-29 DIAGNOSIS — M159 Polyosteoarthritis, unspecified: Secondary | ICD-10-CM

## 2021-06-03 ENCOUNTER — Other Ambulatory Visit: Payer: Self-pay

## 2021-06-03 DIAGNOSIS — E7800 Pure hypercholesterolemia, unspecified: Secondary | ICD-10-CM

## 2021-06-03 DIAGNOSIS — I10 Essential (primary) hypertension: Secondary | ICD-10-CM

## 2021-06-03 MED ORDER — LISINOPRIL-HYDROCHLOROTHIAZIDE 10-12.5 MG PO TABS: 1.0000 | ORAL_TABLET | Freq: Every day | ORAL | 3 refills | Status: DC

## 2021-06-03 MED ORDER — ATORVASTATIN CALCIUM 20 MG PO TABS: ORAL_TABLET | ORAL | 3 refills | Status: DC

## 2021-08-10 ENCOUNTER — Ambulatory Visit: Payer: Medicare Other

## 2021-08-25 ENCOUNTER — Ambulatory Visit (INDEPENDENT_AMBULATORY_CARE_PROVIDER_SITE_OTHER): Payer: Medicare Other | Admitting: Internal Medicine

## 2021-08-25 ENCOUNTER — Other Ambulatory Visit: Payer: Self-pay

## 2021-08-25 ENCOUNTER — Encounter: Payer: Self-pay | Admitting: Internal Medicine

## 2021-08-25 VITALS — BP 145/90 | HR 111 | Temp 97.3°F | Resp 18 | Ht 69.5 in | Wt 197.0 lb

## 2021-08-25 DIAGNOSIS — E039 Hypothyroidism, unspecified: Secondary | ICD-10-CM | POA: Diagnosis not present

## 2021-08-25 DIAGNOSIS — E782 Mixed hyperlipidemia: Secondary | ICD-10-CM

## 2021-08-25 DIAGNOSIS — R7303 Prediabetes: Secondary | ICD-10-CM | POA: Diagnosis not present

## 2021-08-25 DIAGNOSIS — E663 Overweight: Secondary | ICD-10-CM

## 2021-08-25 DIAGNOSIS — Z6828 Body mass index (BMI) 28.0-28.9, adult: Secondary | ICD-10-CM

## 2021-08-25 DIAGNOSIS — Z0001 Encounter for general adult medical examination with abnormal findings: Secondary | ICD-10-CM | POA: Diagnosis not present

## 2021-08-25 DIAGNOSIS — Z125 Encounter for screening for malignant neoplasm of prostate: Secondary | ICD-10-CM | POA: Diagnosis not present

## 2021-08-25 NOTE — Progress Notes (Signed)
Subjective:    Patient ID: Joe Hardy, male    DOB: Oct 03, 1940, 80 y.o.   MRN: [id number]  HPI  Pt presents to the  clinic today for his annual exam.  Flu: 06/2021 Tetanus: < 10 years ago Covid: Pfizer x 5 Pneumovax: 09/2012 Prevnar: 03/2014 Zostovax: 09/2012 Shingrix: never PSA Screening: 08/2020 Colon Screening: 2016 Vision Screening: annually Dentist: as needed  Diet: He does not eat meat. He eats more veggies than fruits. He tries to avoid fried foods. He drinks mostly water, tea. Exercise: Walking  Review of Systems   Past Medical History:  Diagnosis Date   Depression    GERD (gastroesophageal reflux disease)    Hyperlipidemia    Hypertension    Migraine    Sleep apnea     Current Outpatient Medications  Medication Sig Dispense Refill   Ascorbic Acid (VITAMIN C) 1000 MG tablet Take 1,000 mg by mouth daily.     atorvastatin (LIPITOR) 20 MG tablet TAKE 1 TABLET BY MOUTH  DAILY AT 6PM 90 tablet 3   b complex vitamins tablet Take 1 tablet by mouth daily.     Blood Pressure Monitoring (SM WRIST CUFF BP MONITOR) MISC 1 Units by Does not apply route 2 (two) times a week. 1 each 0   buPROPion (WELLBUTRIN XL) 150 MG 24 hr tablet Take 450 mg by mouth daily.     cholecalciferol (VITAMIN D3) 25 MCG (1000 UT) tablet Take 4,000 Units by mouth daily.      cyanocobalamin 1000 MCG tablet Take 1,000 mcg by mouth daily.     ferrous sulfate 325 (65 FE) MG tablet Take 325 mg by mouth daily with breakfast. (Patient not taking: No sig reported)     Flaxseed, Linseed, (FLAX SEEDS PO) Take by mouth.     GARLIC PO Take by mouth.     levothyroxine (SYNTHROID) 75 MCG tablet Take 1 tablet (75 mcg total) by mouth daily before breakfast. 90 tablet 3   lisinopril-hydrochlorothiazide (ZESTORETIC) 10-12.5 MG tablet Take 1 tablet by mouth daily. 90 tablet 3   magnesium 30 MG tablet Take 400 mg by mouth daily.     mirtazapine (REMERON) 30 MG tablet Take 30 mg by mouth at bedtime.      Multiple Vitamin (MULTIVITAMIN WITH MINERALS) TABS tablet Take 1 tablet by mouth daily.     Saw Palmetto 450 MG CAPS Take 450 mg by mouth daily.     terazosin (HYTRIN) 5 MG capsule Take 1 capsule (5 mg total) by mouth daily. 90 capsule 3   traZODone (DESYREL) 150 MG tablet Take 150 mg by mouth at bedtime.     TURMERIC PO Take 500 mg by mouth 2 (two) times daily.     No current facility-administered medications for this visit.    Allergies  Allergen Reactions   Fluoxetine     insomnia   Venlafaxine     difficulty sleeping    Family History  Problem Relation Age of Onset   Tuberculosis Mother    Asthma Mother    Emphysema Father    Heart attack Father     Social History   Socioeconomic History   Marital status: Divorced    Spouse name: Not on file   Number of children: Not on file   Years of education: Not on file   Highest education level: Bachelor's degree (e.g., BA, AB, BS)  Occupational History   Occupation: retired  Tobacco Use   Smoking status: Former    Packs/day:  1.50    Years: 20.00    Pack years: 30.00    Types: Cigarettes    Quit date: 05/02/2017    Years since quitting: 4.3   Smokeless tobacco: Former   Tobacco comments:    Starts and stops related to anxiety rather than drinking alcohol  Vaping Use   Vaping Use: Never used  Substance and Sexual Activity   Alcohol use: No    Alcohol/week: 0.0 standard drinks    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 Denies fever, malaise, fatigue, or abrupt  weight changes.  HEENT: Denies eye pain, eye redness, ear pain, ringing in the ears, wax buildup, runny nose, nasal congestion, bloody nose, or sore throat. Respiratory: Denies difficulty breathing, shortness of breath, cough or sputum production.   Cardiovascular: Denies chest pain, chest tightness, palpitations or swelling in the hands or feet.  Gastrointestinal: Pt reports constipation. Denies abdominal pain, bloating, diarrhea or blood in the stool.  GU: Pt reports urinary frequency. Denies urgency, pain with urination, burning sensation, blood in urine, odor or discharge. Musculoskeletal: Pt reports joint pain. Denies decrease in range of motion, difficulty with gait, muscle pain or joint swelling.  Skin: Denies redness, rashes, lesions or ulcercations.  Neurological: Pt reports insomnia. Denies dizziness, difficulty with memory, difficulty with speech or problems with balance and coordination.  Psych: Pt has a history of depression. Denies anxiety, SI/HI.  No other specific complaints in a complete review of systems (except as listed in HPI above).   Objective:  BP (!) 145/90 (BP Location: Right Arm, Patient Position: Sitting, Cuff Size: Large)    Pulse (!) 111    Temp (!) 97.3 F (36.3 C) (Temporal)    Resp 18    Ht 5' 9.5" (1.765 m)    Wt 197 lb (89.4 kg)    SpO2 100%    BMI 28.67 kg/m   Wt Readings from Last 3 Encounters:  03/09/21 206 lb (93.4 kg)  08/13/20 219 lb (99.3 kg)  08/05/20 205 lb (93 kg)    General: Appears his stated age, overweight,  in NAD. Skin: Warm, dry and intact.  HEENT: Head: normal shape and size; Eyes: sclera white and EOMs intact;  Neck:  Neck supple, trachea midline. No masses, lumps or thyromegaly present.  Cardiovascular: Tachycardic with normal rhythm. S1,S2 noted.  No murmur, rubs or gallops noted. No JVD or BLE edema. No carotid bruits noted. Pulmonary/Chest: Normal effort and positive vesicular breath sounds. No respiratory distress. No wheezes,  rales or ronchi noted.  Abdomen: Soft and nontender. Normal bowel sounds.  Musculoskeletal: Strength 5/5 BUE/BLE. No difficulty with gait.  Neurological: Alert and oriented. Cranial nerves II-XII grossly intact. Coordination normal.  Psychiatric: Mood and affect normal. Behavior is normal. Judgment and thought content normal.   BMET    Component Value Date/Time   NA 140 03/09/2021 1119   NA 140 04/20/2017 0925   K 4.2 03/09/2021 1119   CL 105 03/09/2021 1119   CO2 27 03/09/2021  1119   GLUCOSE 87 03/09/2021 1119   BUN 9 03/09/2021 1119   BUN 9 04/20/2017 0925   CREATININE 0.80 03/09/2021 1119   CALCIUM 9.6 03/09/2021 1119   GFRNONAA 85 03/09/2021 1119   GFRAA 98 03/09/2021 1119    Lipid Panel     Component Value Date/Time   CHOL 124 03/09/2021 1119   CHOL 113 04/20/2017 0925   TRIG 117 03/09/2021 1119   HDL 37 (L) 03/09/2021 1119   HDL 26 (L) 04/20/2017 0925   CHOLHDL 3.4 03/09/2021 1119   VLDL 24 08/17/2016 0001   LDLCALC 67 03/09/2021 1119    CBC    Component Value Date/Time   WBC 7.0 03/09/2021 1119   RBC 6.41 (H) 03/09/2021 1119   HGB 13.9 03/09/2021 1119   HGB 13.0 04/22/2015 0818   HCT 46.3 03/09/2021 1119   HCT 38.5 04/22/2015 0818   PLT 254 03/09/2021 1119   PLT 257 04/22/2015 0818   MCV 72.2 (L) 03/09/2021 1119   MCV 69 (L) 04/22/2015 0818   MCH 21.7 (L) 03/09/2021 1119   MCHC 30.0 (L) 03/09/2021 1119   RDW 17.6 (H) 03/09/2021 1119   RDW 16.0 (H) 04/22/2015 0818   LYMPHSABS 1,757 03/09/2021 1119   LYMPHSABS 2.4 04/22/2015 0818   MONOABS 630 08/17/2016 0001   EOSABS 119 03/09/2021 1119   EOSABS 0.2 04/22/2015 0818   BASOSABS 42 03/09/2021 1119   BASOSABS 0.0 04/22/2015 0818    Hgb A1C Lab Results  Component Value Date   HGBA1C 5.6 03/09/2021           Assessment & Plan:   Preventative Health Maintenance:  Flu shot UTD He declines tetanus for financial reasons Covid vaccine UTD Pneumovax and prevnar UTD Zostovax UTD Advised him  if he want to get the Shingrix vaccine, to go to the pharmacy and get this Colon screening UTD Encouraged him to consume a balanced diet and exercise regimen Advised him to see an eye doctor and dentist annually Will check CBC, CMET, TSH, Free T4, Lipid, A1C and PSA today  RTC in 6 months, follow up chronic conditions Webb Silversmith, NP This visit occurred during the SARS-CoV-2 public health emergency.  Safety protocols were in place, including screening questions prior to the visit, additional usage of staff PPE, and extensive cleaning of exam room while observing appropriate contact time as indicated for disinfecting solutions.

## 2021-08-26 LAB — CBC
HCT: 47.5 % (ref 38.5–50.0)
Hemoglobin: 14.9 g/dL (ref 13.2–17.1)
MCH: 22.3 pg — ABNORMAL LOW (ref 27.0–33.0)
MCHC: 31.4 g/dL — ABNORMAL LOW (ref 32.0–36.0)
MCV: 71 fL — ABNORMAL LOW (ref 80.0–100.0)
MPV: 10.2 fL (ref 7.5–12.5)
Platelets: 229 10*3/uL (ref 140–400)
RBC: 6.69 10*6/uL — ABNORMAL HIGH (ref 4.20–5.80)
RDW: 17.5 % — ABNORMAL HIGH (ref 11.0–15.0)
WBC: 7.3 10*3/uL (ref 3.8–10.8)

## 2021-08-26 LAB — COMPLETE METABOLIC PANEL WITH GFR
AG Ratio: 1.8 (calc) (ref 1.0–2.5)
ALT: 14 U/L (ref 9–46)
AST: 21 U/L (ref 10–35)
Albumin: 4.4 g/dL (ref 3.6–5.1)
Alkaline phosphatase (APISO): 40 U/L (ref 35–144)
BUN: 10 mg/dL (ref 7–25)
CO2: 27 mmol/L (ref 20–32)
Calcium: 10 mg/dL (ref 8.6–10.3)
Chloride: 104 mmol/L (ref 98–110)
Creat: 0.95 mg/dL (ref 0.70–1.22)
Globulin: 2.5 g/dL (calc) (ref 1.9–3.7)
Glucose, Bld: 86 mg/dL (ref 65–99)
Potassium: 4.3 mmol/L (ref 3.5–5.3)
Sodium: 140 mmol/L (ref 135–146)
Total Bilirubin: 0.6 mg/dL (ref 0.2–1.2)
Total Protein: 6.9 g/dL (ref 6.1–8.1)
eGFR: 81 mL/min/{1.73_m2} (ref >=60)

## 2021-08-26 LAB — HEMOGLOBIN A1C
Hgb A1c MFr Bld: 5.4 % of total Hgb (ref <5.7)
Mean Plasma Glucose: 108 mg/dL
eAG (mmol/L): 6 mmol/L

## 2021-08-26 LAB — PSA: PSA: 1.68 ng/mL (ref <=4.00)

## 2021-08-26 LAB — LIPID PANEL
Cholesterol: 146 mg/dL (ref <200)
HDL: 46 mg/dL (ref >=40)
LDL Cholesterol (Calc): 82 mg/dL (calc)
Non-HDL Cholesterol (Calc): 100 mg/dL (calc) (ref <130)
Total CHOL/HDL Ratio: 3.2 (calc) (ref <5.0)
Triglycerides: 85 mg/dL (ref <150)

## 2021-08-26 LAB — TSH+FREE T4: TSH W/REFLEX TO FT4: 2.85 mIU/L (ref 0.40–4.50)

## 2021-08-26 NOTE — Assessment & Plan Note (Signed)
Encouraged diet and exercise for weight loss ?

## 2021-08-26 NOTE — Patient Instructions (Signed)
Health Maintenance After Age 80 After age 80, you are at a higher risk for certain long-term diseases and infections as well as injuries from falls. Falls are a major cause of broken bones and head injuries in people who are older than age 80. Getting regular preventive care can help to keep you healthy and well. Preventive care includes getting regular testing and making lifestyle changes as recommended by your health care provider. Talk with your health care provider about: Which screenings and tests you should have. A screening is a test that checks for a disease when you have no symptoms. A diet and exercise plan that is right for you. What should I know about screenings and tests to prevent falls? Screening and testing are the best ways to find a health problem early. Early diagnosis and treatment give you the best chance of managing medical conditions that are common after age 80. Certain conditions and lifestyle choices may make you more likely to have a fall. Your health care provider may recommend: Regular vision checks. Poor vision and conditions such as cataracts can make you more likely to have a fall. If you wear glasses, make sure to get your prescription updated if your vision changes. Medicine review. Work with your health care provider to regularly review all of the medicines you are taking, including over-the-counter medicines. Ask your health care provider about any side effects that may make you more likely to have a fall. Tell your health care provider if any medicines that you take make you feel dizzy or sleepy. Strength and balance checks. Your health care provider may recommend certain tests to check your strength and balance while standing, walking, or changing positions. Foot health exam. Foot pain and numbness, as well as not wearing proper footwear, can make you more likely to have a fall. Screenings, including: Osteoporosis screening. Osteoporosis is a condition that causes  the bones to get weaker and break more easily. Blood pressure screening. Blood pressure changes and medicines to control blood pressure can make you feel dizzy. Depression screening. You may be more likely to have a fall if you have a fear of falling, feel depressed, or feel unable to do activities that you used to do. Alcohol use screening. Using too much alcohol can affect your balance and may make you more likely to have a fall. Follow these instructions at home: Lifestyle Do not drink alcohol if: Your health care provider tells you not to drink. If you drink alcohol: Limit how much you have to: 0-1 drink a day for women. 0-2 drinks a day for men. Know how much alcohol is in your drink. In the [place] one drink equals one 12 oz bottle of beer (355 mL), one 5 oz glass of wine (148 mL), or one 1 oz glass of hard liquor (44 mL). Do not use any products that contain nicotine or tobacco. These products include cigarettes, chewing tobacco, and vaping devices, such as e-cigarettes. If you need help quitting, ask your health care provider. Activity  Follow a regular exercise program to stay fit. This will help you maintain your balance. Ask your health care provider what types of exercise are appropriate for you. If you need a cane or walker, use it as recommended by your health care provider. Wear supportive shoes that have nonskid soles. Safety  Remove any tripping hazards, such as rugs, cords, and clutter. Install safety equipment such as grab bars in bathrooms and safety rails on stairs. Keep rooms and walkways   well-lit. General instructions Talk with your health care provider about your risks for falling. Tell your health care provider if: You fall. Be sure to tell your health care provider about all falls, even ones that seem minor. You feel dizzy, tiredness (fatigue), or off-balance. Take over-the-counter and prescription medicines only as told by your health care provider. These include  supplements. Eat a healthy diet and maintain a healthy weight. A healthy diet includes low-fat dairy products, low-fat (lean) meats, and fiber from whole grains, beans, and lots of fruits and vegetables. Stay current with your vaccines. Schedule regular health, dental, and eye exams. Summary Having a healthy lifestyle and getting preventive care can help to protect your health and wellness after age 80. Screening and testing are the best way to find a health problem early and help you avoid having a fall. Early diagnosis and treatment give you the best chance for managing medical conditions that are more common for people who are older than age 80. Falls are a major cause of broken bones and head injuries in people who are older than age 80. Take precautions to prevent a fall at home. Work with your health care provider to learn what changes you can make to improve your health and wellness and to prevent falls. This information is not intended to replace advice given to you by your health care provider. Make sure you discuss any questions you have with your health care provider. Document Revised: 01/19/2021 Document Reviewed: 01/19/2021 [employer] Patient Education  2022 [employer]  

## 2021-09-16 ENCOUNTER — Encounter: Payer: Medicare Other | Admitting: Internal Medicine

## 2021-10-09 ENCOUNTER — Other Ambulatory Visit: Payer: Self-pay

## 2021-10-09 ENCOUNTER — Ambulatory Visit (INDEPENDENT_AMBULATORY_CARE_PROVIDER_SITE_OTHER): Payer: Medicare Other

## 2021-10-09 VITALS — BP 132/80 | HR 102 | Temp 98.2°F | Ht 70.0 in | Wt 194.4 lb

## 2021-10-09 DIAGNOSIS — Z0000 Encounter for general adult medical examination without abnormal findings: Secondary | ICD-10-CM | POA: Diagnosis not present

## 2021-10-09 NOTE — Progress Notes (Signed)
Subjective:   Joe Hardy is a 81 y.o. male who presents for Medicare Annual/Subsequent preventive examination.  Review of Systems           Objective:    Today's Vitals   10/09/21 1105  BP: 132/80  Pulse: (!) 102  Temp: 98.2 F (36.8 C)  TempSrc: Oral  SpO2: 97%  Weight: 194 lb 6.4 oz (88.2 kg)  Height: 5\' 10"  (1.778 m)   Body mass index is 27.89 kg/m.  Advanced Directives 08/05/2020 05/08/2019 05/02/2018 04/05/2017 05/13/2016 04/14/2015 04/14/2015  Does Patient Have a Medical Advance Directive? No No Yes No No No Yes  Type of Advance Directive - - Living will;Healthcare Power of Attorney - - - -  Does patient want to make changes to medical advance directive? - - - Yes (MAU/Ambulatory/Procedural Areas - Information given) - - -  Copy of Joe Hardy in Chart? - - No - copy requested - - - -  Would patient like information on creating a medical advance directive? - - - - Yes - Educational materials given Yes - Scientist, clinical (histocompatibility and immunogenetics) given -    Current Medications (verified) Outpatient Encounter Medications as of 10/09/2021  Medication Sig   Ascorbic Acid (VITAMIN C) 1000 MG tablet Take 1,000 mg by mouth daily.   atorvastatin (LIPITOR) 20 MG tablet TAKE 1 TABLET BY MOUTH  DAILY AT 6PM   b complex vitamins tablet Take 1 tablet by mouth daily.   Blood Pressure Monitoring (SM WRIST CUFF BP MONITOR) MISC 1 Units by Does not apply route 2 (two) times a week.   buPROPion (WELLBUTRIN XL) 150 MG 24 hr tablet Take 450 mg by mouth daily.   cholecalciferol (VITAMIN D3) 25 MCG (1000 UT) tablet Take 4,000 Units by mouth daily.    cyanocobalamin 1000 MCG tablet Take 1,000 mcg by mouth daily.   ferrous sulfate 325 (65 FE) MG tablet Take 325 mg by mouth daily with breakfast.   Flaxseed, Linseed, (FLAX SEEDS PO) Take by mouth.   GARLIC PO Take by mouth.   levothyroxine (SYNTHROID) 75 MCG tablet Take 1 tablet (75 mcg total) by mouth daily before breakfast.    lisinopril-hydrochlorothiazide (ZESTORETIC) 10-12.5 MG tablet Take 1 tablet by mouth daily.   magnesium 30 MG tablet Take 400 mg by mouth daily.   mirtazapine (REMERON) 30 MG tablet Take 30 mg by mouth at bedtime.   Multiple Vitamin (MULTIVITAMIN WITH MINERALS) TABS tablet Take 1 tablet by mouth daily.   Saw Palmetto 450 MG CAPS Take 450 mg by mouth daily.   terazosin (HYTRIN) 5 MG capsule Take 1 capsule (5 mg total) by mouth daily.   traZODone (DESYREL) 150 MG tablet Take 150 mg by mouth at bedtime.   TURMERIC PO Take 500 mg by mouth 2 (two) times daily.   No facility-administered encounter medications on file as of 10/09/2021.    Allergies (verified) Fluoxetine and Venlafaxine   History: Past Medical History:  Diagnosis Date   Depression    GERD (gastroesophageal reflux disease)    Hyperlipidemia    Hypertension    Migraine    Sleep apnea    Past Surgical History:  Procedure Laterality Date   oral surgery     TONSILLECTOMY     Family History  Problem Relation Age of Onset   Tuberculosis Mother    Asthma Mother    Emphysema Father    Heart attack Father    Social History   Socioeconomic History   Marital status:  Divorced    Spouse name: Not on file   Number of children: Not on file   Years of education: Not on file   Highest education level: Bachelor's degree (e.g., BA, AB, BS)  Occupational History   Occupation: retired  Tobacco Use   Smoking status: Former    Packs/day: 1.50    Years: 20.00    Pack years: 30.00    Types: Cigarettes    Quit date: 05/02/2017    Years since quitting: 4.4   Smokeless tobacco: Former   Tobacco comments:    Starts and stops related to anxiety rather than drinking alcohol  Vaping Use   Vaping Use: Never used  Substance and Sexual Activity   Alcohol use: No    Alcohol/week: 0.0 standard drinks    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)   Drug use: No   Sexual activity: Not Currently  Other Topics Concern   Not on  file  Social History Narrative   Not on file   Social Determinants of Health   Financial Resource Strain: Not on file  Food Insecurity: Not on file  Transportation Needs: Not on file  Physical Activity: Not on file  Stress: Not on file  Social Connections: Not on file    Tobacco Counseling Counseling given: Not Answered Tobacco comments: Starts and stops related to anxiety rather than drinking alcohol   Clinical Intake:  Pre-visit preparation completed: Yes  Pain : No/denies pain     Nutritional Status: BMI 25 -29 Overweight Nutritional Risks: None Diabetes: No  How often do you need to have someone help you when you read instructions, pamphlets, or other written materials from your doctor or pharmacy?: 1 - Never  Diabetic?no  Interpreter Needed?: No  Information entered by :: Kirke Shaggy, LPN   Activities of Daily Living In your present state of health, do you have any difficulty performing the following activities: 03/09/2021  Hearing? Y  Vision? N  Difficulty concentrating or making decisions? N  Walking or climbing stairs? Y  Dressing or bathing? N  Doing errands, shopping? N  Some recent data might be hidden    Patient Care Team: Jearld Fenton, NP as PCP - General (Internal Medicine) Myer Haff, MD as Referring Physician (Psychiatry) Corrie Dandy, LCSW as Social Worker (Psychiatry)  Indicate any recent Medical Services you may have received from other than [facility] providers in the past year (date may be approximate).     Assessment:   This is a routine wellness examination for Joe Hardy.  Hearing/Vision screen No results found.  Dietary issues and exercise activities discussed:     Goals Addressed   None    Depression Screen PHQ 2/9 Scores 03/09/2021 08/05/2020 07/27/2019 05/08/2019 01/30/2019 12/15/2018 08/08/2018  PHQ - 2 Score 0 0 0 0 - 0 0  PHQ- 9 Score 2 0 2 - - - 2  Exception Documentation - - - - Patient refusal - -    Fall  Risk Fall Risk  03/09/2021 08/05/2020 05/08/2019 01/30/2019 12/15/2018  Falls in the past year? 0 0 0 0 0  Number falls in past yr: 0 - - - -  Injury with Fall? 0 - - - -  Risk for fall due to : No Fall Risks Impaired balance/gait;Medication side effect - - -  Follow up Falls evaluation completed Falls evaluation completed;Education provided;Falls prevention discussed - Falls evaluation completed Falls evaluation completed    FALL RISK PREVENTION PERTAINING TO THE HOME:  Any stairs in or  around the home? No  If so, are there any without handrails? No  Home free of loose throw rugs in walkways, pet beds, electrical cords, etc? Yes  Adequate lighting in your home to reduce risk of falls? Yes   ASSISTIVE DEVICES UTILIZED TO PREVENT FALLS:  Life alert? No  Use of a cane, walker or w/c? No  Grab bars in the bathroom? No  Shower chair or bench in shower? No  Elevated toilet seat or a handicapped toilet? No   TIMED UP AND GO:  Was the test performed? Yes .  Length of time to ambulate 10 feet: 4 sec.   Gait steady and fast without use of assistive device  Cognitive Function: MMSE - Mini Mental State Exam 05/13/2016 04/14/2015  Orientation to time 5 5  Orientation to Place 5 5  Registration 3 3  Attention/ Calculation 5 5  Recall 3 3  Language- name 2 objects 2 2  Language- repeat 1 1  Language- follow 3 step command 3 3  Language- read & follow direction 1 1  Write a sentence 1 1  Copy design 1 1  Total score 30 30     6CIT Screen 08/05/2020 05/08/2019 05/02/2018 04/05/2017  What Year? 0 points 0 points 0 points 0 points  What month? 0 points 0 points 0 points 0 points  What time? 0 points 0 points 0 points 0 points  Count back from 20 0 points 0 points 0 points 0 points  Months in reverse 0 points 0 points 0 points 0 points  Repeat phrase 2 points 0 points 2 points 2 points  Total Score 2 0 2 2    Immunizations Immunization History  Administered Date(s) Administered   Fluad  Quad(high Dose 65+) 07/27/2019   Influenza, High Dose Seasonal PF 07/18/2015, 06/23/2016, 07/11/2017, 08/08/2018   Influenza-Unspecified 06/13/2014, 07/28/2020, 06/16/2021   PFIZER(Purple Top)SARS-COV-2 Vaccination 09/20/2019, 10/13/2019, 07/28/2020, 01/16/2021, 06/16/2021   Pneumococcal Conjugate-13 03/13/2014   Pneumococcal Polysaccharide-23 09/13/2012   Zoster, Live 09/13/2012    TDAP status: Due, Education has been provided regarding the importance of this vaccine. Advised may receive this vaccine at local pharmacy or Health Dept. Aware to provide a copy of the vaccination record if obtained from local pharmacy or Health Dept. Verbalized acceptance and understanding.  Flu Vaccine status: Up to date  Pneumococcal vaccine status: Up to date  Covid-19 vaccine status: Completed vaccines  Qualifies for Shingles Vaccine? Yes   Zostavax completed No   Shingrix Completed?: No.    Education has been provided regarding the importance of this vaccine. Patient has been advised to call insurance company to determine out of pocket expense if they have not yet received this vaccine. Advised may also receive vaccine at local pharmacy or Health Dept. Verbalized acceptance and understanding.  Screening Tests Health Maintenance  Topic Date Due   Zoster Vaccines- Shingrix (1 of 2) 11/24/2021 (Originally 06/12/1991)   TETANUS/TDAP  04/16/2025   Pneumonia Vaccine 31+ Years old  Completed   INFLUENZA VACCINE  Completed   COVID-19 Vaccine  Completed   HPV VACCINES  Aged Out    Health Maintenance  There are no preventive care reminders to display for this patient.  Colorectal cancer screening: Type of screening: Colonoscopy. Completed 09/13/14. Repeat every aged out years  Lung Cancer Screening: (Low Dose CT Chest recommended if Age 81-80 years, 30 pack-year currently smoking OR have quit w/in 15years.) does not qualify.    Additional Screening:  Hepatitis C Screening: does not  qualify; Completed  no  Vision Screening: Recommended annual ophthalmology exams for early detection of glaucoma and other disorders of the eye. Is the patient up to date with their annual eye exam?  Yes  Who is the provider or what is the name of the office in which the patient attends annual eye exams? [facility] If pt is not established with a provider, would they like to be referred to a provider to establish care? No .   Dental Screening: Recommended annual dental exams for proper oral hygiene  Community Resource Referral / Chronic Care Management: CRR required this visit?  No   CCM required this visit?  No      Plan:     I have personally reviewed and noted the following in the patients chart:   Medical and social history Use of alcohol, tobacco or illicit drugs  Current medications and supplements including opioid prescriptions. Patient is not currently taking opioid prescriptions. Functional ability and status Nutritional status Physical activity Advanced directives List of other physicians Hospitalizations, surgeries, and ER visits in previous 12 months Vitals Screenings to include cognitive, depression, and falls Referrals and appointments  In addition, I have reviewed and discussed with patient certain preventive protocols, quality metrics, and best practice recommendations. A written personalized care plan for preventive services as well as general preventive health recommendations were provided to patient.     Dionisio David, LPN   [id number]   Nurse Notes: none

## 2021-10-23 ENCOUNTER — Ambulatory Visit: Payer: Medicare Other

## 2021-11-19 ENCOUNTER — Other Ambulatory Visit: Payer: Self-pay | Admitting: Family Medicine

## 2021-11-19 DIAGNOSIS — N138 Other obstructive and reflux uropathy: Secondary | ICD-10-CM

## 2021-11-19 DIAGNOSIS — E034 Atrophy of thyroid (acquired): Secondary | ICD-10-CM

## 2021-11-19 NOTE — Telephone Encounter (Signed)
Requested Prescriptions  ?Pending Prescriptions Disp Refills  ?? terazosin (HYTRIN) 5 MG capsule [Pharmacy Med Name: Terazosin HCl 5 MG Oral Capsule] 90 capsule 3  ?  Sig: TAKE 1 CAPSULE BY MOUTH  DAILY  ?  ? Cardiovascular:  Alpha Blockers Passed - 11/19/2021  5:37 AM  ?  ?  Passed - Last BP in normal range  ?  BP Readings from Last 1 Encounters:  ?10/09/21 132/80  ?   ?  ?  Passed - Valid encounter within last 6 months  ?  Recent Outpatient Visits   ?      ? 8 months ago Primary osteoarthritis involving multiple joints  ? [facility], [place], NP  ? 9 months ago COVID-19  ? [facility], Coralie Keens, NP  ? 1 year ago Essential hypertension  ? [place], FNP  ? 1 year ago Pre-diabetes  ? [facility], Lupita Raider, FNP  ? 2 years ago Annual physical exam  ? [place], DO  ?  ?  ?Future Appointments   ?        ? In 3 months Baity, Coralie Keens, NP [facility] [place]  ?  ? ?  ?  ?  ?? levothyroxine (SYNTHROID) 75 MCG tablet [Pharmacy Med Name: Levothyroxine Sodium 75 MCG Oral Tablet] 90 tablet 3  ?  Sig: TAKE 1 TABLET BY MOUTH  DAILY BEFORE BREAKFAST  ?  ? Endocrinology:  Hypothyroid Agents Passed - 11/19/2021  5:37 AM  ?  ?  Passed - TSH in normal range and within 360 days  ?  TSH  ?Date Value Ref Range Status  ?03/09/2021 1.31 0.40 - 4.50 mIU/L Final  ?   ?  ?  Passed - Valid encounter within last 12 months  ?  Recent Outpatient Visits   ?      ? 8 months ago Primary osteoarthritis involving multiple joints  ? [facility] [place], [place] W, NP  ? 9 months ago COVID-19  ? [facility], Coralie Keens, NP  ? 1 year ago Essential hypertension  ? [place], FNP  ? 1 year ago Pre-diabetes  ? [facility], Lupita Raider, FNP  ? 2 years ago Annual physical exam  ? [place], DO  ?  ?  ?Future Appointments   ?        ? In 3 months Baity, Coralie Keens, NP [facility], [place]  ?  ? ?  ?  ?  ? ? ?

## 2022-02-24 ENCOUNTER — Ambulatory Visit: Payer: Medicare Other | Admitting: Internal Medicine

## 2022-03-03 ENCOUNTER — Encounter: Payer: Self-pay | Admitting: Internal Medicine

## 2022-03-03 ENCOUNTER — Ambulatory Visit (INDEPENDENT_AMBULATORY_CARE_PROVIDER_SITE_OTHER): Payer: Medicare Other | Admitting: Internal Medicine

## 2022-03-03 VITALS — BP 140/80 | HR 95 | Temp 97.1°F | Wt 176.0 lb

## 2022-03-03 DIAGNOSIS — F3341 Major depressive disorder, recurrent, in partial remission: Secondary | ICD-10-CM

## 2022-03-03 DIAGNOSIS — N138 Other obstructive and reflux uropathy: Secondary | ICD-10-CM | POA: Diagnosis not present

## 2022-03-03 DIAGNOSIS — E782 Mixed hyperlipidemia: Secondary | ICD-10-CM | POA: Diagnosis not present

## 2022-03-03 DIAGNOSIS — K5901 Slow transit constipation: Secondary | ICD-10-CM

## 2022-03-03 DIAGNOSIS — G43C1 Periodic headache syndromes in child or adult, intractable: Secondary | ICD-10-CM

## 2022-03-03 DIAGNOSIS — Z6825 Body mass index (BMI) 25.0-25.9, adult: Secondary | ICD-10-CM

## 2022-03-03 DIAGNOSIS — I10 Essential (primary) hypertension: Secondary | ICD-10-CM

## 2022-03-03 DIAGNOSIS — M159 Polyosteoarthritis, unspecified: Secondary | ICD-10-CM | POA: Diagnosis not present

## 2022-03-03 DIAGNOSIS — E039 Hypothyroidism, unspecified: Secondary | ICD-10-CM | POA: Diagnosis not present

## 2022-03-03 DIAGNOSIS — N401 Enlarged prostate with lower urinary tract symptoms: Secondary | ICD-10-CM

## 2022-03-03 DIAGNOSIS — F5104 Psychophysiologic insomnia: Secondary | ICD-10-CM

## 2022-03-03 DIAGNOSIS — E663 Overweight: Secondary | ICD-10-CM

## 2022-03-03 NOTE — Assessment & Plan Note (Signed)
C-Met and lipid profile today Encouraged him to consume a low-fat diet Continue atorvastatin 

## 2022-03-03 NOTE — Assessment & Plan Note (Signed)
TSH and free T4 today We will adjust levothyroxine if needed based on labs 

## 2022-03-03 NOTE — Progress Notes (Signed)
Subjective:    Patient ID: Joe Hardy, male    DOB: 08/03/1941, 81 y.o.   MRN: [id number]  HPI  Patient presents to clinic today for 40-monthfollow-up of chronic conditions.  HTN: His BP today is 144/88.  He is taking Lisinopril HCT as prescribed.  ECG from 06/2009 reviewed.  Migraines: This is currently not an issue.  He is not taking any medications for this.  He does not follow with neurology.  Hypothyroidism: He denies any issues on his current dose of Levothyroxine.  He does not follow with endocrinology.  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  He takes Turmeric OTC with some relief of symptoms.  He does not follow with orthopedics.  Chronic Constipation: Managed with Magnesium OTC.  He does not follow with GI.  BPH: Mainly urinary urgency.  He is taking Saw Palmetto and Terazosin as prescribed.  He does not follow with urology.  HLD: His last LDL was 82, triglycerides 85, 08/2021.  He denies myalgias on Atorvastatin.  He tries to consume a low-fat diet.  Anxiety and Depression: Chronic, managed on Mirtazapine, Buspirone and Wellbutrin.  He is currently seeing a therapist and a psychiatrist.  He denies SI/HI.  Insomnia: He has difficulty falling and staying asleep.  He is taking Trazodone as prescribed.  There is no sleep study on file.   Review of Systems     Past Medical History:  Diagnosis Date   Depression    GERD (gastroesophageal reflux disease)    Hyperlipidemia    Hypertension    Migraine    Sleep apnea     Current Outpatient Medications  Medication Sig Dispense Refill   Ascorbic Acid (VITAMIN C) 1000 MG tablet Take 1,000 mg by mouth daily.     atorvastatin (LIPITOR) 20 MG tablet TAKE 1 TABLET BY MOUTH  DAILY AT 6PM 90 tablet 3   b complex vitamins tablet Take 1 tablet by mouth daily.     Blood Pressure Monitoring (SM WRIST CUFF BP MONITOR) MISC 1 Units by Does not apply route 2 (two) times a week. 1 each 0   buPROPion (WELLBUTRIN XL) 150 MG 24 hr tablet Take 450  mg by mouth daily.     busPIRone (BUSPAR) 10 MG tablet Take 10 mg by mouth 2 (two) times daily.     cholecalciferol (VITAMIN D3) 25 MCG (1000 UT) tablet Take 4,000 Units by mouth daily.      cyanocobalamin 1000 MCG tablet Take 1,000 mcg by mouth daily.     ferrous sulfate 325 (65 FE) MG tablet Take 325 mg by mouth daily with breakfast. (Patient not taking: Reported on 10/09/2021)     Flaxseed, Linseed, (FLAX SEEDS PO) Take by mouth.     FLUZONE HIGH-DOSE QUADRIVALENT 0.7 ML SUSY      GARLIC PO Take by mouth. (Patient not taking: Reported on 10/09/2021)     levothyroxine (SYNTHROID) 75 MCG tablet TAKE 1 TABLET BY MOUTH  DAILY BEFORE BREAKFAST 90 tablet 3   lisinopril-hydrochlorothiazide (ZESTORETIC) 10-12.5 MG tablet Take 1 tablet by mouth daily. 90 tablet 3   magnesium 30 MG tablet Take 400 mg by mouth daily.     mirtazapine (REMERON) 30 MG tablet Take 30 mg by mouth at bedtime.     Multiple Vitamin (MULTIVITAMIN WITH MINERALS) TABS tablet Take 1 tablet by mouth daily.     PFIZER COVID-19 VAC BIVALENT injection      Saw Palmetto 450 MG CAPS Take 450 mg by mouth daily.  terazosin (HYTRIN) 5 MG capsule TAKE 1 CAPSULE BY MOUTH  DAILY 90 capsule 3   traZODone (DESYREL) 150 MG tablet Take 150 mg by mouth at bedtime.     TURMERIC PO Take 500 mg by mouth 2 (two) times daily.     No current facility-administered medications for this visit.    Allergies  Allergen Reactions   Fluoxetine     insomnia   Venlafaxine     difficulty sleeping    Family History  Problem Relation Age of Onset   Tuberculosis Mother    Asthma Mother    Emphysema Father    Heart attack Father     Social History   Socioeconomic History   Marital status: Divorced    Spouse name: Not on file   Number of children: Not on file   Years of education: Not on file   Highest education level: Bachelor's degree (e.g., BA, AB, BS)  Occupational History   Occupation: retired  Tobacco Use   Smoking status: Former     Packs/day: 1.50    Years: 20.00    Total pack years: 30.00    Types: Cigarettes    Quit date: 05/02/2017    Years since quitting: 4.8   Smokeless tobacco: Former   Tobacco comments:    Starts and stops related to anxiety rather than drinking alcohol  Vaping Use   Vaping Use: Never used  Substance and Sexual Activity   Alcohol use: No    Alcohol/week: 0.0 standard drinks of alcohol    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)   Drug use: No   Sexual activity: Not Currently  Other Topics Concern   Not on file  Social History Narrative   Not on file   Social Determinants of Health   Financial Resource Strain: Low Risk  (10/09/2021)   Overall Financial Resource Strain (CARDIA)    Difficulty of Paying Living Expenses: Not hard at all  Food Insecurity: No Food Insecurity (10/09/2021)   Hunger Vital Sign    Worried About Running Out of Food in the Last Year: Never true    [place] in the Last Year: Never true  Transportation Needs: No Transportation Needs (10/09/2021)   PRAPARE - Hydrologist (Medical): No    Lack of Transportation (Non-Medical): No  Physical Activity: Inactive (10/09/2021)   Exercise Vital Sign    Days of Exercise per Week: 0 days    Minutes of Exercise per Session: 0 min  Stress: No Stress Concern Present (10/09/2021)   [place]    Feeling of Stress : Not at all  Social Connections: Moderately Isolated (10/09/2021)   Social Connection and Isolation Panel [NHANES]    Frequency of Communication with Friends and Family: More than three times a week    Frequency of Social Gatherings with Friends and Family: More than three times a week    Attends Religious Services: Never    Marine scientist or Organizations: Yes    Attends Archivist Meetings: Never    Marital Status: Divorced  Human resources officer Violence: Not At Risk (10/09/2021)   Humiliation,  Afraid, Rape, and Kick questionnaire    Fear of Current or Ex-Partner: No    Emotionally Abused: No    Physically Abused: No    Sexually Abused: No     Constitutional: Denies fever, malaise, fatigue, headache or abrupt weight changes.  HEENT: Denies eye  pain, eye redness, ear pain, ringing in the ears, wax buildup, runny nose, nasal congestion, bloody nose, or sore throat. Respiratory: Denies difficulty breathing, shortness of breath, cough or sputum production.   Cardiovascular: Pt reports palpitations. Denies chest pain, chest tightness, or swelling in the hands or feet.  Gastrointestinal: Patient reports chronic constipation.  Denies abdominal pain, bloating, diarrhea or blood in the stool.  GU: Patient reports urinary urgency.  Denies frequency, pain with urination, burning sensation, blood in urine, odor or discharge. Musculoskeletal: Patient reports bilateral knee pain.  Denies decrease in range of motion, difficulty with gait, muscle pain or joint swelling.  Skin: Denies redness, rashes, lesions or ulcercations.  Neurological: Patient reports insomnia.  Denies dizziness, difficulty with memory, difficulty with speech or problems with balance and coordination.  Psych: Patient has a history of anxiety and depression.  Denies SI/HI.  No other specific complaints in a complete review of systems (except as listed in HPI above).  Objective:   Physical Exam  BP 140/80   Pulse 95   Temp (!) 97.1 F (36.2 C) (Temporal)   Wt 176 lb (79.8 kg)   SpO2 99%   BMI 25.25 kg/m   Wt Readings from Last 3 Encounters:  10/09/21 194 lb 6.4 oz (88.2 kg)  08/25/21 197 lb (89.4 kg)  03/09/21 206 lb (93.4 kg)    General: Appears his stated age, overweight, in NAD. Skin: Warm, dry and intact. HEENT: Head: normal shape and size; Eyes: sclera white, no icterus, conjunctiva pink, PERRLA and EOMs intact;  Neck:  Neck supple, trachea midline. No masses, lumps or thyromegaly present.  Cardiovascular:  Normal rate and rhythm. S1,S2 noted.  No murmur, rubs or gallops noted. No JVD or BLE edema. No carotid bruits noted. Pulmonary/Chest: Normal effort and positive vesicular breath sounds. No respiratory distress. No wheezes, rales or ronchi noted.  Abdomen: Soft and nontender. Normal bowel sounds.  Musculoskeletal: Strength 5/5 BUE/BLE. No difficulty with gait.  Neurological: Alert and oriented. Cranial nerves II-XII grossly intact. Coordination normal.  Psychiatric: Mood and affect normal. Behavior is normal. Judgment and thought content normal.   BMET    Component Value Date/Time   NA 140 08/25/2021 1505   NA 140 04/20/2017 0925   K 4.3 08/25/2021 1505   CL 104 08/25/2021 1505   CO2 27 08/25/2021 1505   GLUCOSE 86 08/25/2021 1505   BUN 10 08/25/2021 1505   BUN 9 04/20/2017 0925   CREATININE 0.95 08/25/2021 1505   CALCIUM 10.0 08/25/2021 1505   GFRNONAA 85 03/09/2021 1119   GFRAA 98 03/09/2021 1119    Lipid Panel     Component Value Date/Time   CHOL 146 08/25/2021 1505   CHOL 113 04/20/2017 0925   TRIG 85 08/25/2021 1505   HDL 46 08/25/2021 1505   HDL 26 (L) 04/20/2017 0925   CHOLHDL 3.2 08/25/2021 1505   VLDL 24 08/17/2016 0001   LDLCALC 82 08/25/2021 1505    CBC    Component Value Date/Time   WBC 7.3 08/25/2021 1505   RBC 6.69 (H) 08/25/2021 1505   HGB 14.9 08/25/2021 1505   HGB 13.0 04/22/2015 0818   HCT 47.5 08/25/2021 1505   HCT 38.5 04/22/2015 0818   PLT 229 08/25/2021 1505   PLT 257 04/22/2015 0818   MCV 71.0 (L) 08/25/2021 1505   MCV 69 (L) 04/22/2015 0818   MCH 22.3 (L) 08/25/2021 1505   MCHC 31.4 (L) 08/25/2021 1505   RDW 17.5 (H) 08/25/2021 1505  RDW 16.0 (H) 04/22/2015 0818   LYMPHSABS 1,757 03/09/2021 1119   LYMPHSABS 2.4 04/22/2015 0818   MONOABS 630 08/17/2016 0001   EOSABS 119 03/09/2021 1119   EOSABS 0.2 04/22/2015 0818   BASOSABS 42 03/09/2021 1119   BASOSABS 0.0 04/22/2015 0818    Hgb A1C Lab Results  Component Value Date   HGBA1C  5.4 08/25/2021           Assessment & Plan:     Webb Silversmith, NP

## 2022-03-03 NOTE — Assessment & Plan Note (Signed)
Currently not an issue °We will monitor °

## 2022-03-03 NOTE — Assessment & Plan Note (Signed)
Continue magnesium OTC

## 2022-03-03 NOTE — Assessment & Plan Note (Signed)
Continue mirtazapine, buspirone and bupropion He is currently seeing therapy and a psychiatrist

## 2022-03-03 NOTE — Assessment & Plan Note (Signed)
Continue trazodone 

## 2022-03-03 NOTE — Assessment & Plan Note (Signed)
Continue saw palmetto and terazosin

## 2022-03-03 NOTE — Assessment & Plan Note (Signed)
Controlled on lisinopril HCTZ Reinforced DASH diet and exercise weight loss C-Met today

## 2022-03-03 NOTE — Assessment & Plan Note (Signed)
Continue turmeric 

## 2022-03-03 NOTE — Patient Instructions (Signed)
Heart-Healthy Eating Plan Heart-healthy meal planning includes: Eating less unhealthy fats. Eating more healthy fats. Making other changes in your diet. Talk with your doctor or a diet specialist (dietitian) to create an eating plan that is right for you. What is my plan? Your doctor may recommend an eating plan that includes: Total fat: ______% or less of total calories a day. Saturated fat: ______% or less of total calories a day. Cholesterol: less than _________mg a day. What are tips for following this plan? Cooking Avoid frying your food. Try to bake, boil, grill, or broil it instead. You can also reduce fat by: Removing the skin from poultry. Removing all visible fats from meats. Steaming vegetables in water or broth. Meal planning  At meals, divide your plate into four equal parts: Fill one-half of your plate with vegetables and green salads. Fill one-fourth of your plate with whole grains. Fill one-fourth of your plate with lean protein foods. Eat 4-5 servings of vegetables per day. A serving of vegetables is: 1 cup of raw or cooked vegetables. 2 cups of raw leafy greens. Eat 4-5 servings of fruit per day. A serving of fruit is: 1 medium whole fruit.  cup of dried fruit.  cup of fresh, frozen, or canned fruit.  cup of 100% fruit juice. Eat more foods that have soluble fiber. These are apples, broccoli, carrots, beans, peas, and barley. Try to get 20-30 g of fiber per day. Eat 4-5 servings of nuts, legumes, and seeds per week: 1 serving of dried beans or legumes equals  cup after being cooked. 1 serving of nuts is  cup. 1 serving of seeds equals 1 tablespoon. General information Eat more home-cooked food. Eat less restaurant, buffet, and fast food. Limit or avoid alcohol. Limit foods that are high in starch and sugar. Avoid fried foods. Lose weight if you are overweight. Keep track of how much salt (sodium) you eat. This is important if you have high blood  pressure. Ask your doctor to tell you more about this. Try to add vegetarian meals each week. Fats Choose healthy fats. These include olive oil and canola oil, flaxseeds, walnuts, almonds, and seeds. Eat more omega-3 fats. These include salmon, mackerel, sardines, tuna, flaxseed oil, and ground flaxseeds. Try to eat fish at least 2 times each week. Check food labels. Avoid foods with trans fats or high amounts of saturated fat. Limit saturated fats. These are often found in animal products, such as meats, butter, and cream. These are also found in plant foods, such as palm oil, palm kernel oil, and coconut oil. Avoid foods with partially hydrogenated oils in them. These have trans fats. Examples are stick margarine, some tub margarines, cookies, crackers, and other baked goods. What foods can I eat? Fruits All fresh, canned (in natural juice), or frozen fruits. Vegetables Fresh or frozen vegetables (raw, steamed, roasted, or grilled). Green salads. Grains Most grains. Choose whole wheat and whole grains most of the time. Rice and pasta, including brown rice and pastas made with whole wheat. Meats and other proteins Lean, well-trimmed beef, veal, pork, and lamb. Chicken and turkey without skin. All fish and shellfish. Wild duck, rabbit, pheasant, and venison. Egg whites or low-cholesterol egg substitutes. Dried beans, peas, lentils, and tofu. Seeds and most nuts. Dairy Low-fat or nonfat cheeses, including ricotta and mozzarella. Skim or 1% milk that is liquid, powdered, or evaporated. Buttermilk that is made with low-fat milk. Nonfat or low-fat yogurt. Fats and oils Non-hydrogenated (trans-free) margarines. Vegetable oils, including   soybean, sesame, sunflower, olive, peanut, safflower, corn, canola, and cottonseed. Salad dressings or mayonnaise made with a vegetable oil. Beverages Mineral water. Coffee and tea. Diet carbonated beverages. Sweets and desserts Sherbet, gelatin, and fruit ice.  Small amounts of dark chocolate. Limit all sweets and desserts. Seasonings and condiments All seasonings and condiments. The items listed above may not be a complete list of foods and drinks you can eat. Contact a dietitian for more options. What foods should I avoid? Fruits Canned fruit in heavy syrup. Fruit in cream or butter sauce. Fried fruit. Limit coconut. Vegetables Vegetables cooked in cheese, cream, or butter sauce. Fried vegetables. Grains Breads that are made with saturated or trans fats, oils, or whole milk. Croissants. Sweet rolls. Donuts. High-fat crackers, such as cheese crackers. Meats and other proteins Fatty meats, such as hot dogs, ribs, sausage, bacon, rib-eye roast or steak. High-fat deli meats, such as salami and bologna. Caviar. Domestic duck and goose. Organ meats, such as liver. Dairy Cream, sour cream, cream cheese, and creamed cottage cheese. Whole-milk cheeses. Whole or 2% milk that is liquid, evaporated, or condensed. Whole buttermilk. Cream sauce or high-fat cheese sauce. Yogurt that is made from whole milk. Fats and oils Meat fat, or shortening. Cocoa butter, hydrogenated oils, palm oil, coconut oil, palm kernel oil. Solid fats and shortenings, including bacon fat, salt pork, lard, and butter. Nondairy cream substitutes. Salad dressings with cheese or sour cream. Beverages Regular sodas and juice drinks with added sugar. Sweets and desserts Frosting. Pudding. Cookies. Cakes. Pies. Milk chocolate or white chocolate. Buttered syrups. Full-fat ice cream or ice cream drinks. The items listed above may not be a complete list of foods and drinks to avoid. Contact a dietitian for more information. Summary Heart-healthy meal planning includes eating less unhealthy fats, eating more healthy fats, and making other changes in your diet. Eat a balanced diet. This includes fruits and vegetables, low-fat or nonfat dairy, lean protein, nuts and legumes, whole grains, and  heart-healthy oils and fats. This information is not intended to replace advice given to you by your health care provider. Make sure you discuss any questions you have with your health care provider. Document Revised: 01/08/2021 Document Reviewed: 01/08/2021 Elsevier Patient Education  2022 [employer].  

## 2022-03-03 NOTE — Assessment & Plan Note (Signed)
Encourage diet and exercise for weight loss 

## 2022-03-04 LAB — CBC
HCT: 41.8 % (ref 38.5–50.0)
Hemoglobin: 12.8 g/dL — ABNORMAL LOW (ref 13.2–17.1)
MCH: 22.7 pg — ABNORMAL LOW (ref 27.0–33.0)
MCHC: 30.6 g/dL — ABNORMAL LOW (ref 32.0–36.0)
MCV: 74.1 fL — ABNORMAL LOW (ref 80.0–100.0)
MPV: 9.6 fL (ref 7.5–12.5)
Platelets: 239 10*3/uL (ref 140–400)
RBC: 5.64 10*6/uL (ref 4.20–5.80)
RDW: 15.5 % — ABNORMAL HIGH (ref 11.0–15.0)
WBC: 6.6 10*3/uL (ref 3.8–10.8)

## 2022-03-04 LAB — COMPLETE METABOLIC PANEL WITH GFR
AG Ratio: 2 (calc) (ref 1.0–2.5)
ALT: 11 U/L (ref 9–46)
AST: 19 U/L (ref 10–35)
Albumin: 4.1 g/dL (ref 3.6–5.1)
Alkaline phosphatase (APISO): 40 U/L (ref 35–144)
BUN: 14 mg/dL (ref 7–25)
CO2: 25 mmol/L (ref 20–32)
Calcium: 9.6 mg/dL (ref 8.6–10.3)
Chloride: 105 mmol/L (ref 98–110)
Creat: 1.11 mg/dL (ref 0.70–1.22)
Globulin: 2.1 g/dL (calc) (ref 1.9–3.7)
Glucose, Bld: 92 mg/dL (ref 65–99)
Potassium: 3.7 mmol/L (ref 3.5–5.3)
Sodium: 139 mmol/L (ref 135–146)
Total Bilirubin: 0.4 mg/dL (ref 0.2–1.2)
Total Protein: 6.2 g/dL (ref 6.1–8.1)
eGFR: 67 mL/min/{1.73_m2} (ref >=60)

## 2022-03-04 LAB — T4, FREE: Free T4: 1.1 ng/dL (ref 0.8–1.8)

## 2022-03-04 LAB — LIPID PANEL
Cholesterol: 120 mg/dL (ref <200)
HDL: 42 mg/dL (ref >=40)
LDL Cholesterol (Calc): 64 mg/dL (calc)
Non-HDL Cholesterol (Calc): 78 mg/dL (calc) (ref <130)
Total CHOL/HDL Ratio: 2.9 (calc) (ref <5.0)
Triglycerides: 64 mg/dL (ref <150)

## 2022-03-04 LAB — TSH: TSH: 2.53 mIU/L (ref 0.40–4.50)

## 2022-04-06 ENCOUNTER — Other Ambulatory Visit: Payer: Self-pay

## 2022-04-06 MED ORDER — BUSPIRONE HCL 10 MG PO TABS: 10.0000 mg | ORAL_TABLET | Freq: Two times a day (BID) | ORAL | 1 refills | Status: DC

## 2022-04-22 ENCOUNTER — Ambulatory Visit: Payer: Self-pay

## 2022-04-22 NOTE — Telephone Encounter (Signed)
  Chief Complaint: shoulder injury Symptoms: started 4 months ago, injured again 5-6 days ago cutting frozen bread, R shoulder pain, unable to raise arm above head  Frequency:  Pertinent Negatives: Patient denies swelling  Disposition: '[]'$ ED /'[]'$ Urgent Care (no appt availability in office) / '[x]'$ Appointment(In office/virtual)/ '[]'$  [facility]/ '[]'$ Home Care/ '[]'$ Refused Recommended Disposition /'[]'$ [facility]/ '[]'$  Follow-up with PCP Additional Notes: pt does wear a sling at times to help with pain and movement. Pt states he has times where arm will get close to healing but pt has injured again and feels just as bad as in beginning. Advised pt of first available appt on 04/26/22 at 1600. Pt agreed with appt.   Summary: pain in rt shoulder   Patient states he injured his rt shoulder due to a fall x59m  Patient is unable to raise arm or use arm w/o causing additional pain to shoulder   Provider's next appt is 8-23   Patient is requesting an appt by 8-14 or states he will go to the ed   Please fu w/ patient      Reason for Disposition  Can't move injured shoulder normally (e.g., full range of motion, able to touch top of head)  Answer Assessment - Initial Assessment Questions 1. MECHANISM: "How did the injury happen?"     FGolden Circlegoing down steps R shoulder  2. ONSET: "When did the injury happen?" (Minutes or hours ago)      4 months ago, injured again 5-6 days ago when was cutting frozen bread 4. SEVERITY: "Can you move the shoulder normally?"      No, unable to raise arm  6. PAIN: "Is there pain?" If Yes, ask: "How bad is the pain?"   (e.g., Scale 1-10; or mild, moderate, severe)   - MILD (1-3): doesn't interfere with normal activities   - MODERATE (4-7): interferes with normal activities (e.g., work or school) or awakens from sleep   - SEVERE (8-10): excruciating pain, unable to do any normal activities, unable to move arm at all due to pain     Mild  8. OTHER SYMPTOMS: "Do  you have any other symptoms?" (e.g., loss of sensation)  Protocols used: Shoulder Injury-A-AH

## 2022-04-26 ENCOUNTER — Ambulatory Visit: Payer: Medicare Other | Admitting: Family Medicine

## 2022-04-28 ENCOUNTER — Encounter: Payer: Self-pay | Admitting: Family Medicine

## 2022-04-28 ENCOUNTER — Ambulatory Visit (INDEPENDENT_AMBULATORY_CARE_PROVIDER_SITE_OTHER): Payer: Medicare Other | Admitting: Family Medicine

## 2022-04-28 VITALS — BP 136/85 | HR 105 | Ht 70.0 in | Wt 181.2 lb

## 2022-04-28 DIAGNOSIS — D508 Other iron deficiency anemias: Secondary | ICD-10-CM

## 2022-04-28 DIAGNOSIS — M25811 Other specified joint disorders, right shoulder: Secondary | ICD-10-CM | POA: Diagnosis not present

## 2022-04-28 NOTE — Patient Instructions (Addendum)
Thank you for coming to the office today.  Remember to take Vitamin C when you take the Iron Supplement. You can do Vitamin C tablet or supplement, or juice! It improves the iron absorption.  You can do lab work next week for the repeat Iron Testing. You do not need to fasting. You can eat or drink.  START anti inflammatory topical - OTC Voltaren (generic Diclofenac) topical 2-4 times a day as needed for pain swelling of affected joint for 1-2 weeks or longer.  Most likely you have bursitis of your shoulder. This is inflammation of the shoulder joint caused most often by arthritis or wear and tear. Often it can flare up to cause bursitis due to repetitive activities or other triggers. It may take time to heal, possibly 2 to 6 weeks, and it is important to avoid over use of shoulder especially above head motions that can re-aggravate the problem.  If not improving with topical then you can try the oral pill.  Recommend trial of Anti-inflammatory with Aleve OTC '220mg'$  tabs - take TWO with food and plenty of water TWICE daily every day (breakfast and dinner), for next 1 to 2 weeks, then you may take only as needed  - DO NOT TAKE any ibuprofen, motrin while you are taking this medicine  Recommend to start taking Tylenol Extra Strength '500mg'$  tabs - take 1 to 2 tabs per dose (max '1000mg'$ ) every 6-8 hours for pain (take regularly, don't skip a dose for next 7 days), max 24 hour daily dose is 6 tablets or '3000mg'$ . In the future you can repeat the same everyday Tylenol course for 1-2 weeks at a time.   Also we can refer you to Physical Therapy if just need to improve on range of motion.  Lastly if not improved by about 4-6 weeks - or just need treatment sooner - call and return for a Steroid Injection in shoulder.   Please schedule a Follow-up Appointment to: Return in about 4 weeks (around 05/26/2022) for Return next week for lab only non fasting.  If you have any other questions or concerns, please feel  free to call the office or send a message through [facility]. You may also schedule an earlier appointment if necessary.  Additionally, you may be receiving a survey about your experience at our office within a few days to 1 week by e-mail or mail. We value your feedback.  Nobie Putnam, DO [facility], [facility]  Range of Motion Shoulder Exercises  [place] with your good arm against a counter or table for support [facility] forward with a wide stance (make sure your body is comfortable) - Your painful shoulder should hang down and feel "heavy" - Gently move your painful arm in small circles "clockwise" for several turns - Switch to "counterclockwise" for several turns - Early on keep circles narrow and move slowly - Later in rehab, move in larger circles and faster movement   Wall Crawl - Stand close (about 1-2 ft away) to a wall, facing it directly - Reach out with your arm of painful shoulder and place fingers (not palm) on wall - You should make contact with wall at your waist level - Slowly walk your fingers up the wall. Stay in contact with wall entire time, do not remove fingers - Keep walking fingers up wall until you reach shoulder level - You may feel tightening or mild discomfort, once you reach a height that causes pain or if  you are already above your shoulder height then stop. Repeat from starting position. - Early on stand closer to wall, move fingers slowly, and stay at or below shoulder level - Later in rehab, stand farther away from wall (fingertips), move fingers quicker, go above shoulder level
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
## 2022-05-26 ENCOUNTER — Ambulatory Visit: Payer: Medicare Other | Admitting: Family Medicine

## 2022-06-03 ENCOUNTER — Ambulatory Visit
Admission: RE | Admit: 2022-06-03 | Discharge: 2022-06-03 | Disposition: A | Discharge status: Home / Self Care | Payer: Medicare Other | Source: Ambulatory Visit | Attending: Internal Medicine | Admitting: Internal Medicine

## 2022-06-03 ENCOUNTER — Ambulatory Visit
Admission: RE | Admit: 2022-06-03 | Discharge: 2022-06-03 | Disposition: A | Discharge status: Home / Self Care | Payer: Medicare Other | Attending: Internal Medicine | Admitting: Internal Medicine

## 2022-06-03 ENCOUNTER — Ambulatory Visit (INDEPENDENT_AMBULATORY_CARE_PROVIDER_SITE_OTHER): Payer: Medicare Other | Admitting: Internal Medicine

## 2022-06-03 ENCOUNTER — Encounter: Payer: Self-pay | Admitting: Internal Medicine

## 2022-06-03 VITALS — BP 140/76 | HR 86 | Temp 97.5°F | Wt 186.0 lb

## 2022-06-03 DIAGNOSIS — D508 Other iron deficiency anemias: Secondary | ICD-10-CM | POA: Diagnosis not present

## 2022-06-03 DIAGNOSIS — Z23 Encounter for immunization: Secondary | ICD-10-CM

## 2022-06-03 DIAGNOSIS — M25511 Pain in right shoulder: Secondary | ICD-10-CM | POA: Insufficient documentation

## 2022-06-03 DIAGNOSIS — G8929 Other chronic pain: Secondary | ICD-10-CM | POA: Diagnosis not present

## 2022-06-03 DIAGNOSIS — R29898 Other symptoms and signs involving the musculoskeletal system: Secondary | ICD-10-CM | POA: Diagnosis not present

## 2022-06-03 DIAGNOSIS — M25561 Pain in right knee: Secondary | ICD-10-CM | POA: Diagnosis not present

## 2022-06-03 NOTE — Patient Instructions (Signed)

## 2022-06-03 NOTE — Assessment & Plan Note (Signed)
CBC with differential and iron panel today Continue oral iron

## 2022-06-03 NOTE — Progress Notes (Signed)
Subjective:    Patient ID: Joe Hardy, male    DOB: May 30, 1941, 81 y.o.   MRN: [id number]  HPI  Patient presents to clinic today for follow-up of right shoulder pain. This started after a fall in April 2023. He describes the pain as achy but can be sharp with certain movements. He denies numbness or tingling in his right arm. He was seen 04/2022 for the same.  He was advised to try Voltaren gel, Tylenol, rest and ice. He has also been using Blue Emu.He reports it is slowly getting better.  He also reports popping of his right knee. He reports he recently noticed this within the last 6 weeks. He denies overt pain. He denies swelling, numbness, tingling or weakness.  He is also due to follow up iron deficiency anemia.  His last H/H was 12.8/41.8, 02/2020. He was started on oral iron which he has been taking consistently. He denies s/ of bleeding.  Review of Systems     Past Medical History:  Diagnosis Date   Depression    GERD (gastroesophageal reflux disease)    Hyperlipidemia    Hypertension    Migraine    Sleep apnea     Current Outpatient Medications  Medication Sig Dispense Refill   Ascorbic Acid (VITAMIN C) 1000 MG tablet Take 1,000 mg by mouth daily.     atorvastatin (LIPITOR) 20 MG tablet TAKE 1 TABLET BY MOUTH  DAILY AT 6PM 90 tablet 3   b complex vitamins tablet Take 1 tablet by mouth daily.     Blood Pressure Monitoring (SM WRIST CUFF BP MONITOR) MISC 1 Units by Does not apply route 2 (two) times a week. 1 each 0   buPROPion (WELLBUTRIN XL) 150 MG 24 hr tablet Take 450 mg by mouth daily.     busPIRone (BUSPAR) 10 MG tablet Take 1 tablet (10 mg total) by mouth 2 (two) times daily. 180 tablet 1   cholecalciferol (VITAMIN D3) 25 MCG (1000 UT) tablet Take 4,000 Units by mouth daily.      cyanocobalamin 1000 MCG tablet Take 1,000 mcg by mouth daily.     Flaxseed, Linseed, (FLAX SEEDS PO) Take by mouth.     GARLIC PO Take by mouth.     levothyroxine (SYNTHROID) 75 MCG  tablet TAKE 1 TABLET BY MOUTH  DAILY BEFORE BREAKFAST 90 tablet 3   lisinopril-hydrochlorothiazide (ZESTORETIC) 10-12.5 MG tablet Take 1 tablet by mouth daily. 90 tablet 3   magnesium 30 MG tablet Take 400 mg by mouth daily.     mirtazapine (REMERON) 30 MG tablet Take 30 mg by mouth at bedtime.     Multiple Vitamin (MULTIVITAMIN WITH MINERALS) TABS tablet Take 1 tablet by mouth daily.     Saw Palmetto 450 MG CAPS Take 450 mg by mouth daily.     terazosin (HYTRIN) 5 MG capsule TAKE 1 CAPSULE BY MOUTH  DAILY 90 capsule 3   traZODone (DESYREL) 150 MG tablet Take 150 mg by mouth at bedtime.     TURMERIC PO Take 500 mg by mouth 2 (two) times daily.     No current facility-administered medications for this visit.    Allergies  Allergen Reactions   Fluoxetine     insomnia   Venlafaxine     difficulty sleeping    Family History  Problem Relation Age of Onset   Tuberculosis Mother    Asthma Mother    Emphysema Father    Heart attack Father  Social History   Socioeconomic History   Marital status: Divorced    Spouse name: Not on file   Number of children: Not on file   Years of education: Not on file   Highest education level: Bachelor's degree (e.g., BA, AB, BS)  Occupational History   Occupation: retired  Tobacco Use   Smoking status: Former    Packs/day: 1.50    Years: 20.00    Total pack years: 30.00    Types: Cigarettes    Quit date: 05/02/2017    Years since quitting: 5.0   Smokeless tobacco: Former   Tobacco comments:    Starts and stops related to anxiety rather than drinking alcohol  Vaping Use   Vaping Use: Never used  Substance and Sexual Activity   Alcohol use: No    Alcohol/week: 0.0 standard drinks of alcohol    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)   Drug use: No   Sexual activity: Not Currently  Other Topics Concern   Not on file  Social History Narrative   Not on file   Social Determinants of Health   Financial Resource Strain: Low  Risk  (10/09/2021)   Overall Financial Resource Strain (CARDIA)    Difficulty of Paying Living Expenses: Not hard at all  Food Insecurity: No Food Insecurity (10/09/2021)   Hunger Vital Sign    Worried About Running Out of Food in the Last Year: Never true    [place] in the Last Year: Never true  Transportation Needs: No Transportation Needs (10/09/2021)   PRAPARE - Hydrologist (Medical): No    Lack of Transportation (Non-Medical): No  Physical Activity: Inactive (10/09/2021)   Exercise Vital Sign    Days of Exercise per Week: 0 days    Minutes of Exercise per Session: 0 min  Stress: No Stress Concern Present (10/09/2021)   [place]    Feeling of Stress : Not at all  Social Connections: Moderately Isolated (10/09/2021)   Social Connection and Isolation Panel [NHANES]    Frequency of Communication with Friends and Family: More than three times a week    Frequency of Social Gatherings with Friends and Family: More than three times a week    Attends Religious Services: Never    Marine scientist or Organizations: Yes    Attends Archivist Meetings: Never    Marital Status: Divorced  Human resources officer Violence: Not At Risk (10/09/2021)   Humiliation, Afraid, Rape, and Kick questionnaire    Fear of Current or Ex-Partner: No    Emotionally Abused: No    Physically Abused: No    Sexually Abused: No     Constitutional: Denies fever, malaise, fatigue, headache or abrupt weight changes.  Respiratory: Denies difficulty breathing, shortness of breath, cough or sputum production.   Cardiovascular: Denies chest pain, chest tightness, palpitations or swelling in the hands or feet.  Musculoskeletal: Patient reports right shoulder pain and right knee pain.  Denies decrease in range of motion, difficulty with gait, muscle pain or joint swelling.  Skin: Denies redness, rashes, lesions  or ulcercations.  Neurological: Denies dizziness, difficulty with memory, difficulty with speech or problems with balance and coordination.    No other specific complaints in a complete review of systems (except as listed in HPI above).  Objective:   Physical Exam  BP (!) 144/86 (BP Location: Left Arm, Patient Position: Sitting, Cuff Size: Normal)  Pulse 86   Temp (!) 97.5 F (36.4 C) (Temporal)   Wt 186 lb (84.4 kg)   SpO2 99%   BMI 26.69 kg/m   Wt Readings from Last 3 Encounters:  04/28/22 181 lb 3.2 oz (82.2 kg)  03/03/22 176 lb (79.8 kg)  10/09/21 194 lb 6.4 oz (88.2 kg)    General: Appears his stated age, overweight, in NAD. Cardiovascular: Normal rate and rhythm. S1,S2 noted.  No murmur, rubs or gallops noted.  Pulmonary/Chest: Normal effort and positive vesicular breath sounds. No respiratory distress. No wheezes, rales or ronchi noted.  Musculoskeletal: Decreased internal and external rotation of the right shoulder.  Positive drop can test on the right.  No pain with palpation of the shoulder.  Strength 5/5 BUE.  Handgrips equal.  Joint enlargement noted of the right knee.  No pain with palpation noted of the right knee.  He does have some popping over the medial knee with extension.  No joint swelling noted.  Strength 5/5 BLE.  No difficulty with gait. Neurological: Alert and oriented.  BMET    Component Value Date/Time   NA 139 03/03/2022 1003   NA 140 04/20/2017 0925   K 3.7 03/03/2022 1003   CL 105 03/03/2022 1003   CO2 25 03/03/2022 1003   GLUCOSE 92 03/03/2022 1003   BUN 14 03/03/2022 1003   BUN 9 04/20/2017 0925   CREATININE 1.11 03/03/2022 1003   CALCIUM 9.6 03/03/2022 1003   GFRNONAA 85 03/09/2021 1119   GFRAA 98 03/09/2021 1119    Lipid Panel     Component Value Date/Time   CHOL 120 03/03/2022 1003   CHOL 113 04/20/2017 0925   TRIG 64 03/03/2022 1003   HDL 42 03/03/2022 1003   HDL 26 (L) 04/20/2017 0925   CHOLHDL 2.9 03/03/2022 1003   VLDL 24  08/17/2016 0001   LDLCALC 64 03/03/2022 1003    CBC    Component Value Date/Time   WBC 6.6 03/03/2022 1003   RBC 5.64 03/03/2022 1003   HGB 12.8 (L) 03/03/2022 1003   HGB 13.0 04/22/2015 0818   HCT 41.8 03/03/2022 1003   HCT 38.5 04/22/2015 0818   PLT 239 03/03/2022 1003   PLT 257 04/22/2015 0818   MCV 74.1 (L) 03/03/2022 1003   MCV 69 (L) 04/22/2015 0818   MCH 22.7 (L) 03/03/2022 1003   MCHC 30.6 (L) 03/03/2022 1003   RDW 15.5 (H) 03/03/2022 1003   RDW 16.0 (H) 04/22/2015 0818   LYMPHSABS 1,757 03/09/2021 1119   LYMPHSABS 2.4 04/22/2015 0818   MONOABS 630 08/17/2016 0001   EOSABS 119 03/09/2021 1119   EOSABS 0.2 04/22/2015 0818   BASOSABS 42 03/09/2021 1119   BASOSABS 0.0 04/22/2015 0818    Hgb A1C Lab Results  Component Value Date   HGBA1C 5.4 08/25/2021           Assessment & Plan:   Chronic Right Shoulder Pain:  Continue Voltaren gel, Tylenol and blue emu as needed X-ray right shoulder today Consider referral to orthopedics for further evaluation and treatment  Right Knee Popping:  Could be a meniscal injury He is not having much pain which is reassuring X-ray right knee today Consider referral to orthopedics for further evaluation and treatment   RTC in 3 months for your annual exam Webb Silversmith, NP

## 2022-06-04 LAB — CBC WITH DIFFERENTIAL/PLATELET
Absolute Monocytes: 500 cells/uL (ref 200–950)
Basophils Absolute: 32 cells/uL (ref 0–200)
Basophils Relative: 0.7 %
Eosinophils Absolute: 99 cells/uL (ref 15–500)
Eosinophils Relative: 2.2 %
HCT: 41.6 % (ref 38.5–50.0)
Hemoglobin: 13.1 g/dL — ABNORMAL LOW (ref 13.2–17.1)
Lymphs Abs: 1503 cells/uL (ref 850–3900)
MCH: 22.6 pg — ABNORMAL LOW (ref 27.0–33.0)
MCHC: 31.5 g/dL — ABNORMAL LOW (ref 32.0–36.0)
MCV: 71.7 fL — ABNORMAL LOW (ref 80.0–100.0)
MPV: 10 fL (ref 7.5–12.5)
Monocytes Relative: 11.1 %
Neutro Abs: 2367 cells/uL (ref 1500–7800)
Neutrophils Relative %: 52.6 %
Platelets: 255 10*3/uL (ref 140–400)
RBC: 5.8 10*6/uL (ref 4.20–5.80)
RDW: 16.3 % — ABNORMAL HIGH (ref 11.0–15.0)
Total Lymphocyte: 33.4 %
WBC: 4.5 10*3/uL (ref 3.8–10.8)

## 2022-06-04 LAB — IRON,TIBC AND FERRITIN PANEL
%SAT: 13 % (calc) — ABNORMAL LOW (ref 20–48)
Ferritin: 278 ng/mL (ref 24–380)
Iron: 31 ug/dL — ABNORMAL LOW (ref 50–180)
TIBC: 247 mcg/dL (calc) — ABNORMAL LOW (ref 250–425)

## 2022-06-04 NOTE — Addendum Note (Signed)
Addended by: Kizzie Furnish on: 06/04/2022 09:27 AM   Modules accepted: Orders

## 2022-06-10 NOTE — Addendum Note (Signed)
Addended by: Jearld Fenton on: 06/10/2022 07:42 AM   Modules accepted: Orders

## 2022-06-11 ENCOUNTER — Telehealth: Payer: Self-pay

## 2022-06-11 NOTE — Telephone Encounter (Signed)
Please review pt's labs.   Thank you,   -Mickel Baas

## 2022-06-11 NOTE — Telephone Encounter (Signed)
Copied from [place] [phone]. Topic: General - Other >> Jun 10, 2022  8:38 AM Everette C wrote: Reason for CRM: The patient would like to continue their previous conversation with L. Volanda Napoleon when possible  The patient has follow up questions related to their previously discussed insurance concerns   The patient would also like to further review their most recent labs  Please contact when possible

## 2022-06-14 NOTE — Telephone Encounter (Signed)
Please notify patient. He does not use mychart for labs.  Patient seen by Webb Silversmith, FNP on 9/21 and had labs drawn for anemia.  I have reviewed them now.  Hemoglobin has improved, from 12.8 up to 13.1  His anemia panel shows still mild low iron and iron saturation 13%, but normal Ferritin 278 reserves.  Keep on current treatment with iron supplement.  Make sure to take with Vitamin C to boost absorption.  Nobie Putnam, DO [facility] 06/14/2022, 8:52 AM

## 2022-06-16 ENCOUNTER — Ambulatory Visit: Payer: Self-pay | Admitting: *Deleted

## 2022-06-16 NOTE — Telephone Encounter (Signed)
Patient called regarding lab results from 06/03/22. Pt given lab results per notes of Dr. Parks Ranger from 06/14/22 regarding 06/03/22 labs for anemia on 06/16/22. Pt verbalized understanding and reports he has been taking medication iron supplement for anemia with Vit C 1000 mg. Patient reports he does not eat a lot of red meat but does eat healthy diet. Patient would like to know if there is anything else he should be taking to increase iron. Reviewed with patient x ray results. Pt given x ray results per notes of R. Baity, NP from 06/07/22 on 06/16/22. Pt verbalized understanding and reports he is going to see Dr. Jeneen Rinks orthopedic. Please advise.

## 2022-06-16 NOTE — Telephone Encounter (Signed)
See Triage call.   Thanks,   -Mickel Baas

## 2022-06-21 DIAGNOSIS — S46011A Strain of muscle(s) and tendon(s) of the rotator cuff of right shoulder, initial encounter: Secondary | ICD-10-CM | POA: Diagnosis not present

## 2022-06-21 DIAGNOSIS — M1711 Unilateral primary osteoarthritis, right knee: Secondary | ICD-10-CM | POA: Diagnosis not present

## 2022-07-20 ENCOUNTER — Other Ambulatory Visit: Payer: Self-pay | Admitting: Internal Medicine

## 2022-07-20 DIAGNOSIS — M25511 Pain in right shoulder: Secondary | ICD-10-CM | POA: Diagnosis not present

## 2022-07-20 DIAGNOSIS — E7800 Pure hypercholesterolemia, unspecified: Secondary | ICD-10-CM

## 2022-07-20 DIAGNOSIS — M25561 Pain in right knee: Secondary | ICD-10-CM | POA: Diagnosis not present

## 2022-07-20 DIAGNOSIS — I10 Essential (primary) hypertension: Secondary | ICD-10-CM

## 2022-07-20 NOTE — Telephone Encounter (Signed)
Requested Prescriptions  Pending Prescriptions Disp Refills   atorvastatin (LIPITOR) 20 MG tablet [Pharmacy Med Name: Atorvastatin Calcium 20 MG Oral Tablet] 90 tablet 1    Sig: TAKE 1 TABLET BY MOUTH  DAILY AT 6PM     Cardiovascular:  Antilipid - Statins Failed - 07/20/2022  6:27 AM      Failed - Lipid Panel in normal range within the last 12 months    Cholesterol, Total  Date Value Ref Range Status  04/20/2017 113 100 - 199 mg/dL Final   Cholesterol  Date Value Ref Range Status  03/03/2022 120 <200 mg/dL Final   LDL Cholesterol (Calc)  Date Value Ref Range Status  03/03/2022 64 mg/dL (calc) Final    Comment:    Reference range: <100 . Desirable range <100 mg/dL for primary prevention;   <70 mg/dL for patients with CHD or diabetic patients  with > or = 2 CHD risk factors. Marland Kitchen LDL-C is now calculated using the Martin-Hopkins  calculation, which is a validated novel method providing  better accuracy than the Friedewald equation in the  estimation of LDL-C.  Cresenciano Genre et al. Annamaria Helling. 6811;572(62): 2061-2068  (http://education.[facility].com/faq/FAQ164)    HDL  Date Value Ref Range Status  03/03/2022 42 > OR = 40 mg/dL Final  04/20/2017 26 (L) >39 mg/dL Final   Triglycerides  Date Value Ref Range Status  03/03/2022 64 <150 mg/dL Final         Passed - Patient is not pregnant      Passed - Valid encounter within last 12 months    Recent Outpatient Visits           1 month ago Chronic right shoulder pain   [facility] [place], [place], NP   2 months ago Shoulder impingement, right   [facility] [place], Devonne Doughty, DO   4 months ago Acquired hypothyroidism   [facility] [place], Coralie Keens, NP   1 year ago Primary osteoarthritis involving multiple joints   [facility] [place], [place], NP   1 year ago [facility] [place], Coralie Keens, NP       Future Appointments              In 2 months [place], Coralie Keens, NP [facility]             lisinopril-hydrochlorothiazide (ZESTORETIC) 10-12.5 MG tablet [Pharmacy Med Name: Lisinopril-hydroCHLOROthiazide 10-12.5 MG Oral Tablet] 90 tablet 0    Sig: TAKE 1 TABLET BY MOUTH  DAILY     Cardiovascular:  ACEI + Diuretic Combos Failed - 07/20/2022  6:27 AM      Failed - Last BP in normal range    BP Readings from Last 1 Encounters:  06/03/22 (!) 140/76         Passed - Na in normal range and within 180 days    Sodium  Date Value Ref Range Status  03/03/2022 139 135 - 146 mmol/L Final  04/20/2017 140 134 - 144 mmol/L Final         Passed - K in normal range and within 180 days    Potassium  Date Value Ref Range Status  03/03/2022 3.7 3.5 - 5.3 mmol/L Final         Passed - Cr in normal range and within 180 days    Creat  Date Value Ref Range Status  03/03/2022 1.11 0.70 - 1.22 mg/dL  Final         Passed - eGFR is 30 or above and within 180 days    GFR, Est African American  Date Value Ref Range Status  03/09/2021 98 > OR = 60 mL/min/1.66m Final   GFR, Est Non African American  Date Value Ref Range Status  03/09/2021 85 > OR = 60 mL/min/1.754mFinal   eGFR  Date Value Ref Range Status  03/03/2022 67 > OR = 60 mL/min/1.7313minal    Comment:    The eGFR is based on the CKD-EPI 2021 equation. To calculate  the new eGFR from a previous Creatinine or Cystatin C result, go to https://www.kidney.org/professionals/ kdoqi/gfr%5Fcalculator          Passed - Patient is not pregnant      Passed - Valid encounter within last 6 months    Recent Outpatient Visits           1 month ago [facility] right shoulder [facility]   [facility]   2 months ago Shoulder impingement, right   [place]   4 months ago Acquired hypothyroidism   [facility]   1 year ago Primary osteoarthritis  involving multiple joints   [facility]   1 year ago [facility]       Future Appointments             In 2 months Baity, RegCoralie KeensP [facility]

## 2022-07-29 ENCOUNTER — Other Ambulatory Visit: Payer: Self-pay | Admitting: Internal Medicine

## 2022-07-29 DIAGNOSIS — N138 Other obstructive and reflux uropathy: Secondary | ICD-10-CM

## 2022-07-29 DIAGNOSIS — E034 Atrophy of thyroid (acquired): Secondary | ICD-10-CM

## 2022-07-29 NOTE — Telephone Encounter (Signed)
Requested Prescriptions  Pending Prescriptions Disp Refills   levothyroxine (SYNTHROID) 75 MCG tablet [Pharmacy Med Name: Levothyroxine Sodium 75 MCG Oral Tablet] 100 tablet 2    Sig: TAKE 1 TABLET BY MOUTH DAILY  BEFORE BREAKFAST     Endocrinology:  Hypothyroid Agents Passed - 07/29/2022  6:15 AM      Passed - TSH in normal range and within 360 days    TSH  Date Value Ref Range Status  03/03/2022 2.53 0.40 - 4.50 mIU/L Final         Passed - Valid encounter within last 12 months    Recent Outpatient Visits           1 month ago Chronic right shoulder pain   [facility] [place], Coralie Keens, NP   3 months ago Shoulder impingement, right   [facility] [place], Devonne Doughty, DO   4 months ago Acquired hypothyroidism   [facility] [place], Coralie Keens, NP   1 year ago Primary osteoarthritis involving multiple joints   [facility] [place], Coralie Keens, NP   1 year ago [facility] [place], Coralie Keens, NP       Future Appointments             In 1 month Baity, Coralie Keens, NP [facility], [facility]             terazosin (HYTRIN) 5 MG capsule [Pharmacy Med Name: Terazosin HCl 5 MG Oral Capsule] 100 capsule 2    Sig: TAKE 1 CAPSULE BY MOUTH DAILY     Cardiovascular:  Alpha Blockers Failed - 07/29/2022  6:15 AM      Failed - Last BP in normal range    BP Readings from Last 1 Encounters:  06/03/22 (!) 140/76         Passed - Valid encounter within last 6 months    Recent Outpatient Visits           1 month ago Chronic right shoulder pain   [facility] [place], Coralie Keens, NP   3 months ago Shoulder impingement, right   [place], DO   4 months ago Acquired hypothyroidism   [facility] [place], Coralie Keens, NP   1 year ago Primary osteoarthritis involving multiple joints   [facility] [place], Coralie Keens, NP    1 year ago [facility] [place], Coralie Keens, NP       Future Appointments             In 1 month Baity, Coralie Keens, NP [facility], [facility]

## 2022-08-02 DIAGNOSIS — M1711 Unilateral primary osteoarthritis, right knee: Secondary | ICD-10-CM | POA: Diagnosis not present

## 2022-08-02 DIAGNOSIS — S46011A Strain of muscle(s) and tendon(s) of the rotator cuff of right shoulder, initial encounter: Secondary | ICD-10-CM | POA: Diagnosis not present

## 2022-08-16 DIAGNOSIS — M25561 Pain in right knee: Secondary | ICD-10-CM | POA: Diagnosis not present

## 2022-08-16 DIAGNOSIS — M25511 Pain in right shoulder: Secondary | ICD-10-CM | POA: Diagnosis not present

## 2022-08-18 ENCOUNTER — Telehealth: Payer: Self-pay | Admitting: Internal Medicine

## 2022-08-28 ENCOUNTER — Other Ambulatory Visit: Payer: Self-pay | Admitting: Internal Medicine

## 2022-08-28 DIAGNOSIS — E034 Atrophy of thyroid (acquired): Secondary | ICD-10-CM

## 2022-08-28 DIAGNOSIS — N138 Other obstructive and reflux uropathy: Secondary | ICD-10-CM

## 2022-08-30 NOTE — Telephone Encounter (Signed)
Requested Prescriptions  Pending Prescriptions Disp Refills   terazosin (HYTRIN) 5 MG capsule [Pharmacy Med Name: Terazosin HCl 5 MG Oral Capsule] 90 capsule 0    Sig: TAKE 1 CAPSULE BY MOUTH DAILY     Cardiovascular:  Alpha Blockers Failed - 08/28/2022  2:57 PM      Failed - Last BP in normal range    BP Readings from Last 1 Encounters:  06/03/22 (!) 140/76         Passed - Valid encounter within last 6 months    Recent Outpatient Visits           2 months ago Chronic right shoulder pain   [facility] [place], Coralie Keens, NP   4 months ago Shoulder impingement, right   [facility] [place], Devonne Doughty, DO   6 months ago Acquired hypothyroidism   [facility] [place], Coralie Keens, NP   1 year ago Primary osteoarthritis involving multiple joints   [facility] [place], Coralie Keens, NP   1 year ago [facility] [place], Coralie Keens, NP       Future Appointments             In 3 weeks Garnette Gunner, Coralie Keens, NP [facility], [facility]             levothyroxine (SYNTHROID) 75 MCG tablet [Pharmacy Med Name: Levothyroxine Sodium 75 MCG Oral Tablet] 90 tablet 1    Sig: TAKE 1 TABLET BY MOUTH DAILY  BEFORE BREAKFAST     Endocrinology:  Hypothyroid Agents Passed - 08/28/2022  2:57 PM      Passed - TSH in normal range and within 360 days    TSH  Date Value Ref Range Status  03/03/2022 2.53 0.40 - 4.50 mIU/L Final         Passed - Valid encounter within last 12 months    Recent Outpatient Visits           2 months ago Chronic right shoulder pain   [facility] [place], Coralie Keens, NP   4 months ago Shoulder impingement, right   [place], DO   6 months ago Acquired hypothyroidism   [facility] [place], Coralie Keens, NP   1 year ago Primary osteoarthritis involving multiple joints   [facility] [place], Coralie Keens, NP    1 year ago [facility] [place], Coralie Keens, NP       Future Appointments             In 3 weeks Garnette Gunner, Coralie Keens, NP [facility]

## 2022-09-01 ENCOUNTER — Other Ambulatory Visit: Payer: Self-pay | Admitting: Internal Medicine

## 2022-09-01 DIAGNOSIS — E034 Atrophy of thyroid (acquired): Secondary | ICD-10-CM

## 2022-09-01 DIAGNOSIS — N401 Enlarged prostate with lower urinary tract symptoms: Secondary | ICD-10-CM

## 2022-09-01 NOTE — Telephone Encounter (Signed)
Both reordered 08/30/22 and sent to [facility]  Requested Prescriptions  Refused Prescriptions Disp Refills   levothyroxine (SYNTHROID) 75 MCG tablet [Pharmacy Med Name: Levothyroxine Sodium 75 MCG Oral Tablet] 60 tablet 5    Sig: TAKE 1 TABLET BY MOUTH DAILY  BEFORE BREAKFAST     Endocrinology:  Hypothyroid Agents Passed - 09/01/2022  5:42 AM      Passed - TSH in normal range and within 360 days    TSH  Date Value Ref Range Status  03/03/2022 2.53 0.40 - 4.50 mIU/L Final         Passed - Valid encounter within last 12 months    Recent Outpatient Visits           3 months ago Chronic right shoulder pain   [facility] [place], Coralie Keens, NP   4 months ago Shoulder impingement, right   [facility] [place], Devonne Doughty, DO   6 months ago Acquired hypothyroidism   [facility] [place], Coralie Keens, NP   1 year ago Primary osteoarthritis involving multiple joints   [facility] [place], Coralie Keens, NP   1 year ago [facility] [place], Coralie Keens, NP       Future Appointments             In 3 weeks Garnette Gunner, Coralie Keens, NP [facility]             terazosin (HYTRIN) 5 MG capsule [Pharmacy Med Name: Terazosin HCl 5 MG Oral Capsule] 60 capsule 5    Sig: TAKE 1 CAPSULE BY MOUTH DAILY     Cardiovascular:  Alpha Blockers Failed - 09/01/2022  5:42 AM      Failed - Last BP in normal range    BP Readings from Last 1 Encounters:  06/03/22 (!) 140/76         Passed - Valid encounter within last 6 months    Recent Outpatient Visits           3 months ago Chronic right shoulder pain   [facility] [place], Coralie Keens, NP   4 months ago Shoulder impingement, right   [place], DO   6 months ago Acquired hypothyroidism   [facility] [place], Coralie Keens, NP   1 year ago Primary osteoarthritis involving multiple joints    [facility] [place], Coralie Keens, NP   1 year ago [facility] [place], Coralie Keens, NP       Future Appointments             In 3 weeks Garnette Gunner, Coralie Keens, NP [facility], [facility]

## 2022-09-22 DIAGNOSIS — M25561 Pain in right knee: Secondary | ICD-10-CM | POA: Diagnosis not present

## 2022-09-22 DIAGNOSIS — M25511 Pain in right shoulder: Secondary | ICD-10-CM | POA: Diagnosis not present

## 2022-09-23 ENCOUNTER — Ambulatory Visit (INDEPENDENT_AMBULATORY_CARE_PROVIDER_SITE_OTHER): Payer: 59 | Admitting: Internal Medicine

## 2022-09-23 ENCOUNTER — Encounter: Payer: Self-pay | Admitting: Internal Medicine

## 2022-09-23 VITALS — BP 139/76 | HR 87 | Temp 97.1°F | Ht 69.5 in | Wt 180.0 lb

## 2022-09-23 DIAGNOSIS — E782 Mixed hyperlipidemia: Secondary | ICD-10-CM

## 2022-09-23 DIAGNOSIS — Z0001 Encounter for general adult medical examination with abnormal findings: Secondary | ICD-10-CM

## 2022-09-23 DIAGNOSIS — Z125 Encounter for screening for malignant neoplasm of prostate: Secondary | ICD-10-CM

## 2022-09-23 DIAGNOSIS — Z23 Encounter for immunization: Secondary | ICD-10-CM | POA: Diagnosis not present

## 2022-09-23 DIAGNOSIS — Z6826 Body mass index (BMI) 26.0-26.9, adult: Secondary | ICD-10-CM

## 2022-09-23 DIAGNOSIS — R7309 Other abnormal glucose: Secondary | ICD-10-CM

## 2022-09-23 DIAGNOSIS — E039 Hypothyroidism, unspecified: Secondary | ICD-10-CM | POA: Diagnosis not present

## 2022-09-23 DIAGNOSIS — E663 Overweight: Secondary | ICD-10-CM

## 2022-09-23 NOTE — Patient Instructions (Signed)
Health Maintenance, Male Adopting a healthy lifestyle and getting preventive care are important in promoting health and wellness. Ask your health care provider about: The right schedule for you to have regular tests and exams. Things you can do on your own to prevent diseases and keep yourself healthy. What should I know about diet, weight, and exercise? Eat a healthy diet  Eat a diet that includes plenty of vegetables, fruits, low-fat dairy products, and lean protein. Do not eat a lot of foods that are high in solid fats, added sugars, or sodium. Maintain a healthy weight Body mass index (BMI) is a measurement that can be used to identify possible weight problems. It estimates body fat based on height and weight. Your health care provider can help determine your BMI and help you achieve or maintain a healthy weight. Get regular exercise Get regular exercise. This is one of the most important things you can do for your health. Most adults should: Exercise for at least 150 minutes each week. The exercise should increase your heart rate and make you sweat (moderate-intensity exercise). Do strengthening exercises at least twice a week. This is in addition to the moderate-intensity exercise. Spend less time sitting. Even light physical activity can be beneficial. Watch cholesterol and blood lipids Have your blood tested for lipids and cholesterol at 82 years of age, then have this test every 5 years. You may need to have your cholesterol levels checked more often if: Your lipid or cholesterol levels are high. You are older than 82 years of age. You are at high risk for heart disease. What should I know about cancer screening? Many types of cancers can be detected early and may often be prevented. Depending on your health history and family history, you may need to have cancer screening at various ages. This may include screening for: Colorectal cancer. Prostate cancer. Skin cancer. Lung  cancer. What should I know about heart disease, diabetes, and high blood pressure? Blood pressure and heart disease High blood pressure causes heart disease and increases the risk of stroke. This is more likely to develop in people who have high blood pressure readings or are overweight. Talk with your health care provider about your target blood pressure readings. Have your blood pressure checked: Every 3-5 years if you are 18-39 years of age. Every year if you are 40 years old or older. If you are between the ages of 65 and 75 and are a current or former smoker, ask your health care provider if you should have a one-time screening for abdominal aortic aneurysm (AAA). Diabetes Have regular diabetes screenings. This checks your fasting blood sugar level. Have the screening done: Once every three years after age 45 if you are at a normal weight and have a low risk for diabetes. More often and at a younger age if you are overweight or have a high risk for diabetes. What should I know about preventing infection? Hepatitis B If you have a higher risk for hepatitis B, you should be screened for this virus. Talk with your health care provider to find out if you are at risk for hepatitis B infection. Hepatitis C Blood testing is recommended for: Everyone born from 1945 through 1965. Anyone with known risk factors for hepatitis C. Sexually transmitted infections (STIs) You should be screened each year for STIs, including gonorrhea and chlamydia, if: You are sexually active and are younger than 82 years of age. You are older than 82 years of age and your   health care provider tells you that you are at risk for this type of infection. Your sexual activity has changed since you were last screened, and you are at increased risk for chlamydia or gonorrhea. Ask your health care provider if you are at risk. Ask your health care provider about whether you are at high risk for HIV. Your health care provider  may recommend a prescription medicine to help prevent HIV infection. If you choose to take medicine to prevent HIV, you should first get tested for HIV. You should then be tested every 3 months for as long as you are taking the medicine. Follow these instructions at home: Alcohol use Do not drink alcohol if your health care provider tells you not to drink. If you drink alcohol: Limit how much you have to 0-2 drinks a day. Know how much alcohol is in your drink. In the [place] one drink equals one 12 oz bottle of beer (355 mL), one 5 oz glass of wine (148 mL), or one 1 oz glass of hard liquor (44 mL). Lifestyle Do not use any products that contain nicotine or tobacco. These products include cigarettes, chewing tobacco, and vaping devices, such as e-cigarettes. If you need help quitting, ask your health care provider. Do not use street drugs. Do not share needles. Ask your health care provider for help if you need support or information about quitting drugs. General instructions Schedule regular health, dental, and eye exams. Stay current with your vaccines. Tell your health care provider if: You often feel depressed. You have ever been abused or do not feel safe at home. Summary Adopting a healthy lifestyle and getting preventive care are important in promoting health and wellness. Follow your health care provider's instructions about healthy diet, exercising, and getting tested or screened for diseases. Follow your health care provider's instructions on monitoring your cholesterol and blood pressure. This information is not intended to replace advice given to you by your health care provider. Make sure you discuss any questions you have with your health care provider. Document Revised: 01/19/2021 Document Reviewed: 01/19/2021 Elsevier Patient Education  2023 Elsevier Inc.  

## 2022-09-23 NOTE — Progress Notes (Signed)
Subjective:    Patient ID: Joe Hardy, male    DOB: 06/26/1941, 82 y.o.   MRN: [id number]  HPI  Patient presents to clinic today for his annual exam. Of note, his BP today is 146/82. He is taking Lisinopril HCT as prescribed.  Flu: 05/2022 Tetanus: Unsure COVID: X 6 Pneumovax: 09/2012 Prevnar: 09/2013 Zostavax: 09/2012 Shingrix: Never PSA screening: 08/2021 Colon screening: no longer screening Vision screening: annually Dentist: dentures  Diet: He does not meat. He consume fruits and veggies. He does not eat fried foods. He drinks mostly water, herb tea, kombuca, juice Exercise: some walking  Review of Systems     Past Medical History:  Diagnosis Date   Depression    GERD (gastroesophageal reflux disease)    Hyperlipidemia    Hypertension    Migraine    Sleep apnea     Current Outpatient Medications  Medication Sig Dispense Refill   Ascorbic Acid (VITAMIN C) 1000 MG tablet Take 1,000 mg by mouth daily.     atorvastatin (LIPITOR) 20 MG tablet TAKE 1 TABLET BY MOUTH  DAILY AT 6PM 90 tablet 1   b complex vitamins tablet Take 1 tablet by mouth daily.     Blood Pressure Monitoring (SM WRIST CUFF BP MONITOR) MISC 1 Units by Does not apply route 2 (two) times a week. 1 each 0   buPROPion (WELLBUTRIN XL) 150 MG 24 hr tablet Take 450 mg by mouth daily.     busPIRone (BUSPAR) 10 MG tablet Take 1 tablet (10 mg total) by mouth 2 (two) times daily. 180 tablet 1   cholecalciferol (VITAMIN D3) 25 MCG (1000 UT) tablet Take 4,000 Units by mouth daily.      cyanocobalamin 1000 MCG tablet Take 1,000 mcg by mouth daily.     Flaxseed, Linseed, (FLAX SEEDS PO) Take by mouth.     GARLIC PO Take by mouth.     levothyroxine (SYNTHROID) 75 MCG tablet TAKE 1 TABLET BY MOUTH DAILY  BEFORE BREAKFAST 90 tablet 1   lisinopril-hydrochlorothiazide (ZESTORETIC) 10-12.5 MG tablet TAKE 1 TABLET BY MOUTH  DAILY 90 tablet 0   magnesium 30 MG tablet Take 400 mg by mouth daily.     mirtazapine  (REMERON) 30 MG tablet Take 30 mg by mouth at bedtime.     Multiple Vitamin (MULTIVITAMIN WITH MINERALS) TABS tablet Take 1 tablet by mouth daily.     Saw Palmetto 450 MG CAPS Take 450 mg by mouth daily.     terazosin (HYTRIN) 5 MG capsule TAKE 1 CAPSULE BY MOUTH DAILY 90 capsule 0   traZODone (DESYREL) 150 MG tablet Take 150 mg by mouth at bedtime.     TURMERIC PO Take 500 mg by mouth 2 (two) times daily.     No current facility-administered medications for this visit.    Allergies  Allergen Reactions   Fluoxetine     insomnia   Venlafaxine     difficulty sleeping    Family History  Problem Relation Age of Onset   Tuberculosis Mother    Asthma Mother    Emphysema Father    Heart attack Father     Social History   Socioeconomic History   Marital status: Divorced    Spouse name: Not on file   Number of children: Not on file   Years of education: Not on file   Highest education level: Bachelor's degree (e.g., BA, AB, BS)  Occupational History   Occupation: retired  Tobacco Use   Smoking  status: Former    Packs/day: 1.50    Years: 20.00    Total pack years: 30.00    Types: Cigarettes    Quit date: 05/02/2017    Years since quitting: 5.3   Smokeless tobacco: Former   Tobacco comments:    Starts and stops related to anxiety rather than drinking alcohol  Vaping Use   Vaping Use: Never used  Substance and Sexual Activity   Alcohol use: No    Alcohol/week: 0.0 standard drinks of alcohol    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)   Drug use: No   Sexual activity: Not Currently  Other Topics Concern   Not on file  Social History Narrative   Not on file   Social Determinants of Health   Financial Resource Strain: Low Risk  (10/09/2021)   Overall Financial Resource Strain (CARDIA)    Difficulty of Paying Living Expenses: Not hard at all  Food Insecurity: No Food Insecurity (10/09/2021)   Hunger Vital Sign    Worried About Running Out of Food in the Last  Year: Never true    [place] in the Last Year: Never true  Transportation Needs: No Transportation Needs (10/09/2021)   PRAPARE - Hydrologist (Medical): No    Lack of Transportation (Non-Medical): No  Physical Activity: Inactive (10/09/2021)   Exercise Vital Sign    Days of Exercise per Week: 0 days    Minutes of Exercise per Session: 0 min  Stress: No Stress Concern Present (10/09/2021)   [place]    Feeling of Stress : Not at all  Social Connections: Moderately Isolated (10/09/2021)   Social Connection and Isolation Panel [NHANES]    Frequency of Communication with Friends and Family: More than three times a week    Frequency of Social Gatherings with Friends and Family: More than three times a week    Attends Religious Services: Never    Marine scientist or Organizations: Yes    Attends Archivist Meetings: Never    Marital Status: Divorced  Human resources officer Violence: Not At Risk (10/09/2021)   Humiliation, Afraid, Rape, and Kick questionnaire    Fear of Current or Ex-Partner: No    Emotionally Abused: No    Physically Abused: No    Sexually Abused: No     Constitutional: Patient reports intermittent headaches.  Denies fever, malaise, fatigue, or abrupt weight changes.  HEENT: Denies eye pain, eye redness, ear pain, ringing in the ears, wax buildup, runny nose, nasal congestion, bloody nose, or sore throat. Respiratory: Denies difficulty breathing, shortness of breath, cough or sputum production.   Cardiovascular: Denies chest pain, chest tightness, palpitations or swelling in the hands or feet.  Gastrointestinal: Patient reports constipation.  Denies abdominal pain, bloating, diarrhea or blood in the stool.  GU: Denies urgency, frequency, pain with urination, burning sensation, blood in urine, odor or discharge. Musculoskeletal: Patient reports joint pain.  Denies  decrease in range of motion, difficulty with gait, muscle pain or joint swelling.  Skin: Denies redness, rashes, lesions or ulcercations.  Neurological: Patient reports insomnia, difficulty with balance.  Denies dizziness, difficulty with memory, difficulty with speech or problems with coordination.  Psych: Patient has a history of depression.  Denies anxiety, SI/HI.  No other specific complaints in a complete review of systems (except as listed in HPI above).  Objective:   Physical Exam  BP (!) 146/82 (BP Location:  Right Arm, Patient Position: Sitting, Cuff Size: Normal)   Pulse 87   Temp (!) 97.1 F (36.2 C) (Temporal)   Ht 5' 9.5" (1.765 m)   Wt 180 lb (81.6 kg)   SpO2 97%   BMI 26.20 kg/m   Wt Readings from Last 3 Encounters:  06/03/22 186 lb (84.4 kg)  04/28/22 181 lb 3.2 oz (82.2 kg)  03/03/22 176 lb (79.8 kg)    General: Appears his stated age, overweight, in NAD. Skin: Warm, dry and intact. Rash noted of face (chronic). HEENT: Head: normal shape and size; Eyes: sclera white, no icterus, conjunctiva pink, PERRLA and EOMs intact; Neck:  Neck supple, trachea midline. No masses, lumps or thyromegaly present.  Cardiovascular: Normal rate and rhythm. S1,S2 noted.  No murmur, rubs or gallops noted. No JVD or BLE edema. No carotid bruits noted. Pulmonary/Chest: Normal effort and positive vesicular breath sounds. No respiratory distress. No wheezes, rales or ronchi noted.  Abdomen: Normal bowel sounds.  Musculoskeletal: Kyphotic. Strength 5/5 BUE/BLE. No difficulty with gait.  Neurological: Alert and oriented. Cranial nerves II-XII grossly intact. Coordination normal.  Psychiatric: Mood and affect normal. Behavior is normal. Judgment and thought content normal.     BMET    Component Value Date/Time   NA 139 03/03/2022 1003   NA 140 04/20/2017 0925   K 3.7 03/03/2022 1003   CL 105 03/03/2022 1003   CO2 25 03/03/2022 1003   GLUCOSE 92 03/03/2022 1003   BUN 14 03/03/2022  1003   BUN 9 04/20/2017 0925   CREATININE 1.11 03/03/2022 1003   CALCIUM 9.6 03/03/2022 1003   GFRNONAA 85 03/09/2021 1119   GFRAA 98 03/09/2021 1119    Lipid Panel     Component Value Date/Time   CHOL 120 03/03/2022 1003   CHOL 113 04/20/2017 0925   TRIG 64 03/03/2022 1003   HDL 42 03/03/2022 1003   HDL 26 (L) 04/20/2017 0925   CHOLHDL 2.9 03/03/2022 1003   VLDL 24 08/17/2016 0001   LDLCALC 64 03/03/2022 1003    CBC    Component Value Date/Time   WBC 4.5 06/03/2022 1000   RBC 5.80 06/03/2022 1000   HGB 13.1 (L) 06/03/2022 1000   HGB 13.0 04/22/2015 0818   HCT 41.6 06/03/2022 1000   HCT 38.5 04/22/2015 0818   PLT 255 06/03/2022 1000   PLT 257 04/22/2015 0818   MCV 71.7 (L) 06/03/2022 1000   MCV 69 (L) 04/22/2015 0818   MCH 22.6 (L) 06/03/2022 1000   MCHC 31.5 (L) 06/03/2022 1000   RDW 16.3 (H) 06/03/2022 1000   RDW 16.0 (H) 04/22/2015 0818   LYMPHSABS 1,503 06/03/2022 1000   LYMPHSABS 2.4 04/22/2015 0818   MONOABS 630 08/17/2016 0001   EOSABS 99 06/03/2022 1000   EOSABS 0.2 04/22/2015 0818   BASOSABS 32 06/03/2022 1000   BASOSABS 0.0 04/22/2015 0818    Hgb A1C Lab Results  Component Value Date   HGBA1C 5.4 08/25/2021            Assessment & Plan:   Preventative Health Maintenance:  Flu shot UTD He declines tetanus for financial reasons, advised him if he gets better To go get this done at the pharmacy Pneumovax and Prevnar 13 UTD Prevnar 20 today Zostavax UTD, he declines Shingrix He does not want to screen for colon cancer given his age Encouraged him to consume a balanced diet and exercise regimen Advised him to see an eye doctor and dentist annually We will check CBC,  c-Met, TSH, free T4, lipid, A1c and PSA today  RTC in 6 months, follow-up chronic conditions Webb Silversmith, NP

## 2022-09-23 NOTE — Assessment & Plan Note (Signed)
Encouraged diet and exercise for weight loss ?

## 2022-09-23 NOTE — Addendum Note (Signed)
Addended by: Ashley Royalty E on: 09/23/2022 01:56 PM   Modules accepted: Orders

## 2022-09-24 ENCOUNTER — Telehealth: Payer: Self-pay | Admitting: Internal Medicine

## 2022-09-24 LAB — LIPID PANEL
Cholesterol: 196 mg/dL (ref <200)
HDL: 53 mg/dL (ref >=40)
LDL Cholesterol (Calc): 128 mg/dL (calc) — ABNORMAL HIGH
Non-HDL Cholesterol (Calc): 143 mg/dL (calc) — ABNORMAL HIGH (ref <130)
Total CHOL/HDL Ratio: 3.7 (calc) (ref <5.0)
Triglycerides: 56 mg/dL (ref <150)

## 2022-09-24 LAB — COMPLETE METABOLIC PANEL WITH GFR
AG Ratio: 2.1 (calc) (ref 1.0–2.5)
ALT: 13 U/L (ref 9–46)
AST: 19 U/L (ref 10–35)
Albumin: 4.4 g/dL (ref 3.6–5.1)
Alkaline phosphatase (APISO): 40 U/L (ref 35–144)
BUN: 17 mg/dL (ref 7–25)
CO2: 26 mmol/L (ref 20–32)
Calcium: 9.9 mg/dL (ref 8.6–10.3)
Chloride: 105 mmol/L (ref 98–110)
Creat: 1.04 mg/dL (ref 0.70–1.22)
Globulin: 2.1 g/dL (calc) (ref 1.9–3.7)
Glucose, Bld: 96 mg/dL (ref 65–99)
Potassium: 3.9 mmol/L (ref 3.5–5.3)
Sodium: 140 mmol/L (ref 135–146)
Total Bilirubin: 0.5 mg/dL (ref 0.2–1.2)
Total Protein: 6.5 g/dL (ref 6.1–8.1)
eGFR: 72 mL/min/{1.73_m2} (ref >=60)

## 2022-09-24 LAB — CBC
HCT: 43 % (ref 38.5–50.0)
Hemoglobin: 13.8 g/dL (ref 13.2–17.1)
MCH: 22.7 pg — ABNORMAL LOW (ref 27.0–33.0)
MCHC: 32.1 g/dL (ref 32.0–36.0)
MCV: 70.8 fL — ABNORMAL LOW (ref 80.0–100.0)
MPV: 10.2 fL (ref 7.5–12.5)
Platelets: 244 10*3/uL (ref 140–400)
RBC: 6.07 10*6/uL — ABNORMAL HIGH (ref 4.20–5.80)
RDW: 16.3 % — ABNORMAL HIGH (ref 11.0–15.0)
WBC: 5.2 10*3/uL (ref 3.8–10.8)

## 2022-09-24 LAB — HEMOGLOBIN A1C
Hgb A1c MFr Bld: 5.4 % of total Hgb (ref <5.7)
Mean Plasma Glucose: 108 mg/dL
eAG (mmol/L): 6 mmol/L

## 2022-09-24 LAB — PSA: PSA: 1.99 ng/mL (ref <=4.00)

## 2022-09-24 LAB — TSH: TSH: 2.78 mIU/L (ref 0.40–4.50)

## 2022-09-24 LAB — T4, FREE: Free T4: 1.3 ng/dL (ref 0.8–1.8)

## 2022-09-24 NOTE — Telephone Encounter (Signed)
Line busy unable to leave a message for patient to call back and schedule the Medicare Annual Wellness Visit (AWV) virtually or by telephone.  Last AWV 10/09/21  Please schedule at anytime with [place].  30 minute appointment  Any questions, please call me at [phone]

## 2022-09-27 ENCOUNTER — Telehealth: Payer: Self-pay

## 2022-09-27 NOTE — Telephone Encounter (Signed)
noted 

## 2022-09-27 NOTE — Telephone Encounter (Signed)
Copied from [place] [id number]. Topic: General - Other >> Sep 27, 2022  9:28 AM Everette C wrote: Reason for CRM: The patient would like to be contacted by a member of staff when possible regarding their recent labs  Please contact when available

## 2022-09-27 NOTE — Telephone Encounter (Signed)
Pt advised.  He has not been taking his cholesterol medications but will restart.   Thanks,   -Mickel Baas

## 2022-09-28 DIAGNOSIS — M25561 Pain in right knee: Secondary | ICD-10-CM | POA: Diagnosis not present

## 2022-09-28 DIAGNOSIS — M25511 Pain in right shoulder: Secondary | ICD-10-CM | POA: Diagnosis not present

## 2022-10-04 DIAGNOSIS — M25561 Pain in right knee: Secondary | ICD-10-CM | POA: Diagnosis not present

## 2022-10-04 DIAGNOSIS — M25511 Pain in right shoulder: Secondary | ICD-10-CM | POA: Diagnosis not present

## 2022-10-24 ENCOUNTER — Other Ambulatory Visit: Payer: Self-pay | Admitting: Internal Medicine

## 2022-10-24 DIAGNOSIS — I10 Essential (primary) hypertension: Secondary | ICD-10-CM

## 2022-10-26 ENCOUNTER — Encounter: Payer: Self-pay | Admitting: Internal Medicine

## 2022-10-26 ENCOUNTER — Emergency Department
Admission: EM | Admit: 2022-10-26 | Discharge: 2022-10-26 | Disposition: A | Discharge status: Home / Self Care | Payer: 59 | Attending: Emergency Medicine | Admitting: Emergency Medicine

## 2022-10-26 ENCOUNTER — Ambulatory Visit (INDEPENDENT_AMBULATORY_CARE_PROVIDER_SITE_OTHER): Payer: 59 | Admitting: Internal Medicine

## 2022-10-26 ENCOUNTER — Encounter: Payer: Self-pay | Admitting: Emergency Medicine

## 2022-10-26 ENCOUNTER — Ambulatory Visit: Payer: Self-pay

## 2022-10-26 ENCOUNTER — Other Ambulatory Visit: Payer: Self-pay

## 2022-10-26 VITALS — BP 172/102 | HR 104 | Temp 96.8°F | Wt 173.0 lb

## 2022-10-26 DIAGNOSIS — K5900 Constipation, unspecified: Secondary | ICD-10-CM | POA: Diagnosis not present

## 2022-10-26 DIAGNOSIS — R310 Gross hematuria: Secondary | ICD-10-CM | POA: Diagnosis not present

## 2022-10-26 DIAGNOSIS — N401 Enlarged prostate with lower urinary tract symptoms: Secondary | ICD-10-CM

## 2022-10-26 DIAGNOSIS — R339 Retention of urine, unspecified: Secondary | ICD-10-CM | POA: Diagnosis not present

## 2022-10-26 DIAGNOSIS — R350 Frequency of micturition: Secondary | ICD-10-CM

## 2022-10-26 DIAGNOSIS — I10 Essential (primary) hypertension: Secondary | ICD-10-CM

## 2022-10-26 DIAGNOSIS — K5901 Slow transit constipation: Secondary | ICD-10-CM

## 2022-10-26 DIAGNOSIS — R338 Other retention of urine: Secondary | ICD-10-CM | POA: Diagnosis present

## 2022-10-26 LAB — URINALYSIS, ROUTINE W REFLEX MICROSCOPIC
Bacteria, UA: NONE SEEN
Bilirubin Urine: NEGATIVE
Glucose, UA: NEGATIVE mg/dL
Ketones, ur: 5 mg/dL — AB
Nitrite: NEGATIVE
Protein, ur: 30 mg/dL — AB
RBC / HPF: >50 RBC/hpf (ref 0–5)
Specific Gravity, Urine: 1.01 (ref 1.005–1.030)
pH: 7 (ref 5.0–8.0)

## 2022-10-26 MED ORDER — SENNOSIDES-DOCUSATE SODIUM 8.6-50 MG PO TABS: 1.0000 | ORAL_TABLET | Freq: Every day | ORAL | 0 refills | Status: DC

## 2022-10-26 MED ORDER — LIDOCAINE HCL URETHRAL/MUCOSAL 2 % EX GEL
1.0000 | Freq: Once | CUTANEOUS | Status: AC
  Administered 2022-10-26: 1 via URETHRAL
  Filled 2022-10-26: qty 6

## 2022-10-26 NOTE — ED Provider Notes (Signed)
[facility] Provider Note  Patient Contact: 6:37 PM (approximate)   History   Urinary Retention   HPI  Joe Hardy is a 82 y.o. male who presents emerged part for urinary retention.  Patient states that he has a history of BPH and has had retention in the past.  He states that he had a feeling pressure and been unable to urinate.  No dysuria, polyuria, hematuria or flank pain prior to this onset.  Patient states he struggles with constipation as well and would like some additional medications to help with chronic constipation.  No abdominal pain.     Physical Exam   Triage Vital Signs: ED Triage Vitals  Enc Vitals Group     BP 10/26/22 1715 (!) 168/100     Pulse Rate 10/26/22 1715 100     Resp 10/26/22 1715 18     Temp 10/26/22 1715 98.7 F (37.1 C)     Temp Source 10/26/22 1715 Oral     SpO2 10/26/22 1715 94 %     Weight 10/26/22 1709 171 lb 15.3 oz (78 kg)     Height 10/26/22 1808 5' 9"$  (1.753 m)     Head Circumference --      Peak Flow --      Pain Score 10/26/22 1709 6     Pain Loc --      Pain Edu? --      Excl. in [place]? --     Most recent vital signs: Vitals:   10/26/22 1715  BP: (!) 168/100  Pulse: 100  Resp: 18  Temp: 98.7 F (37.1 C)  SpO2: 94%     General: Alert and in no acute distress.  Cardiovascular:  Good peripheral perfusion Respiratory: Normal respiratory effort without tachypnea or retractions. Lungs CTAB.  Gastrointestinal: Bowel sounds 4 quadrants. Soft and nontender to palpation. No guarding or rigidity. No palpable masses. No distention. No CVA tenderness.  Bladder scan reveals retained 745 mL of urine Musculoskeletal: Full range of motion to all extremities.  Neurologic:  No gross focal neurologic deficits are appreciated.  Skin:   No rash noted Other:   ED Results / Procedures / Treatments   Labs (all labs ordered are listed, but only abnormal results are displayed) Labs Reviewed  URINALYSIS, ROUTINE  W REFLEX MICROSCOPIC     EKG     RADIOLOGY    No results found.  PROCEDURES:  Critical Care performed: No  Procedures   MEDICATIONS ORDERED IN ED: Medications  lidocaine (XYLOCAINE) 2 % jelly 1 Application (has no administration in time range)     IMPRESSION / MDM / ASSESSMENT AND PLAN / ED COURSE  I reviewed the triage vital signs and the nursing notes.                                 Differential diagnosis includes, but is not limited to, UTI, urinary retention, prostatitis, BPH, prostate cancer, constipation  Patient's presentation is most consistent with acute presentation with potential threat to life or bodily function.   Patient's diagnosis is consistent with BPH, urinary retention, constipation.  Patient presents emergency department chronic constipation but is primarily here for urinary retention.  He has had this happen in the past secondary to prostate issues.  Patient had no urinary changes prior to the onset of retention.  At this time have a lower suspicion for UTI do recommend urinalysis after catheter  is placed for urinary retention.  Patient states that he is on a tight time schedule with his ride and cannot wait for the urinalysis to return.  As such we will send the urinalysis and if he has gross signs of infection I can call him and prescription for antibiotics later.  Patient is agreeable with this plan.  Ultimately he will follow-up with urology to discuss his BPH and urinary retention.  Indwelling catheter instructions given to the patient.  Again follow-up with urology.  Return precautions discussed with the patient..  Patient is given ED precautions to return to the ED for any worsening or new symptoms.     FINAL CLINICAL IMPRESSION(S) / ED DIAGNOSES   Final diagnoses:  Urinary retention  Benign prostatic hyperplasia with urinary retention  Slow transit constipation     Rx / DC Orders   ED Discharge Orders          Ordered     senna-docusate (SENOKOT-S) 8.6-50 MG tablet  Daily        10/26/22 1852             Note:  This document was prepared using Dragon voice recognition software and may include unintentional dictation errors.   Brynda Peon 10/26/22 1912    Rada Hay, MD 10/26/22 2005

## 2022-10-26 NOTE — Patient Instructions (Signed)
Benign Prostatic Hyperplasia ? ?Benign prostatic hyperplasia (BPH) is an enlarged prostate gland that is caused by the normal aging process. The prostate may get bigger as a man gets older. The condition is not caused by cancer. The prostate is a walnut-sized gland that is involved in the production of semen. It is located in front of the rectum and below the bladder. The bladder stores urine. The urethra carries stored urine out of the body. ?An enlarged prostate can press on the urethra. This can make it harder to pass urine. The buildup of urine in the bladder can cause infection. Back pressure and infection may progress to bladder damage and kidney (renal) failure. ?What are the causes? ?This condition is part of the normal aging process. However, not all men develop problems from this condition. If the prostate enlarges away from the urethra, urine flow will not be blocked. If it enlarges toward the urethra and compresses it, there will be problems passing urine. ?What increases the risk? ?This condition is more likely to develop in men older than 50 years. ?What are the signs or symptoms? ?Symptoms of this condition include: ?Getting up often during the night to urinate. ?Needing to urinate frequently during the day. ?Difficulty starting urine flow. ?Decrease in size and strength of your urine stream. ?Leaking (dribbling) after urinating. ?Inability to pass urine. This needs immediate treatment. ?Inability to completely empty your bladder. ?Pain when you pass urine. This is more common if there is also an infection. ?Urinary tract infection (UTI). ?How is this diagnosed? ?This condition is diagnosed based on your medical history, a physical exam, and your symptoms. Tests will also be done, such as: ?A post-void bladder scan. This measures any amount of urine that may remain in your bladder after you finish urinating. ?A digital rectal exam. In a rectal exam, your health care provider checks your prostate by  putting a lubricated, gloved finger into your rectum to feel the back of your prostate gland. This exam detects the size of your gland and any abnormal lumps or growths. ?An exam of your urine (urinalysis). ?A prostate specific antigen (PSA) screening. This is a blood test used to screen for prostate cancer. ?An ultrasound. This test uses sound waves to electronically produce a picture of your prostate gland. ?Your health care provider may refer you to a specialist in kidney and prostate diseases (urologist). ?How is this treated? ?Once symptoms begin, your health care provider will monitor your condition (active surveillance or watchful waiting). Treatment for this condition will depend on the severity of your condition. Treatment may include: ?Observation and yearly exams. This may be the only treatment needed if your condition and symptoms are mild. ?Medicines to relieve your symptoms, including: ?Medicines to shrink the prostate. ?Medicines to relax the muscle of the prostate. ?Surgery in severe cases. Surgery may include: ?Prostatectomy. In this procedure, the prostate tissue is removed completely through an open incision or with a laparoscope or robotics. ?Transurethral resection of the prostate (TURP). In this procedure, a tool is inserted through the opening at the tip of the penis (urethra). It is used to cut away tissue of the inner core of the prostate. The pieces are removed through the same opening of the penis. This removes the blockage. ?Transurethral incision (TUIP). In this procedure, small cuts are made in the prostate. This lessens the prostate's pressure on the urethra. ?Transurethral microwave thermotherapy (TUMT). This procedure uses microwaves to create heat. The heat destroys and removes a small amount of   prostate tissue. ?Transurethral needle ablation (TUNA). This procedure uses radio frequencies to destroy and remove a small amount of prostate tissue. ?Interstitial laser coagulation (ILC).  This procedure uses a laser to destroy and remove a small amount of prostate tissue. ?Transurethral electrovaporization (TUVP). This procedure uses electrodes to destroy and remove a small amount of prostate tissue. ?Prostatic urethral lift. This procedure inserts an implant to push the lobes of the prostate away from the urethra. ?Follow these instructions at home: ?Take over-the-counter and prescription medicines only as told by your health care provider. ?Monitor your symptoms for any changes. Contact your health care provider with any changes. ?Avoid drinking large amounts of liquid before going to bed or out in public. ?Avoid or reduce how much caffeine or alcohol you drink. ?Give yourself time when you urinate. ?Keep all follow-up visits. This is important. ?Contact a health care provider if: ?You have unexplained back pain. ?Your symptoms do not get better with treatment. ?You develop side effects from the medicine you are taking. ?Your urine becomes very dark or has a bad smell. ?Your lower abdomen becomes distended and you have trouble passing urine. ?Get help right away if: ?You have a fever or chills. ?You suddenly cannot urinate. ?You feel light-headed or very dizzy, or you faint. ?There are large amounts of blood or clots in your urine. ?Your urinary problems become hard to manage. ?You develop moderate to severe low back or flank pain. The flank is the side of your body between the ribs and the hip. ?These symptoms may be an emergency. Get help right away. Call 911. ?Do not wait to see if the symptoms will go away. ?Do not drive yourself to the hospital. ?Summary ?Benign prostatic hyperplasia (BPH) is an enlarged prostate that is caused by the normal aging process. It is not caused by cancer. ?An enlarged prostate can press on the urethra. This can make it hard to pass urine. ?This condition is more likely to develop in men older than 50 years. ?Get help right away if you suddenly cannot urinate. ?This  information is not intended to replace advice given to you by your health care provider. Make sure you discuss any questions you have with your health care provider. ?Document Revised: 03/18/2021 Document Reviewed: 03/18/2021 ?Elsevier Patient Education ? 2023 Elsevier Inc[employer] ? ?

## 2022-10-26 NOTE — ED Notes (Signed)
Pt bladder scanned for 732m.

## 2022-10-26 NOTE — ED Triage Notes (Signed)
Pt sts that he has been having urinary retention. Pt sts that he does urinate but he is not going like he normally does.

## 2022-10-26 NOTE — Telephone Encounter (Addendum)
     Chief Complaint: Urinary frequency and blood in urine, weak. Has afternoon appointment, asking to be seen sooner if possible. Symptoms: Above Frequency: 3-4 days Pertinent Negatives: Patient denies fever Disposition: '[]'$ ED /'[]'$ Urgent Care (no appt availability in office) / '[x]'$ Appointment(In office/virtual)/ '[]'$  [place] [facility]/ '[]'$ Home Care/ '[]'$ Refused Recommended Disposition /'[]'$ [place] [facility]/ '[]'$  Follow-up with PCP Additional Notes: Go to ED  for worsening of symptoms. Reason for Disposition  Pain or burning with passing urine  Answer Assessment - Initial Assessment Questions 1. COLOR of URINE: "Describe the color of the urine."  (e.g., tea-colored, pink, red, bloody) "Do you have blood clots in your urine?" (e.g., none, pea, grape, small coin)     Dark red 2. ONSET: "When did the bleeding start?"      3-4 days  3. EPISODES: "How many times has there been blood in the urine?" or "How many times today?"     Sporadic 4. PAIN with URINATION: "Is there any pain with passing your urine?" If Yes, ask: "How bad is the pain?"  (Scale 1-10; or mild, moderate, severe)    - MILD: Complains slightly about urination hurting.    - MODERATE: Interferes with normal activities.      - SEVERE: Excruciating, unwilling or unable to urinate because of the pain.      Moderate 5. FEVER: "Do you have a fever?" If Yes, ask: "What is your temperature, how was it measured, and when did it start?"     No 6. ASSOCIATED SYMPTOMS: "Are you passing urine more frequently than usual?"     Yes 7. OTHER SYMPTOMS: "Do you have any other symptoms?" (e.g., back/flank pain, abdomen pain, vomiting)     Straining 8. PREGNANCY: "Is there any chance you are pregnant?" "When was your last menstrual period?"     N/a  Protocols used: Urine - Blood In-A-AH

## 2022-10-26 NOTE — Progress Notes (Signed)
Subjective:    Patient ID: Joe Hardy, male    DOB: 25-Jun-1941, 82 y.o.   MRN: [id number]  HPI  Patient presents to clinic today with complaint of urinary retention, pressure in his penis and blood in his urine.  He noticed this yesterday.  He is on Terazosin for BPH but admits he has not taken this in the last 2 days.Marland Kitchen PSA from 09/2022 was normal.  He has no history of kidney stones but he had a lumbar x-ray from 2016 that showed a possible right kidney stone.  Of note, his BP today is 162/98.  He is taking Lisinopril HCT as prescribed but admits he has not taken this in the last 2 days because he has not felt well.  Review of Systems     Past Medical History:  Diagnosis Date   Depression    GERD (gastroesophageal reflux disease)    Hyperlipidemia    Hypertension    Migraine    Sleep apnea     Current Outpatient Medications  Medication Sig Dispense Refill   Ascorbic Acid (VITAMIN C) 1000 MG tablet Take 1,000 mg by mouth daily.     atorvastatin (LIPITOR) 20 MG tablet TAKE 1 TABLET BY MOUTH  DAILY AT 6PM 90 tablet 1   b complex vitamins tablet Take 1 tablet by mouth daily.     Blood Pressure Monitoring (SM WRIST CUFF BP MONITOR) MISC 1 Units by Does not apply route 2 (two) times a week. 1 each 0   buPROPion (WELLBUTRIN XL) 150 MG 24 hr tablet Take 450 mg by mouth daily.     busPIRone (BUSPAR) 10 MG tablet Take 1 tablet (10 mg total) by mouth 2 (two) times daily. 180 tablet 1   cholecalciferol (VITAMIN D3) 25 MCG (1000 UT) tablet Take 4,000 Units by mouth daily.      cyanocobalamin 1000 MCG tablet Take 1,000 mcg by mouth daily.     Flaxseed, Linseed, (FLAX SEEDS PO) Take by mouth.     GARLIC PO Take by mouth.     levothyroxine (SYNTHROID) 75 MCG tablet TAKE 1 TABLET BY MOUTH DAILY  BEFORE BREAKFAST 90 tablet 1   lisinopril-hydrochlorothiazide (ZESTORETIC) 10-12.5 MG tablet TAKE 1 TABLET BY MOUTH DAILY 90 tablet 1   magnesium 30 MG tablet Take 400 mg by mouth daily.      meloxicam (MOBIC) 15 MG tablet Take 15 mg by mouth daily.     mirtazapine (REMERON) 30 MG tablet Take 30 mg by mouth at bedtime.     Multiple Vitamin (MULTIVITAMIN WITH MINERALS) TABS tablet Take 1 tablet by mouth daily.     Saw Palmetto 450 MG CAPS Take 450 mg by mouth daily.     terazosin (HYTRIN) 5 MG capsule TAKE 1 CAPSULE BY MOUTH DAILY 90 capsule 0   traZODone (DESYREL) 150 MG tablet Take 150 mg by mouth at bedtime.     TURMERIC PO Take 500 mg by mouth 2 (two) times daily.     No current facility-administered medications for this visit.    Allergies  Allergen Reactions   Fluoxetine     insomnia   Venlafaxine     difficulty sleeping    Family History  Problem Relation Age of Onset   Tuberculosis Mother    Asthma Mother    Emphysema Father    Heart attack Father     Social History   Socioeconomic History   Marital status: Divorced    Spouse name: Not on file  Number of children: Not on file   Years of education: Not on file   Highest education level: Bachelor's degree (e.g., BA, AB, BS)  Occupational History   Occupation: retired  Tobacco Use   Smoking status: Former    Packs/day: 1.50    Years: 20.00    Total pack years: 30.00    Types: Cigarettes    Quit date: 05/02/2017    Years since quitting: 5.4   Smokeless tobacco: Former   Tobacco comments:    Starts and stops related to anxiety rather than drinking alcohol  Vaping Use   Vaping Use: Never used  Substance and Sexual Activity   Alcohol use: No    Alcohol/week: 0.0 standard drinks of alcohol    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)   Drug use: No   Sexual activity: Not Currently  Other Topics Concern   Not on file  Social History Narrative   Not on file   Social Determinants of Health   Financial Resource Strain: Low Risk  (10/09/2021)   Overall Financial Resource Strain (CARDIA)    Difficulty of Paying Living Expenses: Not hard at all  Food Insecurity: No Food Insecurity (10/09/2021)    Hunger Vital Sign    Worried About Running Out of Food in the Last Year: Never true    [place] in the Last Year: Never true  Transportation Needs: No Transportation Needs (10/09/2021)   PRAPARE - Hydrologist (Medical): No    Lack of Transportation (Non-Medical): No  Physical Activity: Inactive (10/09/2021)   Exercise Vital Sign    Days of Exercise per Week: 0 days    Minutes of Exercise per Session: 0 min  Stress: No Stress Concern Present (10/09/2021)   [place]    Feeling of Stress : Not at all  Social Connections: Moderately Isolated (10/09/2021)   Social Connection and Isolation Panel [NHANES]    Frequency of Communication with Friends and Family: More than three times a week    Frequency of Social Gatherings with Friends and Family: More than three times a week    Attends Religious Services: Never    Marine scientist or Organizations: Yes    Attends Archivist Meetings: Never    Marital Status: Divorced  Human resources officer Violence: Not At Risk (10/09/2021)   Humiliation, Afraid, Rape, and Kick questionnaire    Fear of Current or Ex-Partner: No    Emotionally Abused: No    Physically Abused: No    Sexually Abused: No     Constitutional: Denies fever, malaise, fatigue, headache or abrupt weight changes.  Respiratory: Denies difficulty breathing, shortness of breath, cough or sputum production.   Cardiovascular: Denies chest pain, chest tightness, palpitations or swelling in the hands or feet.  Gastrointestinal: Denies abdominal pain, bloating, constipation, diarrhea or blood in the stool.  GU: Patient reports urinary retention, penile pain, blood in urine.  Denies urgency, frequency, pain with urination, burning sensation, odor or discharge. Musculoskeletal: Denies decrease in range of motion, difficulty with gait, muscle pain or joint pain and swelling.  Skin:  Denies redness, rashes, lesions or ulcercations.  Neurological: Denies dizziness, difficulty with memory, difficulty with speech or problems with balance and coordination.  Psych: Denies anxiety, depression, SI/HI.  No other specific complaints in a complete review of systems (except as listed in HPI above).  Objective:   Physical Exam  BP (!) 172/102 (BP  Location: Left Arm, Patient Position: Sitting, Cuff Size: Normal)   Pulse (!) 104   Temp (!) 96.8 F (36 C) (Temporal)   Wt 173 lb (78.5 kg)   SpO2 93%   BMI 25.18 kg/m   Wt Readings from Last 3 Encounters:  09/23/22 180 lb (81.6 kg)  06/03/22 186 lb (84.4 kg)  04/28/22 181 lb 3.2 oz (82.2 kg)    General: Appears his stated age, appears unwell but in NAD. ENT: Oral mucosa dry. Cardiovascular: Tachcyardic with normal rhythm. S1,S2 noted.  No murmur, rubs or gallops noted. No JVD or BLE edema. No carotid bruits noted. Pulmonary/Chest: Normal effort and positive vesicular breath sounds. No respiratory distress. No wheezes, rales or ronchi noted.  Abdomen: Soft and tender. Normal bowel sounds. No CVA tenderness noted. Musculoskeletal: He walks slouched over, gait slow and steady without device. Neurological: Alert and oriented.     BMET    Component Value Date/Time   NA 140 09/23/2022 1012   NA 140 04/20/2017 0925   K 3.9 09/23/2022 1012   CL 105 09/23/2022 1012   CO2 26 09/23/2022 1012   GLUCOSE 96 09/23/2022 1012   BUN 17 09/23/2022 1012   BUN 9 04/20/2017 0925   CREATININE 1.04 09/23/2022 1012   CALCIUM 9.9 09/23/2022 1012   GFRNONAA 85 03/09/2021 1119   GFRAA 98 03/09/2021 1119    Lipid Panel     Component Value Date/Time   CHOL 196 09/23/2022 1012   CHOL 113 04/20/2017 0925   TRIG 56 09/23/2022 1012   HDL 53 09/23/2022 1012   HDL 26 (L) 04/20/2017 0925   CHOLHDL 3.7 09/23/2022 1012   VLDL 24 08/17/2016 0001   LDLCALC 128 (H) 09/23/2022 1012    CBC    Component Value Date/Time   WBC 5.2 09/23/2022  1012   RBC 6.07 (H) 09/23/2022 1012   HGB 13.8 09/23/2022 1012   HGB 13.0 04/22/2015 0818   HCT 43.0 09/23/2022 1012   HCT 38.5 04/22/2015 0818   PLT 244 09/23/2022 1012   PLT 257 04/22/2015 0818   MCV 70.8 (L) 09/23/2022 1012   MCV 69 (L) 04/22/2015 0818   MCH 22.7 (L) 09/23/2022 1012   MCHC 32.1 09/23/2022 1012   RDW 16.3 (H) 09/23/2022 1012   RDW 16.0 (H) 04/22/2015 0818   LYMPHSABS 1,503 06/03/2022 1000   LYMPHSABS 2.4 04/22/2015 0818   MONOABS 630 08/17/2016 0001   EOSABS 99 06/03/2022 1000   EOSABS 0.2 04/22/2015 0818   BASOSABS 32 06/03/2022 1000   BASOSABS 0.0 04/22/2015 0818    Hgb A1C Lab Results  Component Value Date   HGBA1C 5.4 09/23/2022           Assessment & Plan:   Urinary Retention, Blood in Urine, BPH:  He has drank 5 bottles of water during the office visit and still unable to urinate Unable to obtain urinalysis Given urinary retention, possible dehydration recommend ER at this time for further evalaution  HTN:  He did not take his medication for last 2 days due to feeling unwell Discussed the importance of medication compliance Continue Lisinopril HCT as previously prescribed  RTC in 5 months for follow-up of chronic conditions Webb Silversmith, NP

## 2022-10-26 NOTE — ED Notes (Signed)
Coude foley catheter placed, pt tolerated. 1018m of urine output present after catheterization. Urine is dark amber in color and clear.

## 2022-10-27 ENCOUNTER — Ambulatory Visit: Payer: Self-pay

## 2022-10-27 NOTE — Telephone Encounter (Signed)
  Chief Complaint: advice Symptoms: L knee swelling and has some questions regarding catheter staps Frequency: today Pertinent Negatives: Patient denies pain or redness or issues with catheter Disposition: '[]'$ ED /'[]'$ Urgent Care (no appt availability in office) / '[]'$ Appointment(In office/virtual)/ '[]'$  [place] [facility]/ '[x]'$ Home Care/ '[]'$ Refused Recommended Disposition /'[]'$ [place] [facility]/ '[]'$  Follow-up with PCP Additional Notes: pt wanted to know if the leg straps needed to go over or under the tubing of urinary catheter, pt states he emptied the bag this morning that was almost full and then has been up for approx 1.5 hrs and already 1/4 full. Advised pt that it shouldn't matter about the straps as long as tubing is secure and the straps aren't tight to cause circulation issues. Pt states he feels like the swelling in L calf is d/t arthritis since he has knee problems and it comes and goes at times. Pt was going to call Urology to schedule FU appt and then pt wanted to make sure as fas as daily tasks he could still do as normal. Advised pt as long as no issues with catheter he should be fine to go as normal to CB if having any problems. Pt verbalized understanding.   Summary: Cathter Advice   Pt is calling to get advice regarding cathter. Regarding the straps. Please advise         Reason for Disposition  Nursing judgment  Protocols used: No Guideline or Reference Available-A-AH

## 2022-10-28 ENCOUNTER — Emergency Department
Admission: EM | Admit: 2022-10-28 | Discharge: 2022-10-28 | Disposition: A | Discharge status: Home / Self Care | Payer: 59 | Attending: Emergency Medicine | Admitting: Emergency Medicine

## 2022-10-28 ENCOUNTER — Other Ambulatory Visit: Payer: Self-pay

## 2022-10-28 DIAGNOSIS — M542 Cervicalgia: Secondary | ICD-10-CM | POA: Diagnosis not present

## 2022-10-28 DIAGNOSIS — I10 Essential (primary) hypertension: Secondary | ICD-10-CM | POA: Insufficient documentation

## 2022-10-28 DIAGNOSIS — R339 Retention of urine, unspecified: Secondary | ICD-10-CM | POA: Diagnosis present

## 2022-10-28 DIAGNOSIS — T83091A Other mechanical complication of indwelling urethral catheter, initial encounter: Secondary | ICD-10-CM | POA: Diagnosis not present

## 2022-10-28 DIAGNOSIS — S161XXA Strain of muscle, fascia and tendon at neck level, initial encounter: Secondary | ICD-10-CM

## 2022-10-28 MED ORDER — TRAMADOL HCL 50 MG PO TABS: 50.0000 mg | ORAL_TABLET | Freq: Four times a day (QID) | ORAL | 0 refills | Status: DC | PRN

## 2022-10-28 MED ORDER — TRAMADOL HCL 50 MG PO TABS: 50.0000 mg | ORAL_TABLET | Freq: Once | ORAL | Status: DC

## 2022-10-28 MED ORDER — ACETAMINOPHEN 500 MG PO TABS
1000.0000 mg | ORAL_TABLET | Freq: Once | ORAL | Status: AC
  Administered 2022-10-28: 1000 mg via ORAL
  Filled 2022-10-28: qty 2

## 2022-10-28 MED ORDER — ACETAMINOPHEN 500 MG PO TABS
ORAL_TABLET | ORAL | Status: AC
  Filled 2022-10-28: qty 1

## 2022-10-28 NOTE — ED Notes (Signed)
Pt given cup of water with verbal okay from [place]. Visitor at bedside.

## 2022-10-28 NOTE — Discharge Instructions (Signed)
As we discussed please take 1000 mg of Tylenol every 8 hours as needed for pain in your neck.  If you are having significant pain you may also take a tramadol in addition to the Tylenol as prescribed.  Do not drink alcohol or drive while taking this medication.  Please call the number provided for urology to see if you can obtain a follow-up appointment within the next 1 week.  Return to the emergency department for any weakness or numbness of any arm or leg worsening neck discomfort or any other symptom personally concerning to yourself.
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
## 2022-10-28 NOTE — ED Notes (Signed)
Pt accidentally dropped one of 547m tylenol tablets on floor; new tablet pulled from pyxis and pt took it. Pt given new leg bag and straps adjusted for pt.

## 2022-10-29 ENCOUNTER — Telehealth: Payer: Self-pay

## 2022-10-29 NOTE — Telephone Encounter (Signed)
Received message from Dr Erlene Quan that she received ER note that patient is not able to get in to be seen until March. Asked to have patient see someone sooner if it is not her. Spoke with patient and scheduled him for 11/02/22 instead with Dr Diamantina Providence as he needed sooner appointment.

## 2022-10-31 ENCOUNTER — Other Ambulatory Visit: Payer: Self-pay | Admitting: Internal Medicine

## 2022-10-31 DIAGNOSIS — E7800 Pure hypercholesterolemia, unspecified: Secondary | ICD-10-CM

## 2022-11-01 ENCOUNTER — Ambulatory Visit: Payer: Self-pay

## 2022-11-01 ENCOUNTER — Ambulatory Visit: Payer: Self-pay | Admitting: *Deleted

## 2022-11-01 NOTE — Telephone Encounter (Signed)
Message from [employer] sent at 11/01/2022  3:33 PM EST  Summary: catheter concern   The patient would like to speak with a member of clinical staff when possible regarding their catheter concerns  The patient shares that they noticed an increase in their temperature earlier which has now subsided  The patient is uncertain if their momentary rise was related to their catheter concerns but would like to speak with a member of staf when possible  Please contact further when available        Felt like fever- not dizzy vertigo couldn't find thermometer  Went away on its own. Pt anxious "because of everything going on."  Pt stated he feels better after talking a little bit. Pt admitted he was feeling anxious and needed to talk.  Reason for Disposition  Health Information question, no triage required and triager able to answer question  Answer Assessment - Initial Assessment Questions 1. REASON FOR CALL or QUESTION: "What is your reason for calling today?" or "How can I best help you?" or "What question do you have that I can help answer?"     Concerned he had a fever. Pt took temperature x 3 with glass thermometer and all were normal. Pt stated during the time he felt he had a fever he said he felt like the fever was going up. Was better and the feeling went away.  Protocols used: Information Only Call - No Triage-A-AH

## 2022-11-01 NOTE — Telephone Encounter (Signed)
Requested Prescriptions  Refused Prescriptions Disp Refills   atorvastatin (LIPITOR) 20 MG tablet [Pharmacy Med Name: Atorvastatin Calcium 20 MG Oral Tablet] 100 tablet 2    Sig: TAKE 1 TABLET BY MOUTH DAILY AT  6PM     Cardiovascular:  Antilipid - Statins Failed - 10/31/2022  5:53 AM      Failed - Lipid Panel in normal range within the last 12 months    Cholesterol, Total  Date Value Ref Range Status  04/20/2017 113 100 - 199 mg/dL Final   Cholesterol  Date Value Ref Range Status  09/23/2022 196 <200 mg/dL Final   LDL Cholesterol (Calc)  Date Value Ref Range Status  09/23/2022 128 (H) mg/dL (calc) Final    Comment:    Reference range: <100 . Desirable range <100 mg/dL for primary prevention;   <70 mg/dL for patients with CHD or diabetic patients  with > or = 2 CHD risk factors. Marland Kitchen LDL-C is now calculated using the Martin-Hopkins  calculation, which is a validated novel method providing  better accuracy than the Friedewald equation in the  estimation of LDL-C.  Cresenciano Genre et al. Annamaria Helling. [id number]): 2061-2068  (http://education.[employer].com/faq/FAQ164)    HDL  Date Value Ref Range Status  09/23/2022 53 > OR = 40 mg/dL Final  04/20/2017 26 (L) >39 mg/dL Final   Triglycerides  Date Value Ref Range Status  09/23/2022 56 <150 mg/dL Final         Passed - Patient is not pregnant      Passed - Valid encounter within last 12 months    Recent Outpatient Visits           6 days ago Urinary retention   [facility] [place], Coralie Keens, NP   1 month ago Encounter for general adult medical examination with abnormal findings   [facility] [place], Coralie Keens, NP   5 months ago Chronic right shoulder pain   [facility] [place], Coralie Keens, NP   6 months ago Shoulder impingement, right   [facility] Olin Hauser, DO   8 months ago Acquired hypothyroidism    [facility] [place], Coralie Keens, NP       Future Appointments             Tomorrow Diamantina Providence, Herbert Seta, MD [facility] [place], [place]   In 4 months [place], Coralie Keens, NP [facility], [facility]

## 2022-11-01 NOTE — Telephone Encounter (Signed)
  Chief Complaint: urine in urinary catheter pink to reddish tint in color Symptoms: urine color reddish tint, pink in color this am . Urine bag full this am upon awakening. Drank tomato juice yesterday and not sure if caused urine to change color.  Frequency: this am  Pertinent Negatives: Patient denies fever, no blood around catheter insertion site . No abdominal pain no clots in tubing  Disposition: []$ ED /[]$ Urgent Care (no appt availability in office) / []$ Appointment(In office/virtual)/ []$  [facility]/ []$ Home Care/ []$ Refused Recommended Disposition /[]$ [facility]/ [x]$  Follow-up with PCP Additional Notes:   Appt with urologist tomorrow. Please advise if appt needed today with PCP. Recommended patient to increase water intake and monitor urine color and call back if needed. Please advise     Reason for Disposition  [1] Pink, slightly red, or tea-colored urine lasts > 24 hours AND [2] not cleared by increased fluid intake AND [3] no recent prostate or bladder surgery  Answer Assessment - Initial Assessment Questions 1. SYMPTOMS: "What symptoms are you concerned about?"     Looks like reddish tint in urine tube from foley catheter 2. ONSET:  "When did the symptoms start?"     Today  3. FEVER: "Is there a fever?" If Yes, ask: "What is the temperature, how was it measured, and when did it start?"     No  4. ABDOMEN PAIN: "Is there any abdomen pain?" (e.g., Scale 1-10; or mild, moderate, severe)     Denies  5. URINE COLOR: "What color is the urine?"  "Is there blood present in the urine?" (e.g., clear, yellow, cloudy, tea-colored, blood streaks, bright red)     Reddish tint  6. URINE AMOUNT: "When did you last empty the urine from the collection bag?" "How much urine was in the bag at that time?" How much urine is in the collection bag now?"     This am large amount of urine this am  7. INSERTION: "How long have you (they) had the catheter?"     Last Tuesday  8. OTHER  SYMPTOMS: "Are there any other symptoms?" (e.g., abdomen swelling, back pain, bladder spasms, constipation, foul smelling urine, leaking of urine)      No . Drinking tomato juice last night  9. MEDICINES: "Are you taking any medicines to treat urinary problems?" (e.g., antibiotics for a urinary tract infection, medicines to treat bladder spasms)      No  10. PREGNANCY: "Is there any chance you are pregnant?" "When was your last menstrual period?"       na  Protocols used: Urinary Catheter (e.g., Foley) Symptoms and Questions-A-AH

## 2022-11-02 ENCOUNTER — Ambulatory Visit (INDEPENDENT_AMBULATORY_CARE_PROVIDER_SITE_OTHER): Payer: 59 | Admitting: Urology

## 2022-11-02 ENCOUNTER — Ambulatory Visit (INDEPENDENT_AMBULATORY_CARE_PROVIDER_SITE_OTHER): Payer: 59 | Admitting: Physician Assistant

## 2022-11-02 ENCOUNTER — Encounter: Payer: Self-pay | Admitting: Urology

## 2022-11-02 VITALS — BP 143/90 | HR 109 | Ht 69.5 in | Wt 173.0 lb

## 2022-11-02 DIAGNOSIS — R339 Retention of urine, unspecified: Secondary | ICD-10-CM

## 2022-11-02 DIAGNOSIS — N401 Enlarged prostate with lower urinary tract symptoms: Secondary | ICD-10-CM | POA: Diagnosis not present

## 2022-11-02 DIAGNOSIS — R310 Gross hematuria: Secondary | ICD-10-CM

## 2022-11-02 DIAGNOSIS — N138 Other obstructive and reflux uropathy: Secondary | ICD-10-CM

## 2022-11-02 LAB — BLADDER SCAN AMB NON-IMAGING: Scan Result: >623 mL

## 2022-11-02 MED ORDER — CEPHALEXIN 250 MG PO CAPS
500.0000 mg | ORAL_CAPSULE | Freq: Once | ORAL | Status: AC
  Administered 2022-11-02: 500 mg via ORAL

## 2022-11-02 NOTE — Patient Instructions (Signed)
Benign Prostatic Hyperplasia ? ?Benign prostatic hyperplasia (BPH) is an enlarged prostate gland that is caused by the normal aging process. The prostate may get bigger as a man gets older. The condition is not caused by cancer. The prostate is a walnut-sized gland that is involved in the production of semen. It is located in front of the rectum and below the bladder. The bladder stores urine. The urethra carries stored urine out of the body. ?An enlarged prostate can press on the urethra. This can make it harder to pass urine. The buildup of urine in the bladder can cause infection. Back pressure and infection may progress to bladder damage and kidney (renal) failure. ?What are the causes? ?This condition is part of the normal aging process. However, not all men develop problems from this condition. If the prostate enlarges away from the urethra, urine flow will not be blocked. If it enlarges toward the urethra and compresses it, there will be problems passing urine. ?What increases the risk? ?This condition is more likely to develop in men older than 50 years. ?What are the signs or symptoms? ?Symptoms of this condition include: ?Getting up often during the night to urinate. ?Needing to urinate frequently during the day. ?Difficulty starting urine flow. ?Decrease in size and strength of your urine stream. ?Leaking (dribbling) after urinating. ?Inability to pass urine. This needs immediate treatment. ?Inability to completely empty your bladder. ?Pain when you pass urine. This is more common if there is also an infection. ?Urinary tract infection (UTI). ?How is this diagnosed? ?This condition is diagnosed based on your medical history, a physical exam, and your symptoms. Tests will also be done, such as: ?A post-void bladder scan. This measures any amount of urine that may remain in your bladder after you finish urinating. ?A digital rectal exam. In a rectal exam, your health care provider checks your prostate by  putting a lubricated, gloved finger into your rectum to feel the back of your prostate gland. This exam detects the size of your gland and any abnormal lumps or growths. ?An exam of your urine (urinalysis). ?A prostate specific antigen (PSA) screening. This is a blood test used to screen for prostate cancer. ?An ultrasound. This test uses sound waves to electronically produce a picture of your prostate gland. ?Your health care provider may refer you to a specialist in kidney and prostate diseases (urologist). ?How is this treated? ?Once symptoms begin, your health care provider will monitor your condition (active surveillance or watchful waiting). Treatment for this condition will depend on the severity of your condition. Treatment may include: ?Observation and yearly exams. This may be the only treatment needed if your condition and symptoms are mild. ?Medicines to relieve your symptoms, including: ?Medicines to shrink the prostate. ?Medicines to relax the muscle of the prostate. ?Surgery in severe cases. Surgery may include: ?Prostatectomy. In this procedure, the prostate tissue is removed completely through an open incision or with a laparoscope or robotics. ?Transurethral resection of the prostate (TURP). In this procedure, a tool is inserted through the opening at the tip of the penis (urethra). It is used to cut away tissue of the inner core of the prostate. The pieces are removed through the same opening of the penis. This removes the blockage. ?Transurethral incision (TUIP). In this procedure, small cuts are made in the prostate. This lessens the prostate's pressure on the urethra. ?Transurethral microwave thermotherapy (TUMT). This procedure uses microwaves to create heat. The heat destroys and removes a small amount of   prostate tissue. ?Transurethral needle ablation (TUNA). This procedure uses radio frequencies to destroy and remove a small amount of prostate tissue. ?Interstitial laser coagulation (ILC).  This procedure uses a laser to destroy and remove a small amount of prostate tissue. ?Transurethral electrovaporization (TUVP). This procedure uses electrodes to destroy and remove a small amount of prostate tissue. ?Prostatic urethral lift. This procedure inserts an implant to push the lobes of the prostate away from the urethra. ?Follow these instructions at home: ?Take over-the-counter and prescription medicines only as told by your health care provider. ?Monitor your symptoms for any changes. Contact your health care provider with any changes. ?Avoid drinking large amounts of liquid before going to bed or out in public. ?Avoid or reduce how much caffeine or alcohol you drink. ?Give yourself time when you urinate. ?Keep all follow-up visits. This is important. ?Contact a health care provider if: ?You have unexplained back pain. ?Your symptoms do not get better with treatment. ?You develop side effects from the medicine you are taking. ?Your urine becomes very dark or has a bad smell. ?Your lower abdomen becomes distended and you have trouble passing urine. ?Get help right away if: ?You have a fever or chills. ?You suddenly cannot urinate. ?You feel light-headed or very dizzy, or you faint. ?There are large amounts of blood or clots in your urine. ?Your urinary problems become hard to manage. ?You develop moderate to severe low back or flank pain. The flank is the side of your body between the ribs and the hip. ?These symptoms may be an emergency. Get help right away. Call 911. ?Do not wait to see if the symptoms will go away. ?Do not drive yourself to the hospital. ?Summary ?Benign prostatic hyperplasia (BPH) is an enlarged prostate that is caused by the normal aging process. It is not caused by cancer. ?An enlarged prostate can press on the urethra. This can make it hard to pass urine. ?This condition is more likely to develop in men older than 50 years. ?Get help right away if you suddenly cannot urinate. ?This  information is not intended to replace advice given to you by your health care provider. Make sure you discuss any questions you have with your health care provider. ?Document Revised: 03/18/2021 Document Reviewed: 03/18/2021 ?Elsevier Patient Education ? 2023 Elsevier Inc[employer] ? ?

## 2022-11-02 NOTE — Progress Notes (Signed)
   11/02/22 8:47 AM   Joe Hardy 1941/08/14 [id number]  CC: BPH, urinary retention  HPI: 82 year old male referred for history of BPH with recent urinary retention requiring Foley catheter.  History is very difficult to obtain.  He lives alone.  He reports a few weeks of increased urinary frequency and difficulty voiding.  He initially attributed this to constipation, and symptoms did improve briefly after he did an enema at home.  Symptoms returned though and he ultimately ended up in the ER on 10/26/2022 with inability to urinate, and bladder scan of 772m.  A coud Foley catheter was placed with yellow urine.  He does report that he had a few episodes of gross hematuria without clots prior to having the catheter placed.  He denies any dysuria.  No prior cross-sectional imaging to review.  He has nocturia 1-2 times at baseline, denies any incontinence.   Recent PSA January 2024 was normal at 1.99, renal function normal with creatinine 1.04, EGFR 72.  Urinalysis from ER showed greater than 50 RBC, 20-50 WBC, no bacteria, small leukocytes, nitrite negative, was not sent for culture.  PMH: Past Medical History:  Diagnosis Date   Depression    GERD (gastroesophageal reflux disease)    Hyperlipidemia    Hypertension    Migraine    Sleep apnea     Surgical History: Past Surgical History:  Procedure Laterality Date   oral surgery     TONSILLECTOMY       Family History: Family History  Problem Relation Age of Onset   Tuberculosis Mother    Asthma Mother    Emphysema Father    Heart attack Father     Social History:  reports that he quit smoking about 5 years ago. His smoking use included cigarettes. He has a 30.00 pack-year smoking history. He has quit using smokeless tobacco. He reports that he does not drink alcohol and does not use drugs.  Physical Exam: BP (!) 143/90 (BP Location: Left Arm, Patient Position: Sitting, Cuff Size: Normal)   Pulse (!) 109   Ht 5' 9.5"  (1.765 m)   Wt 173 lb (78.5 kg)   SpO2 97%   BMI 25.18 kg/m    Constitutional:  Alert and oriented, No acute distress.  Frail-appearing Cardiovascular: No clubbing, cyanosis, or edema. Respiratory: Normal respiratory effort, no increased work of breathing. GI: Abdomen is soft, nontender, nondistended, no abdominal masses GU: Foley with pink urine  Laboratory Data: Reviewed, see HPI  Pertinent Imaging: No cross-sectional imaging to review  Assessment & Plan:   82year old frail gentleman who presents with urinary retention.  PSA and renal function normal.  Keflex was given for prophylaxis, Foley was removed today, and he returned this afternoon with inability to void and elevated PVR of 600 mL and Foley catheter was replaced.  With his gross hematuria prior to catheter placement, I recommended further evaluation per the AUA guideline recommendations with CT urogram and cystoscopy.  These will also help establish a prostate volume and evaluate for BPH/urethral stricture/other bladder pathology, and potentially help guide outlet procedures.  Follow-up for CT and cystoscopy for completion of gross hematuria workup, and further evaluation of prostate anatomy/consideration of outlet procedures  BNickolas Madrid MD 11/02/2022  [place] [place] [phone]

## 2022-11-02 NOTE — Progress Notes (Signed)
Catheter Removal  Patient is present today for a catheter removal.  84m of water was drained from the balloon. A 16FR Coude foley cath was removed from the bladder, no complications were noted. Patient tolerated well.  Performed by: OGordy Clement CTecumseh  Follow up/ Additional notes[place], pt given extensive return precautions. Patient given 1 time dose of Keflex 5075mPO per verbal order from Dr. SnDiamantina Providence

## 2022-11-02 NOTE — Progress Notes (Signed)
Afternoon follow-up  Patient returned to clinic this afternoon for repeat PVR. He has not been able to urinate. PVR >645m.  Results for orders placed or performed in visit on 11/02/22  BLADDER SCAN AMB NON-IMAGING  Result Value Ref Range   Scan Result >623 mL    Voiding trial failed. Offered patient Foley replacement and he accepted, see note below.  Simple Catheter Placement  Due to urinary retention patient is present today for a foley cath placement.  Patient was cleaned and prepped in a sterile fashion with betadine and 2% lidocaine jelly was instilled into the urethra. A 16 FR coude foley catheter was inserted, urine return was noted  600+ml, urine was amber in color.  The balloon was filled with 10cc of sterile water.  A leg bag was attached for drainage. Patient was given instruction on proper catheter care.  Patient tolerated well, no complications were noted   Performed by: SDebroah Loop PA-C and CElberta Leatherwood CMA  Follow up: Hematuria workup as scheduled per Dr. SDiamantina Providence

## 2022-11-09 ENCOUNTER — Ambulatory Visit
Admission: RE | Admit: 2022-11-09 | Discharge: 2022-11-09 | Disposition: A | Discharge status: Home / Self Care | Payer: 59 | Source: Ambulatory Visit | Attending: Urology | Admitting: Urology

## 2022-11-09 DIAGNOSIS — N281 Cyst of kidney, acquired: Secondary | ICD-10-CM | POA: Diagnosis not present

## 2022-11-09 DIAGNOSIS — R310 Gross hematuria: Secondary | ICD-10-CM | POA: Diagnosis not present

## 2022-11-09 DIAGNOSIS — K7689 Other specified diseases of liver: Secondary | ICD-10-CM | POA: Diagnosis not present

## 2022-11-09 MED ORDER — IOHEXOL 300 MG/ML  SOLN
125.0000 mL | Freq: Once | INTRAMUSCULAR | Status: AC | PRN
  Administered 2022-11-09: 125 mL via INTRAVENOUS

## 2022-11-12 ENCOUNTER — Telehealth: Payer: Self-pay

## 2022-11-12 NOTE — Telephone Encounter (Signed)
Spoke with patient and advised that CT will be discussed at his appt on Wednesday in [place].

## 2022-11-12 NOTE — Telephone Encounter (Signed)
Message left on triage line- 919am  Pt states he would like is CT scan results.  Pt has an appt 3/6 with [facility] to discuss results.

## 2022-11-12 NOTE — Telephone Encounter (Signed)
Pt calling in and wanting to receive the CT results.  Can he get the results prior to his 3/6 appt w [facility]?  Pt has anxiety regarding the CT.Joe Hardy

## 2022-11-17 ENCOUNTER — Ambulatory Visit: Payer: 59 | Admitting: Urology

## 2022-11-17 ENCOUNTER — Other Ambulatory Visit
Admission: RE | Admit: 2022-11-17 | Discharge: 2022-11-17 | Disposition: A | Discharge status: Home / Self Care | Payer: 59 | Attending: Urology | Admitting: Urology

## 2022-11-17 ENCOUNTER — Other Ambulatory Visit: Payer: Self-pay | Admitting: Urology

## 2022-11-17 ENCOUNTER — Ambulatory Visit (INDEPENDENT_AMBULATORY_CARE_PROVIDER_SITE_OTHER): Payer: 59 | Admitting: Urology

## 2022-11-17 VITALS — BP 165/91 | HR 89 | Ht 69.5 in | Wt 173.0 lb

## 2022-11-17 DIAGNOSIS — N210 Calculus in bladder: Secondary | ICD-10-CM

## 2022-11-17 DIAGNOSIS — N138 Other obstructive and reflux uropathy: Secondary | ICD-10-CM

## 2022-11-17 DIAGNOSIS — N401 Enlarged prostate with lower urinary tract symptoms: Secondary | ICD-10-CM | POA: Insufficient documentation

## 2022-11-17 DIAGNOSIS — R339 Retention of urine, unspecified: Secondary | ICD-10-CM

## 2022-11-17 DIAGNOSIS — R591 Generalized enlarged lymph nodes: Secondary | ICD-10-CM

## 2022-11-17 DIAGNOSIS — R310 Gross hematuria: Secondary | ICD-10-CM | POA: Diagnosis not present

## 2022-11-17 MED ORDER — LIDOCAINE HCL URETHRAL/MUCOSAL 2 % EX GEL
1.0000 | Freq: Once | CUTANEOUS | Status: AC
  Administered 2022-11-17: 1 via URETHRAL

## 2022-11-17 NOTE — Progress Notes (Signed)
Surgical Physician Order Form [facility]   * Scheduling expectation : Next Available  *Length of Case: 2 hours  *Clearance needed: no  *Anticoagulation Instructions: Hold all anticoagulants  *Aspirin Instructions: Hold Aspirin  *Post-op visit Date/Instructions:  1-3 day cath removal  *Diagnosis: BPH w/BOO, bladder stones  *Procedure:  HOLEP [id number]) and cystolitholapaxy   Additional orders: N/A  -Admit type: OUTpatient  -Anesthesia: General  -VTE Prophylaxis Standing Order SCD's       Other:   -Standing Lab Orders Per Anesthesia    Lab other: UA&Urine Culture sent 3/6  -Standing Test orders EKG/Chest x-ray per Anesthesia       Test other:   - Medications:  Ancef 2gm IV  -Other orders:  N/A

## 2022-11-17 NOTE — Progress Notes (Signed)
Simple Catheter Placement  Due to urinary retention patient is present today for a foley cath placement.  Patient was cleaned and prepped in a sterile fashion with betadine and 2% lidocaine jelly was instilled into the urethra. A  16FR Coude foley catheter was inserted, urine return was noted 166m, urine was pale yellow in color. The balloon was filled with 10cc of sterile water. Urine specimen was sent for urine culture. A leg bag was attached for drainage. Patient was also given a night bag to take home and was given instruction on how to change from one bag to another.  Patient was given instruction on proper catheter care.  Patient tolerated well, no complications were noted.  Performed by: OGordy Clement CMA   Additional notes/ Follow up: Patient to be scheduled for surgery. Will call with urine results.

## 2022-11-17 NOTE — Progress Notes (Signed)
Cystoscopy Procedure Note:  Indication: Gross hematuria, urinary retention  After informed consent and discussion of the procedure and its risks, Joe Hardy was positioned and prepped in the standard fashion. Cystoscopy was performed with a flexible cystoscope. The urethra, bladder neck and entire bladder was visualized in a standard fashion. The prostate was large with an elevated bladder neck.  Moderate bladder trabeculations, posterior bladder erythema consistent with chronic Foley.  No definite tumors identified.  Multiple bladder stones on retroflexion. Imaging: CT urogram showing enlarged prostate measuring 96 g, multiple bladder stones, no other urologic abnormalities.  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  CT shows multiple bladder stones, enlarged prostate measuring 96 g.  We discussed options including cystolitholapaxy with HOLEP or chronic Foley.  He opted for intervention to remove bladder stones and resume spontaneous voiding.  We discussed the risks and benefits of HoLEP at length.  The procedure requires general anesthesia and takes 1 to 2 hours, and a holmium laser is used to enucleate the prostate and push this tissue into the bladder.  A morcellator is then used to remove this tissue, which is sent for pathology.  The vast majority(>95%) of patients are able to discharge the same day with a catheter in place for 2 to 3 days, and will follow-up in clinic for a voiding trial.  We specifically discussed the risks of bleeding, infection, retrograde ejaculation, temporary urgency and urge incontinence, very low risk of long-term incontinence, urethral  stricture/bladder neck contracture, pathologic evaluation of prostate tissue and possible detection of prostate cancer or other malignancy, and possible need for additional procedures.  Foley catheter was replaced in standard sterile fashion, see procedure note.  Schedule cystolitholapaxy and HOLEP Urine sent for culture today Referral placed to oncology regarding pathologic left pelvic lymph node(PSA normal at 1.99)  Nickolas Madrid, MD 11/17/2022

## 2022-11-17 NOTE — Progress Notes (Signed)
   [facility]  Surgery Date: Date: 11/26/2022  Surgeon: Dr. 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

## 2022-11-17 NOTE — Patient Instructions (Signed)

## 2022-11-18 ENCOUNTER — Telehealth: Payer: Self-pay

## 2022-11-18 NOTE — Telephone Encounter (Signed)
Called received on triage line 817am  Pt states he has a discharge. Looks like semen.   SM replaced his cath yesterday. Is this normal?   Appt yesterday with BS.   [place]

## 2022-11-18 NOTE — Telephone Encounter (Signed)
Called pt he states that he has seen some blood in his catheter this morning and wonders if this normal. Informed pt that some blood in the urine is normal post cysto and with bladder stones as well as foley placement. Patient states blood has been minimal in fact it has cleared up. Pt given return precautions. Pt voiced understanding.

## 2022-11-18 NOTE — Telephone Encounter (Signed)
I spoke with Joe Hardy. We have discussed possible surgery dates and Friday March 15th, 2024 was agreed upon by all parties. Patient given information about surgery date, what to expect pre-operatively and post operatively.  We discussed that a Pre-Admission Testing office will be calling to set up the pre-op visit that will take place prior to surgery, and that these appointments are typically done over the phone with a Pre-Admissions RN. Informed patient that our office will communicate any additional care to be provided after surgery. Patients questions or concerns were discussed during our call. Advised to call our office should there be any additional information, questions or concerns that arise. Patient verbalized understanding.

## 2022-11-19 ENCOUNTER — Other Ambulatory Visit: Payer: Self-pay | Admitting: Internal Medicine

## 2022-11-19 ENCOUNTER — Telehealth: Payer: Self-pay

## 2022-11-19 DIAGNOSIS — N401 Enlarged prostate with lower urinary tract symptoms: Secondary | ICD-10-CM

## 2022-11-19 DIAGNOSIS — A488 Other specified bacterial diseases: Secondary | ICD-10-CM

## 2022-11-19 DIAGNOSIS — N138 Other obstructive and reflux uropathy: Secondary | ICD-10-CM

## 2022-11-19 LAB — URINE CULTURE: Culture: >=100000 — AB

## 2022-11-19 MED ORDER — SULFAMETHOXAZOLE-TRIMETHOPRIM 800-160 MG PO TABS: 1.0000 | ORAL_TABLET | Freq: Two times a day (BID) | ORAL | 0 refills | Status: AC

## 2022-11-19 NOTE — Telephone Encounter (Signed)
-----   Message from Billey Co, MD sent at 11/19/2022  1:58 PM EST ----- Please start bactrim ds bid x 10 days to sterilize urine prior to upcoming HOLEP  Thanks Nickolas Madrid, MD 11/19/2022

## 2022-11-19 NOTE — Telephone Encounter (Signed)
Called pt informed him of the information below and the need for antibiotics. RX sent into the pharmacy. Pt voiced understanding.

## 2022-11-22 ENCOUNTER — Inpatient Hospital Stay: Payer: 59 | Admitting: Internal Medicine

## 2022-11-22 ENCOUNTER — Other Ambulatory Visit: Payer: Self-pay

## 2022-11-22 ENCOUNTER — Encounter
Admission: RE | Admit: 2022-11-22 | Discharge: 2022-11-22 | Disposition: A | Discharge status: Home / Self Care | Payer: 59 | Source: Ambulatory Visit | Attending: Urology | Admitting: Urology

## 2022-11-22 ENCOUNTER — Inpatient Hospital Stay: Payer: 59

## 2022-11-22 VITALS — Ht 69.5 in | Wt 160.0 lb

## 2022-11-22 DIAGNOSIS — E782 Mixed hyperlipidemia: Secondary | ICD-10-CM

## 2022-11-22 DIAGNOSIS — I10 Essential (primary) hypertension: Secondary | ICD-10-CM

## 2022-11-22 HISTORY — DX: Iron deficiency anemia, unspecified: D50.9

## 2022-11-22 HISTORY — DX: Calculus in bladder: N21.0

## 2022-11-22 NOTE — Patient Instructions (Addendum)
Your procedure is scheduled on: Friday 11/26/22 To find out your arrival time, please call [phone] between [place] on:   Thursday 11/25/22 Report to the Registration Desk on the 1st floor of the [facility]. Valet parking is available.  If your arrival time is 6:00 am, do not arrive before that time as the [facility] entrance doors do not open until 6:00 am.  REMEMBER: Instructions that are not followed completely may result in serious medical risk, up to and including death; or upon the discretion of your surgeon and anesthesiologist your surgery may need to be rescheduled.  Do not eat food after midnight the night before surgery.  No gum chewing or hard candies.  You may however, drink CLEAR liquids up to 2 hours before you are scheduled to arrive for your surgery. Do not drink anything within 2 hours of your scheduled arrival time.  Clear liquids include: - water  - apple juice without pulp - gatorade (not RED colors) - black coffee or tea (Do NOT add milk or creamers to the coffee or tea) Do NOT drink anything that is not on this list.  Type 1 and Type 2 diabetics should only drink water.  In addition, your doctor has ordered for you to drink the provided:  Ensure Pre-Surgery Clear Carbohydrate Drink  Gatorade G2 Drinking this carbohydrate drink up to two hours before surgery helps to reduce insulin resistance and improve patient outcomes. Please complete drinking 2 hours before scheduled arrival time.  One week prior to surgery: Stop Anti-inflammatories (NSAIDS) such as Advil, Aleve, Ibuprofen, Motrin, Naproxen, Naprosyn and Aspirin based products such as Excedrin, Goody's Powder, BC Powder. You may however, continue to take Tylenol if needed for pain up until the day of surgery.  Stop ANY OVER THE COUNTER supplements until after surgery.  Continue taking all prescribed medications with the exception of the following:  **Follow guidelines for insulin and diabetes  medications**  Follow recommendations from Cardiologist or PCP regarding stopping blood thinners.  TAKE ONLY THESE MEDICATIONS THE MORNING OF SURGERY WITH A SIP OF WATER:    Antacid (take one the night before and one on the morning of surgery - helps to prevent nausea after surgery.)  Use inhalers on the day of surgery and bring to the hospital.  Fleets enema or bowel prep as directed.  No Alcohol for 24 hours before or after surgery.  No Smoking including e-cigarettes for 24 hours before surgery.  No chewable tobacco products for at least 6 hours before surgery.  No nicotine patches on the day of surgery.  Do not use any "recreational" drugs for at least a week (preferably 2 weeks) before your surgery.  Please be advised that the combination of cocaine and anesthesia may have negative outcomes, up to and including death. If you test positive for cocaine, your surgery will be cancelled.  On the morning of surgery brush your teeth with toothpaste and water, you may rinse your mouth with mouthwash if you wish. Do not swallow any toothpaste or mouthwash.  Use CHG Soap or wipes as directed on instruction sheet.  Do not wear lotions, powders, or perfumes.   Do not shave body hair from the neck down 48 hours before surgery.  Wear comfortable clothing (specific to your surgery type) to the hospital.  Do not wear jewelry, make-up, hairpins, clips or nail polish.  Contact lenses, hearing aids and dentures may not be worn into surgery.  Bring your C-PAP to the hospital  in case you may have to spend the night.   Do not bring valuables to the hospital. [facility] is not responsible for any missing/lost belongings or valuables.   Total Shoulder Arthroplasty:  use Benzoyl Peroxide 5% Gel as directed on instruction sheet.  Notify your doctor if there is any change in your medical condition (cold, fever, infection).  If you are being discharged the day of surgery, you will not be  allowed to drive home. You will need a responsible individual to drive you home and stay with you for 24 hours after surgery.   If you are taking public transportation, you will need to have a responsible individual with you.  If you are being admitted to the hospital overnight, leave your suitcase in the car. After surgery it may be brought to your room.  In case of increased patient census, it may be necessary for you, the patient, to continue your postoperative care in the Same Day Surgery department.  After surgery, you can help prevent lung complications by doing breathing exercises.  Take deep breaths and cough every 1-2 hours. Your doctor may order a device called an Incentive Spirometer to help you take deep breaths. When coughing or sneezing, hold a pillow firmly against your incision with both hands. This is called "splinting." Doing this helps protect your incision. It also decreases belly discomfort.  Surgery Visitation Policy:  Patients undergoing a surgery or procedure may have two family members or support persons with them as long as the person is not COVID-19 positive or experiencing its symptoms.   Inpatient Visitation:    Visiting hours are 7 a.m. to 8 p.m. Up to four visitors are allowed at one time in a patient room. The visitors may rotate out with other people during the day. One designated support person (adult) may remain overnight.  Due to an increase in RSV and influenza rates and associated hospitalizations, children ages 45 and under will not be able to visit patients in [facility]. Masks continue to be strongly recommended.  Please call the [facility] Dept. at [phone] if you have any questions about these instructions.

## 2022-11-22 NOTE — Telephone Encounter (Signed)
Requested Prescriptions  Pending Prescriptions Disp Refills   terazosin (HYTRIN) 5 MG capsule [Pharmacy Med Name: Terazosin HCl 5 MG Oral Capsule] 90 capsule 0    Sig: TAKE 1 CAPSULE BY MOUTH DAILY     Cardiovascular:  Alpha Blockers Failed - 11/19/2022 10:03 PM      Failed - Last BP in normal range    BP Readings from Last 1 Encounters:  11/17/22 (!) 165/91         Passed - Valid encounter within last 6 months    Recent Outpatient Visits           3 weeks ago Urinary retention   [facility] [place], Coralie Keens, NP   2 months ago Encounter for general adult medical examination with abnormal findings   [facility] [place], Coralie Keens, NP   5 months ago Chronic right shoulder pain   [facility] [place], Coralie Keens, NP   6 months ago Shoulder impingement, right   [facility] Olin Hauser, DO   8 months ago Acquired hypothyroidism   [facility] [place], Coralie Keens, NP       Future Appointments             In 3 months [facility], Herbert Seta, MD [place]   In 4 months [place], Coralie Keens, NP [facility], [facility]

## 2022-11-25 ENCOUNTER — Inpatient Hospital Stay: Payer: 59

## 2022-11-25 ENCOUNTER — Inpatient Hospital Stay: Payer: 59 | Admitting: Internal Medicine

## 2022-11-25 MED ORDER — CHLORHEXIDINE GLUCONATE 0.12 % MT SOLN: 15.0000 mL | Freq: Once | OROMUCOSAL | Status: AC

## 2022-11-25 MED ORDER — FAMOTIDINE 20 MG PO TABS: 20.0000 mg | ORAL_TABLET | Freq: Once | ORAL | Status: AC

## 2022-11-25 MED ORDER — LACTATED RINGERS IV SOLN: INTRAVENOUS | Status: DC

## 2022-11-25 MED ORDER — ORAL CARE MOUTH RINSE: 15.0000 mL | Freq: Once | OROMUCOSAL | Status: AC

## 2022-11-25 MED ORDER — CEFAZOLIN SODIUM-DEXTROSE 2-4 GM/100ML-% IV SOLN: 2.0000 g | INTRAVENOUS | Status: DC

## 2022-11-26 ENCOUNTER — Ambulatory Visit: Payer: 59 | Admitting: General Practice

## 2022-11-26 ENCOUNTER — Ambulatory Visit
Admission: RE | Admit: 2022-11-26 | Discharge: 2022-11-26 | Disposition: A | Discharge status: Home / Self Care | Payer: 59 | Attending: Urology | Admitting: Urology

## 2022-11-26 ENCOUNTER — Encounter
Admission: RE | Disposition: A | Discharge status: Home / Self Care | Payer: Self-pay | Source: Home / Self Care | Attending: Urology

## 2022-11-26 ENCOUNTER — Encounter: Payer: Self-pay | Admitting: Urology

## 2022-11-26 ENCOUNTER — Ambulatory Visit: Payer: 59 | Admitting: Urgent Care

## 2022-11-26 DIAGNOSIS — G4730 Sleep apnea, unspecified: Secondary | ICD-10-CM | POA: Diagnosis not present

## 2022-11-26 DIAGNOSIS — Z87891 Personal history of nicotine dependence: Secondary | ICD-10-CM | POA: Insufficient documentation

## 2022-11-26 DIAGNOSIS — N401 Enlarged prostate with lower urinary tract symptoms: Secondary | ICD-10-CM | POA: Insufficient documentation

## 2022-11-26 DIAGNOSIS — R338 Other retention of urine: Secondary | ICD-10-CM | POA: Insufficient documentation

## 2022-11-26 DIAGNOSIS — I10 Essential (primary) hypertension: Secondary | ICD-10-CM | POA: Insufficient documentation

## 2022-11-26 DIAGNOSIS — C61 Malignant neoplasm of prostate: Secondary | ICD-10-CM | POA: Insufficient documentation

## 2022-11-26 DIAGNOSIS — N210 Calculus in bladder: Secondary | ICD-10-CM | POA: Insufficient documentation

## 2022-11-26 DIAGNOSIS — N138 Other obstructive and reflux uropathy: Secondary | ICD-10-CM | POA: Insufficient documentation

## 2022-11-26 DIAGNOSIS — E782 Mixed hyperlipidemia: Secondary | ICD-10-CM

## 2022-11-26 DIAGNOSIS — C679 Malignant neoplasm of bladder, unspecified: Secondary | ICD-10-CM | POA: Diagnosis not present

## 2022-11-26 HISTORY — PX: CYSTOSCOPY WITH LITHOLAPAXY: SHX1425

## 2022-11-26 HISTORY — PX: HOLEP-LASER ENUCLEATION OF THE PROSTATE WITH MORCELLATION: SHX6641

## 2022-11-26 SURGERY — ENUCLEATION, PROSTATE, USING LASER, WITH MORCELLATION
Anesthesia: General

## 2022-11-26 MED ORDER — LIDOCAINE HCL (PF) 2 % IJ SOLN
INTRAMUSCULAR | Status: AC
  Filled 2022-11-26: qty 5

## 2022-11-26 MED ORDER — OXYCODONE HCL 5 MG PO TABS: 5.0000 mg | ORAL_TABLET | Freq: Once | ORAL | Status: DC | PRN

## 2022-11-26 MED ORDER — PROPOFOL 10 MG/ML IV BOLUS
INTRAVENOUS | Status: DC | PRN
  Administered 2022-11-26: 120 mg via INTRAVENOUS
  Administered 2022-11-26: 30 mg via INTRAVENOUS

## 2022-11-26 MED ORDER — FENTANYL CITRATE (PF) 100 MCG/2ML IJ SOLN: 25.0000 ug | INTRAMUSCULAR | Status: DC | PRN

## 2022-11-26 MED ORDER — DEXAMETHASONE SODIUM PHOSPHATE 10 MG/ML IJ SOLN
INTRAMUSCULAR | Status: DC | PRN
  Administered 2022-11-26: 10 mg via INTRAVENOUS

## 2022-11-26 MED ORDER — CIPROFLOXACIN IN D5W 400 MG/200ML IV SOLN
INTRAVENOUS | Status: AC
  Filled 2022-11-26: qty 200

## 2022-11-26 MED ORDER — LIDOCAINE HCL (CARDIAC) PF 100 MG/5ML IV SOSY
PREFILLED_SYRINGE | INTRAVENOUS | Status: DC | PRN
  Administered 2022-11-26: 50 mg via INTRAVENOUS

## 2022-11-26 MED ORDER — ACETAMINOPHEN 10 MG/ML IV SOLN
INTRAVENOUS | Status: DC | PRN
  Administered 2022-11-26: 1000 mg via INTRAVENOUS

## 2022-11-26 MED ORDER — SODIUM CHLORIDE 0.9 % IR SOLN
Status: DC | PRN
  Administered 2022-11-26: 30000 mL

## 2022-11-26 MED ORDER — EPHEDRINE 5 MG/ML INJ
INTRAVENOUS | Status: AC
  Filled 2022-11-26: qty 5

## 2022-11-26 MED ORDER — ACETAMINOPHEN 10 MG/ML IV SOLN
INTRAVENOUS | Status: AC
  Filled 2022-11-26: qty 100

## 2022-11-26 MED ORDER — ROCURONIUM BROMIDE 100 MG/10ML IV SOLN
INTRAVENOUS | Status: DC | PRN
  Administered 2022-11-26 (×2): 10 mg via INTRAVENOUS
  Administered 2022-11-26: 50 mg via INTRAVENOUS

## 2022-11-26 MED ORDER — CEFAZOLIN SODIUM-DEXTROSE 2-4 GM/100ML-% IV SOLN
INTRAVENOUS | Status: AC
  Filled 2022-11-26: qty 100

## 2022-11-26 MED ORDER — PROPOFOL 10 MG/ML IV BOLUS
INTRAVENOUS | Status: AC
  Filled 2022-11-26: qty 20

## 2022-11-26 MED ORDER — CHLORHEXIDINE GLUCONATE 0.12 % MT SOLN
OROMUCOSAL | Status: AC
  Administered 2022-11-26: 15 mL via OROMUCOSAL
  Filled 2022-11-26: qty 15

## 2022-11-26 MED ORDER — MAGNESIUM 30 MG PO TABS: 30.0000 mg | ORAL_TABLET | Freq: Every day | ORAL | 3 refills | Status: DC

## 2022-11-26 MED ORDER — DEXAMETHASONE SODIUM PHOSPHATE 10 MG/ML IJ SOLN
INTRAMUSCULAR | Status: AC
  Filled 2022-11-26: qty 1

## 2022-11-26 MED ORDER — FAMOTIDINE 20 MG PO TABS
ORAL_TABLET | ORAL | Status: AC
  Administered 2022-11-26: 20 mg via ORAL
  Filled 2022-11-26: qty 1

## 2022-11-26 MED ORDER — OXYCODONE HCL 5 MG/5ML PO SOLN: 5.0000 mg | Freq: Once | ORAL | Status: DC | PRN

## 2022-11-26 MED ORDER — FENTANYL CITRATE (PF) 100 MCG/2ML IJ SOLN
INTRAMUSCULAR | Status: DC | PRN
  Administered 2022-11-26: 25 ug via INTRAVENOUS
  Administered 2022-11-26: 50 ug via INTRAVENOUS
  Administered 2022-11-26: 25 ug via INTRAVENOUS

## 2022-11-26 MED ORDER — SUGAMMADEX SODIUM 200 MG/2ML IV SOLN
INTRAVENOUS | Status: DC | PRN
  Administered 2022-11-26: 150 mg via INTRAVENOUS

## 2022-11-26 MED ORDER — GLYCOPYRROLATE 0.2 MG/ML IJ SOLN
INTRAMUSCULAR | Status: AC
  Filled 2022-11-26: qty 1

## 2022-11-26 MED ORDER — ONDANSETRON HCL 4 MG/2ML IJ SOLN
INTRAMUSCULAR | Status: AC
  Filled 2022-11-26: qty 2

## 2022-11-26 MED ORDER — MIDAZOLAM HCL 2 MG/2ML IJ SOLN
INTRAMUSCULAR | Status: AC
  Filled 2022-11-26: qty 2

## 2022-11-26 MED ORDER — HYDROCODONE-ACETAMINOPHEN 5-325 MG PO TABS: 1.0000 | ORAL_TABLET | Freq: Four times a day (QID) | ORAL | 0 refills | Status: AC | PRN

## 2022-11-26 MED ORDER — CIPROFLOXACIN IN D5W 400 MG/200ML IV SOLN
400.0000 mg | Freq: Once | INTRAVENOUS | Status: AC
  Administered 2022-11-26: 400 mg via INTRAVENOUS

## 2022-11-26 MED ORDER — ROCURONIUM BROMIDE 10 MG/ML (PF) SYRINGE
PREFILLED_SYRINGE | INTRAVENOUS | Status: AC
  Filled 2022-11-26: qty 10

## 2022-11-26 MED ORDER — FENTANYL CITRATE (PF) 100 MCG/2ML IJ SOLN
INTRAMUSCULAR | Status: AC
  Filled 2022-11-26: qty 2

## 2022-11-26 MED ORDER — ONDANSETRON HCL 4 MG/2ML IJ SOLN
INTRAMUSCULAR | Status: DC | PRN
  Administered 2022-11-26: 4 mg via INTRAVENOUS

## 2022-11-26 SURGICAL SUPPLY — 46 items
ADAPTER IRRIG TUBE 2 SPIKE SOL (ADAPTER) ×2 IMPLANT
ADPR TBG 2 SPK PMP STRL ASCP (ADAPTER) ×2
BAG DRAIN SIEMENS DORNER NS (MISCELLANEOUS) ×1 IMPLANT
BAG DRN LRG CPC RND TRDRP CNTR (MISCELLANEOUS) ×1
BAG DRN NS LF (MISCELLANEOUS) ×1
BAG DRN RND TRDRP ANRFLXCHMBR (UROLOGICAL SUPPLIES) ×1
BAG URINE DRAIN 2000ML AR STRL (UROLOGICAL SUPPLIES) ×1 IMPLANT
BAG URO DRAIN 4000ML (MISCELLANEOUS) ×1 IMPLANT
BLADE MORCELLATOR CYBERBLADE (PROSTATE) IMPLANT
CATH FOLEY 3WAY 30CC 24FR (CATHETERS) ×1
CATH URETL OPEN END 4X70 (CATHETERS) ×1 IMPLANT
CATH URTH STD 24FR FL 3W 2 (CATHETERS) ×1 IMPLANT
CNTNR URN SCR LID CUP LEK RST (MISCELLANEOUS) IMPLANT
CONT SPEC 4OZ STRL OR WHT (MISCELLANEOUS)
CONTAINER COLLECT MORCELLATR (MISCELLANEOUS) ×1 IMPLANT
DRAPE UTILITY 15X26 TOWEL STRL (DRAPES) IMPLANT
ELECT BIVAP BIPO 22/24 DONUT (ELECTROSURGICAL)
ELECTRD BIVAP BIPO 22/24 DONUT (ELECTROSURGICAL) IMPLANT
FIBER LASER MOSES 550 DFL (Laser) ×1 IMPLANT
FILTER OVERFLOW MORCELLATOR (FILTER) ×1 IMPLANT
GLOVE SURG UNDER POLY LF SZ7.5 (GLOVE) ×1 IMPLANT
GOWN STRL REUS W/ TWL LRG LVL3 (GOWN DISPOSABLE) ×1 IMPLANT
GOWN STRL REUS W/ TWL XL LVL3 (GOWN DISPOSABLE) ×1 IMPLANT
GOWN STRL REUS W/TWL LRG LVL3 (GOWN DISPOSABLE) ×1
GOWN STRL REUS W/TWL XL LVL3 (GOWN DISPOSABLE) ×1
HOLDER FOLEY CATH W/STRAP (MISCELLANEOUS) ×1 IMPLANT
IV NS IRRIG 3000ML ARTHROMATIC (IV SOLUTION) ×5 IMPLANT
KIT PROBE TRILOGY 3.9X350 (MISCELLANEOUS) IMPLANT
KIT TURNOVER CYSTO (KITS) ×1 IMPLANT
MANIFOLD NEPTUNE II (INSTRUMENTS) ×1 IMPLANT
MBRN O SEALING YLW 17 FOR INST (MISCELLANEOUS) ×1
MEMBRANE SLNG YLW 17 FOR INST (MISCELLANEOUS) ×1 IMPLANT
MORCELLATOR COLLECT CONTAINER (MISCELLANEOUS) ×1
MORCELLATOR OVERFLOW FILTER (FILTER) ×1
MORCELLATOR ROTATION 4.75 335 (MISCELLANEOUS) ×1 IMPLANT
PACK CYSTO AR (MISCELLANEOUS) ×1 IMPLANT
SET CYSTO W/LG BORE CLAMP LF (SET/KITS/TRAYS/PACK) ×1 IMPLANT
SET IRRIG Y TYPE TUR BLADDER L (SET/KITS/TRAYS/PACK) ×1 IMPLANT
SLEEVE PROTECTION STRL DISP (MISCELLANEOUS) ×2 IMPLANT
SURGILUBE 2OZ TUBE FLIPTOP (MISCELLANEOUS) ×1 IMPLANT
SYR TOOMEY IRRIG 70ML (MISCELLANEOUS) ×1
SYRINGE TOOMEY IRRIG 70ML (MISCELLANEOUS) ×1 IMPLANT
TRAP FLUID SMOKE EVACUATOR (MISCELLANEOUS) ×1 IMPLANT
TUBE PUMP MORCELLATOR PIRANHA (TUBING) ×1 IMPLANT
WATER STERILE IRR 1000ML POUR (IV SOLUTION) ×1 IMPLANT
WATER STERILE IRR 500ML POUR (IV SOLUTION) ×1 IMPLANT

## 2022-11-26 NOTE — Discharge Instructions (Addendum)
AMBULATORY SURGERY  ?DISCHARGE INSTRUCTIONS ? ? ?The drugs that you were given will stay in your system until tomorrow so for the next 24 hours you should not: ? ?Drive an automobile ?Make any legal decisions ?Drink any alcoholic beverage ? ? ?You may resume regular meals tomorrow.  Today it is better to start with liquids and gradually work up to solid foods. ? ?You may eat anything you prefer, but it is better to start with liquids, then soup and crackers, and gradually work up to solid foods. ? ? ?Please notify your doctor immediately if you have any unusual bleeding, trouble breathing, redness and pain at the surgery site, drainage, fever, or pain not relieved by medication. ? ? ? ?Additional Instructions: ? ? ? ?Please contact your physician with any problems or Same Day Surgery at [phone], Monday through Friday 6 am to 4 pm, or [place] at [place] Main number at [phone].  ?

## 2022-11-26 NOTE — Anesthesia Procedure Notes (Signed)
Procedure Name: Intubation Date/Time: 11/26/2022 9:04 AM  Performed by: Levin Erp, CRNAPre-anesthesia Checklist: Patient identified, Emergency Drugs available, Suction available, Patient being monitored and Timeout performed Patient Re-evaluated:Patient Re-evaluated prior to induction Oxygen Delivery Method: Circle system utilized Preoxygenation: Pre-oxygenation with 100% oxygen Induction Type: IV induction Ventilation: Mask ventilation without difficulty and Oral airway inserted - appropriate to patient size Laryngoscope Size: McGraph and 4 Grade View: Grade I Tube type: Oral Tube size: 7.5 mm Number of attempts: 1 Airway Equipment and Method: Stylet Placement Confirmation: ETT inserted through vocal cords under direct vision, positive ETCO2 and breath sounds checked- equal and bilateral Secured at: 21 (lip) cm Tube secured with: Tape Dental Injury: Teeth and Oropharynx as per pre-operative assessment

## 2022-11-26 NOTE — Anesthesia Preprocedure Evaluation (Signed)
Anesthesia Evaluation  Patient identified by MRN, date of birth, ID band Patient awake    Reviewed: Allergy & Precautions, NPO status , Patient's Chart, lab work & pertinent test results  Airway Mallampati: III  TM Distance: >3 FB Neck ROM: full    Dental  (+) Dental Advidsory Given, Edentulous Lower, Edentulous Upper   Pulmonary neg pulmonary ROS, neg shortness of breath, neg COPD, former smoker   Pulmonary exam normal        Cardiovascular hypertension, (-) angina (-) Past MI and (-) CABG negative cardio ROS Normal cardiovascular exam     Neuro/Psych  PSYCHIATRIC DISORDERS      negative neurological ROS     GI/Hepatic Neg liver ROS,GERD  ,,  Endo/Other  Hypothyroidism    Renal/GU      Musculoskeletal   Abdominal   Peds  Hematology negative hematology ROS (+)   Anesthesia Other Findings Past Medical History: No date: Bladder stones No date: Depression No date: GERD (gastroesophageal reflux disease) No date: Hyperlipidemia No date: Hypertension No date: Iron deficiency anemia No date: Migraine No date: Sleep apnea  Past Surgical History: No date: COLONOSCOPY No date: oral surgery No date: TONSILLECTOMY  BMI    Body Mass Index: 23.26 kg/m      Reproductive/Obstetrics negative OB ROS                             Anesthesia Physical Anesthesia Plan  ASA: 2  Anesthesia Plan: General ETT   Post-op Pain Management:    Induction: Intravenous  PONV Risk Score and Plan: Ondansetron, Dexamethasone, Midazolam and Treatment may vary due to age or medical condition  Airway Management Planned: Oral ETT  Additional Equipment:   Intra-op Plan:   Post-operative Plan: Extubation in OR  Informed Consent: I have reviewed the patients History and Physical, chart, labs and discussed the procedure including the risks, benefits and alternatives for the proposed anesthesia with the  patient or authorized representative who has indicated his/her understanding and acceptance.     Dental Advisory Given  Plan Discussed with: Anesthesiologist, CRNA and Surgeon  Anesthesia Plan Comments: (Patient consented for risks of anesthesia including but not limited to:  - adverse reactions to medications - damage to eyes, teeth, lips or other oral mucosa - nerve damage due to positioning  - sore throat or hoarseness - Damage to heart, brain, nerves, lungs, other parts of body or loss of life  Patient voiced understanding.)       Anesthesia Quick Evaluation

## 2022-11-26 NOTE — H&P (Signed)
   11/26/22 8:41 AM   Denny Levy 20-Aug-1941 [id number]  CC: BPH, urinary retention, bladder stone  HPI: 82 year old male with worsening urinary symptoms culminating in urinary retention, failed voiding trials.  CT shows multiple bladder stones, enlarged prostate measuring 96 g.   We discussed options including cystolitholapaxy with HOLEP or chronic Foley.  He opted for intervention to remove bladder stones and resume spontaneous voiding.     PMH: Past Medical History:  Diagnosis Date   Bladder stones    Depression    GERD (gastroesophageal reflux disease)    Hyperlipidemia    Hypertension    Iron deficiency anemia    Migraine    Sleep apnea     Surgical History: Past Surgical History:  Procedure Laterality Date   COLONOSCOPY     oral surgery     TONSILLECTOMY      Family History: Family History  Problem Relation Age of Onset   Tuberculosis Mother    Asthma Mother    Emphysema Father    Heart attack Father     Social History:  reports that he quit smoking about 5 years ago. His smoking use included cigarettes. He has a 30.00 pack-year smoking history. He has quit using smokeless tobacco. He reports that he does not currently use drugs after having used the following drugs: Marijuana. He reports that he does not drink alcohol.  Physical Exam: BP (!) 170/103   Pulse 96   Temp 99 F (37.2 C) (Temporal)   Resp 20   Ht 5' 9.5" (1.765 m)   Wt 72.5 kg   SpO2 96%   BMI 23.26 kg/m    Constitutional:  Alert and oriented, No acute distress. Cardiovascular: Regular rate and rhythm Respiratory: Clear to auscultation bilaterally GI: Abdomen is soft, nontender, nondistended, no abdominal masses  Laboratory Data: Urine culture 11/17/2022 with Serratia, treated with culture appropriate Bactrim  Assessment & Plan:   82 year old male with Foley dependent urinary retention, BPH, bladder stones, who opted for cystolitholapaxy and HOLEP to resume spontaneous  voiding.  We discussed the risks and benefits of HoLEP at length.  The procedure requires general anesthesia and takes 1 to 2 hours, and a holmium laser is used to enucleate the prostate and push this tissue into the bladder.  A morcellator is then used to remove this tissue, which is sent for pathology.  The vast majority(>95%) of patients are able to discharge the same day with a catheter in place for 2 to 3 days, and will follow-up in clinic for a voiding trial.  We specifically discussed the risks of bleeding, infection, retrograde ejaculation, temporary urgency and urge incontinence, very low risk of long-term incontinence, urethral stricture/bladder neck contracture, pathologic evaluation of prostate tissue and possible detection of prostate cancer or other malignancy, and possible need for additional procedures.  Cystolitholapaxy and HOLEP today  Nickolas Madrid, MD 11/26/2022  [facility] [address] [place], [place] [address] [phone]

## 2022-11-26 NOTE — Transfer of Care (Signed)
Immediate Anesthesia Transfer of Care Note  Patient: Joe Hardy  Procedure(s) Performed: HOLEP-LASER ENUCLEATION OF THE PROSTATE WITH MORCELLATION CYSTOSCOPY WITH LITHOLAPAXY  Patient Location: PACU  Anesthesia Type:General  Level of Consciousness: sedated and responds to stimulation  Airway & Oxygen Therapy: Patient Spontanous Breathing and Patient connected to face mask oxygen  Post-op Assessment: Report given to RN and Post -op Vital signs reviewed and stable  Post vital signs: Reviewed and stable  Last Vitals:  Vitals Value Taken Time  BP 141/85 11/26/22 1050  Temp    Pulse 109 11/26/22 1051  Resp 19 11/26/22 1054  SpO2 98% 11/26/22 1051  Vitals shown include unvalidated device data.  Last Pain:  Vitals:   11/26/22 0808  TempSrc: Temporal  PainSc: 3       Patients Stated Pain Goal: 0 ([id number])  Complications: No notable events documented.

## 2022-11-26 NOTE — Op Note (Signed)
Date of procedure: 11/26/22  Preoperative diagnosis:  Bladder stones BPH with obstruction  Postoperative diagnosis:  Same  Procedure: Cystolitholapaxy, >2.5cm HoLEP (Holmium Laser Enucleation of the Prostate)  Surgeon: Nickolas Madrid, MD  Anesthesia: General  Complications: None  Intraoperative findings:  Numerous bladder stones, largest ~3cm, all fragmented to dust and irrigated free Moderate to severe trabeculations, no suspicious lesions, ureteral orifices orthotopic bilaterally Uncomplicated HOLEP, ureteral orifices and verumontanum intact at conclusion of case  EBL: Minimal  Specimens: Prostate chips  Enucleation time: 34 minutes  Morcellation time: 12 minutes  Intra-op weight: 79g  Drains: 24 French three-way, 60 cc in balloon  Indication: Joe Hardy is a 82 y.o. patient with urinary retention and BPH with numerous bladder stones who failed voiding trials and opted for cystolitholapaxy and HOLEP.  After reviewing the management options for treatment, they elected to proceed with the above surgical procedure(s). We have discussed the potential benefits and risks of the procedure, side effects of the proposed treatment, the likelihood of the patient achieving the goals of the procedure, and any potential problems that might occur during the procedure or recuperation.  We specifically discussed the risks of bleeding, infection, hematuria and clot retention, need for additional procedures, possible overnight hospital stay, temporary urgency and incontinence, rare long-term incontinence, and retrograde ejaculation.  Informed consent has been obtained.   Description of procedure:  The patient was taken to the operating room and general anesthesia was induced.  The patient was placed in the dorsal lithotomy position, prepped and draped in the usual sterile fashion, and preoperative antibiotics(Cipro) were administered.  SCDs were placed for DVT prophylaxis.  A preoperative  time-out was performed.   Joe Hardy sounds were used to gently dilated the urethra up to 48F. The 9 French continuous flow resectoscope was inserted into the urethra using the visual obturator  The prostate was large with a large median lobe and an elevated bladder neck. The bladder was thoroughly inspected and notable for numerous bladder stones, largest measuring 3 cm, moderate trabeculations.  The ureteral orifices were located in orthotopic position.  The laser was set to 2.5 J and 20 Hz and used to methodically fragment all stones, and all fragments were irrigated free from the bladder.  The ureteral orifices were intact without any evidence of damage.  The laser was set to 2 J and 60 Hz and early apical release was performed by making a circumferential mucosal incision proximal to the sphincter.  A lambda incision was then made proximal to the verumontanum.  The prostate was enucleated en bloc circumferentially into the bladder.  The capsule was examined and laser was used for meticulous hemostasis.    The 35 French resectoscope was then switched out for the 26 French nephroscope and prostate tissue was morcellated(Piranha) and the tissue sent to pathology.  A 24 French three-way catheter was inserted easily with the aid of a catheter guide, and 60 cc were placed in the balloon.  Urine was pink.  The catheter irrigated easily with a To[place]y syringe.  CBI was initiated.   The patient tolerated the procedure well without any immediate complications and was extubated and transferred to the recovery room in stable condition.  Urine was faint pink on fast CBI.  Disposition: Stable to PACU  Plan: Wean CBI in PACU, anticipate discharge home today with foley removal in clinic in 2-3 days  Nickolas Madrid, MD 11/26/2022

## 2022-11-26 NOTE — Progress Notes (Signed)
Notified Dr. Diamantina Providence that patient CBI was weaned according to order. Notified him that the urine returned to thick ketchup like consistency. Dr. Diamantina Providence will be in after his OR case.

## 2022-11-26 NOTE — Anesthesia Postprocedure Evaluation (Signed)
Anesthesia Post Note  Patient: Joe Hardy  Procedure(s) Performed: HOLEP-LASER ENUCLEATION OF THE PROSTATE WITH MORCELLATION CYSTOSCOPY WITH LITHOLAPAXY  Patient location during evaluation: PACU Anesthesia Type: General Level of consciousness: awake and alert Pain management: pain level controlled Vital Signs Assessment: post-procedure vital signs reviewed and stable Respiratory status: spontaneous breathing, nonlabored ventilation, respiratory function stable and patient connected to nasal cannula oxygen Cardiovascular status: blood pressure returned to baseline and stable Postop Assessment: no apparent nausea or vomiting Anesthetic complications: no  No notable events documented.   Last Vitals:  Vitals:   11/26/22 1100 11/26/22 1115  BP: 104/84 (!) 149/80  Pulse: 83 84  Resp: 15 19  Temp: 36.8 C   SpO2: 100% 99%    Last Pain:  Vitals:   11/26/22 1115  TempSrc:   PainSc: 0-No pain                 Dimas Millin

## 2022-11-27 ENCOUNTER — Emergency Department
Admission: EM | Admit: 2022-11-27 | Discharge: 2022-11-27 | Disposition: A | Discharge status: Home / Self Care | Payer: 59 | Attending: Emergency Medicine | Admitting: Emergency Medicine

## 2022-11-27 ENCOUNTER — Encounter: Payer: Self-pay | Admitting: Urology

## 2022-11-27 ENCOUNTER — Other Ambulatory Visit: Payer: Self-pay

## 2022-11-27 DIAGNOSIS — R58 Hemorrhage, not elsewhere classified: Secondary | ICD-10-CM | POA: Diagnosis not present

## 2022-11-27 DIAGNOSIS — R6889 Other general symptoms and signs: Secondary | ICD-10-CM | POA: Diagnosis not present

## 2022-11-27 DIAGNOSIS — Z8616 Personal history of COVID-19: Secondary | ICD-10-CM | POA: Diagnosis not present

## 2022-11-27 DIAGNOSIS — T8383XD Hemorrhage of genitourinary prosthetic devices, implants and grafts, subsequent encounter: Secondary | ICD-10-CM | POA: Insufficient documentation

## 2022-11-27 DIAGNOSIS — I10 Essential (primary) hypertension: Secondary | ICD-10-CM | POA: Diagnosis not present

## 2022-11-27 DIAGNOSIS — Z885 Allergy status to narcotic agent status: Secondary | ICD-10-CM | POA: Diagnosis not present

## 2022-11-27 DIAGNOSIS — Z743 Need for continuous supervision: Secondary | ICD-10-CM | POA: Diagnosis not present

## 2022-11-27 DIAGNOSIS — K219 Gastro-esophageal reflux disease without esophagitis: Secondary | ICD-10-CM | POA: Diagnosis not present

## 2022-11-27 DIAGNOSIS — K7460 Unspecified cirrhosis of liver: Secondary | ICD-10-CM | POA: Diagnosis not present

## 2022-11-27 DIAGNOSIS — T83091A Other mechanical complication of indwelling urethral catheter, initial encounter: Secondary | ICD-10-CM | POA: Diagnosis not present

## 2022-11-27 DIAGNOSIS — Y732 Prosthetic and other implants, materials and accessory gastroenterology and urology devices associated with adverse incidents: Secondary | ICD-10-CM | POA: Insufficient documentation

## 2022-11-27 DIAGNOSIS — T839XXA Unspecified complication of genitourinary prosthetic device, implant and graft, initial encounter: Secondary | ICD-10-CM

## 2022-11-27 DIAGNOSIS — R310 Gross hematuria: Secondary | ICD-10-CM | POA: Insufficient documentation

## 2022-11-27 DIAGNOSIS — Z881 Allergy status to other antibiotic agents status: Secondary | ICD-10-CM | POA: Diagnosis not present

## 2022-11-27 DIAGNOSIS — K5730 Diverticulosis of large intestine without perforation or abscess without bleeding: Secondary | ICD-10-CM | POA: Diagnosis not present

## 2022-11-27 DIAGNOSIS — Z79899 Other long term (current) drug therapy: Secondary | ICD-10-CM | POA: Diagnosis not present

## 2022-11-27 LAB — CBC WITH DIFFERENTIAL/PLATELET
Abs Immature Granulocytes: 0.13 10*3/uL — ABNORMAL HIGH (ref 0.00–0.07)
Basophils Absolute: 0 10*3/uL (ref 0.0–0.1)
Basophils Relative: 0 %
Eosinophils Absolute: 0.1 10*3/uL (ref 0.0–0.5)
Eosinophils Relative: 1 %
HCT: 34.3 % — ABNORMAL LOW (ref 39.0–52.0)
Hemoglobin: 11.1 g/dL — ABNORMAL LOW (ref 13.0–17.0)
Immature Granulocytes: 1 %
Lymphocytes Relative: 18 %
Lymphs Abs: 2 10*3/uL (ref 0.7–4.0)
MCH: 21.8 pg — ABNORMAL LOW (ref 26.0–34.0)
MCHC: 32.4 g/dL (ref 30.0–36.0)
MCV: 67.4 fL — ABNORMAL LOW (ref 80.0–100.0)
Monocytes Absolute: 1 10*3/uL (ref 0.1–1.0)
Monocytes Relative: 9 %
Neutro Abs: 7.8 10*3/uL — ABNORMAL HIGH (ref 1.7–7.7)
Neutrophils Relative %: 71 %
Platelets: 292 10*3/uL (ref 150–400)
RBC: 5.09 MIL/uL (ref 4.22–5.81)
RDW: 16.2 % — ABNORMAL HIGH (ref 11.5–15.5)
Smear Review: NORMAL
WBC: 11.1 10*3/uL — ABNORMAL HIGH (ref 4.0–10.5)
nRBC: 0 % (ref 0.0–0.2)

## 2022-11-27 LAB — URINALYSIS, ROUTINE W REFLEX MICROSCOPIC
RBC / HPF: >50 RBC/hpf (ref 0–5)
Specific Gravity, Urine: 1.011 (ref 1.005–1.030)
Squamous Epithelial / HPF: NONE SEEN /HPF (ref 0–5)
WBC, UA: >50 WBC/hpf (ref 0–5)

## 2022-11-27 NOTE — ED Notes (Addendum)
Per Manuela Schwartz PA, keep pt in room for a little while longer. Pt has friend who will come around 3pm. Pt will need to have urine bag emptied again before leaves. Will discharge pt to lobby around 2pm.

## 2022-11-27 NOTE — ED Triage Notes (Signed)
Pt presents from home via EMS. Pt had bladder stones removed yesterday and foley catheter was placed. Pt's catheter pulled out as pt was trying to empty bag, now hematuria no pain, pt pushed catheter back in.  Pt talks in complete sentences no distress noted.

## 2022-11-27 NOTE — ED Provider Notes (Signed)
[facility] Provider Note    Event Date/Time   First MD Initiated Contact with Patient 11/27/22 1102     (approximate)   History   foley catheter placement   HPI  Joe Hardy is a 82 y.o. male with history of hypertension, bladder stones, hyperlipidemia, iron deficiency anemia presents emergency department complaining of his Foley catheter falling out.  Patient had some bladder stones removed yesterday and they placed a Foley catheter.  Patient states he has had of lot of blood in the catheter bag.  Is concerned that we need to readjust the catheter or replace it.  Also concerned that he is lost a lot of blood.      Physical Exam   Triage Vital Signs: ED Triage Vitals  Enc Vitals Group     BP 11/27/22 1026 (!) 140/79     Pulse Rate 11/27/22 1026 88     Resp 11/27/22 1026 16     Temp 11/27/22 1026 98.4 F (36.9 C)     Temp Source 11/27/22 1026 Oral     SpO2 11/27/22 1026 96 %     Weight 11/27/22 1027 159 lb 13.3 oz (72.5 kg)     Height 11/27/22 1027 5' 9.5" (1.765 m)     Head Circumference --      Peak Flow --      Pain Score 11/27/22 1026 0     Pain Loc --      Pain Edu? --      Excl. in [place]? --     Most recent vital signs: Vitals:   11/27/22 1026  BP: (!) 140/79  Pulse: 88  Resp: 16  Temp: 98.4 F (36.9 C)  SpO2: 96%     General: Awake, no distress.   CV:  Good peripheral perfusion. regular rate and  rhythm Resp:  Normal effort.  Abd:  No distention.   Other:      ED Results / Procedures / Treatments   Labs (all labs ordered are listed, but only abnormal results are displayed) Labs Reviewed  URINALYSIS, ROUTINE W REFLEX MICROSCOPIC - Abnormal; Notable for the following components:      Result Value   Color, Urine RED (*)    APPearance CLOUDY (*)    Glucose, UA   (*)    Value: TEST NOT REPORTED DUE TO COLOR INTERFERENCE OF URINE PIGMENT   Hgb urine dipstick   (*)    Value: TEST NOT REPORTED DUE TO COLOR INTERFERENCE  OF URINE PIGMENT   Bilirubin Urine   (*)    Value: TEST NOT REPORTED DUE TO COLOR INTERFERENCE OF URINE PIGMENT   Ketones, ur   (*)    Value: TEST NOT REPORTED DUE TO COLOR INTERFERENCE OF URINE PIGMENT   Protein, ur   (*)    Value: TEST NOT REPORTED DUE TO COLOR INTERFERENCE OF URINE PIGMENT   Nitrite   (*)    Value: TEST NOT REPORTED DUE TO COLOR INTERFERENCE OF URINE PIGMENT   Leukocytes,Ua   (*)    Value: TEST NOT REPORTED DUE TO COLOR INTERFERENCE OF URINE PIGMENT   Bacteria, UA MANY (*)    All other components within normal limits  CBC WITH DIFFERENTIAL/PLATELET - Abnormal; Notable for the following components:   WBC 11.1 (*)    Hemoglobin 11.1 (*)    HCT 34.3 (*)    MCV 67.4 (*)    MCH 21.8 (*)    RDW 16.2 (*)  Neutro Abs 7.8 (*)    Abs Immature Granulocytes 0.13 (*)    All other components within normal limits     EKG     RADIOLOGY     PROCEDURES:   Procedures   MEDICATIONS ORDERED IN ED: Medications - No data to display   IMPRESSION / MDM / [place] / ED COURSE  I reviewed the triage vital signs and the nursing notes.                              Differential diagnosis includes, but is not limited to, Foley catheter care, hematuria, anemia  Patient's presentation is most consistent with acute complicated illness / injury requiring diagnostic workup.   Nursing staff to replace the catheter, will do a UA and CBC as patient is very concerned that he is lost too much blood and has a history of low hemoglobin.   Blood cell counts are normal, UA shows many bacteria but patient is currently on Bactrim.  Will not alter his medication.  I did explain findings to the patient.  He was discharged stable condition.  Follow-up with his regular doctor.   FINAL CLINICAL IMPRESSION(S) / ED DIAGNOSES   Final diagnoses:  Complication of Foley catheter, initial encounter ([place])     Rx / DC Orders   ED Discharge Orders     None         Note:  This document was prepared using Dragon voice recognition software and may include unintentional dictation errors.    Versie Starks, PA-C 11/27/22 1229    Blake Divine, MD 11/27/22 [phone]

## 2022-11-27 NOTE — ED Notes (Signed)
This RN and other RN removed 30cc from foley balloon and replaced. Catheter is in place. Pt has 24 fr foley catheter that was placed yesterday. This ED does not keep 24 fr foleys. Informed PA. Bag attached to foley was changed. Foley is draining well. Urine in new bag is lighter red than first bag. Pt states that he had removed leg straps last night on leg bag. Walked around hallway with pt and catheter did not fall out.

## 2022-11-27 NOTE — ED Triage Notes (Signed)
First Nurse Note:  Pt via [facility] from home. Pt had bladder stones removed yesterday and had a catheter placed, stated that he stood up this AM and his catheter came out a little. Pt tried to put the catheter back in. EMS reports about 10-85mL of blood loss from his penis. Denies blood thinners. Pt is A&Ox4 and NAD EMS reports: 161/88 BP, 88 HR, 98% on RA

## 2022-11-27 NOTE — ED Notes (Signed)
Catheter appears to be in place. Minimal amount of blood-tinged urine in leg bag. Pt states was emptying bag frequently all night. Pt is alert, oriented.

## 2022-11-27 NOTE — ED Notes (Signed)
Per Manuela Schwartz PA, wait to place new foley because foley that is in place appears intact.

## 2022-11-28 ENCOUNTER — Emergency Department
Admission: EM | Admit: 2022-11-28 | Discharge: 2022-11-28 | Disposition: A | Discharge status: Home / Self Care | Payer: 59 | Source: Home / Self Care | Attending: Emergency Medicine | Admitting: Emergency Medicine

## 2022-11-28 DIAGNOSIS — T83091A Other mechanical complication of indwelling urethral catheter, initial encounter: Secondary | ICD-10-CM | POA: Diagnosis not present

## 2022-11-28 DIAGNOSIS — R310 Gross hematuria: Secondary | ICD-10-CM

## 2022-11-28 DIAGNOSIS — T839XXD Unspecified complication of genitourinary prosthetic device, implant and graft, subsequent encounter: Secondary | ICD-10-CM

## 2022-11-28 LAB — URINALYSIS, ROUTINE W REFLEX MICROSCOPIC
Bacteria, UA: NONE SEEN
RBC / HPF: >50 RBC/hpf (ref 0–5)
Specific Gravity, Urine: 1.02 (ref 1.005–1.030)
Squamous Epithelial / HPF: NONE SEEN /HPF (ref 0–5)
WBC, UA: >50 WBC/hpf (ref 0–5)

## 2022-11-28 MED ORDER — LIDOCAINE HCL (PF) 1 % IJ SOLN: 2.0000 mL | Freq: Once | INTRAMUSCULAR | Status: DC

## 2022-11-28 MED ORDER — SODIUM CHLORIDE 0.9 % IR SOLN
3000.0000 mL | Status: DC
  Administered 2022-11-28: 3000 mL

## 2022-11-28 MED ORDER — CEFTRIAXONE SODIUM 1 G IJ SOLR
1.0000 g | Freq: Once | INTRAMUSCULAR | Status: DC
  Filled 2022-11-28: qty 10

## 2022-11-28 MED ORDER — STERILE WATER FOR IRRIGATION IR SOLN: Freq: Once | Status: AC

## 2022-11-28 NOTE — ED Notes (Signed)
Pt's catheter has the appropriate amount of solution in it and feels secure. MD Karma Greaser notified and sts to hold off on ABX at this time.

## 2022-11-28 NOTE — ED Notes (Signed)
Bladder irrigation started

## 2022-11-28 NOTE — ED Provider Notes (Signed)
[facility] Provider Note    Event Date/Time   First MD Initiated Contact with Patient 11/28/22 [phone]     (approximate)   History   Post-op Problem   HPI  Joe Hardy is a 82 y.o. male who had cystolitholopaxy and HoLEP procedures about 2 days ago by Dr. Diamantina Providence.  He was discharged with a large Foley catheter in place with plans to have removed in about 3 days.  He was seen yesterday in the emergency department with concerns about bleeding from and around the catheter.  It seemed to be intact and he was discharged back home.  He said that he had more problems tonight and he is convinced that the catheter came out and that he had to jam it back in.  He claims it came "all the way out".  And he had a lot of blood coming out the urethra and/or the catheter itself.  He continues to have drainage into his leg bag and said it is mostly blood.  He is not in any significant pain but is worried about the catheter.     Physical Exam   Triage Vital Signs: ED Triage Vitals  Enc Vitals Group     BP 11/27/22 2204 130/82     Pulse Rate 11/27/22 2204 89     Resp 11/27/22 2204 16     Temp 11/27/22 2204 98.6 F (37 C)     Temp Source 11/27/22 2204 Oral     SpO2 11/27/22 2204 94 %     Weight 11/27/22 2206 70.3 kg (155 lb)     Height 11/27/22 2206 1.753 m (5\' 9" )     Head Circumference --      Peak Flow --      Pain Score 11/27/22 2205 0     Pain Loc --      Pain Edu? --      Excl. in [place]? --     Most recent vital signs: Vitals:   11/28/22 0600 11/28/22 0630  BP: 121/86 124/85  Pulse: 92 89  Resp: 18 16  Temp:    SpO2: 100% 97%     General: Awake, generally well-appearing for age and medical issues.  No distress. CV:  Good peripheral perfusion.  Regular rate and rhythm. Resp:  Normal effort. Speaking easily and comfortably, no accessory muscle usage nor intercostal retractions.   Abd:  No distention.  GU:  Large caliber Foley catheter in place.   Evidence of dried blood surrounding it and on the patient's hands but no active bleeding at the urethral meatus.  Patient has gross hematuria flowing into the leg bag.   ED Results / Procedures / Treatments   Labs (all labs ordered are listed, but only abnormal results are displayed) Labs Reviewed  URINALYSIS, ROUTINE W REFLEX MICROSCOPIC - Abnormal; Notable for the following components:      Result Value   Color, Urine RED (*)    APPearance TURBID (*)    Glucose, UA   (*)    Value: TEST NOT REPORTED DUE TO COLOR INTERFERENCE OF URINE PIGMENT   Hgb urine dipstick   (*)    Value: TEST NOT REPORTED DUE TO COLOR INTERFERENCE OF URINE PIGMENT   Bilirubin Urine   (*)    Value: TEST NOT REPORTED DUE TO COLOR INTERFERENCE OF URINE PIGMENT   Ketones, ur   (*)    Value: TEST NOT REPORTED DUE TO COLOR INTERFERENCE OF URINE PIGMENT   Protein,  ur   (*)    Value: TEST NOT REPORTED DUE TO COLOR INTERFERENCE OF URINE PIGMENT   Nitrite   (*)    Value: TEST NOT REPORTED DUE TO COLOR INTERFERENCE OF URINE PIGMENT   Leukocytes,Ua   (*)    Value: TEST NOT REPORTED DUE TO COLOR INTERFERENCE OF URINE PIGMENT   All other components within normal limits  URINE CULTURE       PROCEDURES:  Critical Care performed: No  Procedures   MEDICATIONS ORDERED IN ED: Medications  sodium chloride irrigation 0.9 % 3,000 mL (0 mLs Irrigation Stopped 11/28/22 0547)  sterile water for irrigation for irrigation ( Bladder Instillation Given 11/28/22 0548)     IMPRESSION / MDM / ASSESSMENT AND PLAN / ED COURSE  I reviewed the triage vital signs and the nursing notes.                              Differential diagnosis includes, but is not limited to, catheter malfunction, acquired UTI.  Patient's presentation is most consistent with acute complicated illness / injury requiring diagnostic workup.  Labs/studies ordered: Urinalysis, urine culture Interventions/Medications given: Continuous bladder  irrigation [facility] Course my include additional interventions or labs/studies not listed above.)  I reviewed extensively the note from Dr. Diamantina Providence from 11/26/2022 explaining the patient's procedure, the large caliber Foley catheter placed by him after the procedures, etc.  I see that the patient is supposed to return in about another day to the urology clinic to probably have the catheter removed.  The patient has been having issues with the catheter but I find it very unlikely that a 24 [place] three-way catheter with a 60 mL balloon came all the way out of his penis and he pushed it back in.  It seems stable at this time.  His nurse, Alyssa, also tested it by deflating the balloon and verifying the appropriate quantity of fluid and also adjusting and pulling on the catheter to make sure it was secure.  It seems that the patient most likely was concerned that it was coming out when he felt it moving but it did not actually come all the way out.  Initially I ordered Rocephin to be administered intramuscularly when I thought that it had come out completely, but after the reassuring evaluation of Foley, I do not think it is likely necessary.  However, given that the patient is recently postop, I will consult by phone with urology to discuss the case and see if they have any other specific recommendations.     Clinical Course as of 11/28/22 0809  Sun Nov 28, 2022  0332 paged Dr. Bernardo Heater [CF]  [phone] Consulted by phone with Dr. Bernardo Heater.  I explained the situation and he recommended gentle bladder irrigation but agree that we should not remove the catheter since the balloon is intact.  The patient's nurse engaged in education about the Foley and we are providing continuous bladder irrigation with the goal of clearing some of the gross hematuria.  Dr. Bernardo Heater also agree that no additional antibiotics are needed at this time. [CF]  [phone] Nurse Alyssa started CBI a few minutes ago [CF]  0650 Patient should be  able to find a ride home soon (he cannot get 1 overnight) we continued bladder irrigation to try to improve the gross hematuria.  But he is appropriate for discharge at this time and he has received education about the Foley catheter and  will follow-up in clinic. [CF]    Clinical Course User Index [CF] Hinda Kehr, MD     FINAL CLINICAL IMPRESSION(S) / ED DIAGNOSES   Final diagnoses:  Foley catheter problem, subsequent encounter  Gross hematuria     Rx / DC Orders   ED Discharge Orders     None        Note:  This document was prepared using Dragon voice recognition software and may include unintentional dictation errors.   Hinda Kehr, MD 11/28/22 [phone]

## 2022-11-28 NOTE — ED Notes (Signed)
Report received, this RN now assuming care. 

## 2022-11-28 NOTE — ED Notes (Signed)
Pt given discharge instructions, pt voiced understanding of instructions. Pt unable to sign due pen pad not working.

## 2022-11-29 ENCOUNTER — Encounter: Payer: 59 | Admitting: Physician Assistant

## 2022-11-29 ENCOUNTER — Telehealth: Payer: Self-pay | Admitting: *Deleted

## 2022-11-29 LAB — URINE CULTURE: Culture: NO GROWTH

## 2022-11-29 NOTE — Telephone Encounter (Signed)
Pt calling due to having problems with his catheter over the weekend, pt has went to ER twice this weekend. Pt canceled appt today. I have tried to get him scheduled in [place] on Tuesday or Wednesday morning. Pt will contact his support person to see when he can come. Pt has made several phone calls and called after hours on-call. Pt will call back to schedule.

## 2022-11-29 NOTE — Telephone Encounter (Signed)
Appt scheduled for [place] on Wednesday 12/01/22

## 2022-11-30 ENCOUNTER — Other Ambulatory Visit: Payer: 59

## 2022-11-30 ENCOUNTER — Ambulatory Visit: Payer: 59 | Admitting: Internal Medicine

## 2022-11-30 ENCOUNTER — Telehealth: Payer: Self-pay | Admitting: *Deleted

## 2022-11-30 LAB — SURGICAL PATHOLOGY

## 2022-11-30 NOTE — Transitions of Care (Post Inpatient/ED Visit) (Signed)
   11/30/2022  Name: Joe Hardy MRN: [id number] DOB: 05-02-1941  Today's TOC FU Call Status: Today's TOC FU Call Status:: Successful TOC FU Call Competed TOC FU Call Complete Date: 11/30/22  Transition Care Management Follow-up Telephone Call Date of Discharge: 11/28/22 Discharge Facility: [facility]) Type of Discharge: Emergency Department (foley catheter problem) Reason for ED Visit: Renal How have you been since you were released from the hospital?: Better Any questions or concerns?: No  Items Reviewed: Did you receive and understand the discharge instructions provided?: Yes Any new allergies since your discharge?: No Dietary orders reviewed?: No Do you have support at home?: No  Home Care and Equipment/Supplies: [place] Ordered?: No Any new equipment or medical supplies ordered?: No  Functional Questionnaire: Do you need assistance with bathing/showering or dressing?: No Do you need assistance with meal preparation?: No Do you need assistance with eating?: No Do you have difficulty maintaining continence: No Do you need assistance with getting out of bed/getting out of a chair/moving?: No Do you have difficulty managing or taking your medications?: No  Follow up appointments reviewed: PCP Follow-up appointment confirmed?: NA Specialist Hospital Follow-up appointment confirmed?: Yes Date of Specialist follow-up appointment?: 12/01/22 Follow-Up Specialty Provider:: Dr Diamantina Providence urology Do you need transportation to your follow-up appointment?: No Do you understand care options if your condition(s) worsen?: Yes-patient verbalized understanding  SDOH Interventions Today    Flowsheet Row Most Recent Value  SDOH Interventions   Food Insecurity Interventions Intervention Not Indicated  Housing Interventions Intervention Not Indicated  Transportation Interventions Intervention Not Indicated      Interventions Today     Flowsheet Row Most Recent Value  General Interventions   General Interventions Discussed/Reviewed Doctor Visits, General Interventions Discussed, General Interventions Reviewed  Doctor Visits Discussed/Reviewed Doctor Visits Discussed, Doctor Visits Reviewed       [facility] Interventions Today    Flowsheet Row Most Recent Value  TOC Interventions   TOC Interventions Discussed/Reviewed TOC Interventions Discussed, TOC Interventions Reviewed       [place] Management [phone]

## 2022-12-01 ENCOUNTER — Inpatient Hospital Stay: Payer: 59 | Admitting: Internal Medicine

## 2022-12-01 ENCOUNTER — Inpatient Hospital Stay: Payer: 59

## 2022-12-01 ENCOUNTER — Telehealth: Payer: Self-pay | Admitting: Urology

## 2022-12-01 ENCOUNTER — Encounter: Payer: Self-pay | Admitting: Urology

## 2022-12-01 ENCOUNTER — Ambulatory Visit (INDEPENDENT_AMBULATORY_CARE_PROVIDER_SITE_OTHER): Payer: 59 | Admitting: Urology

## 2022-12-01 VITALS — BP 141/74 | HR 126 | Temp 98.0°F

## 2022-12-01 DIAGNOSIS — N138 Other obstructive and reflux uropathy: Secondary | ICD-10-CM

## 2022-12-01 DIAGNOSIS — N401 Enlarged prostate with lower urinary tract symptoms: Secondary | ICD-10-CM

## 2022-12-01 NOTE — Progress Notes (Signed)
   12/01/2022 12:20 PM   Joe Hardy October 11, 1940 [id number]  Reason for visit: Follow up HOLEP  HPI: 82 year old male with urinary retention and multiple bladder stones, prostate measured 96 g on CT who underwent cystolitholapaxy and HOLEP on 11/26/2022.  He has some dementia at baseline as well as a poor social support network, and had multiple ER visits over the weekend for really unclear reasons.  He is saying multiple times that his catheter " fell out", however on my review of his multiple ER notes, the catheter was never replaced.  On exam he has the original 24 [place] three-way Foley in place with light tea colored urine.  I was able to deflate 60 mL from the balloon and the catheter was removed.  Reassurance was provided.  Extensive counseling spent regarding expectation for intermittent hematuria over the next 1 to 2 weeks as well as incontinence that may take up to 2 to 4 months to completely resolve, especially in the setting of his longstanding obstruction.  Notably, CT for initial hematuria workup does show a solitary pathologically enlarged node in the left hemipelvis, PSA was normal at 1.99.  Pathology today is still pending, he also has follow-up set up with oncology regarding enlarged lymph node in the left pelvis.  Will call with pathology results, RTC 3 months for PVR   Billey Co, MD  [facility] [address] [place], [place] [address] [phone]

## 2022-12-01 NOTE — Telephone Encounter (Signed)
Urology telephone note  82 year old male who presented with urinary retention and gross hematuria, CT urogram 11/09/2022 showed enlarged prostate with bladder stones and Foley catheter in place, as well as a solitary pathologically enlarged node along the anterior left pelvis.  He underwent cystoscopy 11/17/2022 in clinic that showed an enlarged prostate, multiple bladder stones, some erythema at the posterior bladder consistent with chronic catheter, but no other abnormalities.  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  I had a long conversation with the patient today about these findings and new diagnosis of urothelial carcinoma, as well as unique location within the prostate as well as the muscle invasion equating to stage II disease, and finally my concern that the pathologically enlarged lymph node represents metastatic disease.  He already has an appointment scheduled with oncology 12/06/2022 to discuss options moving forward.   He is relatively frail and lives alone, I think we need to have realistic expectations moving forward about treatment strategies.  I do not think he would be a candidate for radical cystoprostatectomy and urinary diversion.  Renal function is normal, and he may be a candidate for cisplatin based chemotherapy and radiation.  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

## 2022-12-02 ENCOUNTER — Ambulatory Visit: Payer: Self-pay | Admitting: *Deleted

## 2022-12-02 NOTE — Patient Instructions (Signed)
Visit Information  Thank you for taking time to visit with me today. Please don't hesitate to contact me if I can be of assistance to you.   Following are the goals we discussed today:   Goals Addressed             This Visit's Progress    Managing emotions       Activities and task to complete in order to accomplish goals.   EMOTIONAL / [place] with compliance of taking medication prescribed by [facility] Support options-continue to work with [employer] community care-continue to follow up with [facility] for medication management-re-start mental health counseling if needed .         If you are experiencing a Mental Health or [place] or need someone to talk to, please call the Suicide and Crisis Lifeline: 988   Patient verbalizes understanding of instructions and care plan provided today and agrees to view in [place]. Active MyChart status and patient understanding of how to access instructions and care plan via MyChart confirmed with patient.     No further follow up required: patient to contact this Education officer, museum with any additional community resource needs.  Elliot Gurney, [place] Worker  [facility] Care Management [phone]

## 2022-12-02 NOTE — Patient Outreach (Signed)
  Care Coordination   Initial Visit Note   12/02/2022 Name: Joe Hardy MRN: [id number] DOB: 1941-03-29  Joe Hardy is a 82 y.o. year old male who sees Baity, Coralie Keens, NP for primary care. I spoke with  Denny Levy by phone today.  What matters to the patients health and wellness today?  Patient sates feeling much better both emotionally and physically. Patient verbalized multiple ED visits related to his catheter but now issue has seemed to resolve. Confirms calling the mental health crisis support line recently which was very beneficial. Patient discussed having limited support but does have a support worker through [facility] that assists with his IADL's.  Patient followed quarterly by [facility] for medication management. No longer sees a therapist and states that he no longer feels that he needs mental health follow up.   Goals Addressed             This Visit's Progress    Managing emotions       Activities and task to complete in order to accomplish goals.   EMOTIONAL / [place] with compliance of taking medication prescribed by [facility] Support options-continue to work with [employer] community care-continue to follow up with [facility] for medication management-re-start mental health counseling if needed .         SDOH assessments and interventions completed:  Yes  SDOH Interventions Today    Flowsheet Row Most Recent Value  SDOH Interventions   Food Insecurity Interventions Intervention Not Indicated  Housing Interventions Intervention Not Indicated  Transportation Interventions Intervention Not Indicated  [has a support person that transports him to his appointments]        Care Coordination Interventions:  Yes, provided  Interventions Today    Flowsheet Row Most Recent Value  Chronic Disease   Chronic disease during today's visit --  [lower urinary tract symptoms]  General Interventions    General Interventions Discussed/Reviewed General Interventions Discussed, Museum/gallery exhibitions officer confirms having a Architectural technologist through [place] Reviewed, Mental Health Discussed, Coping Strategies  [patient confirms history of mental health support through [facility] that ended in 2020-currenlty being followed by psychiatrist for medication management quarterly]  Safety Interventions   Safety Discussed/Reviewed Safety Discussed  [patient to contact [phone] in the event of a mental health emergency]       Follow up plan: No further intervention required.   Encounter Outcome:  Pt. Visit Completed

## 2022-12-06 ENCOUNTER — Ambulatory Visit: Payer: Self-pay | Admitting: *Deleted

## 2022-12-06 ENCOUNTER — Inpatient Hospital Stay: Payer: 59

## 2022-12-06 ENCOUNTER — Inpatient Hospital Stay: Payer: 59 | Admitting: Internal Medicine

## 2022-12-06 ENCOUNTER — Ambulatory Visit: Payer: Self-pay

## 2022-12-06 NOTE — Telephone Encounter (Signed)
  Chief Complaint: Right testicle swelling and pain Symptoms: above Frequency: Yesterday for pain, Swelling has been ongoing Pertinent Negatives: Patient denies Fever Disposition: [x] ED /[] Urgent Care (no appt availability in office) / [] Appointment(In office/virtual)/ []  [place]/ [] Home Care/ [] Refused Recommended Disposition /[] [place] [facility]/ []  Follow-up with PCP Additional Notes: Pt states that his right testicle has been swollen for a long time, but started hurting yesterday. Pt states that pain depends upon position. I certain positions there is no pain at all, in other positions testicle is very painful. Pt is still recovering from bladder sx. And feels weak.  Pt will call EMS, rather than drive himself to ED.    Reason for Disposition  SEVERE pain (e.g., excruciating)  Patient sounds very sick or weak to the triager  [1] Constant pain in scrotum or testicle AND [2] present > 1 hour  Answer Assessment - Initial Assessment Questions 1. LOCATION and RADIATION: "Where is the pain located?"      Right testicle 2. QUALITY: "What does the pain feel like?"  (e.g., sharp, dull, aching, burning)     Sharp 3. SEVERITY: "How bad is the pain?"  (Scale 1-10; or mild, moderate, severe)   - MILD (1-3): doesn't interfere with normal activities    - MODERATE (4-7): interferes with normal activities (e.g., work or school) or awakens from sleep   - SEVERE (8-10): excruciating pain, unable to do any normal activities, difficulty walking     1-10/10 4. ONSET: "When did the pain start?"     Over the weekend 5. PATTERN: "Does it come and go, or has it been constant since it started?"     Comes and goes 6. SCROTAL APPEARANCE: "What does the scrotum look like?" "Is there any swelling or redness?"      swollen 7. HERNIA: "Has a doctor ever told you that you have a hernia?"     No 8. OTHER SYMPTOMS: "Do you have any other symptoms?" (e.g., fever, abdominal pain, vomiting,  difficulty passing urine)     Swelling  Protocols used: Scrotal Pain-A-AH

## 2022-12-06 NOTE — Telephone Encounter (Signed)
  Chief Complaint: Testicular pain Symptoms: NA Frequency: nA Pertinent Negatives: Patient denies na Disposition: [] ED /[] Urgent Care (no appt availability in office) / [] Appointment(In office/virtual)/ []  [facility]/ [] Home Care/ [] Refused Recommended Disposition /[] [facility]/ []  Follow-up with PCP Additional Notes: Please see earlier triage.  Pt calling back, angry affect, yelling. States he called EMS as directed, was told by EMS should see PCP first as he would be waiting a long time "And may get in quicker by having a doctors appt first." Advised no availability. Pt states he has transportation at 1:15 and is coming in to be seen. Advised to not do that as needs secured appt. Pt continues yelling "I'll just lay here and die I guess." Then states "I don' think I'll die, it's just a lot going on with me and I ned to be seen at 1:15."Called during practice lunch time. Assured pt NT would route to practice for PCPs review and final disposition. Asking what time would get CB, advised I could not speak to that, again states "I'll see you at 1:15." Team message sent to [place] as well.  CB# [phone] Reason for Disposition  Nursing judgment or information in reference  Answer Assessment - Initial Assessment Questions 1. REASON FOR CALL: "What is your main concern right now?"     Coming in to be seen at 115 2. ONSET: "When did the *No Answer* start?"     *No Answer* 3. SEVERITY: "How bad is the *No Answer*?"     *No Answer* 4. FEVER: "Do you have a fever?"     *No Answer* 5. OTHER SYMPTOMS: "Do you have any other new symptoms?"     *No Answer* 6. TREATMENTS AND RESPONSE: "What have you done so far to try to make this better? What medicines have you used?"     *No Answer* 7. PREGNANCY: "Is there any chance you are pregnant?" "When was your last menstrual period?"     *No Answer*  Protocols used: No Guideline Available-A-AH

## 2022-12-06 NOTE — Telephone Encounter (Signed)
He needs to see urology

## 2022-12-06 NOTE — Telephone Encounter (Signed)
Needs to see urology ASAP

## 2022-12-07 ENCOUNTER — Inpatient Hospital Stay: Payer: 59 | Admitting: Internal Medicine

## 2022-12-07 ENCOUNTER — Ambulatory Visit (INDEPENDENT_AMBULATORY_CARE_PROVIDER_SITE_OTHER): Payer: 59 | Admitting: Internal Medicine

## 2022-12-07 ENCOUNTER — Encounter: Payer: Self-pay | Admitting: Internal Medicine

## 2022-12-07 VITALS — BP 138/84 | HR 103 | Temp 96.8°F | Wt 164.0 lb

## 2022-12-07 DIAGNOSIS — N50811 Right testicular pain: Secondary | ICD-10-CM

## 2022-12-07 DIAGNOSIS — C689 Malignant neoplasm of urinary organ, unspecified: Secondary | ICD-10-CM | POA: Diagnosis not present

## 2022-12-07 DIAGNOSIS — N5089 Other specified disorders of the male genital organs: Secondary | ICD-10-CM | POA: Diagnosis not present

## 2022-12-07 LAB — POCT URINALYSIS DIPSTICK
Glucose, UA: NEGATIVE
Ketones, UA: NEGATIVE
Nitrite, UA: POSITIVE
Protein, UA: POSITIVE — AB
Spec Grav, UA: >=1.03 — AB (ref 1.010–1.025)
Urobilinogen, UA: 0.2 E.U./dL
pH, UA: 6 (ref 5.0–8.0)

## 2022-12-07 MED ORDER — SULFAMETHOXAZOLE-TRIMETHOPRIM 800-160 MG PO TABS: 1.0000 | ORAL_TABLET | Freq: Two times a day (BID) | ORAL | 0 refills | Status: DC

## 2022-12-07 NOTE — Progress Notes (Signed)
Subjective:    Patient ID: Joe Hardy, male    DOB: 04-May-1941, 82 y.o.   MRN: [id number]  HPI  Pt presents to the clinic today with c/o swelling in his testicles. He noticed this years ago. He reports the right testicle has always been larger than the left. He does report associated pain which he describes as soreness. He is not having any urinary issues at this time. He reports the blood in he urine has resolved.  He is having some slight abdominal pressure but attributes this to constipation.  He reports he took a laxative earlier today.  He was initially seen 2/13 with urinary retention and unable to void in 9 hours. He could not provide a urine sample in the office. He was sent to the ED for evaluation at that time. Urinalysis at that time was not concerning for infection. He was diagnosed with BPH with urinary retention. I believe a catheter was placed during this visit. It was felt this was secondary to constipation and he was prescribed Senna and discharged. He subsequently saw Dr. Diamantina Providence 2/20 for reevaluation. His catheter was removed at that time, but he came back later that afternoon, unable to void and so catheter was replaced. He was scheduled for a cystogram and cystoscopy. The cystogram showed:  IMPRESSION:  Enlarged prostate with mass effect along the base of the bladder. Foley catheter. There is severe bladder wall thickening with trabeculation and diverticula formation. In addition there are several luminal bladder stones. No proximal renal collecting system dilatation, filling defect or enhancing mass. No additional stones. Benign renal cysts. Solitary pathologically enlarged node along the anterior left hemipelvis.   Cystoscopy was performed on 3/6 and revealed enlarged prostate and multiple bladder stones.  Urine culture was sent and showed greater than 100 colonies of Serratia Marcescens, prescribed Bactrim.  His Foley catheter was replaced at this visit.  He was  scheduled for a HoLEP procedure which took place on 3/15, and subsequently discharged home.  He presented to the ER 3/15 was concerned that he was losing too much blood in his Foley catheter and that his Foley catheter was falling out.  His Foley catheter was replaced.  CBC did show a slightly worsened anemia.  Urinalysis was concerning for infection but he was already on Bactrim, they did not change his antibiotic.  He was discharged advised to follow-up with urology.  He presented back to the ER 3/15 with persistent concerned that he was losing too much blood in his Foley catheter and that his Foley catheter was falling out.  Urology was consulted.  He underwent continuous bladder irrigation and subsequently discharged advised to follow-up with urology.  Was reevaluated by Dr. Jeb Levering 3/20.  It seems as if the catheter was not replaced at the 3/15 office visit.  Reassurance was provided at this visit and he was referred to oncology for follow-up of the enlarged node along the anterior left hemipelvis.  The pathology revealed urothelial carcinoma.  He has an appointment with oncology 3/29.  Review of Systems     Past Medical History:  Diagnosis Date   Bladder stones    Depression    GERD (gastroesophageal reflux disease)    Hyperlipidemia    Hypertension    Iron deficiency anemia    Migraine    Sleep apnea     Current Outpatient Medications  Medication Sig Dispense Refill   Ascorbic Acid (VITAMIN C) 1000 MG tablet Take 1,000 mg by mouth daily.  atorvastatin (LIPITOR) 20 MG tablet TAKE 1 TABLET BY MOUTH  DAILY AT 6PM (Patient taking differently: Take 20 mg by mouth daily.) 90 tablet 1   b complex vitamins tablet Take 1 tablet by mouth daily.     baclofen (LIORESAL) 10 MG tablet Take 5 mg by mouth 2 (two) times daily.     Blood Pressure Monitoring (SM WRIST CUFF BP MONITOR) MISC 1 Units by Does not apply route 2 (two) times a week. 1 each 0   buPROPion (WELLBUTRIN XL) 150 MG 24 hr  tablet Take 450 mg by mouth daily.     busPIRone (BUSPAR) 10 MG tablet Take 1 tablet (10 mg total) by mouth 2 (two) times daily. 180 tablet 1   cholecalciferol (VITAMIN D3) 25 MCG (1000 UT) tablet Take 4,000 Units by mouth daily.      Cobalamin Combinations (NEURIVA PLUS PO) Take 1 capsule by mouth daily in the afternoon.     cyanocobalamin 1000 MCG tablet Take 1,000 mcg by mouth daily.     Flaxseed, Linseed, (FLAX SEEDS PO) Take by mouth.     GARLIC PO Take 2 tablets by mouth daily.     levothyroxine (SYNTHROID) 75 MCG tablet TAKE 1 TABLET BY MOUTH DAILY  BEFORE BREAKFAST 90 tablet 1   lisinopril-hydrochlorothiazide (ZESTORETIC) 10-12.5 MG tablet TAKE 1 TABLET BY MOUTH DAILY 90 tablet 1   magnesium 30 MG tablet Take 1 tablet (30 mg total) by mouth daily. 90 tablet 3   meloxicam (MOBIC) 15 MG tablet Take 15 mg by mouth daily.     mirtazapine (REMERON) 30 MG tablet Take 30 mg by mouth at bedtime.     Multiple Vitamin (MULTIVITAMIN WITH MINERALS) TABS tablet Take 1 tablet by mouth daily.     Saw Palmetto 450 MG CAPS Take 450 mg by mouth daily.     senna-docusate (SENOKOT-S) 8.6-50 MG tablet Take 1 tablet by mouth daily. 30 tablet 0   traMADol (ULTRAM) 50 MG tablet Take 1 tablet (50 mg total) by mouth every 6 (six) hours as needed. 10 tablet 0   traZODone (DESYREL) 150 MG tablet Take 150 mg by mouth at bedtime.     TURMERIC PO Take 500 mg by mouth 2 (two) times daily.     No current facility-administered medications for this visit.    Allergies  Allergen Reactions   Fluoxetine     insomnia   Venlafaxine     difficulty sleeping    Family History  Problem Relation Age of Onset   Tuberculosis Mother    Asthma Mother    Emphysema Father    Heart attack Father     Social History   Socioeconomic History   Marital status: Divorced    Spouse name: Not on file   Number of children: Not on file   Years of education: Not on file   Highest education level: Bachelor's degree (e.g., BA,  AB, BS)  Occupational History   Occupation: retired  Tobacco Use   Smoking status: Former    Packs/day: 1.50    Years: 20.00    Additional pack years: 0.00    Total pack years: 30.00    Types: Cigarettes    Quit date: 05/02/2017    Years since quitting: 5.6   Smokeless tobacco: Former   Tobacco comments:    Starts and stops related to anxiety rather than drinking alcohol  Vaping Use   Vaping Use: Never used  Substance and Sexual Activity   Alcohol use: No  Alcohol/week: 0.0 standard drinks of alcohol    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)   Drug use: Not Currently    Types: Marijuana   Sexual activity: Not Currently  Other Topics Concern   Not on file  Social History Narrative   Not on file   Social Determinants of Health   Financial Resource Strain: Low Risk  (10/09/2021)   Overall Financial Resource Strain (CARDIA)    Difficulty of Paying Living Expenses: Not hard at all  Food Insecurity: No Food Insecurity (12/02/2022)   Hunger Vital Sign    Worried About Running Out of Food in the Last Year: Never true    Ran Out of Food in the Last Year: Never true  Transportation Needs: No Transportation Needs (12/02/2022)   PRAPARE - Hydrologist (Medical): No    Lack of Transportation (Non-Medical): No  Physical Activity: Inactive (10/09/2021)   Exercise Vital Sign    Days of Exercise per Week: 0 days    Minutes of Exercise per Session: 0 min  Stress: No Stress Concern Present (10/09/2021)   [place]    Feeling of Stress : Not at all  Social Connections: Moderately Isolated (10/09/2021)   Social Connection and Isolation Panel [NHANES]    Frequency of Communication with Friends and Family: More than three times a week    Frequency of Social Gatherings with Friends and Family: More than three times a week    Attends Religious Services: Never    Marine scientist or  Organizations: Yes    Attends Archivist Meetings: Never    Marital Status: Divorced  Human resources officer Violence: Not At Risk (10/09/2021)   Humiliation, Afraid, Rape, and Kick questionnaire    Fear of Current or Ex-Partner: No    Emotionally Abused: No    Physically Abused: No    Sexually Abused: No     Constitutional: Patient reports fatigue and unintentional weight loss.  Denies fever, malaise,  headache.  Respiratory: Denies difficulty breathing, shortness of breath, cough or sputum production.   Cardiovascular: Denies chest pain, chest tightness, palpitations or swelling in the hands or feet.  Gastrointestinal: Patient reports abdominal pressure, constipation.  Denies abdominal pain, bloating, diarrhea or blood in the stool.  GU: Patient reports testicular pain and swelling.  Denies urgency, frequency, pain with urination, burning sensation, blood in urine, odor or discharge.  No other specific complaints in a complete review of systems (except as listed in HPI above).  Objective:   Physical Exam   BP 138/84 (BP Location: Left Arm, Patient Position: Sitting, Cuff Size: Normal)   Pulse (!) 103   Temp (!) 96.8 F (36 C) (Temporal)   Wt 164 lb (74.4 kg)   SpO2 99%   BMI 24.22 kg/m   Wt Readings from Last 3 Encounters:  11/27/22 155 lb (70.3 kg)  11/27/22 159 lb 13.3 oz (72.5 kg)  11/26/22 159 lb 13.3 oz (72.5 kg)    General: Appears his stated age, in NAD. Cardiovascular: Tachycardic with normal rhythm. S1,S2 noted.  No murmur, rubs or gallops noted.  Pulmonary/Chest: Normal effort and positive vesicular breath sounds. No respiratory distress. No wheezes, rales or ronchi noted.  Abdomen: Soft and nontender.  Hypoactive bowel sounds. No distention or masses noted. GU: Normal male anatomy.  The right testicle is significantly bigger than the left testicle.  He has no pain with palpation of the left testicle.  He has pain with very light palpation starting at the  spermatic cord.  I am unable to identify the epididymis because he is so significantly tender.  The right scrotum is reddened. Musculoskeletal: Gait slow and steady without device. Neurological: Alert and oriented.  He seems to be having some trouble with recall.    BMET    Component Value Date/Time   NA 140 09/23/2022 1012   NA 140 04/20/2017 0925   K 3.9 09/23/2022 1012   CL 105 09/23/2022 1012   CO2 26 09/23/2022 1012   GLUCOSE 96 09/23/2022 1012   BUN 17 09/23/2022 1012   BUN 9 04/20/2017 0925   CREATININE 1.04 09/23/2022 1012   CALCIUM 9.9 09/23/2022 1012   GFRNONAA 85 03/09/2021 1119   GFRAA 98 03/09/2021 1119    Lipid Panel     Component Value Date/Time   CHOL 196 09/23/2022 1012   CHOL 113 04/20/2017 0925   TRIG 56 09/23/2022 1012   HDL 53 09/23/2022 1012   HDL 26 (L) 04/20/2017 0925   CHOLHDL 3.7 09/23/2022 1012   VLDL 24 08/17/2016 0001   LDLCALC 128 (H) 09/23/2022 1012    CBC    Component Value Date/Time   WBC 11.1 (H) 11/27/2022 1128   RBC 5.09 11/27/2022 1128   HGB 11.1 (L) 11/27/2022 1128   HGB 13.0 04/22/2015 0818   HCT 34.3 (L) 11/27/2022 1128   HCT 38.5 04/22/2015 0818   PLT 292 11/27/2022 1128   PLT 257 04/22/2015 0818   MCV 67.4 (L) 11/27/2022 1128   MCV 69 (L) 04/22/2015 0818   MCH 21.8 (L) 11/27/2022 1128   MCHC 32.4 11/27/2022 1128   RDW 16.2 (H) 11/27/2022 1128   RDW 16.0 (H) 04/22/2015 0818   LYMPHSABS 2.0 11/27/2022 1128   LYMPHSABS 2.4 04/22/2015 0818   MONOABS 1.0 11/27/2022 1128   EOSABS 0.1 11/27/2022 1128   EOSABS 0.2 04/22/2015 0818   BASOSABS 0.0 11/27/2022 1128   BASOSABS 0.0 04/22/2015 0818    Hgb A1C Lab Results  Component Value Date   HGBA1C 5.4 09/23/2022           Assessment & Plan:   Right Testicular Pain and Swelling, Urothelial Carcinoma:  Multiple ER notes, procedure and urology office visits reviewed with patient Urinalysis: Large blood, positive nitrites, large leuks, 100+ protein Will send  urine culture Rx for Bactrim DS 1 tab twice daily x 14 days per Dr. Doristine Counter recommendation Discussed obtaining an ultrasound of the scrotum for further evaluation of his symptoms however he does not think he has transportation available to do this today Advised him to follow-up with oncology on Friday and see if they could possibly do an ultrasound at that visit    RTC in 3 months for follow-up of chronic conditions Webb Silversmith, NP

## 2022-12-07 NOTE — Patient Instructions (Signed)
Epididymitis  Epididymitis is inflammation or swelling of the epididymis. This is caused by an infection. The epididymis is a cord-like structure that is located along the top and back part of the testicle. It collects and stores sperm from the testicle. This condition can also cause pain and swelling of the testicle and scrotum. Symptoms usually start suddenly (acute epididymitis). Sometimes epididymitis starts gradually and lasts for a while (chronic epididymitis). Chronic epididymitis may be harder to treat. What are the causes? In men ages 20-40, this condition is usually caused by a bacterial infection or a sexually transmitted infection (STI), such as gonorrhea or chlamydia. In men 40 and older, this condition is usually caused by bacteria from a urinary blockage or from abnormalities in the urinary system. These can result from: Having a tube placed into the bladder (urinary catheter). Having an enlarged or inflamed prostate gland. Having recently had urinary tract surgery. Having a problem with a backward flow of urine (retrograde). In men who have a condition that weakens the body's defense system (immune system), such as human immunodeficiency virus (HIV), this condition can be caused by: Other bacteria, including tuberculosis and syphilis. Viruses. Fungi. Sometimes this condition occurs without infection. This may happen because of trauma or repetitive activities such as sports. What increases the risk? You are more likely to develop this condition if you have: Unprotected sex with more than one partner. Anal sex. Had recent surgery. A urinary catheter. Urinary problems. A suppressed immune system. What are the signs or symptoms? This condition usually begins suddenly with chills, fever, and pain behind the scrotum and in the testicle. Other symptoms include: Swelling of the scrotum, testicle, or both. Pain when ejaculating or urinating. Pain in the back or  abdomen. Nausea. Itching and discharge from the penis. A frequent need to pass urine. Redness, increased warmth, and tenderness of the scrotum. How is this diagnosed? Your health care provider can diagnose this condition based on your symptoms and medical history. Your health care provider will also do a physical exam to check your scrotum and testicle for swelling, pain, and redness. You may also have other tests, including: Testing of discharge from the penis. Testing your urine for infections, such as STIs. Ultrasound to check for blood flow and inflammation. Your health care provider may test you for other STIs, including HIV. How is this treated? Treatment for this condition depends on the cause. If your condition is caused by a bacterial infection, oral antibiotic medicine may be prescribed. If the bacterial infection has spread to your blood, you may need to receive IV antibiotics. For both bacterial and nonbacterial epididymitis, you may be treated with: Rest. Elevation of the scrotum. Pain medicines. Anti-inflammatory medicines. Surgery may be needed if: You have pus buildup in the scrotum (abscess). You have epididymitis that has not responded to other treatments. Follow these instructions at home: Medicines Take over-the-counter and prescription medicines only as told by your health care provider. If you were prescribed an antibiotic medicine, take it as told by your health care provider. Do not stop taking the antibiotic even if your condition improves. Sexual activity If your epididymitis was caused by an STI, avoid sexual activity until your treatment is complete. Inform your sexual partner or partners if you test positive for an STI. They may need to be treated. Do not engage in sexual activity with your partner or partners until their treatment is completed. Managing pain and swelling  If directed, raise (elevate) your scrotum and apply ice.   To do this: Put ice in a  plastic bag. Place a small towel or pillow between your legs. Rest your scrotum on the pillow or towel. Place another towel between your skin and the plastic bag. Leave the ice on for 20 minutes, 2-3 times a day. Remove the ice if your skin turns bright red. This is very important. If you cannot feel pain, heat, or cold, you have a greater risk of damage to the area. Keep your scrotum elevated and supported while resting. Ask your health care provider if you should wear a scrotal support, such as a jockstrap. Wear it as told by your health care provider. Try taking a sitz bath to help with discomfort. This is a warm water bath that is taken while you are sitting down. The water should come up to your hips and should cover your buttocks. Do this 3-4 times per day or as told by your health care provider. General instructions Drink enough fluid to keep your urine pale yellow. Return to your normal activities as told by your health care provider. Ask your health care provider what activities are safe for you. Keep all follow-up visits. This is important. Contact a health care provider if: You have a fever. Your pain medicine is not helping. Your pain is getting worse. Your symptoms do not improve within 3 days. Summary Epididymitis is inflammation or swelling of the epididymis. This is caused by an infection. This condition can also cause pain and swelling of the testicle and scrotum. Treatment for this condition depends on the cause. If your condition is caused by a bacterial infection, oral antibiotic medicine may be prescribed. Inform your sexual partner or partners if you test positive for an STI. They may need to be treated. Do not engage in sexual activity with your partner or partners until their treatment is completed. Contact a health care provider if your symptoms do not improve within 3 days. This information is not intended to replace advice given to you by your health care provider.  Make sure you discuss any questions you have with your health care provider. Document Revised: 04/08/2021 Document Reviewed: 04/08/2021 Elsevier Patient Education  2023 [employer]  

## 2022-12-09 DIAGNOSIS — Z743 Need for continuous supervision: Secondary | ICD-10-CM | POA: Diagnosis not present

## 2022-12-09 DIAGNOSIS — R6889 Other general symptoms and signs: Secondary | ICD-10-CM | POA: Diagnosis not present

## 2022-12-09 DIAGNOSIS — I499 Cardiac arrhythmia, unspecified: Secondary | ICD-10-CM | POA: Diagnosis not present

## 2022-12-10 ENCOUNTER — Inpatient Hospital Stay: Payer: 59 | Admitting: Internal Medicine

## 2022-12-10 ENCOUNTER — Other Ambulatory Visit: Payer: Self-pay

## 2022-12-10 ENCOUNTER — Emergency Department: Payer: 59

## 2022-12-10 ENCOUNTER — Inpatient Hospital Stay: Payer: 59

## 2022-12-10 ENCOUNTER — Inpatient Hospital Stay
Admission: EM | Admit: 2022-12-10 | Discharge: 2022-12-14 | DRG: 948 | Disposition: A | Discharge status: Skilled Nursing Facility | Payer: 59 | Attending: Hospitalist | Admitting: Hospitalist

## 2022-12-10 ENCOUNTER — Observation Stay: Payer: 59

## 2022-12-10 DIAGNOSIS — M5136 Other intervertebral disc degeneration, lumbar region: Secondary | ICD-10-CM | POA: Diagnosis present

## 2022-12-10 DIAGNOSIS — C7919 Secondary malignant neoplasm of other urinary organs: Secondary | ICD-10-CM | POA: Diagnosis not present

## 2022-12-10 DIAGNOSIS — Z79890 Hormone replacement therapy (postmenopausal): Secondary | ICD-10-CM | POA: Diagnosis not present

## 2022-12-10 DIAGNOSIS — G8929 Other chronic pain: Secondary | ICD-10-CM | POA: Diagnosis not present

## 2022-12-10 DIAGNOSIS — D509 Iron deficiency anemia, unspecified: Secondary | ICD-10-CM | POA: Diagnosis not present

## 2022-12-10 DIAGNOSIS — R262 Difficulty in walking, not elsewhere classified: Secondary | ICD-10-CM | POA: Diagnosis not present

## 2022-12-10 DIAGNOSIS — N451 Epididymitis: Secondary | ICD-10-CM | POA: Diagnosis not present

## 2022-12-10 DIAGNOSIS — Z87891 Personal history of nicotine dependence: Secondary | ICD-10-CM | POA: Diagnosis not present

## 2022-12-10 DIAGNOSIS — R531 Weakness: Principal | ICD-10-CM | POA: Diagnosis present

## 2022-12-10 DIAGNOSIS — N138 Other obstructive and reflux uropathy: Secondary | ICD-10-CM | POA: Diagnosis present

## 2022-12-10 DIAGNOSIS — M48061 Spinal stenosis, lumbar region without neurogenic claudication: Secondary | ICD-10-CM | POA: Diagnosis present

## 2022-12-10 DIAGNOSIS — Z8249 Family history of ischemic heart disease and other diseases of the circulatory system: Secondary | ICD-10-CM | POA: Diagnosis not present

## 2022-12-10 DIAGNOSIS — R9389 Abnormal findings on diagnostic imaging of other specified body structures: Secondary | ICD-10-CM

## 2022-12-10 DIAGNOSIS — R29818 Other symptoms and signs involving the nervous system: Secondary | ICD-10-CM | POA: Diagnosis not present

## 2022-12-10 DIAGNOSIS — R682 Dry mouth, unspecified: Secondary | ICD-10-CM | POA: Diagnosis not present

## 2022-12-10 DIAGNOSIS — N401 Enlarged prostate with lower urinary tract symptoms: Secondary | ICD-10-CM | POA: Diagnosis present

## 2022-12-10 DIAGNOSIS — R279 Unspecified lack of coordination: Secondary | ICD-10-CM | POA: Diagnosis not present

## 2022-12-10 DIAGNOSIS — Z888 Allergy status to other drugs, medicaments and biological substances status: Secondary | ICD-10-CM | POA: Diagnosis not present

## 2022-12-10 DIAGNOSIS — R270 Ataxia, unspecified: Secondary | ICD-10-CM | POA: Diagnosis not present

## 2022-12-10 DIAGNOSIS — K219 Gastro-esophageal reflux disease without esophagitis: Secondary | ICD-10-CM | POA: Diagnosis present

## 2022-12-10 DIAGNOSIS — D508 Other iron deficiency anemias: Secondary | ICD-10-CM | POA: Diagnosis not present

## 2022-12-10 DIAGNOSIS — Z79899 Other long term (current) drug therapy: Secondary | ICD-10-CM

## 2022-12-10 DIAGNOSIS — M1990 Unspecified osteoarthritis, unspecified site: Secondary | ICD-10-CM | POA: Diagnosis present

## 2022-12-10 DIAGNOSIS — E039 Hypothyroidism, unspecified: Secondary | ICD-10-CM | POA: Diagnosis not present

## 2022-12-10 DIAGNOSIS — N210 Calculus in bladder: Secondary | ICD-10-CM | POA: Diagnosis not present

## 2022-12-10 DIAGNOSIS — R29898 Other symptoms and signs involving the musculoskeletal system: Secondary | ICD-10-CM | POA: Diagnosis present

## 2022-12-10 DIAGNOSIS — C689 Malignant neoplasm of urinary organ, unspecified: Secondary | ICD-10-CM | POA: Diagnosis not present

## 2022-12-10 DIAGNOSIS — N50811 Right testicular pain: Secondary | ICD-10-CM | POA: Insufficient documentation

## 2022-12-10 DIAGNOSIS — F0394 Unspecified dementia, unspecified severity, with anxiety: Secondary | ICD-10-CM | POA: Diagnosis not present

## 2022-12-10 DIAGNOSIS — K117 Disturbances of salivary secretion: Secondary | ICD-10-CM | POA: Diagnosis present

## 2022-12-10 DIAGNOSIS — F32A Depression, unspecified: Secondary | ICD-10-CM | POA: Diagnosis not present

## 2022-12-10 DIAGNOSIS — E782 Mixed hyperlipidemia: Secondary | ICD-10-CM | POA: Diagnosis present

## 2022-12-10 DIAGNOSIS — C61 Malignant neoplasm of prostate: Secondary | ICD-10-CM | POA: Diagnosis present

## 2022-12-10 DIAGNOSIS — M4800 Spinal stenosis, site unspecified: Secondary | ICD-10-CM | POA: Diagnosis present

## 2022-12-10 DIAGNOSIS — K5900 Constipation, unspecified: Secondary | ICD-10-CM | POA: Diagnosis present

## 2022-12-10 DIAGNOSIS — Z825 Family history of asthma and other chronic lower respiratory diseases: Secondary | ICD-10-CM

## 2022-12-10 DIAGNOSIS — M6259 Muscle wasting and atrophy, not elsewhere classified, multiple sites: Secondary | ICD-10-CM | POA: Diagnosis not present

## 2022-12-10 DIAGNOSIS — R2681 Unsteadiness on feet: Secondary | ICD-10-CM | POA: Diagnosis not present

## 2022-12-10 DIAGNOSIS — I10 Essential (primary) hypertension: Secondary | ICD-10-CM | POA: Diagnosis present

## 2022-12-10 LAB — CBC WITH DIFFERENTIAL/PLATELET
Abs Immature Granulocytes: 0.05 10*3/uL (ref 0.00–0.07)
Basophils Absolute: 0.1 10*3/uL (ref 0.0–0.1)
Basophils Relative: 1 %
Eosinophils Absolute: 0.1 10*3/uL (ref 0.0–0.5)
Eosinophils Relative: 2 %
HCT: 30.9 % — ABNORMAL LOW (ref 39.0–52.0)
Hemoglobin: 9.9 g/dL — ABNORMAL LOW (ref 13.0–17.0)
Immature Granulocytes: 1 %
Lymphocytes Relative: 27 %
Lymphs Abs: 1.9 10*3/uL (ref 0.7–4.0)
MCH: 21.6 pg — ABNORMAL LOW (ref 26.0–34.0)
MCHC: 32 g/dL (ref 30.0–36.0)
MCV: 67.3 fL — ABNORMAL LOW (ref 80.0–100.0)
Monocytes Absolute: 0.7 10*3/uL (ref 0.1–1.0)
Monocytes Relative: 10 %
Neutro Abs: 4.4 10*3/uL (ref 1.7–7.7)
Neutrophils Relative %: 59 %
Platelets: 265 10*3/uL (ref 150–400)
RBC: 4.59 MIL/uL (ref 4.22–5.81)
RDW: 16.2 % — ABNORMAL HIGH (ref 11.5–15.5)
Smear Review: NORMAL
WBC: 7.2 10*3/uL (ref 4.0–10.5)
nRBC: 0 % (ref 0.0–0.2)

## 2022-12-10 LAB — BASIC METABOLIC PANEL
Anion gap: 8 (ref 5–15)
BUN: 15 mg/dL (ref 8–23)
CO2: 22 mmol/L (ref 22–32)
Calcium: 9.7 mg/dL (ref 8.9–10.3)
Chloride: 103 mmol/L (ref 98–111)
Creatinine, Ser: 1.06 mg/dL (ref 0.61–1.24)
GFR, Estimated: >60 mL/min (ref >60)
Glucose, Bld: 101 mg/dL — ABNORMAL HIGH (ref 70–99)
Potassium: 4.1 mmol/L (ref 3.5–5.1)
Sodium: 133 mmol/L — ABNORMAL LOW (ref 135–145)

## 2022-12-10 LAB — TSH: TSH: 2.789 u[IU]/mL (ref 0.350–4.500)

## 2022-12-10 LAB — CK: Total CK: 44 U/L — ABNORMAL LOW (ref 49–397)

## 2022-12-10 LAB — URINE CULTURE
MICRO NUMBER:: 14748384
SPECIMEN QUALITY:: ADEQUATE

## 2022-12-10 LAB — HEPATIC FUNCTION PANEL
ALT: 11 U/L (ref 0–44)
AST: 17 U/L (ref 15–41)
Albumin: 3.3 g/dL — ABNORMAL LOW (ref 3.5–5.0)
Alkaline Phosphatase: 42 U/L (ref 38–126)
Bilirubin, Direct: <0.1 mg/dL (ref 0.0–0.2)
Total Bilirubin: 0.7 mg/dL (ref 0.3–1.2)
Total Protein: 6.4 g/dL — ABNORMAL LOW (ref 6.5–8.1)

## 2022-12-10 MED ORDER — ACETAMINOPHEN 325 MG PO TABS: 650.0000 mg | ORAL_TABLET | Freq: Four times a day (QID) | ORAL | Status: DC | PRN

## 2022-12-10 MED ORDER — ONDANSETRON HCL 4 MG PO TABS: 4.0000 mg | ORAL_TABLET | Freq: Four times a day (QID) | ORAL | Status: DC | PRN

## 2022-12-10 MED ORDER — ENOXAPARIN SODIUM 40 MG/0.4ML IJ SOSY
40.0000 mg | PREFILLED_SYRINGE | INTRAMUSCULAR | Status: DC
  Administered 2022-12-11 – 2022-12-14 (×4): 40 mg via SUBCUTANEOUS
  Filled 2022-12-10 (×5): qty 0.4

## 2022-12-10 MED ORDER — ONDANSETRON HCL 4 MG/2ML IJ SOLN: 4.0000 mg | Freq: Four times a day (QID) | INTRAMUSCULAR | Status: DC | PRN

## 2022-12-10 MED ORDER — HYDROCHLOROTHIAZIDE 12.5 MG PO TABS
12.5000 mg | ORAL_TABLET | Freq: Every day | ORAL | Status: DC
  Filled 2022-12-10 (×2): qty 1

## 2022-12-10 MED ORDER — BUPROPION HCL ER (XL) 150 MG PO TB24
450.0000 mg | ORAL_TABLET | Freq: Every day | ORAL | Status: DC
  Administered 2022-12-10 – 2022-12-13 (×4): 450 mg via ORAL
  Administered 2022-12-14: 300 mg via ORAL
  Filled 2022-12-10 (×7): qty 3

## 2022-12-10 MED ORDER — LISINOPRIL 10 MG PO TABS
10.0000 mg | ORAL_TABLET | Freq: Every day | ORAL | Status: DC
  Administered 2022-12-10: 10 mg via ORAL
  Filled 2022-12-10 (×3): qty 1

## 2022-12-10 MED ORDER — SULFAMETHOXAZOLE-TRIMETHOPRIM 800-160 MG PO TABS
1.0000 | ORAL_TABLET | Freq: Two times a day (BID) | ORAL | Status: DC
  Filled 2022-12-10 (×2): qty 1

## 2022-12-10 MED ORDER — MIRTAZAPINE 15 MG PO TABS
30.0000 mg | ORAL_TABLET | Freq: Once | ORAL | Status: AC
  Administered 2022-12-10: 30 mg via ORAL
  Filled 2022-12-10: qty 2

## 2022-12-10 MED ORDER — TRAZODONE HCL 50 MG PO TABS
150.0000 mg | ORAL_TABLET | Freq: Once | ORAL | Status: AC
  Administered 2022-12-10: 150 mg via ORAL
  Filled 2022-12-10: qty 1

## 2022-12-10 MED ORDER — ATORVASTATIN CALCIUM 20 MG PO TABS: 20.0000 mg | ORAL_TABLET | Freq: Every day | ORAL | Status: DC

## 2022-12-10 MED ORDER — SULFAMETHOXAZOLE-TRIMETHOPRIM 800-160 MG PO TABS
1.0000 | ORAL_TABLET | Freq: Two times a day (BID) | ORAL | Status: DC
  Administered 2022-12-10 – 2022-12-14 (×8): 1 via ORAL
  Filled 2022-12-10 (×8): qty 1

## 2022-12-10 MED ORDER — LEVOTHYROXINE SODIUM 50 MCG PO TABS
75.0000 ug | ORAL_TABLET | Freq: Every day | ORAL | Status: DC
  Administered 2022-12-10 – 2022-12-14 (×5): 75 ug via ORAL
  Filled 2022-12-10 (×5): qty 2

## 2022-12-10 MED ORDER — GADOBUTROL 1 MMOL/ML IV SOLN
6.0000 mL | Freq: Once | INTRAVENOUS | Status: AC | PRN
  Administered 2022-12-10: 6 mL via INTRAVENOUS

## 2022-12-10 MED ORDER — LISINOPRIL-HYDROCHLOROTHIAZIDE 10-12.5 MG PO TABS: 1.0000 | ORAL_TABLET | Freq: Every day | ORAL | Status: DC

## 2022-12-10 MED ORDER — DOCUSATE SODIUM 100 MG PO CAPS
200.0000 mg | ORAL_CAPSULE | Freq: Two times a day (BID) | ORAL | Status: DC | PRN
  Administered 2022-12-10 – 2022-12-11 (×2): 200 mg via ORAL
  Filled 2022-12-10 (×2): qty 2

## 2022-12-10 MED ORDER — BUSPIRONE HCL 10 MG PO TABS
10.0000 mg | ORAL_TABLET | Freq: Two times a day (BID) | ORAL | Status: DC
  Administered 2022-12-10 – 2022-12-14 (×9): 10 mg via ORAL
  Filled 2022-12-10 (×2): qty 1
  Filled 2022-12-10: qty 2
  Filled 2022-12-10 (×7): qty 1

## 2022-12-10 MED ORDER — LORAZEPAM 0.5 MG PO TABS
0.5000 mg | ORAL_TABLET | Freq: Once | ORAL | Status: AC | PRN
  Administered 2022-12-10: 0.5 mg via ORAL
  Filled 2022-12-10: qty 1

## 2022-12-10 MED ORDER — LEVOFLOXACIN IN D5W 750 MG/150ML IV SOLN
750.0000 mg | INTRAVENOUS | Status: DC
  Filled 2022-12-10: qty 150

## 2022-12-10 NOTE — ED Notes (Signed)
ED Provider at bedside. 

## 2022-12-10 NOTE — Assessment & Plan Note (Signed)
Holding statin in setting of LE weakness

## 2022-12-10 NOTE — Progress Notes (Signed)
To MRI/ultrasound

## 2022-12-10 NOTE — Progress Notes (Signed)
Pt asking about his HS meds for sleep that he takes at home.  Reviewed med rec which is complete.  Pt does take Trazodone 150mg  qhs and Mirtazapine 30mg  qhs at home.  Discussed with Sharion Settler NP and will give one time dose tonight and have attending MD follow up with further orders in AM. Ayesha Mohair BSN RN [facility] 12/10/2022, 10:26 PM

## 2022-12-10 NOTE — Assessment & Plan Note (Addendum)
Patient with worsening lower extremity weakness to the point of relative ataxia since limited URI early in February. 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  Metastatic spread Will also check metabolic etiologies including B12 and CK levels Check TSH Will plan for imaging including MRI of the brain, MRI with and without contrast of the CT and L-spine to better assess Case also discussed w/ neurology  Follow up formal neurology recommendations Follow closely

## 2022-12-10 NOTE — Progress Notes (Signed)
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  Case discussed w/ on call NSG Dr. Dava Najjar  Likely chronic  Will formally consult in am.

## 2022-12-10 NOTE — H&P (Addendum)
History and Physical    Patient: Joe Hardy [id number] DOB: 06-Jan-1941 DOA: 12/10/2022 DOS: the patient was seen and examined on 12/10/2022 PCP: Jearld Fenton, NP  Patient coming from: Home  Chief Complaint:  Chief Complaint  Patient presents with   dry mouth    HPI: Joe Hardy is a 82 y.o. male with medical history significant of hyperlipidemia, GERD, hypertension, iron deficiency anemia, bladder stones presenting with lower extremity weakness.  Patient reports progressive worsening lower extremity weakness over the past 4 to 6 weeks.  Patient reports having a viral URI early in February that lasted for about a week.  Patient reports significant lower extremity weakness since this point.  Noted baseline history of degenerative disc disease in the lumbar spine.  Has been relatively stable.  Patient has been able to ambulate on his own for the most part prior to this.  Now with worsening difficulty with ambulation to the point where patient reports having to sit down when opening the fridge for food.  No hemiparesis or confusion.  Denies any recent medication changes.  No reported numbness.  No bladder incontinence though with patient noted recent evaluation for urinary retention, hematuria with urology.  Workup included cystoscopy and lithotripsy of bladder stones.  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  Some concern for metastatic disease.  Last bowel meant was approximately 3 days ago.  No reported alcohol or tobacco use actively.  Does report remote history of using both in the past. Presented to the ER afebrile, hemodynamically stable.White count 7.2, hemoglobin 9.9, creatinine 1.06.  CT head grossly within normal limits. Review of Systems: As mentioned in the history of present illness. All other systems reviewed and are negative. Past Medical History:  Diagnosis Date   Bladder stones    Depression    GERD  (gastroesophageal reflux disease)    Hyperlipidemia    Hypertension    Iron deficiency anemia    Migraine    Sleep apnea    Past Surgical History:  Procedure Laterality Date   COLONOSCOPY     CYSTOSCOPY WITH LITHOLAPAXY N/A 11/26/2022   Procedure: CYSTOSCOPY WITH LITHOLAPAXY;  Surgeon: Billey Co, MD;  Location: [facility] ORS;  Service: Urology;  Laterality: N/A;   HOLEP-LASER ENUCLEATION OF THE PROSTATE WITH MORCELLATION N/A 11/26/2022   Procedure: HOLEP-LASER ENUCLEATION OF THE PROSTATE WITH MORCELLATION;  Surgeon: Billey Co, MD;  Location: [facility] OR[facility];  Service: Urology;  Laterality: N/A;   oral surgery     TONSILLECTOMY     Social History:  reports that he quit smoking about 5 years ago. His smoking use included cigarettes. He has a 30.00 pack-year smoking history. He has quit using smokeless tobacco. He reports that he does not currently use drugs after having used the following drugs: Marijuana. He reports that he does not drink alcohol.  Allergies  Allergen Reactions   Fluoxetine     insomnia   Venlafaxine     difficulty sleeping    Family History  Problem Relation Age of Onset   Tuberculosis Mother    Asthma Mother    Emphysema Father    Heart attack Father     Prior to Admission medications   Medication Sig Start Date End Date Taking? Authorizing Provider  Ascorbic Acid (VITAMIN C) 1000 MG tablet Take 1,000 mg by mouth daily.   Yes [provider]  atorvastatin (LIPITOR) 20 MG tablet TAKE 1 TABLET BY MOUTH  DAILY AT  6PM Patient taking differently: Take 20 mg by mouth daily. 07/20/22  Yes Jearld Fenton, NP  b complex vitamins tablet Take 1 tablet by mouth daily.   Yes [provider]  baclofen (LIORESAL) 10 MG tablet Take 5 mg by mouth 2 (two) times daily.   Yes [provider]  buPROPion (WELLBUTRIN XL) 150 MG 24 hr tablet Take 450 mg by mouth daily. 04/10/15  Yes [provider]  busPIRone (BUSPAR) 10 MG tablet Take 1 tablet  (10 mg total) by mouth 2 (two) times daily. 04/06/22  Yes Jearld Fenton, NP  cholecalciferol (VITAMIN D3) 25 MCG (1000 UT) tablet Take 4,000 Units by mouth daily.    Yes [provider]  cyanocobalamin 1000 MCG tablet Take 1,000 mcg by mouth daily.   Yes [provider]  levothyroxine (SYNTHROID) 75 MCG tablet TAKE 1 TABLET BY MOUTH DAILY  BEFORE BREAKFAST 08/30/22  Yes Baity, Coralie Keens, NP  lisinopril-hydrochlorothiazide (ZESTORETIC) 10-12.5 MG tablet TAKE 1 TABLET BY MOUTH DAILY 10/25/22  Yes Jearld Fenton, NP  mirtazapine (REMERON) 30 MG tablet Take 30 mg by mouth at bedtime. 10/02/15  Yes [provider]  Saw Palmetto 450 MG CAPS Take 450 mg by mouth daily.   Yes [provider]  senna-docusate (SENOKOT-S) 8.6-50 MG tablet Take 1 tablet by mouth daily. 10/26/22  Yes Cuthriell, Charline Bills, PA-C  sulfamethoxazole-trimethoprim (BACTRIM DS) 800-160 MG tablet Take 1 tablet by mouth 2 (two) times daily for 14 days. 12/07/22 12/21/22 Yes Baity, Coralie Keens, NP  Blood Pressure Monitoring (SM WRIST CUFF BP MONITOR) MISC 1 Units by Does not apply route 2 (two) times a week. 02/14/20   Malfi, Lupita Raider, FNP  Cobalamin Combinations (NEURIVA PLUS PO) Take 1 capsule by mouth daily in the afternoon.    [provider]  Flaxseed, Linseed, (FLAX SEEDS PO) Take by mouth. Patient not taking: Reported on 12/10/2022    [provider]  GARLIC PO Take 2 tablets by mouth daily.    [provider]  magnesium 30 MG tablet Take 1 tablet (30 mg total) by mouth daily. 11/26/22   Billey Co, MD  Multiple Vitamin (MULTIVITAMIN WITH MINERALS) TABS tablet Take 1 tablet by mouth daily. Patient not taking: Reported on 12/10/2022    [provider]  traMADol (ULTRAM) 50 MG tablet Take 1 tablet (50 mg total) by mouth every 6 (six) hours as needed. Patient not taking: Reported on 12/10/2022 10/28/22   Harvest Dark, MD  traZODone (DESYREL) 150 MG tablet Take 150  mg by mouth at bedtime. 04/10/15   [provider]  TURMERIC PO Take 500 mg by mouth 2 (two) times daily. Patient not taking: Reported on 12/10/2022    [provider]    Physical Exam: Vitals:   12/10/22 0434 12/10/22 0500 12/10/22 0530 12/10/22 0600  BP:  (!) 143/83 121/81 107/68  Pulse:  (!) 102 92 76  Resp:  14 14 13   Temp: 98.3 F (36.8 C)     TempSrc: Oral     SpO2:  100% 96% 96%  Weight:      Height:       Physical Exam Constitutional:      General: He is not in acute distress.    Appearance: He is normal weight.  HENT:     Head: Normocephalic and atraumatic.     Mouth/Throat:     Mouth: Mucous membranes are moist.  Eyes:     Pupils: Pupils are  equal, round, and reactive to light.  Cardiovascular:     Rate and Rhythm: Normal rate.  Pulmonary:     Effort: Pulmonary effort is normal.  Abdominal:     General: Bowel sounds are normal.  Musculoskeletal:     Cervical back: Normal range of motion.     Comments: Positive lower extremity weakness with inability to ambulate 3 out of 5 strength bilaterally while lying down  Neurological:     Comments: Positive bilateral lower extremity weakness with 3 out of 5 strength bilaterally lying down Positive generalized tremors predominantly in lower extremities when attempting to ambulate  Psychiatric:        Mood and Affect: Mood normal.     Data Reviewed:  There are no new results to review at this time. CT Head Wo Contrast CLINICAL DATA:  Neuro deficit with stroke suspected  EXAM: CT HEAD WITHOUT CONTRAST  TECHNIQUE: Contiguous axial images were obtained from the base of the skull through the vertex without intravenous contrast.  RADIATION DOSE REDUCTION: This exam was performed according to the departmental dose-optimization program which includes automated exposure control, adjustment of the mA and/or kV according to patient size and/or use of iterative reconstruction technique.  COMPARISON:   05/16/2015  FINDINGS: Brain: No evidence of acute infarction, hemorrhage, hydrocephalus, extra-axial collection or mass lesion/mass effect. Generalized cerebral volume loss since prior.  Vascular: No hyperdense vessel or unexpected calcification.  Skull: Normal. Negative for fracture or focal lesion.  Sinuses/Orbits: No acute finding.  IMPRESSION: Aging brain without acute or reversible finding  Electronically Signed   By: Jorje Guild M.D.   On: 12/10/2022 04:12  Lab Results  Component Value Date   WBC 7.2 12/10/2022   HGB 9.9 (L) 12/10/2022   HCT 30.9 (L) 12/10/2022   MCV 67.3 (L) 12/10/2022   PLT 265 0000000   Last metabolic panel Lab Results  Component Value Date   GLUCOSE 101 (H) 12/10/2022   NA 133 (L) 12/10/2022   K 4.1 12/10/2022   CL 103 12/10/2022   CO2 22 12/10/2022   BUN 15 12/10/2022   CREATININE 1.06 12/10/2022   GFRNONAA >60 12/10/2022   CALCIUM 9.7 12/10/2022   PROT 6.5 09/23/2022   ALBUMIN 4.3 12/29/2016   LABGLOB 2.2 04/22/2015   AGRATIO 2.0 04/22/2015   BILITOT 0.5 09/23/2022   ALKPHOS 53 12/29/2016   AST 19 09/23/2022   ALT 13 09/23/2022   ANIONGAP 8 12/10/2022    Assessment and Plan: Lower extremity weakness Patient with worsening lower extremity weakness to the point of relative ataxia since limited URI early in February. 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  Suspect this may be possible confounding issues to lower extremity weakness MRI of the L-spine with and without contrast is pending Urology and oncology consult as clinically indicated Follow    Iron deficiency anemia secondary to inadequate dietary iron intake Hgb 9.9 today  Baseline hgb 11-13 No active bleeding  Will monitor for now.    Hypothyroidism Cont synthroid  Check TSH   Essential hypertension BP stable Titrate home regimen  Mixed hyperlipidemia Holding statin in setting of LE weakness   Arthritis, degenerative Noted baseline degenerative disc disease in the lumbar spine from prior imaging 2016 May be a confounding issue to lower extremity weakness Pending MRI imaging Follow  Benign prostatic hyperplasia with urinary obstruction S/p cystolitholapaxy and HOLEP (holmium laser enucleation of the prostate ) on 11/26/2022 for urinary retention and bladder stones w/ Dr. Diamantina Providence       Advance Care Planning:   Code Status: Full Code   Consults: Neurology w/ Dr. Quinn Axe   Family Communication: No family at the bedside   Severity of Illness: The appropriate patient status for this patient is OBSERVATION. Observation status is judged to be reasonable and necessary in order to provide the required intensity of service to ensure the patient's safety. The patient's presenting symptoms, physical exam findings, and initial radiographic and laboratory data in the context of their medical condition is felt to  place them at decreased risk for further clinical deterioration. Furthermore, it is anticipated that the patient will be medically stable for discharge from the hospital within 2 midnights of admission.   Author: Deneise Lever, MD 12/10/2022 9:38 AM  For on call review [email or web address].

## 2022-12-10 NOTE — Progress Notes (Signed)
Follow up scrotal ultrasound with:  4.3 x 2.6 x 2.0 cm complex fluid collection is seen medial and superior to left testicle concerning for possible abscess  Currently on bactrim per outpt/urology recs Will formally consult Dr. Diamantina Providence w/ urology for formal evaluation.

## 2022-12-10 NOTE — Progress Notes (Signed)
Pt accidentally removed his PIV and refusing to get a new one at the moment. Pt does not have any IV medicine scheduled. MD notified and okay with pt not having PIV for tonight.

## 2022-12-10 NOTE — ED Notes (Signed)
Nurse calls lab to add CK

## 2022-12-10 NOTE — Assessment & Plan Note (Signed)
Hgb 9.9 today  Baseline hgb 11-13 No active bleeding  Will monitor for now.

## 2022-12-10 NOTE — Assessment & Plan Note (Addendum)
Noted recent diagnosis of urothelial carcinoma of the prostate with stage II disease concerning for metastatic spread from the prostate.  Suspect this may be possible confounding issues to lower extremity weakness MRI of the L-spine with and without contrast is pending Urology and oncology consult as clinically indicated Follow

## 2022-12-10 NOTE — ED Triage Notes (Signed)
Pt presents via EMS with c/o dry mouth x 6 hours. Reports got worse when he took his dentures out. Denies relief with rinsing mouth. Pt speaking with ease. Breathing unlabored with symmetric chest rise and fall. No cough or congestion. Pt alert and oriented.

## 2022-12-10 NOTE — Assessment & Plan Note (Signed)
BP stable Titrate home regimen 

## 2022-12-10 NOTE — Evaluation (Signed)
Physical Therapy Evaluation Patient Details Name: Joe Hardy MRN: [id number] DOB: 1941/03/12 Today's Date: 12/10/2022  History of Present Illness  Patient is a 82 year old male with worsening lower extremity weakness since URI in early February. Work up ongoing. History of cancer  Clinical Impression  Patient is agreeable to PT. He reports he was totally independent and drives prior to February. He has felt generalized weakness since then. He lives alone in an apartment.  Today, he required Mod A +  2 person assistance to stand from the bed. BLE jerking movements noted with movement. Pre-gait activity performed with rolling walker for UE support with ataxic movements of right leg more than left leg. Unable to progress to walking due to poor standing balance. Anticipate the need for frequent/constant supervision assistance at discharge. PT will continue to follow to maximize independence and facilitate return to prior level of function.      Recommendations for follow up therapy are one component of a multi-disciplinary discharge planning process, led by the attending physician.  Recommendations may be updated based on patient status, additional functional criteria and insurance authorization.  Follow Up Recommendations Can patient physically be transported by private vehicle: No     Assistance Recommended at Discharge Frequent or constant Supervision/Assistance  Patient can return home with the following  A lot of help with walking and/or transfers;Help with stairs or ramp for entrance;Assist for transportation;A little help with bathing/dressing/bathroom    Equipment Recommendations None recommended by PT  Recommendations for Other Services       Functional Status Assessment Patient has had a recent decline in their functional status and demonstrates the ability to make significant improvements in function in a reasonable and predictable amount of time.     Precautions /  Restrictions Precautions Precautions: Fall Restrictions Weight Bearing Restrictions: No      Mobility  Bed Mobility Overal bed mobility: Needs Assistance Bed Mobility: Supine to Sit, Sit to Supine     Supine to sit: Min assist Sit to supine: Min guard   General bed mobility comments: assistance for LE support to sit upright. increased time and effort required. jerky movement of BLE with movement that improves with closed chain activity    Transfers Overall transfer level: Needs assistance Equipment used: 2 person hand held assist, Rolling walker (2 wheels) Transfers: Sit to/from Stand Sit to Stand: Mod assist, +2 physical assistance           General transfer comment: 2 bouts of standing. jerky movements of BLE initially that improves    Ambulation/Gait             Pre-gait activities: performed side steps with ataxia noted (right leg more than left). forward and backward steps with right foot with general unsteadiness with dynamic activity. Up to minimal assistance for steadying with rolling walker for support General Gait Details: unsafe to attempt due to poor balance and limited standing tolerance  Stairs            Wheelchair Mobility    Modified Rankin (Stroke Patients Only)       Balance Overall balance assessment: Needs assistance Sitting-balance support: Feet supported, Bilateral upper extremity supported, Single extremity supported, No upper extremity supported Sitting balance-Leahy Scale: Fair Sitting balance - Comments: Able to progress to supervision level for sitting with UE support but initially requires MIN A to maintain sitting balance.   Standing balance support: Reliant on assistive device for balance, During functional activity, Bilateral upper extremity supported Standing balance-Leahy  Scale: Poor Standing balance comment: external support reuqired \                             Pertinent Vitals/Pain Pain  Assessment Pain Assessment: No/denies pain    Home Living Family/patient expects to be discharged to:: Private residence Living Arrangements: Alone Available Help at Discharge: Friend(s);Available PRN/intermittently Type of Home: Apartment Home Access: Level entry       Home Layout: One level Home Equipment: Rollator (4 wheels)      Prior Function Prior Level of Function : Independent/Modified Independent             Mobility Comments: Uses [employer] for community mobility. ADLs Comments: Neighbor assists witlh some cooking/IADL management, has a driving from a Environmental manager who helps him with driving, shopping, etc.     Hand Dominance   Dominant Hand: Right    Extremity/Trunk Assessment   Upper Extremity Assessment Upper Extremity Assessment: Overall WFL for tasks assessed (R rotator cuff injury with limited ROM)    Lower Extremity Assessment Lower Extremity Assessment: Generalized weakness    Cervical / Trunk Assessment Cervical / Trunk Assessment: Normal (fwd head/shoulders)  Communication   Communication: No difficulties  Cognition Arousal/Alertness: Awake/alert Behavior During Therapy: WFL for tasks assessed/performed Overall Cognitive Status: Within Functional Limits for tasks assessed                                 General Comments: patient does report feeling intermittently anxious        General Comments      Exercises     Assessment/Plan    PT Assessment Patient needs continued PT services  PT Problem List Decreased strength;Decreased activity tolerance;Decreased balance;Decreased mobility;Decreased safety awareness       PT Treatment Interventions DME instruction;Gait training;Stair training;Functional mobility training;Therapeutic activities;Therapeutic exercise;Balance training;Neuromuscular re-education;Cognitive remediation;Patient/family education    PT Goals (Current goals can be found in the Care Plan section)   Acute Rehab PT Goals Patient Stated Goal: to get better PT Goal Formulation: With patient Time For Goal Achievement: 12/24/22 Potential to Achieve Goals: Good    Frequency Min 2X/week     Co-evaluation PT/OT/SLP Co-Evaluation/Treatment: Yes Reason for Co-Treatment: Complexity of the patient's impairments (multi-system involvement);For patient/therapist safety;To address functional/ADL transfers PT goals addressed during session: Mobility/safety with mobility         AM-PAC PT "6 Clicks" Mobility  Outcome Measure Help needed turning from your back to your side while in a flat bed without using bedrails?: A Little Help needed moving from lying on your back to sitting on the side of a flat bed without using bedrails?: A Lot Help needed moving to and from a bed to a chair (including a wheelchair)?: A Lot Help needed standing up from a chair using your arms (e.g., wheelchair or bedside chair)?: A Lot Help needed to walk in hospital room?: Total Help needed climbing 3-5 steps with a railing? : Total 6 Click Score: 11    End of Session   Activity Tolerance: Patient tolerated treatment well Patient left: in bed;with call bell/phone within reach;with bed alarm set Nurse Communication: Mobility status PT Visit Diagnosis: Unsteadiness on feet (R26.81);Muscle weakness (generalized) (M62.81)    Time: [id number] PT Time Calculation (min) (ACUTE ONLY): 37 min   Charges:   PT Evaluation $PT Eval Low Complexity: 1 Low PT Treatments $Therapeutic Activity: 8-22 mins  Joe Hardy, PT, MPT   Joe Hardy 12/10/2022, 3:53 PM

## 2022-12-10 NOTE — Evaluation (Signed)
Occupational Therapy Evaluation Patient Details Name: Joe Hardy MRN: [id number] DOB: August 30, 1941 Today's Date: 12/10/2022   History of Present Illness Joe Hardy is a 82 y.o. male with medical history significant of hyperlipidemia, GERD, hypertension, iron deficiency anemia, bladder stones presenting with progressive worsening lower extremity weakness over the past 4 to 6 weeks.   Clinical Impression   Joe Hardy was seen for OT evaluation this date. Prior to hospital admission, pt was generally independent with ADL management. He reports having multiple supports for IADL management through an organization that assists individuals with former substance abuse issues with living independently. Pt lives alone in an apartment home with a level entrance. Pt presents to acute OT demonstrating impaired ADL performance and functional mobility 2/2 generalized weakness (BLE), decreased balance, and decreased safety awareness (See OT problem list for additional functional deficits). Pt currently requires +2 MOD A for functional transfers, +2 MOD A for LB ADL management from STS, and intermittent MIN A to maintain sitting balance at EOB.  Pt would benefit from skilled OT services to address noted impairments and functional limitations (see below for any additional details) in order to maximize safety and independence while minimizing falls risk and caregiver burden. Pt will benefit from continues OT services (< 3hr/day) to address noted deficits and improve functional independence upon acute hospital DC.      Recommendations for follow up therapy are one component of a multi-disciplinary discharge planning process, led by the attending physician.  Recommendations may be updated based on patient status, additional functional criteria and insurance authorization.   Assistance Recommended at Discharge Intermittent Supervision/Assistance  Patient can return home with the following Two people to help with  walking and/or transfers;Two people to help with bathing/dressing/bathroom;Assist for transportation;Help with stairs or ramp for entrance;Assistance with cooking/housework    Functional Status Assessment  Patient has had a recent decline in their functional status and demonstrates the ability to make significant improvements in function in a reasonable and predictable amount of time.  Equipment Recommendations  BSC/3in1    Recommendations for Other Services       Precautions / Restrictions Precautions Precautions: Fall Restrictions Weight Bearing Restrictions: No      Mobility Bed Mobility Overal bed mobility: Needs Assistance Bed Mobility: Supine to Sit, Sit to Supine     Supine to sit: Min assist Sit to supine: Min guard   General bed mobility comments: Initial MIN A to come to sit at EOB 2/2 increased BLE spasms with open kinetic chain movement. Improves as session progresses.    Transfers Overall transfer level: Needs assistance Equipment used: 2 person hand held assist, Rolling walker (2 wheels) Transfers: Sit to/from Stand Sit to Stand: Mod assist, +2 physical assistance           General transfer comment: +2 MIN/MOD for side stepping at EOB with RW.      Balance Overall balance assessment: Needs assistance Sitting-balance support: Feet supported, Bilateral upper extremity supported, Single extremity supported, No upper extremity supported Sitting balance-Leahy Scale: Fair Sitting balance - Comments: Able to progress to supervision level for sitting with UE support but initially requires MIN A to maintain sitting balance.   Standing balance support: Reliant on assistive device for balance, During functional activity, Bilateral upper extremity supported Standing balance-Leahy Scale: Poor                             ADL either performed or assessed with clinical judgement  ADL Overall ADL's : Needs assistance/impaired                                        General ADL Comments: Pt functionally limited by generalzied weakness, decreased activity tolerance, decreased safety awareness. He requires SUPERVISION for bed mobility, +2 mod A for STS attempts with initial knee blocking 2/2 increased leg spasms with open chain movements. He requires initial MIN A to maintain sitting balance at EOB. Able to doff/don bilat hospital socks at EOB with close supervision for safety.     Vision Patient Visual Report: No change from baseline       Perception     Praxis      Pertinent Vitals/Pain Pain Assessment Pain Assessment: No/denies pain     Hand Dominance Right   Extremity/Trunk Assessment Upper Extremity Assessment Upper Extremity Assessment: Overall WFL for tasks assessed (Hx of R rotator cuff tear with functional limitations/weakness.)   Lower Extremity Assessment Lower Extremity Assessment: Generalized weakness;Defer to PT evaluation (Pt noted with ataxic leg movements during functional mobility attempts R>L.)   Cervical / Trunk Assessment Cervical / Trunk Assessment: Normal (fwd head/shoulders)   Communication     Cognition Arousal/Alertness: Awake/alert Behavior During Therapy: WFL for tasks assessed/performed Overall Cognitive Status: Within Functional Limits for tasks assessed                                       General Comments       Exercises Other Exercises Other Exercises: Pt educated on role of OT in acute setting, safe use of AE/DME for ADL management, falls prevention strategies for home and hospital and safe transfer technique.   Shoulder Instructions      Home Living Family/patient expects to be discharged to:: Private residence Living Arrangements: Alone Available Help at Discharge: Friend(s);Available PRN/intermittently Type of Home: Apartment Home Access: Level entry     Home Layout: One level               Home Equipment: Rollator (4 wheels)           Prior Functioning/Environment Prior Level of Function : Independent/Modified Independent             Mobility Comments: Uses [employer] for community mobility. ADLs Comments: Neighbor assists witlh some cooking/IADL management, has a driving from a Environmental manager who helps him with driving, shopping, etc.        OT Problem List: Decreased strength;Decreased coordination;Decreased range of motion;Decreased activity tolerance;Decreased safety awareness;Impaired balance (sitting and/or standing);Decreased knowledge of use of DME or AE;Impaired UE functional use      OT Treatment/Interventions: Self-care/ADL training;Therapeutic exercise;Therapeutic activities;DME and/or AE instruction;Patient/family education;Balance training;Energy conservation    OT Goals(Current goals can be found in the care plan section) Acute Rehab OT Goals Patient Stated Goal: To feel better OT Goal Formulation: With patient Time For Goal Achievement: 12/24/22 ADL Goals Pt Will Perform Grooming: sitting;with set-up;with supervision Pt Will Perform Lower Body Dressing: sit to/from stand;with supervision;with set-up;with adaptive equipment Pt Will Transfer to Toilet: bedside commode;with set-up;with supervision;ambulating Pt Will Perform Toileting - Clothing Manipulation and hygiene: sit to/from stand;with adaptive equipment;with set-up;with supervision  OT Frequency: Min 2X/week    Co-evaluation              AM-PAC OT "6 Clicks" Daily  Activity     Outcome Measure Help from another person eating meals?: A Little Help from another person taking care of personal grooming?: A Little Help from another person toileting, which includes using toliet, bedpan, or urinal?: A Little Help from another person bathing (including washing, rinsing, drying)?: A Little Help from another person to put on and taking off regular upper body clothing?: A Little Help from another person to put on and taking off regular  lower body clothing?: A Little 6 Click Score: 18   End of Session Equipment Utilized During Treatment: Rolling walker (2 wheels) Nurse Communication: Mobility status  Activity Tolerance: Patient tolerated treatment well Patient left: in bed;with call bell/phone within reach;with bed alarm set  OT Visit Diagnosis: Other abnormalities of gait and mobility (R26.89);Muscle weakness (generalized) (M62.81)                Time: [id number] OT Time Calculation (min): 36 min Charges:  OT General Charges $OT Visit: 1 Visit OT Evaluation $OT Eval Moderate Complexity: 1 Mod OT Treatments $Self Care/Home Management : 8-22 mins  Shara Blazing, M.S., OTR/L 12/10/22, 3:38 PM

## 2022-12-10 NOTE — Assessment & Plan Note (Signed)
S/p cystolitholapaxy and HOLEP on 11/26/2022 for urinary retention and bladder stones w/ Dr. Diamantina Providence

## 2022-12-10 NOTE — Assessment & Plan Note (Signed)
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

## 2022-12-10 NOTE — Assessment & Plan Note (Addendum)
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 Diamantina Providence
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
## 2022-12-10 NOTE — Assessment & Plan Note (Signed)
Noted right testicular pain from outpatient evaluation on 326 Started on Bactrim per urology recommendations Had testicular ultrasound tentatively plan outpatient Will order in-house Continue Bactrim Follow

## 2022-12-10 NOTE — ED Notes (Signed)
This RN and another RN attempted to ambulate pt. Pt attempted to sit on the side of the bed and he was moving his legs all over the place and this RN determined pt was not safe to stand. Pt stated "see what's happening? That's not right" MD Karma Greaser notified.

## 2022-12-10 NOTE — Progress Notes (Signed)
PT Cancellation Note  Patient Details Name: Joe Hardy MRN: [id number] DOB: 1941/05/10   Cancelled Treatment:    Reason Eval/Treat Not Completed: Patient at procedure or test/unavailable (PT to continue with attempts as appropriate)  Minna Merritts, PT, MPT  Joe Hardy 12/10/2022, 11:09 AM

## 2022-12-10 NOTE — Assessment & Plan Note (Signed)
Cont synthroid.   Check TSH

## 2022-12-10 NOTE — Consult Note (Signed)
Urology Consult   I have been asked to see the patient by Dr. Ernestina Patches, for evaluation and management of abnormal scrotal ultrasound findings.  Chief Complaint: Dry mouth  HPI:  Joe Hardy is a 82 y.o. male with a number of comorbidities, confusion, frailty, psych history, and poor social support network who developed Foley dependent urinary retention with multiple large bladder stones and underwent HOLEP and bladder stone removal with me 11/26/2022.  He had multiple ER visits over the next few days for unclear indications, he repeatedly said that his Foley " fell out" but extensive review of the ER notes state that the catheter had never fallen out or been exchanged, and when I saw him in clinic at follow-up 12/01/2022 I removed his original catheter that had been placed in the OR.  On his original preop CT for gross hematuria 11/10/2022 there was a solitary 1.3 cm enhancing left pelvic lymph node worrisome for malignancy.  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  I had reviewed these findings with him on 12/01/2022 need for further evaluation with oncology for consideration of additional treatments like chemotherapy or radiation, or observation with his age and frailty.  He was scheduled to see them today, but missed that appointment because he was admitted overnight.  Extensive review of the records was performed.  He was admitted overnight for " dry mouth."  He states no matter how much he drank his mouth felt dry and he was worried about going to sleep and being dehydrated so he ended up presenting to the ER at 12:30 AM this morning.  They felt he could be discharged after a relatively benign workup, however he developed some leg weakness and shaking and was admitted for further hospitalist and potential neurology evaluation.  He was  seen by his PCP for right scrotal pain earlier in the week on 12/06/2022, urinalysis appeared infected and culture ultimately grew Serratia, and he was treated with culture appropriate Bactrim and remains on this antibiotic.  He denies any scrotal pain today.  He is having no problems with urination, specifically no dysuria, gross hematuria, or incontinence.  A scrotal ultrasound was ordered by the hospitalist which suggested a possible left-sided scrotal abscess.  He reports his right-sided scrotal pain has improved since starting the Bactrim.  He is primarily concerned today about his ongoing dry mouth and leg weakness.   PMH: Past Medical History:  Diagnosis Date   Bladder stones    Depression    GERD (gastroesophageal reflux disease)    Hyperlipidemia    Hypertension    Iron deficiency anemia    Migraine    Sleep apnea     Surgical History: Past Surgical History:  Procedure Laterality Date   COLONOSCOPY     CYSTOSCOPY WITH LITHOLAPAXY N/A 11/26/2022   Procedure: CYSTOSCOPY WITH LITHOLAPAXY;  Surgeon: Billey Co, MD;  Location: [facility] ORS;  Service: Urology;  Laterality: N/A;   HOLEP-LASER ENUCLEATION OF THE PROSTATE WITH MORCELLATION N/A 11/26/2022   Procedure: HOLEP-LASER ENUCLEATION OF THE PROSTATE WITH MORCELLATION;  Surgeon: Billey Co, MD;  Location: [facility] ORS;  Service: Urology;  Laterality: N/A;   oral surgery     TONSILLECTOMY      Allergies:  Allergies  Allergen Reactions   Fluoxetine     insomnia   Venlafaxine     difficulty sleeping  Family History: Family History  Problem Relation Age of Onset   Tuberculosis Mother    Asthma Mother    Emphysema Father    Heart attack Father     Social History:  reports that he quit smoking about 5 years ago. His smoking use included cigarettes. He has a 30.00 pack-year smoking history. He has quit using smokeless tobacco. He reports that he does not currently use drugs after having used the following drugs:  Marijuana. He reports that he does not drink alcohol.  Physical Exam: BP (!) 141/86 (BP Location: Left Arm)   Pulse 99   Temp 97.9 F (36.6 C) (Oral)   Resp 18   Ht 5\' 9"  (1.753 m)   Wt 68 kg   SpO2 97%   BMI 22.15 kg/m    Constitutional:  Alert and oriented, No acute distress. Cardiovascular: No clubbing, cyanosis, or edema. Respiratory: Normal respiratory effort, no increased work of breathing. GI: Abdomen is soft, nontender, nondistended, no abdominal masses GU: Circumcised phallus with patent meatus, testicles 20 cc and descended bilaterally, mild right testicular firmness consistent with right epididymitis, left testicle benign and nontender, no evidence of abscess, no skin changes  Laboratory Data: Reviewed, no leukocytosis, normal renal function  Pertinent Imaging: I have personally reviewed the scrotal ultrasound suggesting right-sided epididymitis, fluid collection on the left side appears more consistent with a cyst or hydrocele, not abscess, especially in the setting of a benign clinical exam.  Assessment & Plan:   82 year old male with some dementia, poor social support system who was admitted overnight with dry mouth and difficulty walking/weak legs.  He underwent a HOLEP on 11/26/2022 for urinary retention with multiple bladder stones, Foley has since been removed and he is voiding normally with no more hematuria, no dysuria, no incontinence.  Interestingly, was found to have high-grade urothelial cell carcinoma invading into the prostate from intraoperative pathology, as well as a 1.3 cm pelvic lymph node on original CT worrisome for possible metastatic urothelial cell carcinoma.  I have reviewed those findings with him previously at length, and recommended referral to oncology, he has not yet had that outpatient appointment yet secondary to the admission today.  I was consulted regarding a possible scrotal abscess.  He has no leukocytosis, no fever, no scrotal pain,  right-sided scrotal pain has resolved after starting Bactrim for recent epididymitis after surgery, his exam is benign with no evidence of abscess, mild right testicular tenderness consistent with right-sided epididymitis.  Recommendations: Continue Bactrim for 10 to 14-day course total Will need to reschedule outpatient oncology follow-up regarding new diagnosis of suspected metastatic urothelial cell carcinoma Keep urology follow-up as scheduled in 2 months  Billey Co, MD  Total time spent on the floor was 60 minutes, with greater than 50% spent in counseling and coordination of care with the patient regarding scrotal ultrasound findings, known diagnosis of urothelial cell carcinoma and importance of oncology follow-up.  [facility] [address] [place], [place] [address] [phone]

## 2022-12-11 DIAGNOSIS — R682 Dry mouth, unspecified: Secondary | ICD-10-CM | POA: Diagnosis present

## 2022-12-11 DIAGNOSIS — F0394 Unspecified dementia, unspecified severity, with anxiety: Secondary | ICD-10-CM | POA: Diagnosis present

## 2022-12-11 DIAGNOSIS — K117 Disturbances of salivary secretion: Secondary | ICD-10-CM | POA: Diagnosis present

## 2022-12-11 DIAGNOSIS — Z87891 Personal history of nicotine dependence: Secondary | ICD-10-CM | POA: Diagnosis not present

## 2022-12-11 DIAGNOSIS — Z8249 Family history of ischemic heart disease and other diseases of the circulatory system: Secondary | ICD-10-CM | POA: Diagnosis not present

## 2022-12-11 DIAGNOSIS — Z79890 Hormone replacement therapy (postmenopausal): Secondary | ICD-10-CM | POA: Diagnosis not present

## 2022-12-11 DIAGNOSIS — R262 Difficulty in walking, not elsewhere classified: Secondary | ICD-10-CM | POA: Diagnosis not present

## 2022-12-11 DIAGNOSIS — D509 Iron deficiency anemia, unspecified: Secondary | ICD-10-CM | POA: Diagnosis present

## 2022-12-11 DIAGNOSIS — F32A Depression, unspecified: Secondary | ICD-10-CM | POA: Diagnosis present

## 2022-12-11 DIAGNOSIS — E782 Mixed hyperlipidemia: Secondary | ICD-10-CM | POA: Diagnosis present

## 2022-12-11 DIAGNOSIS — C61 Malignant neoplasm of prostate: Secondary | ICD-10-CM | POA: Diagnosis present

## 2022-12-11 DIAGNOSIS — E039 Hypothyroidism, unspecified: Secondary | ICD-10-CM | POA: Diagnosis present

## 2022-12-11 DIAGNOSIS — K219 Gastro-esophageal reflux disease without esophagitis: Secondary | ICD-10-CM | POA: Diagnosis present

## 2022-12-11 DIAGNOSIS — N451 Epididymitis: Secondary | ICD-10-CM | POA: Diagnosis present

## 2022-12-11 DIAGNOSIS — Z888 Allergy status to other drugs, medicaments and biological substances status: Secondary | ICD-10-CM | POA: Diagnosis not present

## 2022-12-11 DIAGNOSIS — N401 Enlarged prostate with lower urinary tract symptoms: Secondary | ICD-10-CM | POA: Diagnosis present

## 2022-12-11 DIAGNOSIS — M4800 Spinal stenosis, site unspecified: Secondary | ICD-10-CM | POA: Diagnosis present

## 2022-12-11 DIAGNOSIS — C7919 Secondary malignant neoplasm of other urinary organs: Secondary | ICD-10-CM | POA: Diagnosis present

## 2022-12-11 DIAGNOSIS — I10 Essential (primary) hypertension: Secondary | ICD-10-CM | POA: Diagnosis present

## 2022-12-11 DIAGNOSIS — Z79899 Other long term (current) drug therapy: Secondary | ICD-10-CM | POA: Diagnosis not present

## 2022-12-11 DIAGNOSIS — M5136 Other intervertebral disc degeneration, lumbar region: Secondary | ICD-10-CM | POA: Diagnosis present

## 2022-12-11 DIAGNOSIS — G8929 Other chronic pain: Secondary | ICD-10-CM | POA: Diagnosis present

## 2022-12-11 DIAGNOSIS — M48061 Spinal stenosis, lumbar region without neurogenic claudication: Secondary | ICD-10-CM | POA: Diagnosis present

## 2022-12-11 DIAGNOSIS — R531 Weakness: Secondary | ICD-10-CM | POA: Diagnosis present

## 2022-12-11 DIAGNOSIS — Z825 Family history of asthma and other chronic lower respiratory diseases: Secondary | ICD-10-CM | POA: Diagnosis not present

## 2022-12-11 DIAGNOSIS — D508 Other iron deficiency anemias: Secondary | ICD-10-CM | POA: Diagnosis present

## 2022-12-11 LAB — CBC
HCT: 30.4 % — ABNORMAL LOW (ref 39.0–52.0)
Hemoglobin: 9.7 g/dL — ABNORMAL LOW (ref 13.0–17.0)
MCH: 21.3 pg — ABNORMAL LOW (ref 26.0–34.0)
MCHC: 31.9 g/dL (ref 30.0–36.0)
MCV: 66.7 fL — ABNORMAL LOW (ref 80.0–100.0)
Platelets: 266 10*3/uL (ref 150–400)
RBC: 4.56 MIL/uL (ref 4.22–5.81)
RDW: 16.2 % — ABNORMAL HIGH (ref 11.5–15.5)
WBC: 6.4 10*3/uL (ref 4.0–10.5)
nRBC: 0 % (ref 0.0–0.2)

## 2022-12-11 LAB — COMPREHENSIVE METABOLIC PANEL
ALT: 10 U/L (ref 0–44)
AST: 14 U/L — ABNORMAL LOW (ref 15–41)
Albumin: 2.9 g/dL — ABNORMAL LOW (ref 3.5–5.0)
Alkaline Phosphatase: 38 U/L (ref 38–126)
Anion gap: 9 (ref 5–15)
BUN: 19 mg/dL (ref 8–23)
CO2: 22 mmol/L (ref 22–32)
Calcium: 9.5 mg/dL (ref 8.9–10.3)
Chloride: 107 mmol/L (ref 98–111)
Creatinine, Ser: 0.93 mg/dL (ref 0.61–1.24)
GFR, Estimated: >60 mL/min (ref >60)
Glucose, Bld: 94 mg/dL (ref 70–99)
Potassium: 3.9 mmol/L (ref 3.5–5.1)
Sodium: 138 mmol/L (ref 135–145)
Total Bilirubin: 0.7 mg/dL (ref 0.3–1.2)
Total Protein: 6 g/dL — ABNORMAL LOW (ref 6.5–8.1)

## 2022-12-11 LAB — VITAMIN B12: Vitamin B-12: 1279 pg/mL — ABNORMAL HIGH (ref 180–914)

## 2022-12-11 LAB — GLUCOSE, CAPILLARY
Glucose-Capillary: 83 mg/dL (ref 70–99)
Glucose-Capillary: 99 mg/dL (ref 70–99)
Glucose-Capillary: 122 mg/dL — ABNORMAL HIGH (ref 70–99)

## 2022-12-11 MED ORDER — MAGNESIUM CITRATE PO SOLN
1.0000 | Freq: Once | ORAL | Status: DC
  Filled 2022-12-11: qty 296

## 2022-12-11 MED ORDER — POLYETHYLENE GLYCOL 3350 17 G PO PACK
34.0000 g | PACK | ORAL | Status: AC
  Administered 2022-12-11 (×5): 34 g via ORAL
  Filled 2022-12-11 (×5): qty 2

## 2022-12-11 MED ORDER — MIRTAZAPINE 15 MG PO TABS
30.0000 mg | ORAL_TABLET | Freq: Once | ORAL | Status: AC
  Administered 2022-12-11: 30 mg via ORAL
  Filled 2022-12-11: qty 2

## 2022-12-11 MED ORDER — TRAZODONE HCL 50 MG PO TABS
150.0000 mg | ORAL_TABLET | Freq: Once | ORAL | Status: AC
  Administered 2022-12-11: 150 mg via ORAL
  Filled 2022-12-11: qty 1

## 2022-12-11 NOTE — Progress Notes (Signed)
Pt again asking for HS Trazodone and Mirtazapine.  Discussed with Sharion Settler NP and will do another one time dose tonight and request f/u from attending regarding ordering home meds. Ayesha Mohair BSN RN [place] 12/11/2022, 9:48 PM

## 2022-12-11 NOTE — Consult Note (Signed)
Referring Physician:  No referring provider defined for this encounter.  Primary Physician:  Jearld Fenton, NP  Chief Complaint:  problems with lower extremity coordination, subjective weakness.  History of Present Illness: Joe Hardy is a 82 y.o. male who presents with the chief complaint of problems with lower extremity coordination, subjective weakness.  Briefly, he is a 82 y.o. male with medical history significant of hyperlipidemia, GERD, hypertension, iron deficiency anemia, bladder stones presenting with lower extremity weakness over the last few weeks, with worsening since Thursday.   Patient reports having a viral URI early in February that lasted for about a week.  Patient reports significant lower extremity weakness since this point.  Noted baseline history of degenerative disc disease in the lumbar spine.  Has been relatively stable.  Patient has been able to ambulate on his own for the most part prior to this.  Now with worsening difficulty with ambulation to the point where patient reports having to sit down when opening the fridge for food.  However, as he has become hydrated at the hospital, was able to walk a few steps with PT and having less coordination issues.    The symptoms are causing a significant impact on the patient's life.   Review of Systems:  A 10 point review of systems is negative, except for the pertinent positives and negatives detailed in the HPI.  Past Medical History: Past Medical History:  Diagnosis Date   Bladder stones    Depression    GERD (gastroesophageal reflux disease)    Hyperlipidemia    Hypertension    Iron deficiency anemia    Migraine    Sleep apnea     Past Surgical History: Past Surgical History:  Procedure Laterality Date   COLONOSCOPY     CYSTOSCOPY WITH LITHOLAPAXY N/A 11/26/2022   Procedure: CYSTOSCOPY WITH LITHOLAPAXY;  Surgeon: Billey Co, MD;  Location: [facility] ORS;  Service: Urology;  Laterality: N/A;    HOLEP-LASER ENUCLEATION OF THE PROSTATE WITH MORCELLATION N/A 11/26/2022   Procedure: HOLEP-LASER ENUCLEATION OF THE PROSTATE WITH MORCELLATION;  Surgeon: Billey Co, MD;  Location: [facility] ORS;  Service: Urology;  Laterality: N/A;   oral surgery     TONSILLECTOMY      Problem List: Patient Active Problem List   Diagnosis Date Noted   Ambulatory dysfunction 12/10/2022   Lower extremity weakness 12/10/2022   Testicular pain, right 12/10/2022   Urothelial carcinoma ([place]) 12/07/2022   Iron deficiency anemia secondary to inadequate dietary iron intake 06/03/2022   Overweight with body mass index (BMI) of 26 to 26.9 in adult 03/09/2021   Constipation due to slow transit 05/02/2018   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   Depression, major, recurrent, in partial remission ([place]) 04/14/2015   Arthritis, degenerative 04/14/2015   Mixed hyperlipidemia 04/14/2015   Essential hypertension 04/14/2015   Hypothyroidism 04/14/2015   Insomnia 04/14/2015   Headache, migraine 04/14/2015    Allergies: Allergies as of 12/10/2022 - Review Complete 12/10/2022  Allergen Reaction Noted   Fluoxetine  04/14/2015   Venlafaxine  04/14/2015    Medications: @ENCMED @  Social History: Social History   Tobacco Use   Smoking status: Former    Packs/day: 1.50    Years: 20.00    Additional pack years: 0.00    Total pack years: 30.00    Types: Cigarettes    Quit date: 05/02/2017    Years since quitting: 5.6   Smokeless tobacco: Former   Tobacco comments:  Starts and stops related to anxiety rather than drinking alcohol  Vaping Use   Vaping Use: Never used  Substance Use Topics   Alcohol use: No    Alcohol/week: 0.0 standard drinks of alcohol    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  Psychiatric: Patient is non-anxious.  Head:  Pupils equal, round, and reactive to light.  ENT:  Oral mucosa appears well hydrated.  Neck:   Supple.  Full range of motion.  Respiratory: Patient is breathing without any difficulty.  Extremities: No edema.  Vascular: Palpable pulses.  Skin:   On exposed skin, there are no abnormal skin lesions.  NEUROLOGICAL:  General: In no acute distress.   Awake, alert, oriented to person, place, and time.  Pupils equal round and reactive to light.  Facial tone is symmetric.  Tongue protrusion is midline.  There is no pronator drift.    Strength: Side Biceps Triceps Deltoid Interossei Grip Wrist Ext. Wrist Flex.  R 5 5 4  pain  5 5 5 5   L 5 5 5 5 5 5 5    Side Iliopsoas Quads Hamstring PF DF EHL  R 5 5 5 5 5 5   L 5 5 5 5 5 5    Reflexes are 1+ and symmetric at the biceps, triceps, brachioradialis, patella and achilles.   Bilateral upper and lower extremity sensation is intact to light touch and pin prick.  Clonus is not present.  Toes are down-going.  Gait is normal.  No difficulty with tandem gait.  Hoffman's is absent.  Imaging: XAM: MRI CERVICAL, THORACIC AND LUMBAR SPINE WITHOUT AND WITH CONTRAST   TECHNIQUE: Multiplanar and multiecho pulse sequences of the cervical spine, to include the craniocervical junction and cervicothoracic junction, and thoracic and lumbar spine, were obtained without and with intravenous contrast.   CONTRAST:  51mL GADAVIST GADOBUTROL 1 MMOL/ML IV SOLN   COMPARISON:  CT abdomen/pelvis 11/09/2022, cervical spine CT and lumbar radiographs 01/10/2015   FINDINGS: MRI CERVICAL SPINE FINDINGS   Alignment: There is unchanged mild anterolisthesis of C5 on C6. There is straightening of the normal cervical lordosis.   Vertebrae: A type  2 dens fracture is again seen, similar in alignment to the study from 2016 consistent with chronic fracture. Edema in the adjacent C2 vertebral body and right C1 and C2 lateral masses is favored degenerative in nature.   Otherwise, vertebral body heights are preserved. There is no evidence of new acute fracture. There is mild degenerative endplate edema at [id number] and C7-T1   Cord: Normal in signal and morphology. There is no abnormal enhancement.   Posterior Fossa, vertebral arteries, paraspinal tissues: The posterior fossa is assessed on the separately dictated brain MRI. The vertebral artery flow voids are normal. The paraspinal soft tissues are unremarkable.   Disc levels:   C2-C3: Left worse than right facet arthropathy without significant spinal canal or neural foraminal stenosis   C3-C4: There is mild endplate spurring and facet arthropathy resulting mild right and no significant left neural foraminal stenosis and no significant spinal canal stenosis   C4-C5: There is left worse than right endplate  spurring and facet arthropathy resulting in severe left and mild right neural foraminal stenosis without significant spinal canal stenosis   C5-C6: There is anterolisthesis with uncovering of the disc posteriorly and associated uncovertebral and facet arthropathy, worse on the right, but no significant spinal canal or neural foraminal stenosis   C6-C7: Mild endplate spurring and facet arthropathy without significant spinal canal or neural foraminal stenosis   C7-T1: No significant spinal canal or neural foraminal stenosis.   MRI THORACIC SPINE FINDINGS   Alignment:  Normal.   Vertebrae: There is mild compression deformity of the T1 vertebral body with proximally 10% loss of vertebral body height. There is mild marrow edema and enhancement along the superior endplate. There is no endplate irregularity or destruction. There is no signal abnormality or enhancement the  intervening disc space. The loss of height appears similar to the CT cervical spine from 2016 and is favored to reflect a chronic compression fracture with a superimposed Schmorl's node.   The other vertebral body heights are preserved. Background marrow signal is normal. There is mild degenerative endplate marrow edema in the mid and lower thoracic spine. There is no suspicious marrow signal abnormality.   Cord:  Normal in signal and morphology without abnormal enhancement.   Paraspinal and other soft tissues: Unremarkable.   Disc levels:   There is disc desiccation and narrowing in the mid and lower thoracic spine. There are small disc protrusions in the mid and lower thoracic spine without significant spinal canal stenosis. There is overall mild facet arthropathy without high-grade neural foraminal stenosis. There is no evidence of cord or nerve root impingement.   MRI LUMBAR SPINE FINDINGS   Segmentation: Standard; the lowest formed disc space is designated L5-S1.   Alignment:  There is levocurvature centered at L2-L3.   Vertebrae: Vertebral body heights are preserved. Background marrow signal is normal. There is edema and patchy enhancement along the L1-L2 disc space, eccentric to the left. There is no endplate irregularity or destruction. There is no signal abnormality or enhancement in the intervening disc space. There is no other marrow edema or enhancement.   Conus medullaris and cauda equina: Conus extends to the L1 level. Conus and cauda equina appear normal. There is no abnormal thickening or enhancement of the cauda equina nerve roots to suggest Guillain-Barre syndrome.   Paraspinal and other soft tissues: A left renal cyst is noted requiring no specific imaging follow-up. The bladder is distended. The paraspinal soft tissues are unremarkable.   Disc levels:   T12-L1: There is a minimal disc bulge without significant spinal canal or neural foraminal  stenosis   L1-L2: There is a diffuse disc bulge, degenerative endplate spurring, and mild bilateral facet arthropathy resulting in moderate spinal canal stenosis with bilateral subarticular zone narrowing and no significant neural foraminal stenosis   L2-L3: There is a diffuse disc bulge, degenerative endplate spurring, and mild bilateral facet arthropathy resulting in severe spinal canal stenosis with effacement of the thecal sac and compression of the cauda equina nerve roots, and mild bilateral neural foraminal stenosis   L3-L4: There is a diffuse disc bulge eccentric to the right, degenerative endplate spurring, and mild facet arthropathy resulting in moderate spinal canal stenosis with right worse than left subarticular zone narrowing and mild bilateral neural foraminal stenosis   L4-L5: There is a diffuse disc bulge, degenerative endplate spurring, and advanced bilateral facet arthropathy resulting in severe spinal canal stenosis with effacement of the thecal sac and compression of the cauda equina  nerve roots, and moderate right and mild left neural foraminal stenosis   L5-S1: There is a diffuse disc bulge eccentric to the right and advanced left and moderate right facet arthropathy resulting in right subarticular zone effacement with likely impingement of the traversing right S1 nerve root, and severe right worse than left neural foraminal stenosis.   IMPRESSION: 1. No abnormal signal or enhancement involving the spinal cord or cauda equina nerve roots. 2. Remote nonunited type 2 dens fracture. Edema and enhancement in the adjacent C2 vertebral body and right C1 and C2 lateral masses is favored degenerative/reactive in nature, with infection or acute injury considered less likely. 3. 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 No other high-grade neural foraminal stenosis, and no  significant spinal canal stenosis at any level. 4. Mild compression deformity of the T1 vertebral body appears similar to the CT cervical spine from 2016. Mild marrow edema along the superior endplate is favored degenerative in nature and possibly related to a superimposed Schmorl's node; however, acute injury superimposed on pre-existing fracture is not entirely excluded. 5. Overall mild degenerative change of the thoracic spine without high-grade spinal canal or neural foraminal stenosis. 6. 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 7. Degenerative endplate edema and enhancement at L1-L2, eccentric to the left. No endplate irregularity or destruction to suggest infection. 8. Right subarticular zone effacement with impingement of the traversing right S1 nerve root, and severe right worse than left neural foraminal stenosis at L5-S1. 9. Distended urinary bladder.      I have personally reviewed the images and agree with the above interpretation.  Assessment and Plan: Mr. Schloss is a pleasant 82 y.o. male with worsening lower extremity coordination and weakness.  I have discussed the condition with the patient, including showing the radiographs and discussing treatment options in layman's terms.  The patient may benefit from conservative management.  Thus, I have recommended the following:   1) No acute surgical indications.  2) Would recommend aggressive PT/OT  3)  Outpatient follow up for lumbar stenosis.  I will see the patient back in a few weeks to gauge progress.  Thank you for involving me in the care of this patient. I will keep you apprised of the patient's progress.   This note was partially dictated using voice recognition software, so please excuse any errors that were not corrected.   Joe Gainer MD, PhD

## 2022-12-11 NOTE — Progress Notes (Signed)
  PROGRESS NOTE    Joe Hardy  [id number] DOB: 1941/03/24 DOA: 12/10/2022 PCP: Jearld Fenton, NP  [id number]  LOS: 0 days   Brief hospital course:   Assessment & Plan: Joe Hardy is a 82 y.o. male with medical history significant of hyperlipidemia, GERD, hypertension, iron deficiency anemia, bladder stones who came in due to severe mouth dryness and then found to have lower extremity weakness.   Lower extremity weakness Per neuro, "5/5 strength in all muscle groups (BLE and otherwise) except for R delt 2/2 old rotator cuff injury. Joe Hardy can be ruled out since he has reflexes throughout."  Pt has subjective weakness and imbalance likely from deconditioning. --PT/OT rec SNF rehab  Chronic spinal stenosis --pt had no complaint of pain.  Had no concerning symptoms.  Neurosurgery consulted, no acute surgical indications. 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   --Keep urology follow-up as scheduled in 2 months   Iron deficiency anemia secondary to inadequate dietary iron intake --Hgb 9's. 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 Diamantina Providence    DVT  prophylaxis: Lovenox SQ Code Status: Full code  Family Communication:  Level of care: Med-Surg Dispo:   The patient is from: home Anticipated d/c is to: SNF rehab Anticipated d/c date is: whenever bed available   Subjective and Interval History:  Pt complained of feeling weak and dry mouth.   Objective: Vitals:   12/10/22 2008 12/11/22 0809 12/11/22 1543 12/11/22 1815  BP: 127/84 119/78 129/71 (!) 149/90  Pulse: 97 94 100 97  Resp: 18 18 18 18   Temp: 99.1 F (37.3 C) 98.2 F (36.8 C) 98.3 F (36.8 C)   TempSrc: Oral Oral Oral   SpO2: 96% 96% 98% 99%  Weight:      Height:        Intake/Output Summary (Last 24 hours) at 12/11/2022 1829 Last data filed at 12/11/2022 1440 Gross per 24 hour  Intake 720 ml  Output 1280 ml  Net -560 ml   Filed Weights   12/10/22 0031  Weight: 68 kg    Examination:   Constitutional: NAD, AAOx3 HEENT: conjunctivae and lids normal, EOMI CV: No cyanosis.   RESP: normal respiratory effort, on RA Neuro: II - XII grossly intact.   Psych: Normal mood and affect.  Appropriate judgement and reason   Data Reviewed: I have personally reviewed labs and imaging studies  Time spent: 50 minutes  Enzo Bi, MD [facility] If 7PM-7AM, please contact night-coverage 12/11/2022, 6:29 PM

## 2022-12-12 DIAGNOSIS — R262 Difficulty in walking, not elsewhere classified: Secondary | ICD-10-CM | POA: Diagnosis not present

## 2022-12-12 MED ORDER — TRAZODONE HCL 50 MG PO TABS
150.0000 mg | ORAL_TABLET | Freq: Every day | ORAL | Status: DC
  Administered 2022-12-12 – 2022-12-13 (×2): 150 mg via ORAL
  Filled 2022-12-12 (×2): qty 1

## 2022-12-12 MED ORDER — BACLOFEN 10 MG PO TABS
5.0000 mg | ORAL_TABLET | Freq: Two times a day (BID) | ORAL | Status: DC
  Administered 2022-12-12 – 2022-12-14 (×5): 5 mg via ORAL
  Filled 2022-12-12 (×5): qty 1

## 2022-12-12 MED ORDER — MIRTAZAPINE 15 MG PO TABS
30.0000 mg | ORAL_TABLET | Freq: Every day | ORAL | Status: DC
  Administered 2022-12-12 – 2022-12-13 (×2): 30 mg via ORAL
  Filled 2022-12-12 (×3): qty 2

## 2022-12-12 NOTE — Progress Notes (Signed)
Physical Therapy Treatment Patient Details Name: Joe Hardy MRN: [id number] DOB: [birth year] Today's Date: 12/12/2022   History of Present Illness Patient is a 82 year old male with worsening lower extremity weakness since URI in early February. MRI showed Remote nonunited type 2 dens fracture. Edema and enhancement in the adjacent C2 vertebral body and right C1 and C2 lateral masses is favored degenerative/reactive in nature, with infection or acute injury considered less likely.Multifactorial severe spinal canal stenosis at L2-L3 and L4-L5  with compression of the cauda equina nerve roots, and moderate spinal canal stenosis at L3-L4Right subarticular zone effacement with impingement of the traversing right S1 nerve root, and severe right worse than left neural foraminal stenosis at L5-S1." PMH of cancer, GERD, depression, HLD, HTN.    PT Comments    Patient alert, agreeable and motivated for PT. Reported generalized neck and back pain during session, as well as anxiety about mobility, encouragement and active listening provided as needed. Overall he showed improvement in mobility today; minA for  bed mobility and minAx2 for safety for standing, step pivots, and minA with RW to ambulate 51ft in room. Pt remains shaky in bilateral Les and continuously expresses concern about their status. The patient would benefit from further skilled PT intervention to continue to progress towards goals.        Recommendations for follow up therapy are one component of a multi-disciplinary discharge planning process, led by the attending physician.  Recommendations may be updated based on patient status, additional functional criteria and insurance authorization.  Follow Up Recommendations  Can patient physically be transported by private vehicle: No    Assistance Recommended at Discharge Frequent or constant Supervision/Assistance  Patient can return home with the following A lot of help with walking and/or  transfers;Help with stairs or ramp for entrance;Assist for transportation;A little help with bathing/dressing/bathroom;A little help with walking and/or transfers;Assistance with cooking/housework   Equipment Recommendations  None recommended by PT    Recommendations for Other Services       Precautions / Restrictions Precautions Precautions: Fall Restrictions Weight Bearing Restrictions: No     Mobility  Bed Mobility Overal bed mobility: Needs Assistance Bed Mobility: Supine to Sit     Supine to sit: Min assist     General bed mobility comments: minA to reposition to midline at EOB    Transfers Overall transfer level: Needs assistance Equipment used: Rolling walker (2 wheels) Transfers: Sit to/from Stand Sit to Stand: Min assist, +2 safety/equipment           General transfer comment: performed twice, pt with ability to initiate and participate much more than last session    Ambulation/Gait Ambulation/Gait assistance: Min guard Gait Distance (Feet): 5 Feet Assistive device: Rolling walker (2 wheels)         General Gait Details: step by step cueing for gait mechanics and RW use   Stairs             Wheelchair Mobility    Modified Rankin (Stroke Patients Only)       Balance Overall balance assessment: Needs assistance Sitting-balance support: Feet supported, Bilateral upper extremity supported, Single extremity supported, No upper extremity supported Sitting balance-Leahy Scale: Fair     Standing balance support: Reliant on assistive device for balance, During functional activity, Bilateral upper extremity supported Standing balance-Leahy Scale: Poor  Cognition Arousal/Alertness: Awake/alert Behavior During Therapy: WFL for tasks assessed/performed Overall Cognitive Status: Within Functional Limits for tasks assessed                                 General Comments: patient does  report feeling intermittently anxious        Exercises      General Comments        Pertinent Vitals/Pain Pain Assessment Pain Assessment: Faces Faces Pain Scale: Hurts a little bit Pain Location: generalized low back and neck pain Pain Descriptors / Indicators: Aching, Grimacing, Guarding Pain Intervention(s): Limited activity within patient's tolerance, Monitored during session, Repositioned    Home Living                          Prior Function            PT Goals (current goals can now be found in the care plan section) Progress towards PT goals: Progressing toward goals    Frequency    Min 2X/week      PT Plan Current plan remains appropriate    Co-evaluation              AM-PAC PT "6 Clicks" Mobility   Outcome Measure  Help needed turning from your back to your side while in a flat bed without using bedrails?: A Little Help needed moving from lying on your back to sitting on the side of a flat bed without using bedrails?: A Little Help needed moving to and from a bed to a chair (including a wheelchair)?: A Little Help needed standing up from a chair using your arms (e.g., wheelchair or bedside chair)?: A Little Help needed to walk in hospital room?: A Lot Help needed climbing 3-5 steps with a railing? : Total 6 Click Score: 15    End of Session Equipment Utilized During Treatment: Gait belt Activity Tolerance: Patient tolerated treatment well Patient left: with chair alarm set;in chair;with call bell/phone within reach Nurse Communication: Mobility status PT Visit Diagnosis: Unsteadiness on feet (R26.81);Muscle weakness (generalized) (M62.81)     Time: [id number] PT Time Calculation (min) (ACUTE ONLY): 21 min  Charges:  $Therapeutic Activity: 8-22 mins                     Lieutenant Diego PT, DPT 3:18 PM,12/12/22

## 2022-12-12 NOTE — NC FL2 (Signed)
[place] LEVEL OF CARE FORM     IDENTIFICATION  Patient Name: Joe Hardy Birthdate: [birth year] Sex: male Admission Date (Current Location): 12/10/2022  [facility] and [place] Number:  Engineering geologist and Address:  [facility], [address] [place], [place] [address]      Provider Number: [id number]  Attending Physician Name and Address:  Enzo Bi, MD  Relative Name and Phone Number:  Conchita Paris    Current Level of Care: Hospital Recommended Level of Care: [place] Prior Approval Number:    Date Approved/Denied:   PASRR Number: [id number]  Discharge Plan: SNF    Current Diagnoses: Patient Active Problem List   Diagnosis Date Noted   Spinal stenosis 12/11/2022   Ambulatory dysfunction 12/10/2022   Lower extremity weakness 12/10/2022   Testicular pain, right 12/10/2022   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/07/2022   Iron deficiency anemia secondary to inadequate dietary iron intake 06/03/2022   Overweight with body mass index (BMI) of 26 to 26.9 in adult 03/09/2021   Constipation due to slow transit 05/02/2018   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   Depression, major, recurrent, in partial remission ([place]) 04/14/2015   Arthritis, degenerative 04/14/2015   Mixed hyperlipidemia 04/14/2015   Essential hypertension 04/14/2015   Hypothyroidism 04/14/2015   Insomnia 04/14/2015   Headache, migraine 04/14/2015    Orientation RESPIRATION BLADDER Height & Weight     Self, Situation, Time, Place  Normal Continent Weight: 68 kg Height:  5\' 9"  (175.3 cm)  BEHAVIORAL SYMPTOMS/MOOD NEUROLOGICAL BOWEL NUTRITION STATUS      Continent Diet  AMBULATORY STATUS COMMUNICATION OF NEEDS Skin   Limited Assist Verbally Normal                       Personal Care Assistance Level of Assistance  Bathing, Dressing Bathing Assistance: Limited assistance   Dressing Assistance: Limited  assistance     Functional Limitations Info             SPECIAL CARE FACTORS FREQUENCY  OT (By licensed OT), PT (By licensed PT)     PT Frequency: 5 times a week OT Frequency: 5 times a week            Contractures      Additional Factors Info  Code Status, Allergies Code Status Info: Full Allergies Info: Venlafaxine & Fluoxetine           Current Medications (12/12/2022):  This is the current hospital active medication list Current Facility-Administered Medications  Medication Dose Route Frequency Provider Last Rate Last Admin   acetaminophen (TYLENOL) tablet 650 mg  650 mg Oral Q6H PRN Deneise Lever, MD       baclofen (LIORESAL) tablet 5 mg  5 mg Oral BID Enzo Bi, MD       buPROPion (WELLBUTRIN XL) 24 hr tablet 450 mg  450 mg Oral Daily Deneise Lever, MD   450 mg at 12/12/22 0943   busPIRone (BUSPAR) tablet 10 mg  10 mg Oral BID Deneise Lever, MD   10 mg at 12/12/22 0943   docusate sodium (COLACE) capsule 200 mg  200 mg Oral BID PRN Deneise Lever, MD   200 mg at 12/11/22 0933   enoxaparin (LOVENOX) injection 40 mg  40 mg Subcutaneous Q24H Deneise Lever, MD   40 mg at 12/12/22 0943   levothyroxine (SYNTHROID) tablet 75 mcg  75 mcg Oral Q0600 Deneise Lever,  MD   75 mcg at 12/12/22 0457   magnesium citrate solution 1 Bottle  1 Bottle Oral Once Enzo Bi, MD       mirtazapine (REMERON) tablet 30 mg  30 mg Oral QHS Enzo Bi, MD       ondansetron [facility]) tablet 4 mg  4 mg Oral Q6H PRN Deneise Lever, MD       Or   ondansetron [facility]) injection 4 mg  4 mg Intravenous Q6H PRN Deneise Lever, MD       sulfamethoxazole-trimethoprim (BACTRIM DS) 800-160 MG per tablet 1 tablet  1 tablet Oral Q12H Deneise Lever, MD   1 tablet at 12/12/22 0943   traZODone (DESYREL) tablet 150 mg  150 mg Oral QHS Enzo Bi, MD         Discharge Medications: Please see discharge summary for a list of discharge medications.  Relevant Imaging Results:  Relevant Lab  Results:   Additional Information SSN = [id number]  Valente David, RN

## 2022-12-12 NOTE — TOC Initial Note (Signed)
Transition of Care [facility]) - Initial/Assessment Note    Patient Details  Name: Joe Hardy MRN: [id number] Date of Birth: Mar 12, 1941  Transition of [facility]) CM/SW Contact:    Valente David, RN Phone Number: 12/12/2022, 10:46 AM  Clinical Narrative:                  Patient admitted from home where he lives alone.  Webb Silversmith, NP is PCP, obtains medication from mail order through [place]. If he needs anything right away, he uses the local [facility].  Has a walker at home, no other DME.  Aware that recommendations for SNF for short term rehab, he agrees, does not have a preference.  PASRR obtained, FL2 completed, bed search initiated.    Expected Discharge Plan: Skilled Nursing Facility Barriers to Discharge: Continued Medical Work up   Patient Goals and CMS Choice Patient states their goals for this hospitalization and ongoing recovery are:: Rehab for PT/OT to get stronger CMS Medicare.gov Compare Post Acute Care list provided to:: Patient Choice offered to / list presented to : Patient      Expected Discharge Plan and Services     Post Acute Care Choice: [place] Living arrangements for the past 2 months: Apartment                                      Prior Living Arrangements/Services Living arrangements for the past 2 months: Apartment Lives with:: Self Patient language and need for interpreter reviewed:: Yes Do you feel safe going back to the place where you live?: Yes      Need for Family Participation in Patient Care: Yes (Comment) Care giver support system in place?: Yes (comment) Current home services: DME Criminal Activity/Legal Involvement Pertinent to Current Situation/Hospitalization: No - Comment as needed  Activities of Daily Living Home Assistive Devices/Equipment: Walker (specify type), Eyeglasses, Dentures (specify type) ADL Screening (condition at time of admission) Patient's cognitive ability adequate to safely complete  daily activities?: Yes Is the patient deaf or have difficulty hearing?: No Does the patient have difficulty seeing, even when wearing glasses/contacts?: No Does the patient have difficulty concentrating, remembering, or making decisions?: Yes Patient able to express need for assistance with ADLs?: Yes Does the patient have difficulty dressing or bathing?: Yes Independently performs ADLs?: No Communication: Independent Dressing (OT): Needs assistance Is this a change from baseline?: Change from baseline, expected to last <3days Grooming: Needs assistance Is this a change from baseline?: Change from baseline, expected to last <3 days Feeding: Independent Bathing: Needs assistance Is this a change from baseline?: Change from baseline, expected to last <3 days Toileting: Needs assistance Is this a change from baseline?: Change from baseline, expected to last <3 days In/Out Bed: Needs assistance Is this a change from baseline?: Change from baseline, expected to last <3 days Walks in Home: Needs assistance Is this a change from baseline?: Change from baseline, expected to last <3 days Does the patient have difficulty walking or climbing stairs?: Yes Weakness of Legs: Both Weakness of Arms/Hands: None  Permission Sought/Granted   Permission granted to share information with : Yes, Verbal Permission Granted     Permission granted to share info w AGENCY: SNF        Emotional Assessment   Attitude/Demeanor/Rapport: Engaged Affect (typically observed): Calm Orientation: : Oriented to Self, Oriented to Place, Oriented to Situation, Oriented to  Time  Psych Involvement: No (comment)  Admission diagnosis:  Xerostomia [K11.7] Bilateral leg weakness [R29.898] Lower extremity weakness [R29.898] Ambulatory dysfunction [R26.2] Spinal stenosis [M48.00] Patient Active Problem List   Diagnosis Date Noted   Spinal stenosis 12/11/2022   Ambulatory dysfunction 12/10/2022   Lower extremity  weakness 12/10/2022   Testicular pain, right 12/10/2022   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   Iron deficiency anemia secondary to inadequate dietary iron intake 06/03/2022   Overweight with body mass index (BMI) of 26 to 26.9 in adult 03/09/2021   Constipation due to slow transit 05/02/2018   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   Depression, major, recurrent, in partial remission ([place]) 04/14/2015   Arthritis, degenerative 04/14/2015   Mixed hyperlipidemia 04/14/2015   Essential hypertension 04/14/2015   Hypothyroidism 04/14/2015   Insomnia 04/14/2015   Headache, migraine 04/14/2015   PCP:  Jearld Fenton, NP Pharmacy:   [facility] ([place], [place] [facility] [address] [place] [address] [place] [address] Phone: [phone] Fax: [place], [address] [place] [address] Phone: [phone] Fax: [phone]     Social Determinants of Health (SDOH) Social History: SDOH Screenings   Food Insecurity: No Food Insecurity (12/10/2022)  Housing: Low Risk  (12/10/2022)  Transportation Needs: Unmet Transportation Needs (12/10/2022)  Utilities: Not At Risk (12/10/2022)  Alcohol Screen: Low Risk  (10/09/2021)  Depression (PHQ2-9): High Risk (12/07/2022)  Financial Resource Strain: Low Risk  (10/09/2021)  Physical Activity: Inactive (10/09/2021)  Social Connections: Moderately Isolated (10/09/2021)  Stress: No Stress Concern Present (10/09/2021)  Tobacco Use: Medium Risk (12/10/2022)   SDOH Interventions:     Readmission Risk Interventions     No data to display

## 2022-12-12 NOTE — Progress Notes (Signed)
  PROGRESS NOTE    Joe Hardy  [id number] DOB: January 10, 1941 DOA: 12/10/2022 PCP: Jearld Fenton, NP  222A/222A-AA  LOS: 1 day   Brief hospital course:   Assessment & Plan: Joe Hardy is a 82 y.o. male with medical history significant of hyperlipidemia, GERD, hypertension, iron deficiency anemia, bladder stones who came in due to severe mouth dryness and then found to have lower extremity weakness.   Lower extremity weakness Per neuro, "5/5 strength in all muscle groups (BLE and otherwise) except for R delt 2/2 old rotator cuff injury. Joe Hardy can be ruled out since he has reflexes throughout."  Pt has subjective weakness and imbalance likely from deconditioning. --PT/OT rec SNF rehab  Chronic spinal stenosis --pt had no complaint of pain.  Had no concerning symptoms.  Neurosurgery consulted, no acute surgical indications. 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   --Keep urology follow-up as scheduled in 2 months   Iron deficiency anemia secondary to inadequate dietary iron intake --Hgb 9's. 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 Diamantina Providence    DVT  prophylaxis: Lovenox SQ Code Status: Full code  Family Communication:  Level of care: Med-Surg Dispo:   The patient is from: home Anticipated d/c is to: SNF rehab Anticipated d/c date is: whenever bed available   Subjective and Interval History:  No complaint today.   Objective: Vitals:   12/11/22 1815 12/11/22 2025 12/12/22 0417 12/12/22 0752  BP: (!) 149/90 131/82 99/65 134/74  Pulse: 97 95 76 73  Resp: 18 20 20 16   Temp:  98.4 F (36.9 C) 97.9 F (36.6 C) 98.3 F (36.8 C)  TempSrc:  Oral Oral Axillary  SpO2: 99% 97% 97% 96%  Weight:      Height:        Intake/Output Summary (Last 24 hours) at 12/12/2022 1659 Last data filed at 12/12/2022 1444 Gross per 24 hour  Intake 1320 ml  Output 1401 ml  Net -81 ml   Filed Weights   12/10/22 0031  Weight: 68 kg    Examination:   Constitutional: NAD, AAOx3 HEENT: conjunctivae and lids normal, EOMI CV: No cyanosis.   RESP: normal respiratory effort, on RA Neuro: II - XII grossly intact.   Psych: Normal mood and affect.  Appropriate judgement and reason   Data Reviewed: I have personally reviewed labs and imaging studies  Time spent: 25 minutes  Enzo Bi, MD [facility] If 7PM-7AM, please contact night-coverage 12/12/2022, 4:59 PM

## 2022-12-12 NOTE — Consult Note (Signed)
Opened in error

## 2022-12-13 DIAGNOSIS — R262 Difficulty in walking, not elsewhere classified: Secondary | ICD-10-CM | POA: Diagnosis not present

## 2022-12-13 LAB — CBC
HCT: 29.7 % — ABNORMAL LOW (ref 39.0–52.0)
Hemoglobin: 9.6 g/dL — ABNORMAL LOW (ref 13.0–17.0)
MCH: 21.4 pg — ABNORMAL LOW (ref 26.0–34.0)
MCHC: 32.3 g/dL (ref 30.0–36.0)
MCV: 66.1 fL — ABNORMAL LOW (ref 80.0–100.0)
Platelets: 300 10*3/uL (ref 150–400)
RBC: 4.49 MIL/uL (ref 4.22–5.81)
RDW: 16.2 % — ABNORMAL HIGH (ref 11.5–15.5)
WBC: 8.8 10*3/uL (ref 4.0–10.5)
nRBC: 0 % (ref 0.0–0.2)

## 2022-12-13 LAB — BASIC METABOLIC PANEL
Anion gap: 7 (ref 5–15)
BUN: 23 mg/dL (ref 8–23)
CO2: 24 mmol/L (ref 22–32)
Calcium: 9.2 mg/dL (ref 8.9–10.3)
Chloride: 104 mmol/L (ref 98–111)
Creatinine, Ser: 0.78 mg/dL (ref 0.61–1.24)
GFR, Estimated: >60 mL/min (ref >60)
Glucose, Bld: 105 mg/dL — ABNORMAL HIGH (ref 70–99)
Potassium: 3.9 mmol/L (ref 3.5–5.1)
Sodium: 135 mmol/L (ref 135–145)

## 2022-12-13 LAB — MAGNESIUM: Magnesium: 2 mg/dL (ref 1.7–2.4)

## 2022-12-13 MED ORDER — POLYETHYLENE GLYCOL 3350 17 G PO PACK
17.0000 g | PACK | Freq: Every day | ORAL | Status: DC
  Administered 2022-12-14: 17 g via ORAL
  Filled 2022-12-13: qty 1

## 2022-12-13 NOTE — Progress Notes (Signed)
Occupational Therapy Treatment Patient Details Name: Joe Hardy MRN: [id number] DOB: 01-23-1941 Today's Date: 12/13/2022   History of present illness Patient is a 82 year old male with worsening lower extremity weakness since URI in early February. MRI showed Remote nonunited type 2 dens fracture. Edema and enhancement in the adjacent C2 vertebral body and right C1 and C2 lateral masses is favored degenerative/reactive in nature, with infection or acute injury considered less likely.Multifactorial severe spinal canal stenosis at L2-L3 and L4-L5  with compression of the cauda equina nerve roots, and moderate spinal canal stenosis at L3-L4Right subarticular zone effacement with impingement of the traversing right S1 nerve root, and severe right worse than left neural foraminal stenosis at L5-S1." PMH of cancer, GERD, depression, HLD, HTN.   OT comments  Upon entering the room, pt supine in bed with no c/o pain and agreeable to OT intervention. Pt performs bed mobility with min guard and cuing for technique and hand placement. Pt has some B LE jerking when initially coming to EOB which is likely from anxiety as it quickly dissipates and not seen again. Dynamic sitting balance with supervision overall.  Pt able to slide feet into shoes with assistance from therapist with heel and tying B laces. Pt transitions to session with PT for mobility efforts. Pt making excellent progress this session. Continue per OT POC.    Recommendations for follow up therapy are one component of a multi-disciplinary discharge planning process, led by the attending physician.  Recommendations may be updated based on patient status, additional functional criteria and insurance authorization.    Assistance Recommended at Discharge Intermittent Supervision/Assistance  Patient can return home with the following  Assist for transportation;Help with stairs or ramp for entrance;Assistance with cooking/housework;A little help with  walking and/or transfers;A lot of help with bathing/dressing/bathroom   Equipment Recommendations  BSC/3in1       Precautions / Restrictions Precautions Precautions: Fall       Mobility Bed Mobility Overal bed mobility: Needs Assistance Bed Mobility: Supine to Sit     Supine to sit: Min guard          Transfers Overall transfer level: Needs assistance Equipment used: Rolling walker (2 wheels) Transfers: Sit to/from Stand Sit to Stand: Min guard                 Balance Overall balance assessment: Needs assistance Sitting-balance support: Feet supported, Single extremity supported Sitting balance-Leahy Scale: Good     Standing balance support: Reliant on assistive device for balance, During functional activity, Bilateral upper extremity supported Standing balance-Leahy Scale: Fair                             ADL either performed or assessed with clinical judgement   ADL Overall ADL's : Needs assistance/impaired     Grooming: Wash/dry hands;Wash/dry face;Sitting;Set up               Lower Body Dressing: Moderate assistance;Sitting/lateral leans Lower Body Dressing Details (indicate cue type and reason): to don and tie B shoes while seated on EOB                     Vision Patient Visual Report: No change from baseline            Cognition Arousal/Alertness: Awake/alert Behavior During Therapy: WFL for tasks assessed/performed Overall Cognitive Status: Within Functional Limits for tasks assessed  General Comments: Some anxiousness noted but pt is very pleasant and agreeable to OT intervention.                   Pertinent Vitals/ Pain       Pain Assessment Pain Assessment: Faces Faces Pain Scale: Hurts a little bit Pain Location: generalized low back and neck pain Pain Descriptors / Indicators: Discomfort Pain Intervention(s): Monitored during session, Repositioned          Frequency  Min 3X/week        Progress Toward Goals  OT Goals(current goals can now be found in the care plan section)  Progress towards OT goals: Progressing toward goals     Plan Discharge plan remains appropriate;Frequency needs to be updated       AM-PAC OT "6 Clicks" Daily Activity     Outcome Measure   Help from another person eating meals?: None Help from another person taking care of personal grooming?: None Help from another person toileting, which includes using toliet, bedpan, or urinal?: A Little Help from another person bathing (including washing, rinsing, drying)?: A Little Help from another person to put on and taking off regular upper body clothing?: None Help from another person to put on and taking off regular lower body clothing?: A Little 6 Click Score: 21    End of Session Equipment Utilized During Treatment: Rolling walker (2 wheels)  OT Visit Diagnosis: Other abnormalities of gait and mobility (R26.89);Muscle weakness (generalized) (M62.81)   Activity Tolerance Patient tolerated treatment well   Patient Left in chair;with chair alarm set;with call bell/phone within reach   Nurse Communication Mobility status        Time: 1131-1141 OT Time Calculation (min): 10 min  Charges: OT General Charges $OT Visit: 1 Visit OT Treatments $Self Care/Home Management : 8-22 mins  Darleen Crocker, MS, OTR/L , CBIS ascom [phone]  12/13/22, 1:54 PM

## 2022-12-13 NOTE — Progress Notes (Signed)
Physical Therapy Treatment Patient Details Name: Joe Hardy MRN: [id number] DOB: February 17, 1941 Today's Date: 12/13/2022   History of Present Illness Patient is a 82 year old male with worsening lower extremity weakness since URI in early February. MRI showed Remote nonunited type 2 dens fracture. Edema and enhancement in the adjacent C2 vertebral body and right C1 and C2 lateral masses is favored degenerative/reactive in nature, with infection or acute injury considered less likely.Multifactorial severe spinal canal stenosis at L2-L3 and L4-L5  with compression of the cauda equina nerve roots, and moderate spinal canal stenosis at L3-L4Right subarticular zone effacement with impingement of the traversing right S1 nerve root, and severe right worse than left neural foraminal stenosis at L5-S1." PMH of cancer, GERD, depression, HLD, HTN.    PT Comments    Pt received sitting EOB with OT handoff.  Pt performed STS with good technique and was able to ambulate around the nursing station before coming back to the room and sitting in recliner.  Pt required some verbal cuing for staying within the walker and more upright posture and pt with good carryover.  Pt is making significant progress and may have the potential to be upgraded in discharge recommendations.  Pt therapy frequency adjusted to account for improvement made by pt.  Pt left with all needs met and call bell within reach.      Recommendations for follow up therapy are one component of a multi-disciplinary discharge planning process, led by the attending physician.  Recommendations may be updated based on patient status, additional functional criteria and insurance authorization.  Follow Up Recommendations  Can patient physically be transported by private vehicle: Yes    Assistance Recommended at Discharge Frequent or constant Supervision/Assistance  Patient can return home with the following A lot of help with walking and/or transfers;Help  with stairs or ramp for entrance;Assist for transportation;A little help with bathing/dressing/bathroom;A little help with walking and/or transfers;Assistance with cooking/housework   Equipment Recommendations  None recommended by PT    Recommendations for Other Services       Precautions / Restrictions Precautions Precautions: Fall     Mobility  Bed Mobility Overal bed mobility: Needs Assistance Bed Mobility: Supine to Sit     Supine to sit: Min guard          Transfers Overall transfer level: Needs assistance Equipment used: Rolling walker (2 wheels) Transfers: Sit to/from Stand Sit to Stand: Min guard                Ambulation/Gait Ambulation/Gait assistance: Min guard Gait Distance (Feet): 160 Feet Assistive device: Rolling walker (2 wheels) Gait Pattern/deviations: WFL(Within Functional Limits), Step-through pattern Gait velocity: decreased     General Gait Details: Pt with much more stability during ambulation attempt today.   Stairs             Wheelchair Mobility    Modified Rankin (Stroke Patients Only)       Balance Overall balance assessment: Needs assistance Sitting-balance support: Feet supported, Single extremity supported Sitting balance-Leahy Scale: Good     Standing balance support: Reliant on assistive device for balance, During functional activity, Bilateral upper extremity supported Standing balance-Leahy Scale: Fair                              Cognition Arousal/Alertness: Awake/alert Behavior During Therapy: WFL for tasks assessed/performed Overall Cognitive Status: Within Functional Limits for tasks assessed  General Comments: Some anxiousness noted but pt is very pleasant and agreeable throughout PT intervention.        Exercises      General Comments        Pertinent Vitals/Pain Pain Assessment Pain Assessment: Faces Faces Pain Scale: Hurts a  little bit Pain Location: generalized low back and neck pain Pain Descriptors / Indicators: Discomfort Pain Intervention(s): Monitored during session, Repositioned    Home Living                          Prior Function            PT Goals (current goals can now be found in the care plan section) Acute Rehab PT Goals Patient Stated Goal: to get better PT Goal Formulation: With patient Time For Goal Achievement: 12/24/22 Potential to Achieve Goals: Good Progress towards PT goals: Progressing toward goals    Frequency    Min 4X/week      PT Plan Frequency needs to be updated (Increasing frequency due to pt's progress and may be able to transition to HHPT?)    Co-evaluation              AM-PAC PT "6 Clicks" Mobility   Outcome Measure  Help needed turning from your back to your side while in a flat bed without using bedrails?: A Little Help needed moving from lying on your back to sitting on the side of a flat bed without using bedrails?: A Little Help needed moving to and from a bed to a chair (including a wheelchair)?: A Little Help needed standing up from a chair using your arms (e.g., wheelchair or bedside chair)?: A Little Help needed to walk in hospital room?: A Little Help needed climbing 3-5 steps with a railing? : A Lot 6 Click Score: 17    End of Session Equipment Utilized During Treatment: Gait belt Activity Tolerance: Patient tolerated treatment well Patient left: in chair;with call bell/phone within reach Nurse Communication: Mobility status PT Visit Diagnosis: Unsteadiness on feet (R26.81);Muscle weakness (generalized) (M62.81)     Time: [id number] PT Time Calculation (min) (ACUTE ONLY): 10 min  Charges:  $Gait Training: 8-22 mins                     Gwenlyn Saran, PT, DPT Physical Therapist - [facility]  12/13/22, 2:19 PM

## 2022-12-13 NOTE — TOC Progression Note (Signed)
Transition of Care [facility]) - Progression Note    Patient Details  Name: Ladislav Glotfelty MRN: [id number] Date of Birth: [birth year]  Transition of Care [facility]) CM/SW Contact  Beverly Sessions, RN Phone Number: 12/13/2022, 3:49 PM  Clinical Narrative:     Bed offer presented to patient He accepts offer at [facility].  Auth started in [facility] portal Accepted in hub notified michelle at Butch Penny MD   Expected Discharge Plan: [place] Barriers to Discharge: Continued Medical Work up  Expected Discharge Plan and [place] Choice: [place] arrangements for the past 2 months: Apartment                                       Social Determinants of Health (SDOH) Interventions SDOH Screenings   Food Insecurity: No Food Insecurity (12/10/2022)  Housing: Low Risk  (12/10/2022)  Transportation Needs: Unmet Transportation Needs (12/10/2022)  Utilities: Not At Risk (12/10/2022)  Alcohol Screen: Low Risk  (10/09/2021)  Depression (PHQ2-9): High Risk (12/07/2022)  Financial Resource Strain: Low Risk  (10/09/2021)  Physical Activity: Inactive (10/09/2021)  Social Connections: Moderately Isolated (10/09/2021)  Stress: No Stress Concern Present (10/09/2021)  Tobacco Use: Medium Risk (12/10/2022)    Readmission Risk Interventions     No data to display

## 2022-12-13 NOTE — Progress Notes (Signed)
  PROGRESS NOTE    Joe Hardy  [id number] DOB: October 13, 1940 DOA: 12/10/2022 PCP: Jearld Fenton, NP  [id number]  LOS: 2 days   Brief hospital course:   Assessment & Plan: Joe Hardy is a 82 y.o. male with medical history significant of hyperlipidemia, GERD, hypertension, iron deficiency anemia, bladder stones who came in due to severe mouth dryness and then found to have lower extremity weakness.   Lower extremity weakness Per neuro, "5/5 strength in all muscle groups (BLE and otherwise) except for R delt 2/2 old rotator cuff injury. [name] can be ruled out since he has reflexes throughout."  Pt has subjective weakness and imbalance likely from deconditioning. --PT/OT rec SNF rehab  Chronic spinal stenosis --pt had no complaint of pain.  Had no concerning symptoms.  Neurosurgery consulted, no acute surgical indications. 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   --Keep urology follow-up as scheduled in 2 months   Iron deficiency anemia secondary to inadequate dietary iron intake --Hgb 9's. 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 Diamantina Providence    Constipation --relieved with aggressive Miralax --schedule Miralax daily   DVT prophylaxis: Lovenox SQ Code Status: Full code  Family Communication:  Level of care: Med-Surg Dispo:   The patient is from: home Anticipated d/c is to: SNF rehab Anticipated d/c date is: whenever bed available   Subjective and Interval History:  Pt reported feeling a lot better after having large BM and relief from constipation.   Objective: Vitals:   12/13/22 0411 12/13/22 0755 12/13/22 1617 12/13/22 2001  BP: 100/62 109/76 115/70 129/82  Pulse: 74 91 84 92  Resp: 20 18 20 20   Temp: 98 F (36.7 C) 97.8 F (36.6 C) 98.4 F (36.9 C) 98.5 F (36.9 C)  TempSrc: Oral   Oral  SpO2: 95% 98% 99% 99%  Weight:      Height:        Intake/Output Summary (Last 24 hours) at 12/13/2022 2143 Last data filed at 12/13/2022 1300 Gross per 24 hour  Intake 600 ml  Output 1400 ml  Net -800 ml   Filed Weights   12/10/22 0031  Weight: 68 kg    Examination:   Constitutional: NAD, AAOx3, sitting in recliner HEENT: conjunctivae and lids normal, EOMI CV: No cyanosis.   RESP: normal respiratory effort, on RA Neuro: II - XII grossly intact.   Psych: Normal mood and affect.  Appropriate judgement and reason   Data Reviewed: I have personally reviewed labs and imaging studies  Time spent: 25 minutes  Enzo Bi, MD [facility] If 7PM-7AM, please contact night-coverage 12/13/2022, 9:43 PM

## 2022-12-14 ENCOUNTER — Telehealth: Payer: Self-pay

## 2022-12-14 DIAGNOSIS — C679 Malignant neoplasm of bladder, unspecified: Secondary | ICD-10-CM | POA: Diagnosis not present

## 2022-12-14 DIAGNOSIS — N451 Epididymitis: Secondary | ICD-10-CM | POA: Diagnosis not present

## 2022-12-14 DIAGNOSIS — Z79890 Hormone replacement therapy (postmenopausal): Secondary | ICD-10-CM | POA: Diagnosis not present

## 2022-12-14 DIAGNOSIS — Z87891 Personal history of nicotine dependence: Secondary | ICD-10-CM | POA: Diagnosis not present

## 2022-12-14 DIAGNOSIS — M6259 Muscle wasting and atrophy, not elsewhere classified, multiple sites: Secondary | ICD-10-CM | POA: Diagnosis not present

## 2022-12-14 DIAGNOSIS — R591 Generalized enlarged lymph nodes: Secondary | ICD-10-CM | POA: Diagnosis not present

## 2022-12-14 DIAGNOSIS — M4800 Spinal stenosis, site unspecified: Secondary | ICD-10-CM | POA: Diagnosis not present

## 2022-12-14 DIAGNOSIS — M47816 Spondylosis without myelopathy or radiculopathy, lumbar region: Secondary | ICD-10-CM | POA: Diagnosis not present

## 2022-12-14 DIAGNOSIS — K5909 Other constipation: Secondary | ICD-10-CM | POA: Diagnosis not present

## 2022-12-14 DIAGNOSIS — E039 Hypothyroidism, unspecified: Secondary | ICD-10-CM | POA: Diagnosis not present

## 2022-12-14 DIAGNOSIS — C689 Malignant neoplasm of urinary organ, unspecified: Secondary | ICD-10-CM | POA: Diagnosis not present

## 2022-12-14 DIAGNOSIS — R262 Difficulty in walking, not elsewhere classified: Secondary | ICD-10-CM | POA: Diagnosis not present

## 2022-12-14 DIAGNOSIS — D509 Iron deficiency anemia, unspecified: Secondary | ICD-10-CM | POA: Diagnosis not present

## 2022-12-14 DIAGNOSIS — R279 Unspecified lack of coordination: Secondary | ICD-10-CM | POA: Diagnosis not present

## 2022-12-14 DIAGNOSIS — M6281 Muscle weakness (generalized): Secondary | ICD-10-CM | POA: Diagnosis not present

## 2022-12-14 DIAGNOSIS — D508 Other iron deficiency anemias: Secondary | ICD-10-CM | POA: Diagnosis not present

## 2022-12-14 DIAGNOSIS — M48061 Spinal stenosis, lumbar region without neurogenic claudication: Secondary | ICD-10-CM | POA: Diagnosis not present

## 2022-12-14 DIAGNOSIS — J439 Emphysema, unspecified: Secondary | ICD-10-CM | POA: Diagnosis not present

## 2022-12-14 DIAGNOSIS — R29898 Other symptoms and signs involving the musculoskeletal system: Secondary | ICD-10-CM | POA: Diagnosis not present

## 2022-12-14 DIAGNOSIS — M1909 Primary osteoarthritis, other specified site: Secondary | ICD-10-CM | POA: Diagnosis not present

## 2022-12-14 DIAGNOSIS — I10 Essential (primary) hypertension: Secondary | ICD-10-CM | POA: Diagnosis not present

## 2022-12-14 DIAGNOSIS — R2681 Unsteadiness on feet: Secondary | ICD-10-CM | POA: Diagnosis not present

## 2022-12-14 DIAGNOSIS — E038 Other specified hypothyroidism: Secondary | ICD-10-CM | POA: Diagnosis not present

## 2022-12-14 MED ORDER — SULFAMETHOXAZOLE-TRIMETHOPRIM 800-160 MG PO TABS: 1.0000 | ORAL_TABLET | Freq: Two times a day (BID) | ORAL | 0 refills | Status: AC

## 2022-12-14 MED ORDER — IRON (FERROUS SULFATE) 325 (65 FE) MG PO TABS: 1.0000 | ORAL_TABLET | Freq: Every day | ORAL | Status: AC

## 2022-12-14 MED ORDER — POLYETHYLENE GLYCOL 3350 17 G PO PACK: 17.0000 g | PACK | Freq: Every day | ORAL | 0 refills | Status: DC

## 2022-12-14 NOTE — TOC Transition Note (Signed)
Transition of Care [facility]) - CM/SW Discharge Note   Patient Details  Name: Joe Hardy MRN: [id number] Date of Birth: 1941-03-10  Transition of Care [facility]) CM/SW Contact:  Beverly Sessions, RN Phone Number: 12/14/2022, 2:07 PM   Clinical Narrative:     Patient will [facility] to: [facility] Anticipated DC date: 12/14/22  Family notified: Patient to notify friend Transport [id number]  Per MD patient ready for DC to . RN, patient, and facility notified of DC. Discharge Summary sent to facility. RN given number for report. DC packet on chart. Ambulance transport requested for patient.  TOC signing off.  Isaias Cowman [facility] [phone]     Barriers to Discharge: Continued Medical Work up   Patient Goals and CMS Choice CMS Medicare.gov Compare Post Acute Care list provided to:: Patient Choice offered to / list presented to : Patient  Discharge Placement                         Discharge Plan and Services Additional resources added to the After Visit Summary for       Post Acute Care Choice: [place]                               Social Determinants of Health (SDOH) Interventions SDOH Screenings   Food Insecurity: No Food Insecurity (12/10/2022)  Housing: [place]  (12/10/2022)  Transportation Needs: Unmet Transportation Needs (12/10/2022)  Utilities: Not At Risk (12/10/2022)  Alcohol Screen: Low Risk  (10/09/2021)  Depression (PHQ2-9): High Risk (12/07/2022)  Financial Resource Strain: Low Risk  (10/09/2021)  Physical Activity: Inactive (10/09/2021)  Social Connections: Moderately Isolated (10/09/2021)  Stress: No Stress Concern Present (10/09/2021)  Tobacco Use: Medium Risk (12/10/2022)     Readmission Risk Interventions     No data to display

## 2022-12-14 NOTE — Telephone Encounter (Signed)
Dr Dava Najjar saw this patient as a consult on 12/11/22 for lower extremity weakness and incoordination.  Dr Dava Najjar recommended outpatient follow up. Danielle reviewed and recommended a new patient appointment with her or Marzetta Board in the next 1-2 weeks.

## 2022-12-14 NOTE — Progress Notes (Signed)
Physical Therapy Treatment Patient Details Name: Joe Hardy MRN: [id number] DOB: 09/27/1940 Today's Date: 12/14/2022   History of Present Illness Patient is a 82 year old male with worsening lower extremity weakness since URI in early February. MRI showed Remote nonunited type 2 dens fracture. Edema and enhancement in the adjacent C2 vertebral body and right C1 and C2 lateral masses is favored degenerative/reactive in nature, with infection or acute injury considered less likely.Multifactorial severe spinal canal stenosis at L2-L3 and L4-L5  with compression of the cauda equina nerve roots, and moderate spinal canal stenosis at L3-L4Right subarticular zone effacement with impingement of the traversing right S1 nerve root, and severe right worse than left neural foraminal stenosis at L5-S1." PMH of cancer, GERD, depression, HLD, HTN.    PT Comments    Pt was long sitting in bed upon arrival. He is A and O x 3. Agreeable to session and cooperative throughout. Does endorse knee and low back pain however able to fully participate. Pt does have severe balance deficits in which makes him a high fall risk. Highly recommend use of RW at all times. Pt will benefit from continued skilled PT going forward to maximize his independence and safety with all ADLs.     Recommendations for follow up therapy are one component of a multi-disciplinary discharge planning process, led by the attending physician.  Recommendations may be updated based on patient status, additional functional criteria and insurance authorization.     Assistance Recommended at Discharge Frequent or constant Supervision/Assistance  Patient can return home with the following A little help with walking and/or transfers;A lot of help with bathing/dressing/bathroom;Assistance with cooking/housework;Direct supervision/assist for medications management;Assist for transportation;Help with stairs or ramp for entrance   Equipment Recommendations   Other (comment) (defer to next level of care)       Precautions / Restrictions Precautions Precautions: Fall Restrictions Weight Bearing Restrictions: No     Mobility  Bed Mobility Overal bed mobility: Needs Assistance Bed Mobility: Supine to Sit  Supine to sit: Min guard  General bed mobility comments: Increased time required, HOB elevated, Heavy use of bed functions.    Transfers Overall transfer level: Needs assistance Equipment used: Rolling walker (2 wheels) Transfers: Sit to/from Stand Sit to Stand: Min guard, Min assist  General transfer comment: pt performed, STS 4 x throughout session. 2 episodes of LOB upon standing. posteriorly. High fall risk.    Ambulation/Gait Ambulation/Gait assistance: Min guard Gait Distance (Feet): 200 Feet Assistive device: Rolling walker (2 wheels) Gait Pattern/deviations: Trunk flexed, Narrow base of support, Step-through pattern Gait velocity: decreased General Gait Details: Pt was able to ambulate ~ 200 ft with RW however has narrow BOS with flexed posture. Highly recommend continued BUE support during all standing/ ambulation/ dynamic activity   Balance Overall balance assessment: Needs assistance Sitting-balance support: Feet supported, Single extremity supported Sitting balance-Leahy Scale: Good     Standing balance support: Reliant on assistive device for balance, During functional activity, Bilateral upper extremity supported Standing balance-Leahy Scale: Fair Standing balance comment: Reliant on RW. Pt is high fall risk       Cognition Arousal/Alertness: Awake/alert Behavior During Therapy: WFL for tasks assessed/performed Overall Cognitive Status: Within Functional Limits for tasks assessed    General Comments: Pt is A and oriented x 3               Pertinent Vitals/Pain Pain Assessment Pain Assessment: 0-10 Pain Score: 5  Faces Pain Scale: Hurts little more Pain Location: generalized low  back and R knee  pain Pain Descriptors / Indicators: Discomfort Pain Intervention(s): Limited activity within patient's tolerance, Monitored during session, Premedicated before session, Repositioned     PT Goals (current goals can now be found in the care plan section) Acute Rehab PT Goals Patient Stated Goal: to get better Progress towards PT goals: Progressing toward goals    Frequency    Min 4X/week      PT Plan Frequency needs to be updated;Current plan remains appropriate       AM-PAC PT "6 Clicks" Mobility   Outcome Measure  Help needed turning from your back to your side while in a flat bed without using bedrails?: A Little Help needed moving from lying on your back to sitting on the side of a flat bed without using bedrails?: A Little Help needed moving to and from a bed to a chair (including a wheelchair)?: A Little Help needed standing up from a chair using your arms (e.g., wheelchair or bedside chair)?: A Little Help needed to walk in hospital room?: A Little Help needed climbing 3-5 steps with a railing? : A Lot 6 Click Score: 17    End of Session Equipment Utilized During Treatment: Gait belt Activity Tolerance: Patient tolerated treatment well Patient left: in chair;with call bell/phone within reach Nurse Communication: Mobility status PT Visit Diagnosis: Unsteadiness on feet (R26.81);Muscle weakness (generalized) (M62.81)     Time: [id number] PT Time Calculation (min) (ACUTE ONLY): 25 min  Charges:  $Gait Training: 8-22 mins $Therapeutic Activity: 8-22 mins                    Julaine Fusi PTA 12/14/22, 9:54 AM

## 2022-12-14 NOTE — Telephone Encounter (Signed)
Patient is still inpatient. Will  call patient back.

## 2022-12-14 NOTE — Discharge Summary (Signed)
Physician Discharge Summary   Joe Hardy  male DOB: 05-30-1941  [id number]  PCP: Jearld Fenton, NP  Admit date: 12/10/2022 Discharge date: 12/14/2022  Admitted From: home Disposition:  SNF rehab CODE STATUS: Full code  Discharge Instructions     Diet general   Complete by: As directed       Hospital Course:  For full details, please see H&P, progress notes, consult notes and ancillary notes.  Briefly,  Joe Hardy is a 82 y.o. male with medical history significant of hypertension, iron deficiency anemia, bladder stones who came in due to severe mouth dryness and then found to have lower extremity weakness.   Lower extremity weakness Per neuro, "5/5 strength in all muscle groups (BLE and otherwise) except for R delt 2/2 old rotator cuff injury. Joe Hardy can be ruled out since he has reflexes throughout."  Pt has subjective weakness and imbalance likely from deconditioning. --PT/OT rec SNF rehab   Chronic spinal stenosis --pt had no complaint of pain.  Had no concerning symptoms.  Neurosurgery consulted, no acute surgical indications. --aggressive PT/OT --Outpatient follow up with neurosurgery for lumbar stenosis in a few weeks    Right-sided epididymitis  Noted right testicular pain from outpatient evaluation on 3/26. Started on Bactrim per urology recommendations --Urology consulted, his exam is benign with no evidence of abscess  --cont Bactrim for 14 day course, per urology, end date 12/21/22.   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   --Keep urology follow-up as scheduled in 2 months    Hx of Iron deficiency anemia  --Hgb 9's. --no iron supplement on home med list.  Discharge on iron supplement.   Hypothyroidism Cont synthroid    Essential hypertension, not currently active --BP has bee normal during  hospitalization without home BP medication. --d/c home HCTZ/Lisinopril   Mixed hyperlipidemia --resume statin after discharge   Benign prostatic hyperplasia with urinary obstruction S/p cystolitholapaxy and HOLEP (holmium laser enucleation of the prostate ) on 11/26/2022 for urinary retention and bladder stones w/ Dr. Diamantina Providence    Constipation --relieved with aggressive Miralax --Miralax daily scheduled   Unless noted above, medications under "STOP" list are ones pt was not taking PTA.  Discharge Diagnoses:  Principal Problem:   Ambulatory dysfunction Active Problems:   Lower extremity weakness   Benign prostatic hyperplasia with urinary obstruction   Arthritis, degenerative   Mixed hyperlipidemia   Essential hypertension   Hypothyroidism   Iron deficiency anemia secondary to inadequate dietary iron intake   Urothelial carcinoma   Testicular pain, right   Spinal stenosis   30 Day Unplanned Readmission Risk Score    Flowsheet Row ED to Hosp-Admission (Current) from 12/10/2022 in [place]  30 Day Unplanned Readmission Risk Score (%) 20.66 Filed at 12/14/2022 1200       This score is the patient's risk of an unplanned readmission within 30 days of being discharged (0 -100%). The score is based on dignosis, age, lab data, medications, orders, and past utilization.   Low:  0-14.9   Medium: 15-21.9   High: 22-29.9   Extreme: 30 and above         Discharge Instructions:  Allergies as of 12/14/2022       Reactions   Fluoxetine    insomnia   Venlafaxine    difficulty sleeping  Medication List     STOP taking these medications    FLAX SEEDS PO   lisinopril-hydrochlorothiazide 10-12.5 MG tablet Commonly known as: ZESTORETIC   multivitamin with minerals Tabs tablet   traMADol 50 MG tablet Commonly known as: Ultram   TURMERIC PO       TAKE these medications    atorvastatin 20 MG tablet Commonly known as:  LIPITOR TAKE 1 TABLET BY MOUTH  DAILY AT 6PM What changed: See the new instructions.   b complex vitamins tablet Take 1 tablet by mouth daily.   baclofen 10 MG tablet Commonly known as: LIORESAL Take 5 mg by mouth 2 (two) times daily.   buPROPion 150 MG 24 hr tablet Commonly known as: WELLBUTRIN XL Take 450 mg by mouth daily.   busPIRone 10 MG tablet Commonly known as: BUSPAR Take 1 tablet (10 mg total) by mouth 2 (two) times daily.   cholecalciferol 25 MCG (1000 UNIT) tablet Commonly known as: VITAMIN D3 Take 4,000 Units by mouth daily.   cyanocobalamin 1000 MCG tablet Take 1,000 mcg by mouth daily.   GARLIC PO Take 2 tablets by mouth daily.   Iron (Ferrous Sulfate) 325 (65 Fe) MG Tabs Take 1 tablet by mouth daily.   levothyroxine 75 MCG tablet Commonly known as: SYNTHROID TAKE 1 TABLET BY MOUTH DAILY  BEFORE BREAKFAST   magnesium 30 MG tablet Take 1 tablet (30 mg total) by mouth daily.   mirtazapine 30 MG tablet Commonly known as: REMERON Take 30 mg by mouth at bedtime.   NEURIVA PLUS PO Take 1 capsule by mouth daily in the afternoon.   polyethylene glycol 17 g packet Commonly known as: MIRALAX / GLYCOLAX Take 17 g by mouth daily. Start taking on: December 15, 2022   Saw Palmetto 450 MG Caps Take 450 mg by mouth daily.   senna-docusate 8.6-50 MG tablet Commonly known as: Senokot-S Take 1 tablet by mouth daily.   SM Wrist Cuff BP Monitor Misc 1 Units by Does not apply route 2 (two) times a week.   sulfamethoxazole-trimethoprim 800-160 MG tablet Commonly known as: BACTRIM DS Take 1 tablet by mouth 2 (two) times daily for 7 days.   traZODone 150 MG tablet Commonly known as: DESYREL Take 150 mg by mouth at bedtime.   vitamin C 1000 MG tablet Take 1,000 mg by mouth daily.         Contact information for follow-up providers     Jearld Fenton, NP .   Specialties: Internal Medicine, Emergency Medicine Why: call for next available  appointment Contact information: [place] [address] [phone]         Blanche East, MD Follow up in 1 month(s).   Specialty: Neurosurgery Contact information: [place] [address] [phone]         Billey Co, MD Follow up in 2 month(s).   Specialty: Urology Contact information: [place] [address] [phone]              Contact information for after-discharge care     [facility] .   Service: Skilled Nursing Contact information: [place] [address] [phone]                     Allergies  Allergen Reactions   Fluoxetine     insomnia   Venlafaxine  difficulty sleeping     The results of significant diagnostics from this hospitalization (including imaging, microbiology, ancillary and laboratory) are listed below for reference.   Consultations:   Procedures/Studies: MR BRAIN W WO CONTRAST  Result Date: 12/10/2022 CLINICAL DATA:  Worsening lower extremity weakness with relative ataxia following URI in February. EXAM: MRI HEAD WITHOUT AND WITH CONTRAST TECHNIQUE: Multiplanar, multiecho pulse sequences of the brain and surrounding structures were obtained without and with intravenous contrast. CONTRAST:  45mL GADAVIST GADOBUTROL 1 MMOL/ML IV SOLN COMPARISON:  CT head obtained earlier the same day FINDINGS: Brain: There is no acute intracranial hemorrhage, extra-axial fluid collection, or acute infarct There is background parenchymal volume loss with prominence of the ventricular system and extra-axial CSF spaces. The brainstem is normal in morphology. There are scattered small foci of FLAIR signal abnormality in the supratentorial white matter likely reflecting mild for age chronic small-vessel ischemic change. There is no acute or suspicious parenchymal signal abnormality.  The pituitary and suprasellar region are normal. There is no mass lesion or abnormal enhancement. There is no mass effect or midline shift. Vascular: Normal flow voids. Skull and upper cervical spine: There is no suspicious calvarial signal abnormality. The cervical spine is assessed on the separately dictated spine MRI. Sinuses/Orbits: The paranasal sinuses are clear. Bilateral lens implants are in place. The globes and orbits are otherwise unremarkable. Other: None. IMPRESSION: No acute intracranial pathology to explain the patient's symptoms. Electronically Signed   By: Valetta Mole M.D.   On: 12/10/2022 13:10   MR CERVICAL SPINE W WO CONTRAST  Result Date: 12/10/2022 CLINICAL DATA:  Worsening lower extremity weakness now with relative ataxia since upper respiratory infection in February. 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 EXAM: MRI CERVICAL, THORACIC AND LUMBAR SPINE WITHOUT AND WITH CONTRAST TECHNIQUE: Multiplanar and multiecho pulse sequences of the cervical spine, to include the craniocervical junction and cervicothoracic junction, and thoracic and lumbar spine, were obtained without and with intravenous contrast. CONTRAST:  3mL GADAVIST GADOBUTROL 1 MMOL/ML IV SOLN COMPARISON:  CT abdomen/pelvis 11/09/2022, cervical spine CT and lumbar radiographs 01/10/2015 FINDINGS: MRI CERVICAL SPINE FINDINGS Alignment: There is unchanged mild anterolisthesis of C5 on C6. There is straightening of the normal cervical lordosis. Vertebrae: A type 2 dens fracture is again seen, similar in alignment to the study from 2016 consistent with chronic fracture. Edema in the adjacent C2 vertebral body and right C1 and C2 lateral masses is favored degenerative in nature. Otherwise, vertebral body heights are preserved. There is no evidence of new acute fracture. There is mild degenerative endplate edema at D34-534 and C7-T1 Cord: Normal in signal and  morphology. There is no abnormal enhancement. Posterior Fossa, vertebral arteries, paraspinal tissues: The posterior fossa is assessed on the separately dictated brain MRI. The vertebral artery flow voids are normal. The paraspinal soft tissues are unremarkable. Disc levels: C2-C3: Left worse than right facet arthropathy without significant spinal canal or neural foraminal stenosis C3-C4: There is mild endplate spurring and facet arthropathy resulting mild right and no significant left neural foraminal stenosis and no significant spinal canal stenosis C4-C5: There is left worse than right endplate spurring and facet arthropathy resulting in severe left and mild right neural foraminal stenosis without significant spinal canal stenosis C5-C6: There is anterolisthesis with uncovering of the disc posteriorly and associated uncovertebral and facet arthropathy, worse on the right, but no significant spinal canal or neural foraminal stenosis C6-C7: Mild endplate spurring and facet arthropathy without significant  spinal canal or neural foraminal stenosis C7-T1: No significant spinal canal or neural foraminal stenosis. MRI THORACIC SPINE FINDINGS Alignment:  Normal. Vertebrae: There is mild compression deformity of the T1 vertebral body with proximally 10% loss of vertebral body height. There is mild marrow edema and enhancement along the superior endplate. There is no endplate irregularity or destruction. There is no signal abnormality or enhancement the intervening disc space. The loss of height appears similar to the CT cervical spine from 2016 and is favored to reflect a chronic compression fracture with a superimposed Schmorl's node. The other vertebral body heights are preserved. Background marrow signal is normal. There is mild degenerative endplate marrow edema in the mid and lower thoracic spine. There is no suspicious marrow signal abnormality. Cord:  Normal in signal and morphology without abnormal enhancement.  Paraspinal and other soft tissues: Unremarkable. Disc levels: There is disc desiccation and narrowing in the mid and lower thoracic spine. There are small disc protrusions in the mid and lower thoracic spine without significant spinal canal stenosis. There is overall mild facet arthropathy without high-grade neural foraminal stenosis. There is no evidence of cord or nerve root impingement. MRI LUMBAR SPINE FINDINGS Segmentation: Standard; the lowest formed disc space is designated L5-S1. Alignment:  There is levocurvature centered at L2-L3. Vertebrae: Vertebral body heights are preserved. Background marrow signal is normal. There is edema and patchy enhancement along the L1-L2 disc space, eccentric to the left. There is no endplate irregularity or destruction. There is no signal abnormality or enhancement in the intervening disc space. There is no other marrow edema or enhancement. Conus medullaris and cauda equina: Conus extends to the L1 level. Conus and cauda equina appear normal. There is no abnormal thickening or enhancement of the cauda equina nerve roots to suggest Guillain-Barre syndrome. Paraspinal and other soft tissues: A left renal cyst is noted requiring no specific imaging follow-up. The bladder is distended. The paraspinal soft tissues are unremarkable. Disc levels: T12-L1: There is a minimal disc bulge without significant spinal canal or neural foraminal stenosis L1-L2: There is a diffuse disc bulge, degenerative endplate spurring, and mild bilateral facet arthropathy resulting in moderate spinal canal stenosis with bilateral subarticular zone narrowing and no significant neural foraminal stenosis L2-L3: There is a diffuse disc bulge, degenerative endplate spurring, and mild bilateral facet arthropathy resulting in severe spinal canal stenosis with effacement of the thecal sac and compression of the cauda equina nerve roots, and mild bilateral neural foraminal stenosis L3-L4: There is a diffuse disc  bulge eccentric to the right, degenerative endplate spurring, and mild facet arthropathy resulting in moderate spinal canal stenosis with right worse than left subarticular zone narrowing and mild bilateral neural foraminal stenosis L4-L5: There is a diffuse disc bulge, degenerative endplate spurring, and advanced bilateral facet arthropathy resulting in severe spinal canal stenosis with effacement of the thecal sac and compression of the cauda equina nerve roots, and moderate right and mild left neural foraminal stenosis L5-S1: There is a diffuse disc bulge eccentric to the right and advanced left and moderate right facet arthropathy resulting in right subarticular zone effacement with likely impingement of the traversing right S1 nerve root, and severe right worse than left neural foraminal stenosis. IMPRESSION: 1. No abnormal signal or enhancement involving the spinal cord or cauda equina nerve roots. 2. Remote nonunited type 2 dens fracture. Edema and enhancement in the adjacent C2 vertebral body and right C1 and C2 lateral masses is favored degenerative/reactive in nature, with infection or acute  injury considered less likely. 3. 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 No other high-grade neural foraminal stenosis, and no significant spinal canal stenosis at any level. 4. Mild compression deformity of the T1 vertebral body appears similar to the CT cervical spine from 2016. Mild marrow edema along the superior endplate is favored degenerative in nature and possibly related to a superimposed Schmorl's node; however, acute injury superimposed on pre-existing fracture is not entirely excluded. 5. Overall mild degenerative change of the thoracic spine without high-grade spinal canal or neural foraminal stenosis. 6. Multifactorial severe spinal canal stenosis at L2-L3 and L4-L5 with compression of the cauda equina nerve roots,  and moderate spinal canal stenosis at L3-L4. 7. Degenerative endplate edema and enhancement at L1-L2, eccentric to the left. No endplate irregularity or destruction to suggest infection. 8. Right subarticular zone effacement with impingement of the traversing right S1 nerve root, and severe right worse than left neural foraminal stenosis at L5-S1. 9. Distended urinary bladder. Electronically Signed   By: Valetta Mole M.D.   On: 12/10/2022 13:04   MR THORACIC SPINE W WO CONTRAST  Result Date: 12/10/2022 CLINICAL DATA:  Worsening lower extremity weakness now with relative ataxia since upper respiratory infection in February. 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 EXAM: MRI CERVICAL, THORACIC AND LUMBAR SPINE WITHOUT AND WITH CONTRAST TECHNIQUE: Multiplanar and multiecho pulse sequences of the cervical spine, to include the craniocervical junction and cervicothoracic junction, and thoracic and lumbar spine, were obtained without and with intravenous contrast. CONTRAST:  48mL GADAVIST GADOBUTROL 1 MMOL/ML IV SOLN COMPARISON:  CT abdomen/pelvis 11/09/2022, cervical spine CT and lumbar radiographs 01/10/2015 FINDINGS: MRI CERVICAL SPINE FINDINGS Alignment: There is unchanged mild anterolisthesis of C5 on C6. There is straightening of the normal cervical lordosis. Vertebrae: A type 2 dens fracture is again seen, similar in alignment to the study from 2016 consistent with chronic fracture. Edema in the adjacent C2 vertebral body and right C1 and C2 lateral masses is favored degenerative in nature. Otherwise, vertebral body heights are preserved. There is no evidence of new acute fracture. There is mild degenerative endplate edema at D34-534 and C7-T1 Cord: Normal in signal and morphology. There is no abnormal enhancement. Posterior Fossa, vertebral arteries, paraspinal tissues: The posterior fossa is assessed on the separately dictated brain MRI.  The vertebral artery flow voids are normal. The paraspinal soft tissues are unremarkable. Disc levels: C2-C3: Left worse than right facet arthropathy without significant spinal canal or neural foraminal stenosis C3-C4: There is mild endplate spurring and facet arthropathy resulting mild right and no significant left neural foraminal stenosis and no significant spinal canal stenosis C4-C5: There is left worse than right endplate spurring and facet arthropathy resulting in severe left and mild right neural foraminal stenosis without significant spinal canal stenosis C5-C6: There is anterolisthesis with uncovering of the disc posteriorly and associated uncovertebral and facet arthropathy, worse on the right, but no significant spinal canal or neural foraminal stenosis C6-C7: Mild endplate spurring and facet arthropathy without significant spinal canal or neural foraminal stenosis C7-T1: No significant spinal canal or neural foraminal stenosis. MRI THORACIC SPINE FINDINGS Alignment:  Normal. Vertebrae: There is mild compression deformity of the T1 vertebral body with proximally 10% loss of vertebral body height. There is mild marrow edema and enhancement along the superior endplate. There is no endplate irregularity or destruction. There is no signal abnormality or enhancement  the intervening disc space. The loss of height appears similar to the CT cervical spine from 2016 and is favored to reflect a chronic compression fracture with a superimposed Schmorl's node. The other vertebral body heights are preserved. Background marrow signal is normal. There is mild degenerative endplate marrow edema in the mid and lower thoracic spine. There is no suspicious marrow signal abnormality. Cord:  Normal in signal and morphology without abnormal enhancement. Paraspinal and other soft tissues: Unremarkable. Disc levels: There is disc desiccation and narrowing in the mid and lower thoracic spine. There are small disc protrusions in  the mid and lower thoracic spine without significant spinal canal stenosis. There is overall mild facet arthropathy without high-grade neural foraminal stenosis. There is no evidence of cord or nerve root impingement. MRI LUMBAR SPINE FINDINGS Segmentation: Standard; the lowest formed disc space is designated L5-S1. Alignment:  There is levocurvature centered at L2-L3. Vertebrae: Vertebral body heights are preserved. Background marrow signal is normal. There is edema and patchy enhancement along the L1-L2 disc space, eccentric to the left. There is no endplate irregularity or destruction. There is no signal abnormality or enhancement in the intervening disc space. There is no other marrow edema or enhancement. Conus medullaris and cauda equina: Conus extends to the L1 level. Conus and cauda equina appear normal. There is no abnormal thickening or enhancement of the cauda equina nerve roots to suggest Guillain-Barre syndrome. Paraspinal and other soft tissues: A left renal cyst is noted requiring no specific imaging follow-up. The bladder is distended. The paraspinal soft tissues are unremarkable. Disc levels: T12-L1: There is a minimal disc bulge without significant spinal canal or neural foraminal stenosis L1-L2: There is a diffuse disc bulge, degenerative endplate spurring, and mild bilateral facet arthropathy resulting in moderate spinal canal stenosis with bilateral subarticular zone narrowing and no significant neural foraminal stenosis L2-L3: There is a diffuse disc bulge, degenerative endplate spurring, and mild bilateral facet arthropathy resulting in severe spinal canal stenosis with effacement of the thecal sac and compression of the cauda equina nerve roots, and mild bilateral neural foraminal stenosis L3-L4: There is a diffuse disc bulge eccentric to the right, degenerative endplate spurring, and mild facet arthropathy resulting in moderate spinal canal stenosis with right worse than left subarticular  zone narrowing and mild bilateral neural foraminal stenosis L4-L5: There is a diffuse disc bulge, degenerative endplate spurring, and advanced bilateral facet arthropathy resulting in severe spinal canal stenosis with effacement of the thecal sac and compression of the cauda equina nerve roots, and moderate right and mild left neural foraminal stenosis L5-S1: There is a diffuse disc bulge eccentric to the right and advanced left and moderate right facet arthropathy resulting in right subarticular zone effacement with likely impingement of the traversing right S1 nerve root, and severe right worse than left neural foraminal stenosis. IMPRESSION: 1. No abnormal signal or enhancement involving the spinal cord or cauda equina nerve roots. 2. Remote nonunited type 2 dens fracture. Edema and enhancement in the adjacent C2 vertebral body and right C1 and C2 lateral masses is favored degenerative/reactive in nature, with infection or acute injury considered less likely. 3. 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 No other high-grade neural foraminal stenosis, and no significant spinal canal stenosis at any level. 4. Mild compression deformity of the T1 vertebral body appears similar to the CT cervical spine from 2016. Mild marrow edema along the superior endplate is  favored degenerative in nature and possibly related to a superimposed Schmorl's node; however, acute injury superimposed on pre-existing fracture is not entirely excluded. 5. Overall mild degenerative change of the thoracic spine without high-grade spinal canal or neural foraminal stenosis. 6. 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 7. Degenerative endplate edema and enhancement at L1-L2, eccentric to the left. No endplate irregularity or destruction to  suggest infection. 8. Right subarticular zone effacement with impingement of the traversing right S1 nerve root, and severe right worse than left neural foraminal stenosis at L5-S1. 9. Distended urinary bladder. Electronically Signed   By: Valetta Mole M.D.   On: 12/10/2022 13:04   MR Lumbar Spine W Wo Contrast  Result Date: 12/10/2022 CLINICAL DATA:  Worsening lower extremity weakness now with relative ataxia since upper respiratory infection in February. 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 EXAM: MRI CERVICAL, THORACIC AND LUMBAR SPINE WITHOUT AND WITH CONTRAST TECHNIQUE: Multiplanar and multiecho pulse sequences of the cervical spine, to include the craniocervical junction and cervicothoracic junction, and thoracic and lumbar spine, were obtained without and with intravenous contrast. CONTRAST:  42mL GADAVIST GADOBUTROL 1 MMOL/ML IV SOLN COMPARISON:  CT abdomen/pelvis 11/09/2022, cervical spine CT and lumbar radiographs 01/10/2015 FINDINGS: MRI CERVICAL SPINE FINDINGS Alignment: There is unchanged mild anterolisthesis of C5 on C6. There is straightening of the normal cervical lordosis. Vertebrae: A type 2 dens fracture is again seen, similar in alignment to the study from 2016 consistent with chronic fracture. Edema in the adjacent C2 vertebral body and right C1 and C2 lateral masses is favored degenerative in nature. Otherwise, vertebral body heights are preserved. There is no evidence of new acute fracture. There is mild degenerative endplate edema at [id number] and C7-T1 Cord: Normal in signal and morphology. There is no abnormal enhancement. Posterior Fossa, vertebral arteries, paraspinal tissues: The posterior fossa is assessed on the separately dictated brain MRI. The vertebral artery flow voids are normal. The paraspinal soft tissues are unremarkable. Disc levels: C2-C3: Left worse than right facet arthropathy without significant  spinal canal or neural foraminal stenosis C3-C4: There is mild endplate spurring and facet arthropathy resulting mild right and no significant left neural foraminal stenosis and no significant spinal canal stenosis C4-C5: There is left worse than right endplate spurring and facet arthropathy resulting in severe left and mild right neural foraminal stenosis without significant spinal canal stenosis C5-C6: There is anterolisthesis with uncovering of the disc posteriorly and associated uncovertebral and facet arthropathy, worse on the right, but no significant spinal canal or neural foraminal stenosis C6-C7: Mild endplate spurring and facet arthropathy without significant spinal canal or neural foraminal stenosis C7-T1: No significant spinal canal or neural foraminal stenosis. MRI THORACIC SPINE FINDINGS Alignment:  Normal. Vertebrae: There is mild compression deformity of the T1 vertebral body with proximally 10% loss of vertebral body height. There is mild marrow edema and enhancement along the superior endplate. There is no endplate irregularity or destruction. There is no signal abnormality or enhancement the intervening disc space. The loss of height appears similar to the CT cervical spine from 2016 and is favored to reflect a chronic compression fracture with a superimposed Schmorl's node. The other vertebral body heights are preserved. Background marrow signal is normal. There is mild degenerative endplate marrow edema in the mid and lower thoracic spine. There is no suspicious marrow signal abnormality. Cord:  Normal in signal and morphology without abnormal enhancement. Paraspinal and other soft tissues: Unremarkable. Disc levels: There is disc desiccation and narrowing in the mid and lower thoracic spine. There are small disc protrusions in the mid and lower thoracic spine without significant spinal canal stenosis. There is overall mild facet arthropathy without high-grade neural foraminal stenosis. There is  no evidence of cord or nerve root impingement. MRI LUMBAR SPINE FINDINGS Segmentation: Standard; the lowest formed disc space is designated L5-S1. Alignment:  There is levocurvature centered at L2-L3. Vertebrae: Vertebral body heights are preserved. Background marrow signal is normal. There is edema and patchy enhancement along the L1-L2 disc space, eccentric to the left. There is no endplate irregularity or destruction. There is no signal abnormality or enhancement in the intervening disc space. There is no other marrow edema or enhancement. Conus medullaris and cauda equina: Conus extends to the L1 level. Conus and cauda equina appear normal. There is no abnormal thickening or enhancement of the cauda equina nerve roots to suggest Guillain-Barre syndrome. Paraspinal and other soft tissues: A left renal cyst is noted requiring no specific imaging follow-up. The bladder is distended. The paraspinal soft tissues are unremarkable. Disc levels: T12-L1: There is a minimal disc bulge without significant spinal canal or neural foraminal stenosis L1-L2: There is a diffuse disc bulge, degenerative endplate spurring, and mild bilateral facet arthropathy resulting in moderate spinal canal stenosis with bilateral subarticular zone narrowing and no significant neural foraminal stenosis L2-L3: There is a diffuse disc bulge, degenerative endplate spurring, and mild bilateral facet arthropathy resulting in severe spinal canal stenosis with effacement of the thecal sac and compression of the cauda equina nerve roots, and mild bilateral neural foraminal stenosis L3-L4: There is a diffuse disc bulge eccentric to the right, degenerative endplate spurring, and mild facet arthropathy resulting in moderate spinal canal stenosis with right worse than left subarticular zone narrowing and mild bilateral neural foraminal stenosis L4-L5: There is a diffuse disc bulge, degenerative endplate spurring, and advanced bilateral facet arthropathy  resulting in severe spinal canal stenosis with effacement of the thecal sac and compression of the cauda equina nerve roots, and moderate right and mild left neural foraminal stenosis L5-S1: There is a diffuse disc bulge eccentric to the right and advanced left and moderate right facet arthropathy resulting in right subarticular zone effacement with likely impingement of the traversing right S1 nerve root, and severe right worse than left neural foraminal stenosis. IMPRESSION: 1. No abnormal signal or enhancement involving the spinal cord or cauda equina nerve roots. 2. Remote nonunited type 2 dens fracture. Edema and enhancement in the adjacent C2 vertebral body and right C1 and C2 lateral masses is favored degenerative/reactive in nature, with infection or acute injury considered less likely. 3. 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 No other high-grade neural foraminal stenosis, and no significant spinal canal stenosis at any level. 4. Mild compression deformity of the T1 vertebral body appears similar to the CT cervical spine from 2016. Mild marrow edema along the superior endplate is favored degenerative in nature and possibly related to a superimposed Schmorl's node; however, acute injury superimposed on pre-existing fracture is not entirely excluded. 5. Overall mild degenerative change of the thoracic spine without high-grade spinal canal or neural foraminal stenosis. 6. 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 7.  Degenerative endplate edema and enhancement at L1-L2, eccentric to the left. No endplate irregularity or destruction to suggest infection. 8. Right subarticular zone effacement with impingement of the traversing right S1 nerve root, and severe right worse than left neural foraminal stenosis at  L5-S1. 9. Distended urinary bladder. Electronically Signed   By: Valetta Mole M.D.   On: 12/10/2022 13:04   US SCROTUM W/DOPPLER  Result Date: 12/10/2022 CLINICAL DATA:  Right testicular pain. EXAM: SCROTAL ULTRASOUND DOPPLER ULTRASOUND OF THE TESTICLES TECHNIQUE: Complete ultrasound examination of the testicles, epididymis, and other scrotal structures was performed. Color and spectral Doppler ultrasound were also utilized to evaluate blood flow to the testicles. COMPARISON:  None Available. FINDINGS: Right testicle Measurements: 4.2 x 2.7 x 2.6 cm. No mass or microlithiasis visualized. Left testicle Measurements: 3.7 x 2.6 x 2.0 cm. No mass or microlithiasis visualized. There is noted a complex fluid collection measuring 4.3 x 2.6 x 2.0 cm medial and superior to the left testicle concerning for possible abscess. Right epididymis: Enlarged and hypervascular suggesting epididymitis. Left epididymis:  Normal in size and appearance. Hydrocele:  Bilateral hydroceles are noted, right greater than left. Varicocele:  Mild left varicocele is noted. Pulsed Doppler interrogation of both testes demonstrates normal low resistance arterial and venous waveforms bilaterally. IMPRESSION: No evidence of testicular mass or torsion. Probable right epididymitis. 4.3 x 2.6 x 2.0 cm complex fluid collection is seen medial and superior to left testicle concerning for possible abscess. Bilateral hydroceles are noted, right greater than left. Electronically Signed   By: Marijo Conception M.D.   On: 12/10/2022 11:40   CT Head Wo Contrast  Result Date: 12/10/2022 CLINICAL DATA:  Neuro deficit with stroke suspected EXAM: CT HEAD WITHOUT CONTRAST TECHNIQUE: Contiguous axial images were obtained from the base of the skull through the vertex without intravenous contrast. RADIATION DOSE REDUCTION: This exam was performed according to the departmental dose-optimization program which includes automated exposure control, adjustment of the mA  and/or kV according to patient size and/or use of iterative reconstruction technique. COMPARISON:  05/16/2015 FINDINGS: Brain: No evidence of acute infarction, hemorrhage, hydrocephalus, extra-axial collection or mass lesion/mass effect. Generalized cerebral volume loss since prior. Vascular: No hyperdense vessel or unexpected calcification. Skull: Normal. Negative for fracture or focal lesion. Sinuses/Orbits: No acute finding. IMPRESSION: Aging brain without acute or reversible finding Electronically Signed   By: Jorje Guild M.D.   On: 12/10/2022 04:12      Labs: BNP (last 3 results) No results for input(s): "BNP" in the last 8760 hours. Basic Metabolic Panel: Recent Labs  Lab 12/10/22 0313 12/11/22 0414 12/13/22 0513  NA 133* 138 135  K 4.1 3.9 3.9  CL 103 107 104  CO2 22 22 24   GLUCOSE 101* 94 105*  BUN 15 19 23   CREATININE 1.06 0.93 0.78  CALCIUM 9.7 9.5 9.2  MG  --   --  2.0   Liver Function Tests: Recent Labs  Lab 12/10/22 0313 12/11/22 0414  AST 17 14*  ALT 11 10  ALKPHOS 42 38  BILITOT 0.7 0.7  PROT 6.4* 6.0*  ALBUMIN 3.3* 2.9*   No results for input(s): "LIPASE", "AMYLASE" in the last 168 hours. No results for input(s): "AMMONIA" in the last 168 hours. CBC: Recent Labs  Lab 12/10/22 0313 12/11/22 0414 12/13/22 0513  WBC 7.2 6.4 8.8  NEUTROABS 4.4  --   --   HGB 9.9* 9.7* 9.6*  HCT 30.9* 30.4* 29.7*  MCV 67.3* 66.7* 66.1*  PLT  265 266 300   Cardiac Enzymes: Recent Labs  Lab 12/10/22 0313  CKTOTAL 44*   BNP: Invalid input(s): "POCBNP" CBG: Recent Labs  Lab 12/11/22 0811 12/11/22 1142 12/11/22 1542  GLUCAP 83 99 122*   D-Dimer No results for input(s): "DDIMER" in the last 72 hours. Hgb A1c No results for input(s): "HGBA1C" in the last 72 hours. Lipid Profile No results for input(s): "CHOL", "HDL", "LDLCALC", "TRIG", "CHOLHDL", "LDLDIRECT" in the last 72 hours. Thyroid function studies No results for input(s): "TSH", "T4TOTAL",  "T3FREE", "THYROIDAB" in the last 72 hours.  Invalid input(s): "FREET3" Anemia work up No results for input(s): "VITAMINB12", "FOLATE", "FERRITIN", "TIBC", "IRON", "RETICCTPCT" in the last 72 hours. Urinalysis    Component Value Date/Time   COLORURINE RED (A) 11/28/2022 0008   APPEARANCEUR TURBID (A) 11/28/2022 0008   LABSPEC 1.020 11/28/2022 0008   PHURINE  11/28/2022 0008    TEST NOT REPORTED DUE TO COLOR INTERFERENCE OF URINE PIGMENT   GLUCOSEU (A) 11/28/2022 0008    TEST NOT REPORTED DUE TO COLOR INTERFERENCE OF URINE PIGMENT   HGBUR (A) 11/28/2022 0008    TEST NOT REPORTED DUE TO COLOR INTERFERENCE OF URINE PIGMENT   BILIRUBINUR small 12/07/2022 1407   KETONESUR (A) 11/28/2022 0008    TEST NOT REPORTED DUE TO COLOR INTERFERENCE OF URINE PIGMENT   PROTEINUR Positive (A) 12/07/2022 1407   PROTEINUR (A) 11/28/2022 0008    TEST NOT REPORTED DUE TO COLOR INTERFERENCE OF URINE PIGMENT   UROBILINOGEN 0.2 12/07/2022 1407   NITRITE positive 12/07/2022 1407   NITRITE (A) 11/28/2022 0008    TEST NOT REPORTED DUE TO COLOR INTERFERENCE OF URINE PIGMENT   LEUKOCYTESUR Large (3+) (A) 12/07/2022 1407   LEUKOCYTESUR (A) 11/28/2022 0008    TEST NOT REPORTED DUE TO COLOR INTERFERENCE OF URINE PIGMENT   Sepsis Labs Recent Labs  Lab 12/10/22 0313 12/11/22 0414 12/13/22 0513  WBC 7.2 6.4 8.8   Microbiology Recent Results (from the past 240 hour(s))  Urine Culture     Status: Abnormal   Collection Time: 12/07/22  2:21 PM   Specimen: Urine  Result Value Ref Range Status   MICRO NUMBER: [id number]  Final   SPECIMEN QUALITY: Adequate  Final   Sample Source URINE  Final   STATUS: FINAL  Final   ISOLATE 1: Serratia marcescens (A)  Final    Comment: Greater than 100,000 CFU/mL of Serratia marcescens      Susceptibility   Serratia marcescens - URINE CULTURE, REFLEX    AMOX/CLAVULANIC >=32 Resistant     CEFAZOLIN* >=64 Resistant      * For uncomplicated UTI caused by E. coli, K.  pneumoniae or P. mirabilis: Cefazolin is susceptible if MIC <32 mcg/mL and predicts susceptible to the oral agents cefaclor, cefdinir, cefpodoxime, cefprozil, cefuroxime, cephalexin and loracarbef.     CEFTAZIDIME <=1 Sensitive     CEFEPIME <=1 Sensitive     CEFTRIAXONE <=1 Sensitive     CIPROFLOXACIN <=0.25 Sensitive     LEVOFLOXACIN <=0.12 Sensitive     GENTAMICIN <=1 Sensitive     NITROFURANTOIN 256 Resistant     TOBRAMYCIN 2 Sensitive     TRIMETH/SULFA* <=20 Sensitive      * For uncomplicated UTI caused by E. coli, K. pneumoniae or P. mirabilis: Cefazolin is susceptible if MIC <32 mcg/mL and predicts susceptible to the oral agents cefaclor, cefdinir, cefpodoxime, cefprozil, cefuroxime, cephalexin and loracarbef. Legend: S = Susceptible  I = Intermediate R = Resistant  NS =  Not susceptible * = Not tested  NR = Not reported **NN = See antimicrobic comments      Total time spend on discharging this patient, including the last patient exam, discussing the hospital stay, instructions for ongoing care as it relates to all pertinent caregivers, as well as preparing the medical discharge records, prescriptions, and/or referrals as applicable, is 30 minutes.    Enzo Bi, MD  [facility] 12/14/2022, 1:26 PM

## 2022-12-14 NOTE — Care Management Important Message (Signed)
Important Message  Patient Details  Name: Koal Martignetti MRN: [id number] Date of Birth: 08-17-1941   Medicare Important Message Given:  Yes     Dannette Barbara 12/14/2022, 2:45 PM

## 2022-12-15 DIAGNOSIS — D508 Other iron deficiency anemias: Secondary | ICD-10-CM | POA: Diagnosis not present

## 2022-12-15 DIAGNOSIS — M6281 Muscle weakness (generalized): Secondary | ICD-10-CM | POA: Diagnosis not present

## 2022-12-15 DIAGNOSIS — M4800 Spinal stenosis, site unspecified: Secondary | ICD-10-CM | POA: Diagnosis not present

## 2022-12-15 DIAGNOSIS — E038 Other specified hypothyroidism: Secondary | ICD-10-CM | POA: Diagnosis not present

## 2022-12-15 DIAGNOSIS — M1909 Primary osteoarthritis, other specified site: Secondary | ICD-10-CM | POA: Diagnosis not present

## 2022-12-15 DIAGNOSIS — N451 Epididymitis: Secondary | ICD-10-CM | POA: Diagnosis not present

## 2022-12-15 DIAGNOSIS — I10 Essential (primary) hypertension: Secondary | ICD-10-CM | POA: Diagnosis not present

## 2022-12-15 NOTE — Telephone Encounter (Signed)
Joe Hardy confirmed appt for 12/30/2022

## 2022-12-17 DIAGNOSIS — R29898 Other symptoms and signs involving the musculoskeletal system: Secondary | ICD-10-CM | POA: Diagnosis not present

## 2022-12-17 DIAGNOSIS — M6259 Muscle wasting and atrophy, not elsewhere classified, multiple sites: Secondary | ICD-10-CM | POA: Diagnosis not present

## 2022-12-17 DIAGNOSIS — M4800 Spinal stenosis, site unspecified: Secondary | ICD-10-CM | POA: Diagnosis not present

## 2022-12-17 DIAGNOSIS — E038 Other specified hypothyroidism: Secondary | ICD-10-CM | POA: Diagnosis not present

## 2022-12-17 DIAGNOSIS — M6281 Muscle weakness (generalized): Secondary | ICD-10-CM | POA: Diagnosis not present

## 2022-12-17 DIAGNOSIS — R279 Unspecified lack of coordination: Secondary | ICD-10-CM | POA: Diagnosis not present

## 2022-12-17 DIAGNOSIS — M1909 Primary osteoarthritis, other specified site: Secondary | ICD-10-CM | POA: Diagnosis not present

## 2022-12-17 DIAGNOSIS — D508 Other iron deficiency anemias: Secondary | ICD-10-CM | POA: Diagnosis not present

## 2022-12-17 DIAGNOSIS — N451 Epididymitis: Secondary | ICD-10-CM | POA: Diagnosis not present

## 2022-12-17 DIAGNOSIS — R2681 Unsteadiness on feet: Secondary | ICD-10-CM | POA: Diagnosis not present

## 2022-12-17 DIAGNOSIS — I10 Essential (primary) hypertension: Secondary | ICD-10-CM | POA: Diagnosis not present

## 2022-12-20 ENCOUNTER — Inpatient Hospital Stay: Payer: 59 | Admitting: Internal Medicine

## 2022-12-20 ENCOUNTER — Encounter: Payer: 59 | Admitting: Physician Assistant

## 2022-12-20 DIAGNOSIS — D508 Other iron deficiency anemias: Secondary | ICD-10-CM | POA: Diagnosis not present

## 2022-12-20 DIAGNOSIS — N451 Epididymitis: Secondary | ICD-10-CM | POA: Diagnosis not present

## 2022-12-20 DIAGNOSIS — M6281 Muscle weakness (generalized): Secondary | ICD-10-CM | POA: Diagnosis not present

## 2022-12-20 DIAGNOSIS — E038 Other specified hypothyroidism: Secondary | ICD-10-CM | POA: Diagnosis not present

## 2022-12-20 DIAGNOSIS — M1909 Primary osteoarthritis, other specified site: Secondary | ICD-10-CM | POA: Diagnosis not present

## 2022-12-20 DIAGNOSIS — M4800 Spinal stenosis, site unspecified: Secondary | ICD-10-CM | POA: Diagnosis not present

## 2022-12-20 DIAGNOSIS — I10 Essential (primary) hypertension: Secondary | ICD-10-CM | POA: Diagnosis not present

## 2022-12-21 ENCOUNTER — Encounter: Payer: Self-pay | Admitting: Internal Medicine

## 2022-12-21 ENCOUNTER — Inpatient Hospital Stay: Payer: 59

## 2022-12-21 ENCOUNTER — Inpatient Hospital Stay: Payer: 59 | Attending: Internal Medicine | Admitting: Internal Medicine

## 2022-12-21 VITALS — BP 113/75 | HR 81 | Temp 98.7°F | Resp 17 | Ht 69.0 in | Wt 165.6 lb

## 2022-12-21 DIAGNOSIS — Z87891 Personal history of nicotine dependence: Secondary | ICD-10-CM | POA: Diagnosis not present

## 2022-12-21 DIAGNOSIS — C689 Malignant neoplasm of urinary organ, unspecified: Secondary | ICD-10-CM

## 2022-12-21 DIAGNOSIS — R2681 Unsteadiness on feet: Secondary | ICD-10-CM | POA: Diagnosis not present

## 2022-12-21 DIAGNOSIS — E039 Hypothyroidism, unspecified: Secondary | ICD-10-CM | POA: Insufficient documentation

## 2022-12-21 DIAGNOSIS — I10 Essential (primary) hypertension: Secondary | ICD-10-CM | POA: Diagnosis not present

## 2022-12-21 DIAGNOSIS — Z79890 Hormone replacement therapy (postmenopausal): Secondary | ICD-10-CM | POA: Diagnosis not present

## 2022-12-21 DIAGNOSIS — R279 Unspecified lack of coordination: Secondary | ICD-10-CM | POA: Diagnosis not present

## 2022-12-21 DIAGNOSIS — R29898 Other symptoms and signs involving the musculoskeletal system: Secondary | ICD-10-CM | POA: Diagnosis not present

## 2022-12-21 DIAGNOSIS — M4800 Spinal stenosis, site unspecified: Secondary | ICD-10-CM | POA: Diagnosis not present

## 2022-12-21 DIAGNOSIS — N451 Epididymitis: Secondary | ICD-10-CM | POA: Diagnosis not present

## 2022-12-21 DIAGNOSIS — M1909 Primary osteoarthritis, other specified site: Secondary | ICD-10-CM | POA: Diagnosis not present

## 2022-12-21 DIAGNOSIS — C679 Malignant neoplasm of bladder, unspecified: Secondary | ICD-10-CM | POA: Diagnosis not present

## 2022-12-21 DIAGNOSIS — R591 Generalized enlarged lymph nodes: Secondary | ICD-10-CM

## 2022-12-21 DIAGNOSIS — D509 Iron deficiency anemia, unspecified: Secondary | ICD-10-CM | POA: Diagnosis not present

## 2022-12-21 DIAGNOSIS — D508 Other iron deficiency anemias: Secondary | ICD-10-CM | POA: Diagnosis not present

## 2022-12-21 DIAGNOSIS — E038 Other specified hypothyroidism: Secondary | ICD-10-CM | POA: Diagnosis not present

## 2022-12-21 DIAGNOSIS — M6281 Muscle weakness (generalized): Secondary | ICD-10-CM | POA: Diagnosis not present

## 2022-12-21 DIAGNOSIS — M6259 Muscle wasting and atrophy, not elsewhere classified, multiple sites: Secondary | ICD-10-CM | POA: Diagnosis not present

## 2022-12-21 NOTE — Progress Notes (Signed)
[facility] CONSULT NOTE  Patient Care Team: Lorre Munroe, NP as PCP - General (Internal Medicine) Lynett Fish, MD as Referring Physician (Psychiatry) Beverlee Nims, LCSW as Social Worker (Psychiatry)  REFERRING PROVIDER: Dr. Richardo Hanks  REASON FOR REFFERAL: urothelial cancer  CANCER STAGING   Cancer Staging  Urothelial carcinoma Staging form: Urinary Bladder, AJCC 8th Edition - Clinical stage from 11/26/2022: Stage IIIA (cT4a, cN1, cM0) - Signed by Michaelyn Barter, MD on 12/21/2022 Stage prefix: Initial diagnosis WHO/ISUP grade (low/high): High Grade Histologic grading system: 2 grade system   ASSESSMENT & PLAN:  Joe Hardy 82 y.o. male with pmh of hypertension, IDA, bladder stone was referred to medical oncology for management of urothelial cancer.  # Urothelial cancer -CT abdomen pelvis done for gross hematuria from February 2024 was reviewed.  It is showing multiple bladder stones, enlarged prostate and single pathological lymph node in the lower anterior pelvis measuring 12 mm.  -Status post cystolitholopaxy and HoLEP on 11/26/2022 by Dr. Richardo Hanks.  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  I discussed with the patient and his friend about the imaging findings, pathology report and treatment options.  He at least has stage IIIa.  I will obtain a CT chest with contrast to complete the staging.  -Considering his poor functional status and age, I think he will not be a good candidate for platinum based neoadjuvant chemotherapy followed by surgery.  I discussed with him about concurrent chemo RT with low doses of gemcitabine 27 mg/m biweekly for total 4 weeks for better tolerance.  Radiation would be done daily 5 days a week.  Versus palliative care.  Based on the information that we know so far, his cancer is still localized so he would like to take his chances and proceed with the  treatment.   He is living at [facility] rehab facility and do not have a reliable social support system and transport system.  We will reach out to the facility to see if they could provide transportation for cancer treatment. Patient reports that he has been noticing improvement in his strength and ability to walk since he has been at rehab.  In the interim, patient will have some time to functionally recover.  I would like to follow-up with him in 2 weeks with CT chest results.  # Microcytic anemia -Likely from iron deficiency from prior hematuria.  Next visit we will discuss about starting iron supplements.  # Hypothyroidism-on levothyroxine    Orders Placed This Encounter  Procedures   CT Chest W Contrast    Standing Status:   Future    Standing Expiration Date:   12/21/2023    Order Specific Question:   If indicated for the ordered procedure, I authorize the administration of contrast media per Radiology protocol    Answer:   Yes    Order Specific Question:   Does the patient have a contrast media/X-ray dye allergy?    Answer:   No    Order Specific Question:   Preferred imaging location?    Answer:   [facility]   RTC in 2 weeks for MD visit to discuss CT chest.  No labs today.  The total time spent in the appointment was 60 minutes encounter with patients including review of chart and various tests results, discussions about plan of care and coordination of care plan   All questions were answered. The patient knows to call the  clinic with any problems, questions or concerns. No barriers to learning was detected.  Michaelyn BarterKavita Sheretta Grumbine, MD 4/9/20241:15 PM   HISTORY OF PRESENTING ILLNESS:  Joe Hardy 82 y.o. male with pmh of hypertension, IDA, bladder stone was referred to medical oncology for management of urothelial cancer.  Patient had multiple ER visits in the last month.   11/27/2022 - due to concerns for Foley catheter falling and bleeding.  Catheter was replaced and  discharged.   11/28/2022 again with the concerns that the Foley came all the way out.  The Foley was examined and was intact.   12/06/2022-seen by PCP for right scrotal pain.  Urine culture grew Serratia.  Ultrasound showed possible fluid collection around his left testicle could be abscess.  Improved with Bactrim. 12/10/2022 feeling dry mouth and progressive weakness in his bilateral legs with inability to walk.  MRI whole spine showed degenerative changes with no acute findings.  MRI brain did not show any acute changes.  He is currently at [facility] rehab facility in [place].  He was in a wheelchair accompanied by friend today.  He reports weight loss unable to quantify.  He has appetite.  Overall he feels weak but thinks has been progressing daily at the rehab.  He reports improvement in the strength in his legs.  He thinks partly it was coming from his fever.  He tells me prior to the hospitalization he was not using any walker or cane.  I have reviewed his chart and materials related to his cancer extensively and collaborated history with the patient. Summary of oncologic history is as follows: Oncology History  Urothelial carcinoma  11/09/2022 Imaging   CT abdomen pelvis done for gross hematuria showed enlarged prostate with mass effect along the base of the bladder and several bladder stone.  Solitary pathologically enlarged lymph node seen along the lower anterior left hemipelvis measuring 12 x 13 mm   11/26/2022 Procedure   S/p cystolitholapaxy and HoLEP on 11/26/2022 by Dr. Joselyn GlassmanSinsky for multiple bladder stones and enlarged prostate.   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  IHC positive for GATA3 and negative for PSA.   11/27/2022 Cancer Staging   Staging form: Urinary Bladder, AJCC 8th Edition - Clinical stage from 11/26/2022: Stage IIIA (cT4a, cN1, cM0) - Signed by Michaelyn BarterAgrawal, Maeleigh Buschman, MD on 12/21/2022 Stage prefix: Initial diagnosis WHO/ISUP grade (low/high): High Grade Histologic grading system: 2 grade system      MEDICAL HISTORY:  Past Medical History:  Diagnosis Date   Bladder stones    Depression    GERD (gastroesophageal reflux disease)    Hyperlipidemia    Hypertension    Iron deficiency anemia    Migraine    Sleep apnea     SURGICAL HISTORY: Past Surgical History:  Procedure Laterality Date   COLONOSCOPY     CYSTOSCOPY WITH LITHOLAPAXY N/A 11/26/2022   Procedure: CYSTOSCOPY WITH LITHOLAPAXY;  Surgeon: Sondra ComeSninsky, Brian C, MD;  Location: [facility] ORS;  Service: Urology;  Laterality: N/A;   HOLEP-LASER ENUCLEATION OF THE PROSTATE WITH MORCELLATION N/A 11/26/2022   Procedure: HOLEP-LASER ENUCLEATION OF THE PROSTATE WITH MORCELLATION;  Surgeon: Sondra ComeSninsky, Brian C, MD;  Location: [facility] ORS;  Service:  Urology;  Laterality: N/A;   oral surgery     TONSILLECTOMY      SOCIAL HISTORY: Social History   Socioeconomic History   Marital status: Divorced    Spouse name: Not on file   Number of children: Not on file   Years of education: Not on file   Highest education level: Bachelor's degree (e.g., BA, AB, BS)  Occupational History   Occupation: retired  Tobacco Use   Smoking status: Former    Packs/day: 1.50    Years: 20.00    Additional pack years: 0.00    Total pack years: 30.00    Types: Cigarettes    Quit date: 05/02/2017    Years since quitting: 5.6   Smokeless tobacco: Former   Tobacco comments:    Starts and stops related to anxiety rather than drinking alcohol  Vaping Use   Vaping Use: Never used  Substance and Sexual Activity   Alcohol use: No     Alcohol/week: 0.0 standard drinks of alcohol    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)   Drug use: Not Currently    Types: Marijuana   Sexual activity: Not Currently  Other Topics Concern   Not on file  Social History Narrative   Not on file   Social Determinants of Health   Financial Resource Strain: Low Risk  (10/09/2021)   Overall Financial Resource Strain (CARDIA)    Difficulty of Paying Living Expenses: Not hard at all  Food Insecurity: No Food Insecurity (12/21/2022)   Hunger Vital Sign    Worried About Running Out of Food in the Last Year: Never true    Ran Out of Food in the Last Year: Never true  Transportation Needs: Unmet Transportation Needs (12/10/2022)   PRAPARE - Administrator, Civil Service (Medical): Yes    Lack of Transportation (Non-Medical): No  Physical Activity: Inactive (10/09/2021)   Exercise Vital Sign    Days of Exercise per Week: 0 days    Minutes of Exercise per Session: 0 min  Stress: No Stress Concern Present (10/09/2021)   [employer] of Occupational Health - Occupational Stress Questionnaire    Feeling of Stress : Not at all  Social Connections: Moderately Isolated (10/09/2021)   Social Connection and Isolation Panel [NHANES]    Frequency of Communication with Friends and Family: More than three times a week    Frequency of Social Gatherings with Friends and Family: More than three times a week    Attends Religious Services: Never    Database administrator or Organizations: Yes    Attends Banker Meetings: Never    Marital Status: Divorced  Catering manager Violence: Not At Risk (12/21/2022)   Humiliation, Afraid, Rape, and Kick questionnaire    Fear of Current or Ex-Partner: No    Emotionally Abused: No    Physically Abused: No    Sexually Abused: No    FAMILY HISTORY: Family History  Problem Relation Age of Onset   Tuberculosis Mother    Asthma Mother    Emphysema Father    Heart attack Father      ALLERGIES:  is allergic to fluoxetine and venlafaxine.  MEDICATIONS:  Current Outpatient Medications  Medication Sig Dispense Refill   baclofen (LIORESAL) 10 MG tablet Take 5 mg by mouth 2 (two) times daily.     Blood Pressure Monitoring (SM WRIST CUFF BP MONITOR) MISC 1 Units by Does not apply route 2 (two) times a week. 1 each  0   buPROPion (WELLBUTRIN XL) 150 MG 24 hr tablet Take 450 mg by mouth daily.     busPIRone (BUSPAR) 10 MG tablet Take 1 tablet (10 mg total) by mouth 2 (two) times daily. 180 tablet 1   Cobalamin Combinations (NEURIVA PLUS PO) Take 1 capsule by mouth daily in the afternoon.     levothyroxine (SYNTHROID) 75 MCG tablet TAKE 1 TABLET BY MOUTH DAILY  BEFORE BREAKFAST 90 tablet 1   mirtazapine (REMERON) 30 MG tablet Take 30 mg by mouth at bedtime.     traZODone (DESYREL) 150 MG tablet Take 150 mg by mouth at bedtime.     Ascorbic Acid (VITAMIN C) 1000 MG tablet Take 1,000 mg by mouth daily. (Patient not taking: Reported on 12/21/2022)     atorvastatin (LIPITOR) 20 MG tablet TAKE 1 TABLET BY MOUTH  DAILY AT 6PM (Patient not taking: Reported on 12/21/2022) 90 tablet 1   b complex vitamins tablet Take 1 tablet by mouth daily. (Patient not taking: Reported on 12/21/2022)     cholecalciferol (VITAMIN D3) 25 MCG (1000 UT) tablet Take 4,000 Units by mouth daily.  (Patient not taking: Reported on 12/21/2022)     cyanocobalamin 1000 MCG tablet Take 1,000 mcg by mouth daily. (Patient not taking: Reported on 12/21/2022)     GARLIC PO Take 2 tablets by mouth daily. (Patient not taking: Reported on 12/21/2022)     Iron, Ferrous Sulfate, 325 (65 Fe) MG TABS Take 1 tablet by mouth daily. (Patient not taking: Reported on 12/21/2022)     magnesium 30 MG tablet Take 1 tablet (30 mg total) by mouth daily. (Patient not taking: Reported on 12/21/2022) 90 tablet 3   polyethylene glycol (MIRALAX / GLYCOLAX) 17 g packet Take 17 g by mouth daily. (Patient not taking: Reported on 12/21/2022)  0   Saw Palmetto  450 MG CAPS Take 450 mg by mouth daily. (Patient not taking: Reported on 12/21/2022)     senna-docusate (SENOKOT-S) 8.6-50 MG tablet Take 1 tablet by mouth daily. (Patient not taking: Reported on 12/21/2022) 30 tablet 0   sulfamethoxazole-trimethoprim (BACTRIM DS) 800-160 MG tablet Take 1 tablet by mouth 2 (two) times daily for 7 days. (Patient not taking: Reported on 12/21/2022)  0   No current facility-administered medications for this visit.    REVIEW OF SYSTEMS:   Pertinent information mentioned in HPI All other systems were reviewed with the patient and are negative.  PHYSICAL EXAMINATION: ECOG PERFORMANCE STATUS: 2 - Symptomatic, <50% confined to bed  Vitals:   12/21/22 1132  BP: 113/75  Pulse: 81  Resp: 17  Temp: 98.7 F (37.1 C)  SpO2: 97%   Filed Weights   12/21/22 1132  Weight: 165 lb 9.6 oz (75.1 kg)    GENERAL:alert, no distress and comfortable SKIN: skin color, texture, turgor are normal, no rashes or significant lesions EYES: normal, conjunctiva are pink and non-injected, sclera clear OROPHARYNX:no exudate, no erythema and lips, buccal mucosa, and tongue normal  NECK: supple, thyroid normal size, non-tender, without nodularity LYMPH:  no palpable lymphadenopathy in the cervical, axillary or inguinal LUNGS: clear to auscultation and percussion with normal breathing effort HEART: regular rate & rhythm and no murmurs and no lower extremity edema ABDOMEN:abdomen soft, non-tender and normal bowel sounds Musculoskeletal:no cyanosis of digits and no clubbing  PSYCH: alert & oriented x 3 with fluent speech NEURO: no focal motor/sensory deficits  LABORATORY DATA:  I have reviewed the data as listed Lab Results  Component Value Date  WBC 8.8 12/13/2022   HGB 9.6 (L) 12/13/2022   HCT 29.7 (L) 12/13/2022   MCV 66.1 (L) 12/13/2022   PLT 300 12/13/2022   Recent Labs    09/23/22 1012 12/10/22 0313 12/11/22 0414 12/13/22 0513  NA 140 133* 138 135  K 3.9 4.1 3.9 3.9   CL 105 103 107 104  CO2 GLUCOSE 96 101* 94 105*  BUN CREATININE 1.04 1.06 0.93 0.78  CALCIUM 9.9 9.7 9.5 9.2  GFRNONAA  --  >60 >60 >60  PROT 6.5 6.4* 6.0*  --   ALBUMIN  --  3.3* 2.9*  --   AST 19 17 14*  --   ALT --   ALKPHOS  --  42 38  --   BILITOT 0.5 0.7 0.7  --   BILIDIR  --  <0.1  --   --   IBILI  --  NOT CALCULATED  --   --     RADIOGRAPHIC STUDIES: I have personally reviewed the radiological images as listed and agreed with the findings in the report. MR BRAIN W WO CONTRAST  Result Date: 12/10/2022 CLINICAL DATA:  Worsening lower extremity weakness with relative ataxia following URI in February. EXAM: MRI HEAD WITHOUT AND WITH CONTRAST TECHNIQUE: Multiplanar, multiecho pulse sequences of the brain and surrounding structures were obtained without and with intravenous contrast. CONTRAST:  6mL GADAVIST GADOBUTROL 1 MMOL/ML IV SOLN COMPARISON:  CT head obtained earlier the same day FINDINGS: Brain: There is no acute intracranial hemorrhage, extra-axial fluid collection, or acute infarct There is background parenchymal volume loss with prominence of the ventricular system and extra-axial CSF spaces. The brainstem is normal in morphology. There are scattered small foci of FLAIR signal abnormality in the supratentorial white matter likely reflecting mild for age chronic small-vessel ischemic change. There is no acute or suspicious parenchymal signal abnormality. The pituitary and suprasellar region are normal. There is no mass lesion or abnormal enhancement. There is no mass effect or midline shift. Vascular: Normal flow voids. Skull and upper cervical spine: There is no suspicious calvarial signal abnormality. The cervical spine is assessed on the separately dictated spine MRI. Sinuses/Orbits: The paranasal sinuses are clear. Bilateral lens implants are in place. The globes and orbits are otherwise unremarkable. Other: None. IMPRESSION: No acute  intracranial pathology to explain the patient's symptoms. Electronically Signed   By: Lesia Hausen M.D.   On: 12/10/2022 13:10   MR CERVICAL SPINE W WO CONTRAST  Result Date: 12/10/2022 CLINICAL DATA:  Worsening lower extremity weakness now with relative ataxia since upper respiratory infection in February. 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 EXAM: MRI CERVICAL, THORACIC AND LUMBAR SPINE WITHOUT AND WITH CONTRAST TECHNIQUE: Multiplanar and multiecho pulse sequences of the cervical spine, to include the craniocervical junction and cervicothoracic junction, and thoracic and lumbar spine, were obtained without and with intravenous contrast. CONTRAST:  6mL GADAVIST GADOBUTROL 1 MMOL/ML IV SOLN COMPARISON:  CT abdomen/pelvis 11/09/2022, cervical spine CT and lumbar radiographs 01/10/2015 FINDINGS: MRI CERVICAL SPINE FINDINGS Alignment: There is unchanged mild anterolisthesis of C5 on C6. There is straightening of the normal cervical lordosis. Vertebrae: A type 2 dens fracture is again seen, similar in alignment to the study from 2016 consistent with chronic fracture. Edema in the adjacent C2 vertebral body and right C1 and C2 lateral masses is favored degenerative in nature. Otherwise, vertebral body heights are preserved. There  is no evidence of new acute fracture. There is mild degenerative endplate edema at Z6-X0 and C7-T1 Cord: Normal in signal and morphology. There is no abnormal enhancement. Posterior Fossa, vertebral arteries, paraspinal tissues: The posterior fossa is assessed on the separately dictated brain MRI. The vertebral artery flow voids are normal. The paraspinal soft tissues are unremarkable. Disc levels: C2-C3: Left worse than right facet arthropathy without significant spinal canal or neural foraminal stenosis C3-C4: There is mild endplate spurring and facet arthropathy resulting mild right and no significant left neural  foraminal stenosis and no significant spinal canal stenosis C4-C5: There is left worse than right endplate spurring and facet arthropathy resulting in severe left and mild right neural foraminal stenosis without significant spinal canal stenosis C5-C6: There is anterolisthesis with uncovering of the disc posteriorly and associated uncovertebral and facet arthropathy, worse on the right, but no significant spinal canal or neural foraminal stenosis C6-C7: Mild endplate spurring and facet arthropathy without significant spinal canal or neural foraminal stenosis C7-T1: No significant spinal canal or neural foraminal stenosis. MRI THORACIC SPINE FINDINGS Alignment:  Normal. Vertebrae: There is mild compression deformity of the T1 vertebral body with proximally 10% loss of vertebral body height. There is mild marrow edema and enhancement along the superior endplate. There is no endplate irregularity or destruction. There is no signal abnormality or enhancement the intervening disc space. The loss of height appears similar to the CT cervical spine from 2016 and is favored to reflect a chronic compression fracture with a superimposed Schmorl's node. The other vertebral body heights are preserved. Background marrow signal is normal. There is mild degenerative endplate marrow edema in the mid and lower thoracic spine. There is no suspicious marrow signal abnormality. Cord:  Normal in signal and morphology without abnormal enhancement. Paraspinal and other soft tissues: Unremarkable. Disc levels: There is disc desiccation and narrowing in the mid and lower thoracic spine. There are small disc protrusions in the mid and lower thoracic spine without significant spinal canal stenosis. There is overall mild facet arthropathy without high-grade neural foraminal stenosis. There is no evidence of cord or nerve root impingement. MRI LUMBAR SPINE FINDINGS Segmentation: Standard; the lowest formed disc space is designated L5-S1.  Alignment:  There is levocurvature centered at L2-L3. Vertebrae: Vertebral body heights are preserved. Background marrow signal is normal. There is edema and patchy enhancement along the L1-L2 disc space, eccentric to the left. There is no endplate irregularity or destruction. There is no signal abnormality or enhancement in the intervening disc space. There is no other marrow edema or enhancement. Conus medullaris and cauda equina: Conus extends to the L1 level. Conus and cauda equina appear normal. There is no abnormal thickening or enhancement of the cauda equina nerve roots to suggest Guillain-Barre syndrome. Paraspinal and other soft tissues: A left renal cyst is noted requiring no specific imaging follow-up. The bladder is distended. The paraspinal soft tissues are unremarkable. Disc levels: T12-L1: There is a minimal disc bulge without significant spinal canal or neural foraminal stenosis L1-L2: There is a diffuse disc bulge, degenerative endplate spurring, and mild bilateral facet arthropathy resulting in moderate spinal canal stenosis with bilateral subarticular zone narrowing and no significant neural foraminal stenosis L2-L3: There is a diffuse disc bulge, degenerative endplate spurring, and mild bilateral facet arthropathy resulting in severe spinal canal stenosis with effacement of the thecal sac and compression of the cauda equina nerve roots, and mild bilateral neural foraminal stenosis L3-L4: There is a diffuse disc bulge eccentric to  the right, degenerative endplate spurring, and mild facet arthropathy resulting in moderate spinal canal stenosis with right worse than left subarticular zone narrowing and mild bilateral neural foraminal stenosis L4-L5: There is a diffuse disc bulge, degenerative endplate spurring, and advanced bilateral facet arthropathy resulting in severe spinal canal stenosis with effacement of the thecal sac and compression of the cauda equina nerve roots, and moderate right and  mild left neural foraminal stenosis L5-S1: There is a diffuse disc bulge eccentric to the right and advanced left and moderate right facet arthropathy resulting in right subarticular zone effacement with likely impingement of the traversing right S1 nerve root, and severe right worse than left neural foraminal stenosis. IMPRESSION: 1. No abnormal signal or enhancement involving the spinal cord or cauda equina nerve roots. 2. Remote nonunited type 2 dens fracture. Edema and enhancement in the adjacent C2 vertebral body and right C1 and C2 lateral masses is favored degenerative/reactive in nature, with infection or acute injury considered less likely. 3. 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 No other high-grade neural foraminal stenosis, and no significant spinal canal stenosis at any level. 4. Mild compression deformity of the T1 vertebral body appears similar to the CT cervical spine from 2016. Mild marrow edema along the superior endplate is favored degenerative in nature and possibly related to a superimposed Schmorl's node; however, acute injury superimposed on pre-existing fracture is not entirely excluded. 5. Overall mild degenerative change of the thoracic spine without high-grade spinal canal or neural foraminal stenosis. 6. 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 7. Degenerative endplate edema and enhancement at L1-L2, eccentric to the left. No endplate irregularity or destruction to suggest infection. 8. Right subarticular zone effacement with impingement of the traversing right S1 nerve root, and severe right worse than left neural foraminal stenosis at L5-S1. 9. Distended urinary bladder. Electronically Signed   By: Lesia Hausen M.D.   On: 12/10/2022 13:04   MR THORACIC SPINE W WO  CONTRAST  Result Date: 12/10/2022 CLINICAL DATA:  Worsening lower extremity weakness now with relative ataxia since upper respiratory infection in February. 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 EXAM: MRI CERVICAL, THORACIC AND LUMBAR SPINE WITHOUT AND WITH CONTRAST TECHNIQUE: Multiplanar and multiecho pulse sequences of the cervical spine, to include the craniocervical junction and cervicothoracic junction, and thoracic and lumbar spine, were obtained without and with intravenous contrast. CONTRAST:  6mL GADAVIST GADOBUTROL 1 MMOL/ML IV SOLN COMPARISON:  CT abdomen/pelvis 11/09/2022, cervical spine CT and lumbar radiographs 01/10/2015 FINDINGS: MRI CERVICAL SPINE FINDINGS Alignment: There is unchanged mild anterolisthesis of C5 on C6. There is straightening of the normal cervical lordosis. Vertebrae: A type 2 dens fracture is again seen, similar in alignment to the study from 2016 consistent with chronic fracture. Edema in the adjacent C2 vertebral body and right C1 and C2 lateral masses is favored degenerative in nature. Otherwise, vertebral body heights are preserved. There is no evidence of new acute fracture. There is mild degenerative endplate edema at Z6-X0 and C7-T1 Cord: Normal in signal and morphology. There is no abnormal enhancement. Posterior Fossa, vertebral arteries, paraspinal tissues: The posterior fossa is assessed on the separately dictated brain MRI. The vertebral artery flow voids are normal. The paraspinal soft tissues are unremarkable. Disc levels: C2-C3: Left worse than right facet  arthropathy without significant spinal canal or neural foraminal stenosis C3-C4: There is mild endplate spurring and facet arthropathy resulting mild right and no significant left neural foraminal stenosis and no significant spinal canal stenosis C4-C5: There is left worse than right endplate spurring and facet arthropathy resulting in  severe left and mild right neural foraminal stenosis without significant spinal canal stenosis C5-C6: There is anterolisthesis with uncovering of the disc posteriorly and associated uncovertebral and facet arthropathy, worse on the right, but no significant spinal canal or neural foraminal stenosis C6-C7: Mild endplate spurring and facet arthropathy without significant spinal canal or neural foraminal stenosis C7-T1: No significant spinal canal or neural foraminal stenosis. MRI THORACIC SPINE FINDINGS Alignment:  Normal. Vertebrae: There is mild compression deformity of the T1 vertebral body with proximally 10% loss of vertebral body height. There is mild marrow edema and enhancement along the superior endplate. There is no endplate irregularity or destruction. There is no signal abnormality or enhancement the intervening disc space. The loss of height appears similar to the CT cervical spine from 2016 and is favored to reflect a chronic compression fracture with a superimposed Schmorl's node. The other vertebral body heights are preserved. Background marrow signal is normal. There is mild degenerative endplate marrow edema in the mid and lower thoracic spine. There is no suspicious marrow signal abnormality. Cord:  Normal in signal and morphology without abnormal enhancement. Paraspinal and other soft tissues: Unremarkable. Disc levels: There is disc desiccation and narrowing in the mid and lower thoracic spine. There are small disc protrusions in the mid and lower thoracic spine without significant spinal canal stenosis. There is overall mild facet arthropathy without high-grade neural foraminal stenosis. There is no evidence of cord or nerve root impingement. MRI LUMBAR SPINE FINDINGS Segmentation: Standard; the lowest formed disc space is designated L5-S1. Alignment:  There is levocurvature centered at L2-L3. Vertebrae: Vertebral body heights are preserved. Background marrow signal is normal. There is edema and  patchy enhancement along the L1-L2 disc space, eccentric to the left. There is no endplate irregularity or destruction. There is no signal abnormality or enhancement in the intervening disc space. There is no other marrow edema or enhancement. Conus medullaris and cauda equina: Conus extends to the L1 level. Conus and cauda equina appear normal. There is no abnormal thickening or enhancement of the cauda equina nerve roots to suggest Guillain-Barre syndrome. Paraspinal and other soft tissues: A left renal cyst is noted requiring no specific imaging follow-up. The bladder is distended. The paraspinal soft tissues are unremarkable. Disc levels: T12-L1: There is a minimal disc bulge without significant spinal canal or neural foraminal stenosis L1-L2: There is a diffuse disc bulge, degenerative endplate spurring, and mild bilateral facet arthropathy resulting in moderate spinal canal stenosis with bilateral subarticular zone narrowing and no significant neural foraminal stenosis L2-L3: There is a diffuse disc bulge, degenerative endplate spurring, and mild bilateral facet arthropathy resulting in severe spinal canal stenosis with effacement of the thecal sac and compression of the cauda equina nerve roots, and mild bilateral neural foraminal stenosis L3-L4: There is a diffuse disc bulge eccentric to the right, degenerative endplate spurring, and mild facet arthropathy resulting in moderate spinal canal stenosis with right worse than left subarticular zone narrowing and mild bilateral neural foraminal stenosis L4-L5: There is a diffuse disc bulge, degenerative endplate spurring, and advanced bilateral facet arthropathy resulting in severe spinal canal stenosis with effacement of the thecal sac and compression of the cauda equina nerve roots, and moderate  right and mild left neural foraminal stenosis L5-S1: There is a diffuse disc bulge eccentric to the right and advanced left and moderate right facet arthropathy  resulting in right subarticular zone effacement with likely impingement of the traversing right S1 nerve root, and severe right worse than left neural foraminal stenosis. IMPRESSION: 1. No abnormal signal or enhancement involving the spinal cord or cauda equina nerve roots. 2. Remote nonunited type 2 dens fracture. Edema and enhancement in the adjacent C2 vertebral body and right C1 and C2 lateral masses is favored degenerative/reactive in nature, with infection or acute injury considered less likely. 3. 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 No other high-grade neural foraminal stenosis, and no significant spinal canal stenosis at any level. 4. Mild compression deformity of the T1 vertebral body appears similar to the CT cervical spine from 2016. Mild marrow edema along the superior endplate is favored degenerative in nature and possibly related to a superimposed Schmorl's node; however, acute injury superimposed on pre-existing fracture is not entirely excluded. 5. Overall mild degenerative change of the thoracic spine without high-grade spinal canal or neural foraminal stenosis. 6. 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 7. Degenerative endplate edema and enhancement at L1-L2, eccentric to the left. No endplate irregularity or destruction to suggest infection. 8. Right subarticular zone effacement with impingement of the traversing right S1 nerve root, and severe right worse than left neural foraminal stenosis at L5-S1. 9. Distended urinary bladder. Electronically Signed   By: Lesia Hausen M.D.   On: 12/10/2022 13:04   MR Lumbar Spine W Wo Contrast  Result Date: 12/10/2022 CLINICAL DATA:  Worsening lower extremity weakness now with relative ataxia since upper respiratory infection in February. 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 EXAM: MRI CERVICAL, THORACIC AND LUMBAR SPINE WITHOUT AND WITH CONTRAST TECHNIQUE: Multiplanar and multiecho pulse sequences of the cervical spine, to include the craniocervical junction and cervicothoracic junction, and thoracic and lumbar spine, were obtained without and with intravenous contrast. CONTRAST:  6mL GADAVIST GADOBUTROL 1 MMOL/ML IV SOLN COMPARISON:  CT abdomen/pelvis 11/09/2022, cervical spine CT and lumbar radiographs 01/10/2015 FINDINGS: MRI CERVICAL SPINE FINDINGS Alignment: There is unchanged mild anterolisthesis of C5 on C6. There is straightening of the normal cervical lordosis. Vertebrae: A type 2 dens fracture is again seen, similar in alignment to the study from 2016 consistent with chronic fracture. Edema in the adjacent C2 vertebral body and right C1 and C2 lateral masses is favored degenerative in nature. Otherwise, vertebral body heights are preserved. There is no evidence of new acute fracture. There is mild degenerative endplate edema at Z6-X0 and C7-T1 Cord: Normal in signal and morphology. There is no abnormal enhancement. Posterior Fossa, vertebral arteries, paraspinal tissues: The posterior fossa is assessed on the separately dictated brain MRI. The vertebral artery flow voids are normal. The paraspinal soft tissues are unremarkable. Disc levels: C2-C3: Left worse than right facet arthropathy without significant spinal canal or neural foraminal stenosis C3-C4: There is mild endplate spurring and facet arthropathy resulting mild right and no significant left neural foraminal stenosis and no significant spinal canal stenosis C4-C5: There is left worse than right endplate spurring and facet arthropathy resulting in severe left and mild right neural foraminal stenosis without significant spinal canal stenosis C5-C6: There is anterolisthesis with  uncovering of the disc posteriorly and  associated uncovertebral and facet arthropathy, worse on the right, but no significant spinal canal or neural foraminal stenosis C6-C7: Mild endplate spurring and facet arthropathy without significant spinal canal or neural foraminal stenosis C7-T1: No significant spinal canal or neural foraminal stenosis. MRI THORACIC SPINE FINDINGS Alignment:  Normal. Vertebrae: There is mild compression deformity of the T1 vertebral body with proximally 10% loss of vertebral body height. There is mild marrow edema and enhancement along the superior endplate. There is no endplate irregularity or destruction. There is no signal abnormality or enhancement the intervening disc space. The loss of height appears similar to the CT cervical spine from 2016 and is favored to reflect a chronic compression fracture with a superimposed Schmorl's node. The other vertebral body heights are preserved. Background marrow signal is normal. There is mild degenerative endplate marrow edema in the mid and lower thoracic spine. There is no suspicious marrow signal abnormality. Cord:  Normal in signal and morphology without abnormal enhancement. Paraspinal and other soft tissues: Unremarkable. Disc levels: There is disc desiccation and narrowing in the mid and lower thoracic spine. There are small disc protrusions in the mid and lower thoracic spine without significant spinal canal stenosis. There is overall mild facet arthropathy without high-grade neural foraminal stenosis. There is no evidence of cord or nerve root impingement. MRI LUMBAR SPINE FINDINGS Segmentation: Standard; the lowest formed disc space is designated L5-S1. Alignment:  There is levocurvature centered at L2-L3. Vertebrae: Vertebral body heights are preserved. Background marrow signal is normal. There is edema and patchy enhancement along the L1-L2 disc space, eccentric to the left. There is no endplate irregularity or destruction. There is no signal abnormality or enhancement in  the intervening disc space. There is no other marrow edema or enhancement. Conus medullaris and cauda equina: Conus extends to the L1 level. Conus and cauda equina appear normal. There is no abnormal thickening or enhancement of the cauda equina nerve roots to suggest Guillain-Barre syndrome. Paraspinal and other soft tissues: A left renal cyst is noted requiring no specific imaging follow-up. The bladder is distended. The paraspinal soft tissues are unremarkable. Disc levels: T12-L1: There is a minimal disc bulge without significant spinal canal or neural foraminal stenosis L1-L2: There is a diffuse disc bulge, degenerative endplate spurring, and mild bilateral facet arthropathy resulting in moderate spinal canal stenosis with bilateral subarticular zone narrowing and no significant neural foraminal stenosis L2-L3: There is a diffuse disc bulge, degenerative endplate spurring, and mild bilateral facet arthropathy resulting in severe spinal canal stenosis with effacement of the thecal sac and compression of the cauda equina nerve roots, and mild bilateral neural foraminal stenosis L3-L4: There is a diffuse disc bulge eccentric to the right, degenerative endplate spurring, and mild facet arthropathy resulting in moderate spinal canal stenosis with right worse than left subarticular zone narrowing and mild bilateral neural foraminal stenosis L4-L5: There is a diffuse disc bulge, degenerative endplate spurring, and advanced bilateral facet arthropathy resulting in severe spinal canal stenosis with effacement of the thecal sac and compression of the cauda equina nerve roots, and moderate right and mild left neural foraminal stenosis L5-S1: There is a diffuse disc bulge eccentric to the right and advanced left and moderate right facet arthropathy resulting in right subarticular zone effacement with likely impingement of the traversing right S1 nerve root, and severe right worse than left neural foraminal stenosis.  IMPRESSION: 1. No abnormal signal or enhancement involving the spinal cord or cauda equina  nerve roots. 2. Remote nonunited type 2 dens fracture. Edema and enhancement in the adjacent C2 vertebral body and right C1 and C2 lateral masses is favored degenerative/reactive in nature, with infection or acute injury considered less likely. 3. 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 No other high-grade neural foraminal stenosis, and no significant spinal canal stenosis at any level. 4. Mild compression deformity of the T1 vertebral body appears similar to the CT cervical spine from 2016. Mild marrow edema along the superior endplate is favored degenerative in nature and possibly related to a superimposed Schmorl's node; however, acute injury superimposed on pre-existing fracture is not entirely excluded. 5. Overall mild degenerative change of the thoracic spine without high-grade spinal canal or neural foraminal stenosis. 6. 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 7. Degenerative endplate edema and enhancement at L1-L2, eccentric to the left. No endplate irregularity or destruction to suggest infection. 8. Right subarticular zone effacement with impingement of the traversing right S1 nerve root, and severe right worse than left neural foraminal stenosis at L5-S1. 9. Distended urinary bladder. Electronically Signed   By: Lesia Hausen M.D.   On: 12/10/2022 13:04   US SCROTUM W/DOPPLER  Result Date: 12/10/2022 CLINICAL DATA:  Right testicular pain. EXAM: SCROTAL ULTRASOUND DOPPLER ULTRASOUND OF THE TESTICLES TECHNIQUE: Complete ultrasound examination of the testicles, epididymis, and other scrotal structures was performed. Color and spectral Doppler ultrasound were also utilized to evaluate blood flow to the testicles.  COMPARISON:  None Available. FINDINGS: Right testicle Measurements: 4.2 x 2.7 x 2.6 cm. No mass or microlithiasis visualized. Left testicle Measurements: 3.7 x 2.6 x 2.0 cm. No mass or microlithiasis visualized. There is noted a complex fluid collection measuring 4.3 x 2.6 x 2.0 cm medial and superior to the left testicle concerning for possible abscess. Right epididymis: Enlarged and hypervascular suggesting epididymitis. Left epididymis:  Normal in size and appearance. Hydrocele:  Bilateral hydroceles are noted, right greater than left. Varicocele:  Mild left varicocele is noted. Pulsed Doppler interrogation of both testes demonstrates normal low resistance arterial and venous waveforms bilaterally. IMPRESSION: No evidence of testicular mass or torsion. Probable right epididymitis. 4.3 x 2.6 x 2.0 cm complex fluid collection is seen medial and superior to left testicle concerning for possible abscess. Bilateral hydroceles are noted, right greater than left. Electronically Signed   By: Lupita Raider M.D.   On: 12/10/2022 11:40   CT Head Wo Contrast  Result Date: 12/10/2022 CLINICAL DATA:  Neuro deficit with stroke suspected EXAM: CT HEAD WITHOUT CONTRAST TECHNIQUE: Contiguous axial images were obtained from the base of the skull through the vertex without intravenous contrast. RADIATION DOSE REDUCTION: This exam was performed according to the departmental dose-optimization program which includes automated exposure control, adjustment of the mA and/or kV according to patient size and/or use of iterative reconstruction technique. COMPARISON:  05/16/2015 FINDINGS: Brain: No evidence of acute infarction, hemorrhage, hydrocephalus, extra-axial collection or mass lesion/mass effect. Generalized cerebral volume loss since prior. Vascular: No hyperdense vessel or unexpected calcification. Skull: Normal. Negative for fracture or focal lesion. Sinuses/Orbits: No acute finding. IMPRESSION: Aging brain without acute or  reversible finding Electronically Signed   By: Tiburcio Pea M.D.   On: 12/10/2022 04:12

## 2022-12-22 ENCOUNTER — Inpatient Hospital Stay: Payer: 59 | Admitting: Internal Medicine

## 2022-12-22 DIAGNOSIS — E038 Other specified hypothyroidism: Secondary | ICD-10-CM | POA: Diagnosis not present

## 2022-12-22 DIAGNOSIS — N451 Epididymitis: Secondary | ICD-10-CM | POA: Diagnosis not present

## 2022-12-22 DIAGNOSIS — M4800 Spinal stenosis, site unspecified: Secondary | ICD-10-CM | POA: Diagnosis not present

## 2022-12-22 DIAGNOSIS — M1909 Primary osteoarthritis, other specified site: Secondary | ICD-10-CM | POA: Diagnosis not present

## 2022-12-22 DIAGNOSIS — D508 Other iron deficiency anemias: Secondary | ICD-10-CM | POA: Diagnosis not present

## 2022-12-22 DIAGNOSIS — M6281 Muscle weakness (generalized): Secondary | ICD-10-CM | POA: Diagnosis not present

## 2022-12-22 DIAGNOSIS — I10 Essential (primary) hypertension: Secondary | ICD-10-CM | POA: Diagnosis not present

## 2022-12-23 DIAGNOSIS — I10 Essential (primary) hypertension: Secondary | ICD-10-CM | POA: Diagnosis not present

## 2022-12-24 DIAGNOSIS — M1909 Primary osteoarthritis, other specified site: Secondary | ICD-10-CM | POA: Diagnosis not present

## 2022-12-24 DIAGNOSIS — M4800 Spinal stenosis, site unspecified: Secondary | ICD-10-CM | POA: Diagnosis not present

## 2022-12-24 DIAGNOSIS — I10 Essential (primary) hypertension: Secondary | ICD-10-CM | POA: Diagnosis not present

## 2022-12-24 DIAGNOSIS — M6281 Muscle weakness (generalized): Secondary | ICD-10-CM | POA: Diagnosis not present

## 2022-12-24 DIAGNOSIS — R279 Unspecified lack of coordination: Secondary | ICD-10-CM | POA: Diagnosis not present

## 2022-12-24 DIAGNOSIS — D508 Other iron deficiency anemias: Secondary | ICD-10-CM | POA: Diagnosis not present

## 2022-12-24 DIAGNOSIS — M6259 Muscle wasting and atrophy, not elsewhere classified, multiple sites: Secondary | ICD-10-CM | POA: Diagnosis not present

## 2022-12-24 DIAGNOSIS — R2681 Unsteadiness on feet: Secondary | ICD-10-CM | POA: Diagnosis not present

## 2022-12-24 DIAGNOSIS — R29898 Other symptoms and signs involving the musculoskeletal system: Secondary | ICD-10-CM | POA: Diagnosis not present

## 2022-12-24 DIAGNOSIS — N451 Epididymitis: Secondary | ICD-10-CM | POA: Diagnosis not present

## 2022-12-24 DIAGNOSIS — E038 Other specified hypothyroidism: Secondary | ICD-10-CM | POA: Diagnosis not present

## 2022-12-29 DIAGNOSIS — M6259 Muscle wasting and atrophy, not elsewhere classified, multiple sites: Secondary | ICD-10-CM | POA: Diagnosis not present

## 2022-12-29 DIAGNOSIS — D508 Other iron deficiency anemias: Secondary | ICD-10-CM | POA: Diagnosis not present

## 2022-12-29 DIAGNOSIS — M1909 Primary osteoarthritis, other specified site: Secondary | ICD-10-CM | POA: Diagnosis not present

## 2022-12-29 DIAGNOSIS — E038 Other specified hypothyroidism: Secondary | ICD-10-CM | POA: Diagnosis not present

## 2022-12-29 DIAGNOSIS — R279 Unspecified lack of coordination: Secondary | ICD-10-CM | POA: Diagnosis not present

## 2022-12-29 DIAGNOSIS — R2681 Unsteadiness on feet: Secondary | ICD-10-CM | POA: Diagnosis not present

## 2022-12-29 DIAGNOSIS — I10 Essential (primary) hypertension: Secondary | ICD-10-CM | POA: Diagnosis not present

## 2022-12-29 DIAGNOSIS — R29898 Other symptoms and signs involving the musculoskeletal system: Secondary | ICD-10-CM | POA: Diagnosis not present

## 2022-12-29 DIAGNOSIS — N451 Epididymitis: Secondary | ICD-10-CM | POA: Diagnosis not present

## 2022-12-29 DIAGNOSIS — M6281 Muscle weakness (generalized): Secondary | ICD-10-CM | POA: Diagnosis not present

## 2022-12-29 DIAGNOSIS — M4800 Spinal stenosis, site unspecified: Secondary | ICD-10-CM | POA: Diagnosis not present

## 2022-12-29 NOTE — Progress Notes (Unsigned)
Referring Physician:  Darlin Priestly, MD [address] [place],  [place] [address]  Primary Physician:  Lorre Munroe, NP  History of Present Illness: 12/30/2022 Mr. Ibrahima Holberg has a history of hyperlipidemia, GERD, HTN.   Seen by Dr. 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 PT/OT was recommended.   He is here for follow up.   He has been doing PT at [facility] and feels like his weakness is better. He has intermittent back pain x 10 years. No leg pain.   He is hoping to go home soon. He was told he may have cancer- not sure if in abdomen or prostate. He has scan and f/u next week.   He has history of dens fracture- thinks this happened in his 82s. Had been told about it multiple times in the past. No neck or arm pain. No thoracic pain.   Bowel/Bladder Dysfunction: none  Conservative measures:  Physical therapy: currently doing at [facility] and he does fill like this is helping Multimodal medical therapy including regular antiinflammatories: baclofen  Injections: has not received any epidural steroid injections  Past Surgery: denies  Maclin Guerrette has no symptoms of cervical myelopathy.  The symptoms are causing a significant impact on the patient's life.   Review of Systems:  A 10 point review of systems is negative, except for the pertinent positives and negatives detailed in the HPI.  Past Medical History: Past Medical History:  Diagnosis Date   Bladder stones    Depression    GERD (gastroesophageal reflux disease)    Hyperlipidemia    Hypertension    Iron deficiency anemia    Migraine    Sleep apnea     Past Surgical History: Past Surgical History:  Procedure Laterality Date   COLONOSCOPY     CYSTOSCOPY WITH LITHOLAPAXY N/A 11/26/2022   Procedure: CYSTOSCOPY WITH LITHOLAPAXY;  Surgeon: Sondra Come, MD;  Location: [facility] ORS;  Service: Urology;  Laterality: N/A;    HOLEP-LASER ENUCLEATION OF THE PROSTATE WITH MORCELLATION N/A 11/26/2022   Procedure: HOLEP-LASER ENUCLEATION OF THE PROSTATE WITH MORCELLATION;  Surgeon: Sondra Come, MD;  Location: [facility] ORS;  Service: Urology;  Laterality: N/A;   oral surgery     TONSILLECTOMY      Allergies: Allergies as of 12/30/2022 - Review Complete 12/30/2022  Allergen Reaction Noted   Fluoxetine  04/14/2015   Venlafaxine  04/14/2015    Medications: Outpatient Encounter Medications as of 12/30/2022  Medication Sig   Ascorbic Acid (VITAMIN C) 1000 MG tablet Take 1,000 mg by mouth daily.   atorvastatin (LIPITOR) 20 MG tablet TAKE 1 TABLET BY MOUTH  DAILY AT 6PM   b complex vitamins tablet Take 1 tablet by mouth daily.   baclofen (LIORESAL) 10 MG tablet Take 5 mg by mouth 2 (two) times daily.   Blood Pressure Monitoring (SM WRIST CUFF BP MONITOR) MISC 1 Units by Does not apply route 2 (two) times a week.   buPROPion (WELLBUTRIN XL) 150 MG 24 hr tablet Take 450 mg by mouth daily.   busPIRone (BUSPAR) 10 MG tablet Take 1 tablet (10 mg total) by mouth 2 (two) times daily.   cholecalciferol (VITAMIN D3) 25 MCG (1000 UT) tablet Take 4,000 Units by mouth daily.   Cobalamin Combinations (NEURIVA PLUS PO) Take 1 capsule by mouth daily in the afternoon.   cyanocobalamin 1000 MCG tablet Take 1,000 mcg by mouth daily.  GARLIC PO Take 2 tablets by mouth daily.   Iron, Ferrous Sulfate, 325 (65 Fe) MG TABS Take 1 tablet by mouth daily.   levothyroxine (SYNTHROID) 75 MCG tablet TAKE 1 TABLET BY MOUTH DAILY  BEFORE BREAKFAST   magnesium 30 MG tablet Take 1 tablet (30 mg total) by mouth daily.   mirtazapine (REMERON) 30 MG tablet Take 30 mg by mouth at bedtime.   polyethylene glycol (MIRALAX / GLYCOLAX) 17 g packet Take 17 g by mouth daily.   Saw Palmetto 450 MG CAPS Take 450 mg by mouth daily.   senna-docusate (SENOKOT-S) 8.6-50 MG tablet Take 1 tablet by mouth daily.   traZODone (DESYREL) 150 MG tablet Take 150 mg by  mouth at bedtime.   No facility-administered encounter medications on file as of 12/30/2022.    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 Attention to examination is appropriate.  Respiratory: Patient is breathing without any difficulty.   NEUROLOGICAL:     Awake, alert, oriented to person, place, and time.  Speech is clear and fluent. Fund of knowledge is appropriate.   Cranial Nerves: Pupils equal round and reactive to light.  Facial tone is symmetric.    No posterior cervical tenderness.   No abnormal lesions on exposed skin.   Strength: Side Biceps Triceps Deltoid Interossei Grip Wrist Ext. Wrist Flex.  R L Side Iliopsoas Quads Hamstring PF DF EHL  R L Reflexes are 2+ and symmetric at the biceps, triceps, brachioradialis, patella and achilles.   Hoffman's is absent.  Clonus is not present.   Bilateral upper and lower extremity sensation is intact to light touch.     Gait not tested. He is in a WC.   Medical Decision Making  Imaging: MRI of  cervical, thoracic, and lumbar spine dated 12/10/22:  IMPRESSION: 1. No abnormal signal or enhancement involving the spinal cord or cauda equina nerve roots. 2. Remote nonunited type 2 dens fracture. Edema and enhancement in the adjacent C2 vertebral body and right C1 and C2 lateral masses is favored degenerative/reactive in nature, with infection or acute injury considered less likely. 3. 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 No other high-grade neural  foraminal stenosis, and no significant spinal canal stenosis at any level. 4. Mild compression deformity of the T1 vertebral body appears similar to the CT cervical spine from 2016. Mild marrow edema along the superior endplate is favored degenerative in nature and possibly related to a superimposed Schmorl's node; however, acute injury superimposed on pre-existing fracture is not entirely excluded. 5. Overall mild degenerative change of the thoracic spine without high-grade spinal canal or neural foraminal stenosis. 6. 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 7. Degenerative endplate edema and enhancement at L1-L2, eccentric to the left. No endplate irregularity or destruction to suggest infection. 8. Right subarticular zone effacement with impingement of the traversing right S1 nerve root, and severe right worse than left neural foraminal stenosis at L5-S1. 9. Distended urinary bladder.     Electronically Signed   By: Lesia Hausen M.D.   On: 12/10/2022 13:04  I have personally reviewed the images and agree with the above interpretation.  Assessment and Plan: Mr. Carriker is a pleasant 82 y.o. male has seen improvement since he was in the hospital. He has intermittent LBP x years (none currently). Weakness in legs has improved  with PT.   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   Treatment options discussed with patient and following plan made:   - Continue current PT at SNF. Can consider outpatient PT once he leaves.  - Hopefully, he will see continued improvement.  - If he gets worse, consider referral for lumbar injections.  - Follow up with me in 6-8 weeks for recheck.   I spent a total of 30 minutes in face-to-face and non-face-to-face activities related to this patient's care today including review of outside records, review of imaging, review of symptoms, physical exam, discussion of differential diagnosis, discussion of treatment options, and documentation.   Thank you for involving me in the care of this patient.   Drake Leach PA-C Dept. of Neurosurgery

## 2022-12-30 ENCOUNTER — Ambulatory Visit (INDEPENDENT_AMBULATORY_CARE_PROVIDER_SITE_OTHER): Payer: 59 | Admitting: Orthopedic Surgery

## 2022-12-30 ENCOUNTER — Encounter: Payer: Self-pay | Admitting: Orthopedic Surgery

## 2022-12-30 VITALS — BP 126/76 | Ht 69.0 in | Wt 165.0 lb

## 2022-12-30 DIAGNOSIS — M48061 Spinal stenosis, lumbar region without neurogenic claudication: Secondary | ICD-10-CM | POA: Diagnosis not present

## 2022-12-30 DIAGNOSIS — M47816 Spondylosis without myelopathy or radiculopathy, lumbar region: Secondary | ICD-10-CM | POA: Diagnosis not present

## 2022-12-30 NOTE — Patient Instructions (Signed)
It was so nice to see you today. Thank you so much for coming in.    You have some wear and tear in your back (arthritis) along with spinal stenosis (pressure on the spinal cord). This may be what was causing the symptoms in your legs.   I am so glad you are feeling better! I want you to continue with PT for your back.   If things get worse, we can consider injections and even surgery.   I hope you get good news on your scans next week.   I will see you back in 6-8 weeks. Please do not hesitate to call if you have any questions or concerns. You can also message me in MyChart.   Drake Leach PA-C [phone]

## 2022-12-31 DIAGNOSIS — K5909 Other constipation: Secondary | ICD-10-CM | POA: Diagnosis not present

## 2023-01-04 ENCOUNTER — Ambulatory Visit
Admission: RE | Admit: 2023-01-04 | Discharge: 2023-01-04 | Disposition: A | Discharge status: Home / Self Care | Payer: 59 | Source: Ambulatory Visit | Attending: Internal Medicine | Admitting: Internal Medicine

## 2023-01-04 ENCOUNTER — Ambulatory Visit: Payer: 59 | Admitting: Internal Medicine

## 2023-01-04 DIAGNOSIS — R29898 Other symptoms and signs involving the musculoskeletal system: Secondary | ICD-10-CM | POA: Diagnosis not present

## 2023-01-04 DIAGNOSIS — M4800 Spinal stenosis, site unspecified: Secondary | ICD-10-CM | POA: Diagnosis not present

## 2023-01-04 DIAGNOSIS — R591 Generalized enlarged lymph nodes: Secondary | ICD-10-CM

## 2023-01-04 DIAGNOSIS — J439 Emphysema, unspecified: Secondary | ICD-10-CM | POA: Diagnosis not present

## 2023-01-04 DIAGNOSIS — R2681 Unsteadiness on feet: Secondary | ICD-10-CM | POA: Diagnosis not present

## 2023-01-04 DIAGNOSIS — M6259 Muscle wasting and atrophy, not elsewhere classified, multiple sites: Secondary | ICD-10-CM | POA: Diagnosis not present

## 2023-01-04 DIAGNOSIS — C679 Malignant neoplasm of bladder, unspecified: Secondary | ICD-10-CM | POA: Diagnosis not present

## 2023-01-04 DIAGNOSIS — M6281 Muscle weakness (generalized): Secondary | ICD-10-CM | POA: Diagnosis not present

## 2023-01-04 DIAGNOSIS — R279 Unspecified lack of coordination: Secondary | ICD-10-CM | POA: Diagnosis not present

## 2023-01-04 MED ORDER — IOHEXOL 300 MG/ML  SOLN
75.0000 mL | Freq: Once | INTRAMUSCULAR | Status: AC | PRN
  Administered 2023-01-04: 75 mL via INTRAVENOUS

## 2023-01-06 ENCOUNTER — Inpatient Hospital Stay (HOSPITAL_BASED_OUTPATIENT_CLINIC_OR_DEPARTMENT_OTHER): Payer: 59 | Admitting: Internal Medicine

## 2023-01-06 ENCOUNTER — Encounter: Payer: Self-pay | Admitting: Licensed Clinical Social Worker

## 2023-01-06 ENCOUNTER — Encounter: Payer: Self-pay | Admitting: Internal Medicine

## 2023-01-06 VITALS — BP 132/88 | HR 103 | Temp 98.2°F | Resp 18 | Ht 69.0 in | Wt 181.0 lb

## 2023-01-06 DIAGNOSIS — M6281 Muscle weakness (generalized): Secondary | ICD-10-CM | POA: Diagnosis not present

## 2023-01-06 DIAGNOSIS — M1909 Primary osteoarthritis, other specified site: Secondary | ICD-10-CM | POA: Diagnosis not present

## 2023-01-06 DIAGNOSIS — R591 Generalized enlarged lymph nodes: Secondary | ICD-10-CM | POA: Diagnosis not present

## 2023-01-06 DIAGNOSIS — E038 Other specified hypothyroidism: Secondary | ICD-10-CM | POA: Diagnosis not present

## 2023-01-06 DIAGNOSIS — Z87891 Personal history of nicotine dependence: Secondary | ICD-10-CM | POA: Diagnosis not present

## 2023-01-06 DIAGNOSIS — M4800 Spinal stenosis, site unspecified: Secondary | ICD-10-CM | POA: Diagnosis not present

## 2023-01-06 DIAGNOSIS — C689 Malignant neoplasm of urinary organ, unspecified: Secondary | ICD-10-CM | POA: Diagnosis not present

## 2023-01-06 DIAGNOSIS — D508 Other iron deficiency anemias: Secondary | ICD-10-CM | POA: Diagnosis not present

## 2023-01-06 DIAGNOSIS — D509 Iron deficiency anemia, unspecified: Secondary | ICD-10-CM | POA: Diagnosis not present

## 2023-01-06 DIAGNOSIS — E039 Hypothyroidism, unspecified: Secondary | ICD-10-CM | POA: Diagnosis not present

## 2023-01-06 DIAGNOSIS — N451 Epididymitis: Secondary | ICD-10-CM | POA: Diagnosis not present

## 2023-01-06 DIAGNOSIS — I10 Essential (primary) hypertension: Secondary | ICD-10-CM | POA: Diagnosis not present

## 2023-01-06 DIAGNOSIS — C679 Malignant neoplasm of bladder, unspecified: Secondary | ICD-10-CM | POA: Diagnosis not present

## 2023-01-06 NOTE — Patient Instructions (Signed)
Please start taking oral iron once daily.

## 2023-01-06 NOTE — Progress Notes (Signed)
No concerns. 

## 2023-01-06 NOTE — Progress Notes (Signed)
[facility] Clinical Social Work  Clinical Social Work was referred by medical provider for assessment of psychosocial needs.  Clinical Social Worker attempted to contact patient by phone  to offer support and assess for needs.  CSW left voicemail with contact information and request for a return call.   FA  Mattix Imhof, LCSW  Clinical Social Worker  [facility]          

## 2023-01-06 NOTE — Progress Notes (Addendum)
[facility] CONSULT NOTE  Patient Care Team: Lorre Munroe, NP as PCP - General (Internal Medicine) Lynett Fish, MD as Referring Physician (Psychiatry) Beverlee Nims, LCSW as Social Worker (Psychiatry)   CANCER STAGING   Cancer Staging  Urothelial carcinoma Staging form: Urinary Bladder, AJCC 8th Edition - Clinical stage from 11/26/2022: Stage IIIA (cT4a, cN1, cM0) - Signed by Michaelyn Barter, MD on 12/21/2022 Stage prefix: Initial diagnosis WHO/ISUP grade (low/high): High Grade Histologic grading system: 2 grade system   ASSESSMENT & PLAN:  Joe Hardy 82 y.o. male with pmh of hypertension, IDA, bladder stone was referred to medical oncology for management of urothelial cancer.  # Urothelial cancer, stage IIIA (cT4a N1 M0) -CT abdomen pelvis done for gross hematuria from February 2024 showed multiple bladder stones, enlarged prostate and single pathological lymph node in the lower anterior pelvis measuring 12 mm. No gross lesions in the bladder. PSA from Jan 2024 1.99.  -Status post cystolitholopaxy and HoLEP on 11/26/2022 by Dr. Richardo Hanks.  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  CT chest with contrast done for staging did not show any metastatic disease.  Showed nonspecific 4 mm lung nodule recommend follow-up in 3 months.  He has a stage IIIa urothelial cancer.    -Considering his poor functional status and age, he will not be a good candidate for platinum based neoadjuvant chemotherapy followed by surgery.  Considering the cancer is localized, patient is interested in proceeding with concurrent chemo RT treatment with low doses of gemcitabine 27 mg/m biweekly concurrent with radiation for better tolerance.  Radiation would be done daily 5 days a week.  If he is not able to tolerate, then he would like to focus on palliative care.  But he wants to give it a try.  I will make  referral to radiation oncology.  After radiation oncology consultation, I will arrange for chemotherapy teach.    He will be discharged from [facility] rehab facility today.  Reports improvement in his walking and strength.  Shares transportation issues.  He has 1 friend that he relies on otherwise no other support system.  Referral to social worker to look into transportation support.  # Microcytic anemia -Likely from iron deficiency from prior hematuria. -Patient advised to start iron supplements 325 mg once daily.  He tells me he has ample at home.  # Hypothyroidism-on levothyroxine  Orders Placed This Encounter  Procedures   Ambulatory referral to Social Work    Referral Priority:   Routine    Referral Type:   Consultation    Referral Reason:   Specialty Services Required    Number of Visits Requested:   1   Ambulatory referral to Radiation Oncology    Referral Priority:   Routine    Referral Type:   Consultation    Referral Reason:   Specialty Services Required    Requested Specialty:   Radiation Oncology    Number of Visits Requested:   1   RTC in 2 weeks for MD visit for care coordination.  The total time spent in the appointment was 60 minutes encounter with patients including review of chart and various tests results, discussions about plan of care and coordination of care plan   All questions were answered. The patient knows to call the clinic with any problems, questions or concerns. No barriers to learning was detected.  Michaelyn Barter, MD 4/25/202412:00 PM   HISTORY OF  PRESENTING ILLNESS:  Joe Hardy 82 y.o. male with pmh of hypertension, IDA, bladder stone was referred to medical oncology for management of urothelial cancer.  Patient had multiple ER visits in tMarch 2024.   11/27/2022 - due to concerns for Foley catheter falling and bleeding.  Catheter was replaced and discharged.   11/28/2022 again with the concerns that the Foley came all the way out.  The Foley  was examined and was intact.   12/06/2022-seen by PCP for right scrotal pain.  Urine culture grew Serratia.  Ultrasound showed possible fluid collection around his left testicle could be abscess.  Improved with Bactrim. 12/10/2022 feeling dry mouth and progressive weakness in his bilateral legs with inability to walk.  MRI whole spine showed degenerative changes with no acute findings.  MRI brain did not show any acute changes.  Interval history- Patient was seen today as follow-up to discuss CT chest results.  He will be discharged home from [facility] rehab facility today.  Feels better with walking and improved strength.  Denies any new symptoms.   I have reviewed his chart and materials related to his cancer extensively and collaborated history with the patient. Summary of oncologic history is as follows: Oncology History  Urothelial carcinoma  11/09/2022 Imaging   CT abdomen pelvis done for gross hematuria showed enlarged prostate with mass effect along the base of the bladder and several bladder stone.  Solitary pathologically enlarged lymph node seen along the lower anterior left hemipelvis measuring 12 x 13 mm   11/26/2022 Procedure   S/p cystolitholapaxy and HoLEP on 11/26/2022 by Dr. Joselyn Glassman for multiple bladder stones and enlarged prostate.   Intraoperative findings:  Numerous bladder stones, largest ~3cm, all fragmented to dust and irrigated free Moderate to severe trabeculations, no suspicious lesions, ureteral orifices orthotopic bilaterally Uncomplicated HOLEP, ureteral orifices and verumontanum intact at conclusion of case   11/26/2022 Pathology Results   Surgical pathology from prostate gland showed high-grade urothelial carcinoma with invasion into the muscle and prostatic tissue.  IHC positive for GATA3 and negative for PSA.   11/27/2022 Cancer Staging   Staging form: Urinary Bladder, AJCC 8th Edition - Clinical stage from 11/26/2022: Stage IIIA (cT4a, cN1, cM0) - Signed by  Michaelyn Barter, MD on 12/21/2022 Stage prefix: Initial diagnosis WHO/ISUP grade (low/high): High Grade Histologic grading system: 2 grade system      MEDICAL HISTORY:  Past Medical History:  Diagnosis Date   Bladder stones    Depression    GERD (gastroesophageal reflux disease)    Hyperlipidemia    Hypertension    Iron deficiency anemia    Migraine    Sleep apnea     SURGICAL HISTORY: Past Surgical History:  Procedure Laterality Date   COLONOSCOPY     CYSTOSCOPY WITH LITHOLAPAXY N/A 11/26/2022   Procedure: CYSTOSCOPY WITH LITHOLAPAXY;  Surgeon: Sondra Come, MD;  Location: [facility] ORS;  Service: Urology;  Laterality: N/A;   HOLEP-LASER ENUCLEATION OF THE PROSTATE WITH MORCELLATION N/A 11/26/2022   Procedure: HOLEP-LASER ENUCLEATION OF THE PROSTATE WITH MORCELLATION;  Surgeon: Sondra Come, MD;  Location: [facility] ORS;  Service: Urology;  Laterality: N/A;   oral surgery     TONSILLECTOMY      SOCIAL HISTORY: Social History   Socioeconomic History   Marital status: Divorced    Spouse name: Not on file   Number of children: Not on file   Years of education: Not on file   Highest education level: Bachelor's degree (e.g., BA, AB, BS)  Occupational History   Occupation: retired  Tobacco Use   Smoking status: Former    Packs/day: 1.50    Years: 20.00    Additional pack years: 0.00    Total pack years: 30.00    Types: Cigarettes    Quit date: 05/02/2017    Years since quitting: 5.6   Smokeless tobacco: Former   Tobacco comments:    Starts and stops related to anxiety rather than drinking alcohol  Vaping Use   Vaping Use: Never used  Substance and Sexual Activity   Alcohol use: No    Alcohol/week: 0.0 standard drinks of alcohol    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)   Drug use: Not Currently    Types: Marijuana   Sexual activity: Not Currently  Other Topics Concern   Not on file  Social History Narrative   Not on file   Social Determinants of  Health   Financial Resource Strain: Low Risk  (10/09/2021)   Overall Financial Resource Strain (CARDIA)    Difficulty of Paying Living Expenses: Not hard at all  Food Insecurity: No Food Insecurity (12/21/2022)   Hunger Vital Sign    Worried About Running Out of Food in the Last Year: Never true    Ran Out of Food in the Last Year: Never true  Transportation Needs: Unmet Transportation Needs (12/10/2022)   PRAPARE - Administrator, Civil Service (Medical): Yes    Lack of Transportation (Non-Medical): No  Physical Activity: Inactive (10/09/2021)   Exercise Vital Sign    Days of Exercise per Week: 0 days    Minutes of Exercise per Session: 0 min  Stress: No Stress Concern Present (10/09/2021)   [employer] of Occupational Health - Occupational Stress Questionnaire    Feeling of Stress : Not at all  Social Connections: Moderately Isolated (10/09/2021)   Social Connection and Isolation Panel [NHANES]    Frequency of Communication with Friends and Family: More than three times a week    Frequency of Social Gatherings with Friends and Family: More than three times a week    Attends Religious Services: Never    Database administrator or Organizations: Yes    Attends Banker Meetings: Never    Marital Status: Divorced  Catering manager Violence: Not At Risk (12/21/2022)   Humiliation, Afraid, Rape, and Kick questionnaire    Fear of Current or Ex-Partner: No    Emotionally Abused: No    Physically Abused: No    Sexually Abused: No    FAMILY HISTORY: Family History  Problem Relation Age of Onset   Tuberculosis Mother    Asthma Mother    Emphysema Father    Heart attack Father     ALLERGIES:  is allergic to fluoxetine and venlafaxine.  MEDICATIONS:  Current Outpatient Medications  Medication Sig Dispense Refill   Ascorbic Acid (VITAMIN C) 1000 MG tablet Take 1,000 mg by mouth daily.     atorvastatin (LIPITOR) 20 MG tablet TAKE 1 TABLET BY MOUTH  DAILY AT  6PM 90 tablet 1   b complex vitamins tablet Take 1 tablet by mouth daily.     baclofen (LIORESAL) 10 MG tablet Take 5 mg by mouth 2 (two) times daily.     Blood Pressure Monitoring (SM WRIST CUFF BP MONITOR) MISC 1 Units by Does not apply route 2 (two) times a week. 1 each 0   buPROPion (WELLBUTRIN XL) 150 MG 24 hr tablet Take 450 mg by mouth daily.  busPIRone (BUSPAR) 10 MG tablet Take 1 tablet (10 mg total) by mouth 2 (two) times daily. 180 tablet 1   cholecalciferol (VITAMIN D3) 25 MCG (1000 UT) tablet Take 4,000 Units by mouth daily.     Cobalamin Combinations (NEURIVA PLUS PO) Take 1 capsule by mouth daily in the afternoon.     cyanocobalamin 1000 MCG tablet Take 1,000 mcg by mouth daily.     GARLIC PO Take 2 tablets by mouth daily.     Iron, Ferrous Sulfate, 325 (65 Fe) MG TABS Take 1 tablet by mouth daily.     lactulose, encephalopathy, (CHRONULAC) 10 GM/15ML SOLN SMARTSIG:Milliliter(s) By Mouth     levothyroxine (SYNTHROID) 75 MCG tablet TAKE 1 TABLET BY MOUTH DAILY  BEFORE BREAKFAST 90 tablet 1   magnesium 30 MG tablet Take 1 tablet (30 mg total) by mouth daily. 90 tablet 3   mirtazapine (REMERON) 30 MG tablet Take 30 mg by mouth at bedtime.     polyethylene glycol (MIRALAX / GLYCOLAX) 17 g packet Take 17 g by mouth daily.  0   Saw Palmetto 450 MG CAPS Take 450 mg by mouth daily.     senna-docusate (SENOKOT-S) 8.6-50 MG tablet Take 1 tablet by mouth daily. 30 tablet 0   terazosin (HYTRIN) 5 MG capsule Take 5 mg by mouth at bedtime.     traZODone (DESYREL) 150 MG tablet Take 150 mg by mouth at bedtime.     No current facility-administered medications for this visit.    REVIEW OF SYSTEMS:   Pertinent information mentioned in HPI All other systems were reviewed with the patient and are negative.  PHYSICAL EXAMINATION: ECOG PERFORMANCE STATUS: 2 - Symptomatic, <50% confined to bed  Vitals:   01/06/23 1034  BP: 132/88  Pulse: (!) 103  Resp: 18  Temp: 98.2 F (36.8 C)   SpO2: 96%    Filed Weights   01/06/23 1034  Weight: 181 lb (82.1 kg)     GENERAL:alert, no distress and comfortable SKIN: skin color, texture, turgor are normal, no rashes or significant lesions EYES: normal, conjunctiva are pink and non-injected, sclera clear OROPHARYNX:no exudate, no erythema and lips, buccal mucosa, and tongue normal  NECK: supple, thyroid normal size, non-tender, without nodularity LYMPH:  no palpable lymphadenopathy in the cervical, axillary or inguinal LUNGS: clear to auscultation and percussion with normal breathing effort HEART: regular rate & rhythm and no murmurs and no lower extremity edema ABDOMEN:abdomen soft, non-tender and normal bowel sounds Musculoskeletal:no cyanosis of digits and no clubbing  PSYCH: alert & oriented x 3 with fluent speech NEURO: no focal motor/sensory deficits  LABORATORY DATA:  I have reviewed the data as listed Lab Results  Component Value Date   WBC 8.8 12/13/2022   HGB 9.6 (L) 12/13/2022   HCT 29.7 (L) 12/13/2022   MCV 66.1 (L) 12/13/2022   PLT 300 12/13/2022   Recent Labs    09/23/22 1012 12/10/22 0313 12/11/22 0414 12/13/22 0513  NA 140 133* 138 135  K 3.9 4.1 3.9 3.9  CL 105 103 107 104  CO2 26 22 22 24   GLUCOSE 96 101* 94 105*  BUN 17 15 19 23   CREATININE 1.04 1.06 0.93 0.78  CALCIUM 9.9 9.7 9.5 9.2  GFRNONAA  --  >60 >60 >60  PROT 6.5 6.4* 6.0*  --   ALBUMIN  --  3.3* 2.9*  --   AST 19 17 14*  --   ALT 13 11 10   --   ALKPHOS  --  42 38  --   BILITOT 0.5 0.7 0.7  --   BILIDIR  --  <0.1  --   --   IBILI  --  NOT CALCULATED  --   --     RADIOGRAPHIC STUDIES: I have personally reviewed the radiological images as listed and agreed with the findings in the report. CT Chest W Contrast  Result Date: 01/06/2023 CLINICAL DATA:  urothelial cancer; * Tracking Code: BO * EXAM: CT CHEST WITH CONTRAST TECHNIQUE: Multidetector CT imaging of the chest was performed during intravenous contrast administration.  RADIATION DOSE REDUCTION: This exam was performed according to the departmental dose-optimization program which includes automated exposure control, adjustment of the mA and/or kV according to patient size and/or use of iterative reconstruction technique. CONTRAST:  75mL OMNIPAQUE IOHEXOL 300 MG/ML  SOLN COMPARISON:  CT abdomen and pelvis dated November 09, 2022 FINDINGS: Cardiovascular: Normal heart size. No pericardial effusion. Normal caliber thoracic aorta with mild atherosclerotic disease. Mediastinum/Nodes: Esophagus and thyroid are unremarkable. No enlarged lymph nodes seen in the chest. Lungs/Pleura: Central airways are patent. Mild paraseptal emphysema. No consolidation, pleural effusion or pneumothorax. Small solid pulmonary nodule of the right lower lobe measuring 4 mm on series 4, image 81. Upper Abdomen: Scattered low-attenuation liver lesions which are unchanged when compared with prior CT likely simple cysts. Unchanged simple appearing exophytic cyst of the left kidney, no specific follow-up imaging is recommended. Musculoskeletal: No chest wall abnormality. No acute or significant osseous findings. IMPRESSION: 1. No evidence of metastatic disease in the chest. 2. Nonspecific small solid pulmonary nodule of the right lower lobe measuring 4 mm. Recommend short-term follow-up chest CT 3 months for further evaluation. 3. Aortic Atherosclerosis (ICD10-I70.0) and Emphysema (ICD10-J43.9). Electronically Signed   By: Allegra Lai M.D.   On: 01/06/2023 11:03   MR BRAIN W WO CONTRAST  Result Date: 12/10/2022 CLINICAL DATA:  Worsening lower extremity weakness with relative ataxia following URI in February. EXAM: MRI HEAD WITHOUT AND WITH CONTRAST TECHNIQUE: Multiplanar, multiecho pulse sequences of the brain and surrounding structures were obtained without and with intravenous contrast. CONTRAST:  6mL GADAVIST GADOBUTROL 1 MMOL/ML IV SOLN COMPARISON:  CT head obtained earlier the same day FINDINGS:  Brain: There is no acute intracranial hemorrhage, extra-axial fluid collection, or acute infarct There is background parenchymal volume loss with prominence of the ventricular system and extra-axial CSF spaces. The brainstem is normal in morphology. There are scattered small foci of FLAIR signal abnormality in the supratentorial white matter likely reflecting mild for age chronic small-vessel ischemic change. There is no acute or suspicious parenchymal signal abnormality. The pituitary and suprasellar region are normal. There is no mass lesion or abnormal enhancement. There is no mass effect or midline shift. Vascular: Normal flow voids. Skull and upper cervical spine: There is no suspicious calvarial signal abnormality. The cervical spine is assessed on the separately dictated spine MRI. Sinuses/Orbits: The paranasal sinuses are clear. Bilateral lens implants are in place. The globes and orbits are otherwise unremarkable. Other: None. IMPRESSION: No acute intracranial pathology to explain the patient's symptoms. Electronically Signed   By: Lesia Hausen M.D.   On: 12/10/2022 13:10   MR CERVICAL SPINE W WO CONTRAST  Result Date: 12/10/2022 CLINICAL DATA:  Worsening lower extremity weakness now with relative ataxia since upper respiratory infection in February. 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 EXAM: MRI CERVICAL, THORACIC AND LUMBAR SPINE WITHOUT AND WITH CONTRAST TECHNIQUE: Multiplanar and multiecho pulse sequences of  the cervical spine, to include the craniocervical junction and cervicothoracic junction, and thoracic and lumbar spine, were obtained without and with intravenous contrast. CONTRAST:  6mL GADAVIST GADOBUTROL 1 MMOL/ML IV SOLN COMPARISON:  CT abdomen/pelvis 11/09/2022, cervical spine CT and lumbar radiographs 01/10/2015 FINDINGS: MRI CERVICAL SPINE FINDINGS Alignment: There is unchanged mild anterolisthesis of C5 on C6.  There is straightening of the normal cervical lordosis. Vertebrae: A type 2 dens fracture is again seen, similar in alignment to the study from 2016 consistent with chronic fracture. Edema in the adjacent C2 vertebral body and right C1 and C2 lateral masses is favored degenerative in nature. Otherwise, vertebral body heights are preserved. There is no evidence of new acute fracture. There is mild degenerative endplate edema at Z6-X0 and C7-T1 Cord: Normal in signal and morphology. There is no abnormal enhancement. Posterior Fossa, vertebral arteries, paraspinal tissues: The posterior fossa is assessed on the separately dictated brain MRI. The vertebral artery flow voids are normal. The paraspinal soft tissues are unremarkable. Disc levels: C2-C3: Left worse than right facet arthropathy without significant spinal canal or neural foraminal stenosis C3-C4: There is mild endplate spurring and facet arthropathy resulting mild right and no significant left neural foraminal stenosis and no significant spinal canal stenosis C4-C5: There is left worse than right endplate spurring and facet arthropathy resulting in severe left and mild right neural foraminal stenosis without significant spinal canal stenosis C5-C6: There is anterolisthesis with uncovering of the disc posteriorly and associated uncovertebral and facet arthropathy, worse on the right, but no significant spinal canal or neural foraminal stenosis C6-C7: Mild endplate spurring and facet arthropathy without significant spinal canal or neural foraminal stenosis C7-T1: No significant spinal canal or neural foraminal stenosis. MRI THORACIC SPINE FINDINGS Alignment:  Normal. Vertebrae: There is mild compression deformity of the T1 vertebral body with proximally 10% loss of vertebral body height. There is mild marrow edema and enhancement along the superior endplate. There is no endplate irregularity or destruction. There is no signal abnormality or enhancement the  intervening disc space. The loss of height appears similar to the CT cervical spine from 2016 and is favored to reflect a chronic compression fracture with a superimposed Schmorl's node. The other vertebral body heights are preserved. Background marrow signal is normal. There is mild degenerative endplate marrow edema in the mid and lower thoracic spine. There is no suspicious marrow signal abnormality. Cord:  Normal in signal and morphology without abnormal enhancement. Paraspinal and other soft tissues: Unremarkable. Disc levels: There is disc desiccation and narrowing in the mid and lower thoracic spine. There are small disc protrusions in the mid and lower thoracic spine without significant spinal canal stenosis. There is overall mild facet arthropathy without high-grade neural foraminal stenosis. There is no evidence of cord or nerve root impingement. MRI LUMBAR SPINE FINDINGS Segmentation: Standard; the lowest formed disc space is designated L5-S1. Alignment:  There is levocurvature centered at L2-L3. Vertebrae: Vertebral body heights are preserved. Background marrow signal is normal. There is edema and patchy enhancement along the L1-L2 disc space, eccentric to the left. There is no endplate irregularity or destruction. There is no signal abnormality or enhancement in the intervening disc space. There is no other marrow edema or enhancement. Conus medullaris and cauda equina: Conus extends to the L1 level. Conus and cauda equina appear normal. There is no abnormal thickening or enhancement of the cauda equina nerve roots to suggest Guillain-Barre syndrome. Paraspinal and other soft tissues: A left renal cyst is noted  requiring no specific imaging follow-up. The bladder is distended. The paraspinal soft tissues are unremarkable. Disc levels: T12-L1: There is a minimal disc bulge without significant spinal canal or neural foraminal stenosis L1-L2: There is a diffuse disc bulge, degenerative endplate spurring,  and mild bilateral facet arthropathy resulting in moderate spinal canal stenosis with bilateral subarticular zone narrowing and no significant neural foraminal stenosis L2-L3: There is a diffuse disc bulge, degenerative endplate spurring, and mild bilateral facet arthropathy resulting in severe spinal canal stenosis with effacement of the thecal sac and compression of the cauda equina nerve roots, and mild bilateral neural foraminal stenosis L3-L4: There is a diffuse disc bulge eccentric to the right, degenerative endplate spurring, and mild facet arthropathy resulting in moderate spinal canal stenosis with right worse than left subarticular zone narrowing and mild bilateral neural foraminal stenosis L4-L5: There is a diffuse disc bulge, degenerative endplate spurring, and advanced bilateral facet arthropathy resulting in severe spinal canal stenosis with effacement of the thecal sac and compression of the cauda equina nerve roots, and moderate right and mild left neural foraminal stenosis L5-S1: There is a diffuse disc bulge eccentric to the right and advanced left and moderate right facet arthropathy resulting in right subarticular zone effacement with likely impingement of the traversing right S1 nerve root, and severe right worse than left neural foraminal stenosis. IMPRESSION: 1. No abnormal signal or enhancement involving the spinal cord or cauda equina nerve roots. 2. Remote nonunited type 2 dens fracture. Edema and enhancement in the adjacent C2 vertebral body and right C1 and C2 lateral masses is favored degenerative/reactive in nature, with infection or acute injury considered less likely. 3. 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 No other high-grade neural foraminal stenosis, and no significant spinal canal stenosis at any level. 4. Mild compression deformity of the T1 vertebral body appears similar to the CT  cervical spine from 2016. Mild marrow edema along the superior endplate is favored degenerative in nature and possibly related to a superimposed Schmorl's node; however, acute injury superimposed on pre-existing fracture is not entirely excluded. 5. Overall mild degenerative change of the thoracic spine without high-grade spinal canal or neural foraminal stenosis. 6. 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 7. Degenerative endplate edema and enhancement at L1-L2, eccentric to the left. No endplate irregularity or destruction to suggest infection. 8. Right subarticular zone effacement with impingement of the traversing right S1 nerve root, and severe right worse than left neural foraminal stenosis at L5-S1. 9. Distended urinary bladder. Electronically Signed   By: Lesia Hausen M.D.   On: 12/10/2022 13:04   MR THORACIC SPINE W WO CONTRAST  Result Date: 12/10/2022 CLINICAL DATA:  Worsening lower extremity weakness now with relative ataxia since upper respiratory infection in February. 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 EXAM: MRI CERVICAL, THORACIC AND LUMBAR SPINE WITHOUT AND WITH CONTRAST TECHNIQUE: Multiplanar and multiecho pulse sequences of the cervical spine, to include the craniocervical junction and cervicothoracic junction, and thoracic and lumbar spine, were obtained without and with intravenous contrast. CONTRAST:  6mL GADAVIST GADOBUTROL 1 MMOL/ML IV SOLN COMPARISON:  CT abdomen/pelvis 11/09/2022, cervical spine CT and lumbar radiographs 01/10/2015 FINDINGS: MRI CERVICAL SPINE FINDINGS Alignment: There is unchanged mild anterolisthesis of C5 on C6. There is straightening of the normal cervical  lordosis. Vertebrae: A type 2 dens fracture is again seen, similar in alignment to the study from 2016 consistent with  chronic fracture. Edema in the adjacent C2 vertebral body and right C1 and C2 lateral masses is favored degenerative in nature. Otherwise, vertebral body heights are preserved. There is no evidence of new acute fracture. There is mild degenerative endplate edema at W0-J8 and C7-T1 Cord: Normal in signal and morphology. There is no abnormal enhancement. Posterior Fossa, vertebral arteries, paraspinal tissues: The posterior fossa is assessed on the separately dictated brain MRI. The vertebral artery flow voids are normal. The paraspinal soft tissues are unremarkable. Disc levels: C2-C3: Left worse than right facet arthropathy without significant spinal canal or neural foraminal stenosis C3-C4: There is mild endplate spurring and facet arthropathy resulting mild right and no significant left neural foraminal stenosis and no significant spinal canal stenosis C4-C5: There is left worse than right endplate spurring and facet arthropathy resulting in severe left and mild right neural foraminal stenosis without significant spinal canal stenosis C5-C6: There is anterolisthesis with uncovering of the disc posteriorly and associated uncovertebral and facet arthropathy, worse on the right, but no significant spinal canal or neural foraminal stenosis C6-C7: Mild endplate spurring and facet arthropathy without significant spinal canal or neural foraminal stenosis C7-T1: No significant spinal canal or neural foraminal stenosis. MRI THORACIC SPINE FINDINGS Alignment:  Normal. Vertebrae: There is mild compression deformity of the T1 vertebral body with proximally 10% loss of vertebral body height. There is mild marrow edema and enhancement along the superior endplate. There is no endplate irregularity or destruction. There is no signal abnormality or enhancement the intervening disc space. The loss of height appears similar to the CT cervical spine from 2016 and is favored to reflect a chronic compression fracture with a  superimposed Schmorl's node. The other vertebral body heights are preserved. Background marrow signal is normal. There is mild degenerative endplate marrow edema in the mid and lower thoracic spine. There is no suspicious marrow signal abnormality. Cord:  Normal in signal and morphology without abnormal enhancement. Paraspinal and other soft tissues: Unremarkable. Disc levels: There is disc desiccation and narrowing in the mid and lower thoracic spine. There are small disc protrusions in the mid and lower thoracic spine without significant spinal canal stenosis. There is overall mild facet arthropathy without high-grade neural foraminal stenosis. There is no evidence of cord or nerve root impingement. MRI LUMBAR SPINE FINDINGS Segmentation: Standard; the lowest formed disc space is designated L5-S1. Alignment:  There is levocurvature centered at L2-L3. Vertebrae: Vertebral body heights are preserved. Background marrow signal is normal. There is edema and patchy enhancement along the L1-L2 disc space, eccentric to the left. There is no endplate irregularity or destruction. There is no signal abnormality or enhancement in the intervening disc space. There is no other marrow edema or enhancement. Conus medullaris and cauda equina: Conus extends to the L1 level. Conus and cauda equina appear normal. There is no abnormal thickening or enhancement of the cauda equina nerve roots to suggest Guillain-Barre syndrome. Paraspinal and other soft tissues: A left renal cyst is noted requiring no specific imaging follow-up. The bladder is distended. The paraspinal soft tissues are unremarkable. Disc levels: T12-L1: There is a minimal disc bulge without significant spinal canal or neural foraminal stenosis L1-L2: There is a diffuse disc bulge, degenerative endplate spurring, and mild bilateral facet arthropathy resulting in moderate spinal canal stenosis with bilateral subarticular zone narrowing and no significant neural foraminal  stenosis L2-L3:  There is a diffuse disc bulge, degenerative endplate spurring, and mild bilateral facet arthropathy resulting in severe spinal canal stenosis with effacement of the thecal sac and compression of the cauda equina nerve roots, and mild bilateral neural foraminal stenosis L3-L4: There is a diffuse disc bulge eccentric to the right, degenerative endplate spurring, and mild facet arthropathy resulting in moderate spinal canal stenosis with right worse than left subarticular zone narrowing and mild bilateral neural foraminal stenosis L4-L5: There is a diffuse disc bulge, degenerative endplate spurring, and advanced bilateral facet arthropathy resulting in severe spinal canal stenosis with effacement of the thecal sac and compression of the cauda equina nerve roots, and moderate right and mild left neural foraminal stenosis L5-S1: There is a diffuse disc bulge eccentric to the right and advanced left and moderate right facet arthropathy resulting in right subarticular zone effacement with likely impingement of the traversing right S1 nerve root, and severe right worse than left neural foraminal stenosis. IMPRESSION: 1. No abnormal signal or enhancement involving the spinal cord or cauda equina nerve roots. 2. Remote nonunited type 2 dens fracture. Edema and enhancement in the adjacent C2 vertebral body and right C1 and C2 lateral masses is favored degenerative/reactive in nature, with infection or acute injury considered less likely. 3. 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 No other high-grade neural foraminal stenosis, and no significant spinal canal stenosis at any level. 4. Mild compression deformity of the T1 vertebral body appears similar to the CT cervical spine from 2016. Mild marrow edema along the superior endplate is favored degenerative in nature and possibly related to a superimposed Schmorl's node;  however, acute injury superimposed on pre-existing fracture is not entirely excluded. 5. Overall mild degenerative change of the thoracic spine without high-grade spinal canal or neural foraminal stenosis. 6. 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 7. Degenerative endplate edema and enhancement at L1-L2, eccentric to the left. No endplate irregularity or destruction to suggest infection. 8. Right subarticular zone effacement with impingement of the traversing right S1 nerve root, and severe right worse than left neural foraminal stenosis at L5-S1. 9. Distended urinary bladder. Electronically Signed   By: Lesia Hausen M.D.   On: 12/10/2022 13:04   MR Lumbar Spine W Wo Contrast  Result Date: 12/10/2022 CLINICAL DATA:  Worsening lower extremity weakness now with relative ataxia since upper respiratory infection in February. 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 EXAM: MRI CERVICAL, THORACIC AND LUMBAR SPINE WITHOUT AND WITH CONTRAST TECHNIQUE: Multiplanar and multiecho pulse sequences of the cervical spine, to include the craniocervical junction and cervicothoracic junction, and thoracic and lumbar spine, were obtained without and with intravenous contrast. CONTRAST:  6mL GADAVIST GADOBUTROL 1 MMOL/ML IV SOLN COMPARISON:  CT abdomen/pelvis 11/09/2022, cervical spine CT and lumbar radiographs 01/10/2015 FINDINGS: MRI CERVICAL SPINE FINDINGS Alignment: There is unchanged mild anterolisthesis of C5 on C6. There is straightening of the normal cervical lordosis. Vertebrae: A type 2 dens fracture is again seen, similar in alignment to the study from 2016 consistent with chronic fracture. Edema in the adjacent C2 vertebral body and right C1 and C2 lateral masses is favored degenerative in nature. Otherwise, vertebral body heights  are preserved. There is no evidence of new acute fracture. There is mild degenerative endplate edema at Z6-X0 and C7-T1  Cord: Normal in signal and morphology. There is no abnormal enhancement. Posterior Fossa, vertebral arteries, paraspinal tissues: The posterior fossa is assessed on the separately dictated brain MRI. The vertebral artery flow voids are normal. The paraspinal soft tissues are unremarkable. Disc levels: C2-C3: Left worse than right facet arthropathy without significant spinal canal or neural foraminal stenosis C3-C4: There is mild endplate spurring and facet arthropathy resulting mild right and no significant left neural foraminal stenosis and no significant spinal canal stenosis C4-C5: There is left worse than right endplate spurring and facet arthropathy resulting in severe left and mild right neural foraminal stenosis without significant spinal canal stenosis C5-C6: There is anterolisthesis with uncovering of the disc posteriorly and associated uncovertebral and facet arthropathy, worse on the right, but no significant spinal canal or neural foraminal stenosis C6-C7: Mild endplate spurring and facet arthropathy without significant spinal canal or neural foraminal stenosis C7-T1: No significant spinal canal or neural foraminal stenosis. MRI THORACIC SPINE FINDINGS Alignment:  Normal. Vertebrae: There is mild compression deformity of the T1 vertebral body with proximally 10% loss of vertebral body height. There is mild marrow edema and enhancement along the superior endplate. There is no endplate irregularity or destruction. There is no signal abnormality or enhancement the intervening disc space. The loss of height appears similar to the CT cervical spine from 2016 and is favored to reflect a chronic compression fracture with a superimposed Schmorl's node. The other vertebral body heights are preserved. Background marrow signal is normal. There is mild degenerative endplate marrow edema in the mid  and lower thoracic spine. There is no suspicious marrow signal abnormality. Cord:  Normal in signal and morphology without abnormal enhancement. Paraspinal and other soft tissues: Unremarkable. Disc levels: There is disc desiccation and narrowing in the mid and lower thoracic spine. There are small disc protrusions in the mid and lower thoracic spine without significant spinal canal stenosis. There is overall mild facet arthropathy without high-grade neural foraminal stenosis. There is no evidence of cord or nerve root impingement. MRI LUMBAR SPINE FINDINGS Segmentation: Standard; the lowest formed disc space is designated L5-S1. Alignment:  There is levocurvature centered at L2-L3. Vertebrae: Vertebral body heights are preserved. Background marrow signal is normal. There is edema and patchy enhancement along the L1-L2 disc space, eccentric to the left. There is no endplate irregularity or destruction. There is no signal abnormality or enhancement in the intervening disc space. There is no other marrow edema or enhancement. Conus medullaris and cauda equina: Conus extends to the L1 level. Conus and cauda equina appear normal. There is no abnormal thickening or enhancement of the cauda equina nerve roots to suggest Guillain-Barre syndrome. Paraspinal and other soft tissues: A left renal cyst is noted requiring no specific imaging follow-up. The bladder is distended. The paraspinal soft tissues are unremarkable. Disc levels: T12-L1: There is a minimal disc bulge without significant spinal canal or neural foraminal stenosis L1-L2: There is a diffuse disc bulge, degenerative endplate spurring, and mild bilateral facet arthropathy resulting in moderate spinal canal stenosis with bilateral subarticular zone narrowing and no significant neural foraminal stenosis L2-L3: There is a diffuse disc bulge, degenerative endplate spurring, and mild bilateral facet arthropathy resulting in severe spinal canal stenosis with  effacement of the thecal sac and compression of the cauda equina nerve roots, and mild bilateral neural foraminal stenosis L3-L4: There is a diffuse disc bulge eccentric to the right, degenerative endplate spurring, and mild facet arthropathy resulting in moderate spinal canal stenosis with right  worse than left subarticular zone narrowing and mild bilateral neural foraminal stenosis L4-L5: There is a diffuse disc bulge, degenerative endplate spurring, and advanced bilateral facet arthropathy resulting in severe spinal canal stenosis with effacement of the thecal sac and compression of the cauda equina nerve roots, and moderate right and mild left neural foraminal stenosis L5-S1: There is a diffuse disc bulge eccentric to the right and advanced left and moderate right facet arthropathy resulting in right subarticular zone effacement with likely impingement of the traversing right S1 nerve root, and severe right worse than left neural foraminal stenosis. IMPRESSION: 1. No abnormal signal or enhancement involving the spinal cord or cauda equina nerve roots. 2. Remote nonunited type 2 dens fracture. Edema and enhancement in the adjacent C2 vertebral body and right C1 and C2 lateral masses is favored degenerative/reactive in nature, with infection or acute injury considered less likely. 3. 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 No other high-grade neural foraminal stenosis, and no significant spinal canal stenosis at any level. 4. Mild compression deformity of the T1 vertebral body appears similar to the CT cervical spine from 2016. Mild marrow edema along the superior endplate is favored degenerative in nature and possibly related to a superimposed Schmorl's node; however, acute injury superimposed on pre-existing fracture is not entirely excluded. 5. Overall mild degenerative change of the thoracic spine without high-grade spinal  canal or neural foraminal stenosis. 6. 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 7. Degenerative endplate edema and enhancement at L1-L2, eccentric to the left. No endplate irregularity or destruction to suggest infection. 8. Right subarticular zone effacement with impingement of the traversing right S1 nerve root, and severe right worse than left neural foraminal stenosis at L5-S1. 9. Distended urinary bladder. Electronically Signed   By: Lesia Hausen M.D.   On: 12/10/2022 13:04   US SCROTUM W/DOPPLER  Result Date: 12/10/2022 CLINICAL DATA:  Right testicular pain. EXAM: SCROTAL ULTRASOUND DOPPLER ULTRASOUND OF THE TESTICLES TECHNIQUE: Complete ultrasound examination of the testicles, epididymis, and other scrotal structures was performed. Color and spectral Doppler ultrasound were also utilized to evaluate blood flow to the testicles. COMPARISON:  None Available. FINDINGS: Right testicle Measurements: 4.2 x 2.7 x 2.6 cm. No mass or microlithiasis visualized. Left testicle Measurements: 3.7 x 2.6 x 2.0 cm. No mass or microlithiasis visualized. There is noted a complex fluid collection measuring 4.3 x 2.6 x 2.0 cm medial and superior to the left testicle concerning for possible abscess. Right epididymis: Enlarged and hypervascular suggesting epididymitis. Left epididymis:  Normal in size and appearance. Hydrocele:  Bilateral hydroceles are noted, right greater than left. Varicocele:  Mild left varicocele is noted. Pulsed Doppler interrogation of both testes demonstrates normal low resistance arterial and venous waveforms bilaterally. IMPRESSION: No evidence of testicular mass or torsion. Probable right epididymitis. 4.3 x 2.6 x 2.0 cm complex fluid collection is seen medial and superior to left testicle concerning for possible abscess. Bilateral hydroceles are noted, right greater than left.  Electronically Signed   By: Lupita Raider M.D.   On: 12/10/2022 11:40   CT Head Wo Contrast  Result Date: 12/10/2022 CLINICAL DATA:  Neuro deficit with stroke suspected EXAM: CT HEAD WITHOUT CONTRAST TECHNIQUE: Contiguous axial images were obtained from the base of the skull through the vertex without intravenous contrast. RADIATION DOSE REDUCTION:  This exam was performed according to the departmental dose-optimization program which includes automated exposure control, adjustment of the mA and/or kV according to patient size and/or use of iterative reconstruction technique. COMPARISON:  05/16/2015 FINDINGS: Brain: No evidence of acute infarction, hemorrhage, hydrocephalus, extra-axial collection or mass lesion/mass effect. Generalized cerebral volume loss since prior. Vascular: No hyperdense vessel or unexpected calcification. Skull: Normal. Negative for fracture or focal lesion. Sinuses/Orbits: No acute finding. IMPRESSION: Aging brain without acute or reversible finding Electronically Signed   By: Tiburcio Pea M.D.   On: 12/10/2022 04:12

## 2023-01-10 ENCOUNTER — Telehealth: Payer: Self-pay

## 2023-01-10 ENCOUNTER — Inpatient Hospital Stay: Payer: 59 | Admitting: Internal Medicine

## 2023-01-10 DIAGNOSIS — G4730 Sleep apnea, unspecified: Secondary | ICD-10-CM | POA: Diagnosis not present

## 2023-01-10 DIAGNOSIS — N451 Epididymitis: Secondary | ICD-10-CM | POA: Diagnosis not present

## 2023-01-10 DIAGNOSIS — M5136 Other intervertebral disc degeneration, lumbar region: Secondary | ICD-10-CM | POA: Diagnosis not present

## 2023-01-10 DIAGNOSIS — K219 Gastro-esophageal reflux disease without esophagitis: Secondary | ICD-10-CM | POA: Diagnosis not present

## 2023-01-10 DIAGNOSIS — Z87891 Personal history of nicotine dependence: Secondary | ICD-10-CM | POA: Diagnosis not present

## 2023-01-10 DIAGNOSIS — F32A Depression, unspecified: Secondary | ICD-10-CM | POA: Diagnosis not present

## 2023-01-10 DIAGNOSIS — M4800 Spinal stenosis, site unspecified: Secondary | ICD-10-CM | POA: Diagnosis not present

## 2023-01-10 DIAGNOSIS — M48061 Spinal stenosis, lumbar region without neurogenic claudication: Secondary | ICD-10-CM | POA: Diagnosis not present

## 2023-01-10 DIAGNOSIS — Z9181 History of falling: Secondary | ICD-10-CM | POA: Diagnosis not present

## 2023-01-10 DIAGNOSIS — I10 Essential (primary) hypertension: Secondary | ICD-10-CM | POA: Diagnosis not present

## 2023-01-10 DIAGNOSIS — K5900 Constipation, unspecified: Secondary | ICD-10-CM | POA: Diagnosis not present

## 2023-01-10 DIAGNOSIS — D508 Other iron deficiency anemias: Secondary | ICD-10-CM | POA: Diagnosis not present

## 2023-01-10 DIAGNOSIS — G43909 Migraine, unspecified, not intractable, without status migrainosus: Secondary | ICD-10-CM | POA: Diagnosis not present

## 2023-01-10 DIAGNOSIS — M1909 Primary osteoarthritis, other specified site: Secondary | ICD-10-CM | POA: Diagnosis not present

## 2023-01-10 DIAGNOSIS — E782 Mixed hyperlipidemia: Secondary | ICD-10-CM | POA: Diagnosis not present

## 2023-01-10 DIAGNOSIS — E039 Hypothyroidism, unspecified: Secondary | ICD-10-CM | POA: Diagnosis not present

## 2023-01-10 NOTE — Telephone Encounter (Unsigned)
Copied from CRM [id number]. Topic: General - Other >> Jan 10, 2023  9:54 AM Franchot Heidelberg wrote: Reason for CRM: Pt called reporting that he needs to speak to Dr. Kirtland Bouchard just for a moment prior to his appt, please advise. Says this is important that he speaks to Dr. Kirtland Bouchard prior to 1:30, concerning something that happened with Nicki Reaper last time.    [phone] or [phone]

## 2023-01-10 NOTE — Progress Notes (Deleted)
Subjective:    Patient ID: Joe Hardy, male    DOB: August 14, 1941, 82 y.o.   MRN: [id number]  HPI  Patient presents to clinic today for hospital follow-up.  He presented to the hospital 3/29 with complaint of dry mouth and lower extremity weakness.  Neurology and neurosurgery were consulted.  They felt like his weakness was secondary to deconditioning.  They recommended aggressive PT/OT.  He was discharged on 4/2 to [facility] for rehab.  He was discharged from [facility] on 4/25.  Since that time.  Review of Systems     Past Medical History:  Diagnosis Date   Bladder stones    Depression    GERD (gastroesophageal reflux disease)    Hyperlipidemia    Hypertension    Iron deficiency anemia    Migraine    Sleep apnea     Current Outpatient Medications  Medication Sig Dispense Refill   Ascorbic Acid (VITAMIN C) 1000 MG tablet Take 1,000 mg by mouth daily.     atorvastatin (LIPITOR) 20 MG tablet TAKE 1 TABLET BY MOUTH  DAILY AT 6PM 90 tablet 1   b complex vitamins tablet Take 1 tablet by mouth daily.     baclofen (LIORESAL) 10 MG tablet Take 5 mg by mouth 2 (two) times daily.     Blood Pressure Monitoring (SM WRIST CUFF BP MONITOR) MISC 1 Units by Does not apply route 2 (two) times a week. 1 each 0   buPROPion (WELLBUTRIN XL) 150 MG 24 hr tablet Take 450 mg by mouth daily.     busPIRone (BUSPAR) 10 MG tablet Take 1 tablet (10 mg total) by mouth 2 (two) times daily. 180 tablet 1   cholecalciferol (VITAMIN D3) 25 MCG (1000 UT) tablet Take 4,000 Units by mouth daily.     Cobalamin Combinations (NEURIVA PLUS PO) Take 1 capsule by mouth daily in the afternoon.     cyanocobalamin 1000 MCG tablet Take 1,000 mcg by mouth daily.     GARLIC PO Take 2 tablets by mouth daily.     Iron, Ferrous Sulfate, 325 (65 Fe) MG TABS Take 1 tablet by mouth daily.     lactulose, encephalopathy, (CHRONULAC) 10 GM/15ML SOLN SMARTSIG:Milliliter(s) By Mouth     levothyroxine (SYNTHROID) 75 MCG tablet TAKE 1 TABLET  BY MOUTH DAILY  BEFORE BREAKFAST 90 tablet 1   magnesium 30 MG tablet Take 1 tablet (30 mg total) by mouth daily. 90 tablet 3   mirtazapine (REMERON) 30 MG tablet Take 30 mg by mouth at bedtime.     polyethylene glycol (MIRALAX / GLYCOLAX) 17 g packet Take 17 g by mouth daily.  0   Saw Palmetto 450 MG CAPS Take 450 mg by mouth daily.     senna-docusate (SENOKOT-S) 8.6-50 MG tablet Take 1 tablet by mouth daily. 30 tablet 0   terazosin (HYTRIN) 5 MG capsule Take 5 mg by mouth at bedtime.     traZODone (DESYREL) 150 MG tablet Take 150 mg by mouth at bedtime.     No current facility-administered medications for this visit.    Allergies  Allergen Reactions   Fluoxetine     insomnia   Venlafaxine     difficulty sleeping    Family History  Problem Relation Age of Onset   Tuberculosis Mother    Asthma Mother    Emphysema Father    Heart attack Father     Social History   Socioeconomic History   Marital status: Divorced    Spouse  name: Not on file   Number of children: Not on file   Years of education: Not on file   Highest education level: Bachelor's degree (e.g., BA, AB, BS)  Occupational History   Occupation: retired  Tobacco Use   Smoking status: Former    Packs/day: 1.50    Years: 20.00    Additional pack years: 0.00    Total pack years: 30.00    Types: Cigarettes    Quit date: 05/02/2017    Years since quitting: 5.6   Smokeless tobacco: Former   Tobacco comments:    Starts and stops related to anxiety rather than drinking alcohol  Vaping Use   Vaping Use: Never used  Substance and Sexual Activity   Alcohol use: No    Alcohol/week: 0.0 standard drinks of alcohol    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)   Drug use: Not Currently    Types: Marijuana   Sexual activity: Not Currently  Other Topics Concern   Not on file  Social History Narrative   Not on file   Social Determinants of Health   Financial Resource Strain: Low Risk  (10/09/2021)   Overall  Financial Resource Strain (CARDIA)    Difficulty of Paying Living Expenses: Not hard at all  Food Insecurity: No Food Insecurity (12/21/2022)   Hunger Vital Sign    Worried About Running Out of Food in the Last Year: Never true    Ran Out of Food in the Last Year: Never true  Transportation Needs: Unmet Transportation Needs (12/10/2022)   PRAPARE - Administrator, Civil Service (Medical): Yes    Lack of Transportation (Non-Medical): No  Physical Activity: Inactive (10/09/2021)   Exercise Vital Sign    Days of Exercise per Week: 0 days    Minutes of Exercise per Session: 0 min  Stress: No Stress Concern Present (10/09/2021)   [employer] of Occupational Health - Occupational Stress Questionnaire    Feeling of Stress : Not at all  Social Connections: Moderately Isolated (10/09/2021)   Social Connection and Isolation Panel [NHANES]    Frequency of Communication with Friends and Family: More than three times a week    Frequency of Social Gatherings with Friends and Family: More than three times a week    Attends Religious Services: Never    Database administrator or Organizations: Yes    Attends Banker Meetings: Never    Marital Status: Divorced  Catering manager Violence: Not At Risk (12/21/2022)   Humiliation, Afraid, Rape, and Kick questionnaire    Fear of Current or Ex-Partner: No    Emotionally Abused: No    Physically Abused: No    Sexually Abused: No     Constitutional: Patient reports intermittent headaches.  Denies fever, malaise, fatigue, or abrupt weight changes.  HEENT: Denies eye pain, eye redness, ear pain, ringing in the ears, wax buildup, runny nose, nasal congestion, bloody nose, or sore throat. Respiratory: Denies difficulty breathing, shortness of breath, cough or sputum production.   Cardiovascular: Denies chest pain, chest tightness, palpitations or swelling in the hands or feet.  Gastrointestinal: Patient reports constipation.  Denies  abdominal pain, bloating, diarrhea or blood in the stool.  GU: Denies urgency, frequency, pain with urination, burning sensation, blood in urine, odor or discharge. Musculoskeletal: Denies decrease in range of motion, difficulty with gait, muscle pain or joint pain and swelling.  Skin: Denies redness, rashes, lesions or ulcercations.  Neurological: Denies dizziness, difficulty with memory, difficulty  with speech or problems with balance and coordination.  Psych: Denies anxiety, depression, SI/HI.  No other specific complaints in a complete review of systems (except as listed in HPI above).  Objective:   Physical Exam   There were no vitals taken for this visit. Wt Readings from Last 3 Encounters:  01/06/23 181 lb (82.1 kg)  12/30/22 165 lb (74.8 kg)  12/21/22 165 lb 9.6 oz (75.1 kg)    General: Appears their stated age, well developed, well nourished in NAD. Skin: Warm, dry and intact. No rashes, lesions or ulcerations noted. HEENT: Head: normal shape and size; Eyes: sclera white, no icterus, conjunctiva pink, PERRLA and EOMs intact; Ears: Tm's gray and intact, normal light reflex; Nose: mucosa pink and moist, septum midline; Throat/Mouth: Teeth present, mucosa pink and moist, no exudate, lesions or ulcerations noted.  Neck:  Neck supple, trachea midline. No masses, lumps or thyromegaly present.  Cardiovascular: Normal rate and rhythm. S1,S2 noted.  No murmur, rubs or gallops noted. No JVD or BLE edema. No carotid bruits noted. Pulmonary/Chest: Normal effort and positive vesicular breath sounds. No respiratory distress. No wheezes, rales or ronchi noted.  Abdomen: Soft and nontender. Normal bowel sounds. No distention or masses noted. Liver, spleen and kidneys non palpable. Musculoskeletal: Normal range of motion. No signs of joint swelling. No difficulty with gait.  Neurological: Alert and oriented. Cranial nerves II-XII grossly intact. Coordination normal.  Psychiatric: Mood and affect  normal. Behavior is normal. Judgment and thought content normal.    BMET    Component Value Date/Time   NA 135 12/13/2022 0513   NA 140 04/20/2017 0925   K 3.9 12/13/2022 0513   CL 104 12/13/2022 0513   CO2 24 12/13/2022 0513   GLUCOSE 105 (H) 12/13/2022 0513   BUN 23 12/13/2022 0513   BUN 9 04/20/2017 0925   CREATININE 0.78 12/13/2022 0513   CREATININE 1.04 09/23/2022 1012   CALCIUM 9.2 12/13/2022 0513   GFRNONAA >60 12/13/2022 0513   GFRNONAA 85 03/09/2021 1119   GFRAA 98 03/09/2021 1119    Lipid Panel     Component Value Date/Time   CHOL 196 09/23/2022 1012   CHOL 113 04/20/2017 0925   TRIG 56 09/23/2022 1012   HDL 53 09/23/2022 1012   HDL 26 (L) 04/20/2017 0925   CHOLHDL 3.7 09/23/2022 1012   VLDL 24 08/17/2016 0001   LDLCALC 128 (H) 09/23/2022 1012    CBC    Component Value Date/Time   WBC 8.8 12/13/2022 0513   RBC 4.49 12/13/2022 0513   HGB 9.6 (L) 12/13/2022 0513   HGB 13.0 04/22/2015 0818   HCT 29.7 (L) 12/13/2022 0513   HCT 38.5 04/22/2015 0818   PLT 300 12/13/2022 0513   PLT 257 04/22/2015 0818   MCV 66.1 (L) 12/13/2022 0513   MCV 69 (L) 04/22/2015 0818   MCH 21.4 (L) 12/13/2022 0513   MCHC 32.3 12/13/2022 0513   RDW 16.2 (H) 12/13/2022 0513   RDW 16.0 (H) 04/22/2015 0818   LYMPHSABS 1.9 12/10/2022 0313   LYMPHSABS 2.4 04/22/2015 0818   MONOABS 0.7 12/10/2022 0313   EOSABS 0.1 12/10/2022 0313   EOSABS 0.2 04/22/2015 0818   BASOSABS 0.1 12/10/2022 0313   BASOSABS 0.0 04/22/2015 0818    Hgb A1C Lab Results  Component Value Date   HGBA1C 5.4 09/23/2022           Assessment & Plan:   RTC in 2 months for follow-up of chronic conditions Nicki Reaper, NP

## 2023-01-11 ENCOUNTER — Telehealth: Payer: Self-pay

## 2023-01-11 ENCOUNTER — Encounter: Payer: Self-pay | Admitting: Radiation Oncology

## 2023-01-11 ENCOUNTER — Ambulatory Visit
Admission: RE | Admit: 2023-01-11 | Discharge: 2023-01-11 | Disposition: A | Discharge status: Home / Self Care | Payer: 59 | Source: Ambulatory Visit | Attending: Radiation Oncology | Admitting: Radiation Oncology

## 2023-01-11 VITALS — BP 98/66 | HR 108 | Temp 97.6°F | Resp 16 | Wt 165.0 lb

## 2023-01-11 DIAGNOSIS — Z79890 Hormone replacement therapy (postmenopausal): Secondary | ICD-10-CM | POA: Insufficient documentation

## 2023-01-11 DIAGNOSIS — G4730 Sleep apnea, unspecified: Secondary | ICD-10-CM | POA: Diagnosis not present

## 2023-01-11 DIAGNOSIS — E785 Hyperlipidemia, unspecified: Secondary | ICD-10-CM | POA: Diagnosis not present

## 2023-01-11 DIAGNOSIS — Z79899 Other long term (current) drug therapy: Secondary | ICD-10-CM | POA: Insufficient documentation

## 2023-01-11 DIAGNOSIS — I10 Essential (primary) hypertension: Secondary | ICD-10-CM | POA: Diagnosis not present

## 2023-01-11 DIAGNOSIS — Z87442 Personal history of urinary calculi: Secondary | ICD-10-CM | POA: Insufficient documentation

## 2023-01-11 DIAGNOSIS — Z191 Hormone sensitive malignancy status: Secondary | ICD-10-CM | POA: Diagnosis not present

## 2023-01-11 DIAGNOSIS — C61 Malignant neoplasm of prostate: Secondary | ICD-10-CM | POA: Insufficient documentation

## 2023-01-11 DIAGNOSIS — K219 Gastro-esophageal reflux disease without esophagitis: Secondary | ICD-10-CM | POA: Diagnosis not present

## 2023-01-11 DIAGNOSIS — Z87891 Personal history of nicotine dependence: Secondary | ICD-10-CM | POA: Diagnosis not present

## 2023-01-11 DIAGNOSIS — C689 Malignant neoplasm of urinary organ, unspecified: Secondary | ICD-10-CM

## 2023-01-11 NOTE — Telephone Encounter (Signed)
Called and spoke with the patient about his concerns.  Patient expressed concerns on how Nicki Reaper, NP told him he had cancer.  Per the patient she was looking at the computer and said "you have cancer".  He questioned if she was even able to tell a person that.  I did explain to the patient that she is a medical provider and is able to talk to patients about diagnoses.  He was not happy with his care and wanted to talk to Dr. Althea Charon about the issue. Dr. Althea Charon, Nicki Reaper, NP and myself discussed this patients concerns and how to move forward from this point.  Rescheduled patient to see Dr. Laqueta Due on 01/13/23.  Patient expressed on the phone call that he was not sure how he was going to be if he was told at the Oncologist appointment on Tuesday, 01/11/23, that his cancer was not curable.  He said " I am may ride off into the sunset, I may be very mad, I don't know I might kill myself."  Patients had a angry tone on the phone. I discussed this with the providers.

## 2023-01-11 NOTE — Telephone Encounter (Signed)
Okay for verbal orders as requested 

## 2023-01-11 NOTE — Consult Note (Signed)
NEW PATIENT EVALUATION  Name: Joe Hardy  MRN: [id number]  Date:   01/11/2023     DOB: 01-Mar-1941   This 82 y.o. 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  REFERRING PHYSICIAN: Lorre Munroe, NP  CHIEF COMPLAINT:  Chief Complaint  Patient presents with   urethrial cancer    DIAGNOSIS: The encounter diagnosis was Urothelial carcinoma (HCC).   PREVIOUS INVESTIGATIONS:  Pathology report reviewed Clinical notes reviewed CT scans reviewed  HPI: Patient is an 82 year old male who presented with urinary obstruction and gross hematuria.  Urogram done in February showed enlarged prostate with bladder stones.  He also had a pathologically enlarged node along the anterior left pelvis on CT scan.  Cystoscopy 11/17/2022 showed enlarged prostate bladder stones although there are no bladder abnormalities.  He underwent a HoLEP (laser enucleation of the prostate) and final pathology showed high-grade urothelial carcinoma with invasion into the muscle and prostatic tissue involving 5 to 10% of total tissue.  Initial CT scan showed enlarged prostate with mass effect along the base of the bladder with some bladder wall thickening and trabeculation.  He is seen today for evaluation.  He is doing well he states his stream has improved he is having no abdominal complaints at this time.  PLANNED TREATMENT REGIMEN: Concurrent chemoradiation therapy  PAST MEDICAL HISTORY:  has a past medical history of Bladder stones, Depression, GERD (gastroesophageal reflux disease), Hyperlipidemia, Hypertension, Iron deficiency anemia, Migraine, and Sleep apnea.    PAST SURGICAL HISTORY:  Past Surgical History:  Procedure Laterality Date   COLONOSCOPY     CYSTOSCOPY WITH LITHOLAPAXY N/A 11/26/2022   Procedure: CYSTOSCOPY WITH LITHOLAPAXY;  Surgeon: Sondra Come, MD;  Location: [facility] ORS;  Service: Urology;  Laterality: N/A;    HOLEP-LASER ENUCLEATION OF THE PROSTATE WITH MORCELLATION N/A 11/26/2022   Procedure: HOLEP-LASER ENUCLEATION OF THE PROSTATE WITH MORCELLATION;  Surgeon: Sondra Come, MD;  Location: [facility] ORS;  Service: Urology;  Laterality: N/A;   oral surgery     TONSILLECTOMY      FAMILY HISTORY: family history includes Asthma in his mother; Emphysema in his father; Heart attack in his father; Tuberculosis in his mother.  SOCIAL HISTORY:  reports that he quit smoking about 5 years ago. His smoking use included cigarettes. He has a 30.00 pack-year smoking history. He has quit using smokeless tobacco. He reports that he does not currently use drugs after having used the following drugs: Marijuana. He reports that he does not drink alcohol.  ALLERGIES: Fluoxetine and Venlafaxine  MEDICATIONS:  Current Outpatient Medications  Medication Sig Dispense Refill   Ascorbic Acid (VITAMIN C) 1000 MG tablet Take 1,000 mg by mouth daily.     atorvastatin (LIPITOR) 20 MG tablet TAKE 1 TABLET BY MOUTH  DAILY AT 6PM 90 tablet 1   b complex vitamins tablet Take 1 tablet by mouth daily.     baclofen (LIORESAL) 10 MG tablet Take 5 mg by mouth 2 (two) times daily.     Blood Pressure Monitoring (SM WRIST CUFF BP MONITOR) MISC 1 Units by Does not apply route 2 (two) times a week. 1 each 0   buPROPion (WELLBUTRIN XL) 150 MG 24 hr tablet Take 450 mg by mouth daily.     busPIRone (BUSPAR) 10 MG tablet Take 1 tablet (10 mg total) by mouth 2 (two) times daily. 180 tablet 1   cholecalciferol (VITAMIN D3) 25 MCG (1000 UT)  tablet Take 4,000 Units by mouth daily.     Cobalamin Combinations (NEURIVA PLUS PO) Take 1 capsule by mouth daily in the afternoon.     cyanocobalamin 1000 MCG tablet Take 1,000 mcg by mouth daily.     GARLIC PO Take 2 tablets by mouth daily.     Iron, Ferrous Sulfate, 325 (65 Fe) MG TABS Take 1 tablet by mouth daily.     lactulose, encephalopathy, (CHRONULAC) 10 GM/15ML SOLN SMARTSIG:Milliliter(s) By Mouth      levothyroxine (SYNTHROID) 75 MCG tablet TAKE 1 TABLET BY MOUTH DAILY  BEFORE BREAKFAST 90 tablet 1   magnesium 30 MG tablet Take 1 tablet (30 mg total) by mouth daily. 90 tablet 3   mirtazapine (REMERON) 30 MG tablet Take 30 mg by mouth at bedtime.     polyethylene glycol (MIRALAX / GLYCOLAX) 17 g packet Take 17 g by mouth daily.  0   Saw Palmetto 450 MG CAPS Take 450 mg by mouth daily.     senna-docusate (SENOKOT-S) 8.6-50 MG tablet Take 1 tablet by mouth daily. 30 tablet 0   terazosin (HYTRIN) 5 MG capsule Take 5 mg by mouth at bedtime.     traZODone (DESYREL) 150 MG tablet Take 150 mg by mouth at bedtime.     No current facility-administered medications for this encounter.    ECOG PERFORMANCE STATUS:  0 - Asymptomatic  REVIEW OF SYSTEMS: Patient denies any weight loss, fatigue, weakness, fever, chills or night sweats. Patient denies any loss of vision, blurred vision. Patient denies any ringing  of the ears or hearing loss. No irregular heartbeat. Patient denies heart murmur or history of fainting. Patient denies any chest pain or pain radiating to her upper extremities. Patient denies any shortness of breath, difficulty breathing at night, cough or hemoptysis. Patient denies any swelling in the lower legs. Patient denies any nausea vomiting, vomiting of blood, or coffee ground material in the vomitus. Patient denies any stomach pain. Patient states has had normal bowel movements no significant constipation or diarrhea. Patient denies any dysuria, hematuria or significant nocturia. Patient denies any problems walking, swelling in the joints or loss of balance. Patient denies any skin changes, loss of hair or loss of weight. Patient denies any excessive worrying or anxiety or significant depression. Patient denies any problems with insomnia. Patient denies excessive thirst, polyuria, polydipsia. Patient denies any swollen glands, patient denies easy bruising or easy bleeding. Patient denies any  recent infections, allergies or URI. Patient "s visual fields have not changed significantly in recent time.   PHYSICAL EXAM: BP 98/66 (BP Location: Right Arm, Patient Position: Sitting, Cuff Size: Normal)   Pulse (!) 108   Temp 97.6 F (36.4 C)   Resp 16   Wt 165 lb (74.8 kg)   BMI 24.37 kg/m  Frail-appearing elderly male in NAD.  Well-developed well-nourished patient in NAD. HEENT reveals PERLA, EOMI, discs not visualized.  Oral cavity is clear. No oral mucosal lesions are identified. Neck is clear without evidence of cervical or supraclavicular adenopathy. Lungs are clear to A&P. Cardiac examination is essentially unremarkable with regular rate and rhythm without murmur rub or thrill. Abdomen is benign with no organomegaly or masses noted. Motor sensory and DTR levels are equal and symmetric in the upper and lower extremities. Cranial nerves II through XII are grossly intact. Proprioception is intact. No peripheral adenopathy or edema is identified. No motor or sensory levels are noted. Crude visual fields are within normal range.  LABORATORY DATA: Pathology  reports reviewed    RADIOLOGY RESULTS: CT scans reviewed compatible with above-stated findings   IMPRESSION: Stage IIIa urothelial carcinoma of the prostate in 82 year old male  PLAN: At this time elected ahead with concurrent chemoradiation.  I will plan on delivering 45 Gray using IMRT treatment planning and delivery to his pelvic nodes.  Would also boost his prostate another 20 Gray.  Would choose IMRT treatment planning and delivery to spare critical structures such as small bowel bladder and rectum.  Patient comprehends my treatment plan well.  Risks and benefits of treatment including increased lower urinary tract symptoms diarrhea fatigue alteration blood counts skin reaction all were reviewed in detail with the patient.  I have personally set up and ordered CT simulation for next week.  Will coordinate chemotherapy with medical  oncology.  There will be extra effort by both professional staff as well as technical staff to coordinate and manage concurrent chemoradiation and ensuing side effects during his treatments.   I would like to take this opportunity to thank you for allowing me to participate in the care of your patient.Carmina Miller, MD

## 2023-01-11 NOTE — Telephone Encounter (Signed)
Cheryl advised.  ? ?Thanks,  ? ?-Salam Micucci  ?

## 2023-01-11 NOTE — Telephone Encounter (Signed)
Copied from CRM [id number]. Topic: Quick Communication - Home Health Verbal Orders >> Jan 10, 2023  4:34 PM Everette C wrote: Caller/Agency: Verlon Au, RN / Melony Overly Number: [phone] Requesting OT/PT/Skilled Nursing/Social Work/Speech Therapy: skilled nursing  Frequency: 1w2 medication education

## 2023-01-12 DIAGNOSIS — F32A Depression, unspecified: Secondary | ICD-10-CM | POA: Diagnosis not present

## 2023-01-12 DIAGNOSIS — M48061 Spinal stenosis, lumbar region without neurogenic claudication: Secondary | ICD-10-CM | POA: Diagnosis not present

## 2023-01-12 DIAGNOSIS — G4730 Sleep apnea, unspecified: Secondary | ICD-10-CM | POA: Diagnosis not present

## 2023-01-12 DIAGNOSIS — K219 Gastro-esophageal reflux disease without esophagitis: Secondary | ICD-10-CM | POA: Diagnosis not present

## 2023-01-12 DIAGNOSIS — I10 Essential (primary) hypertension: Secondary | ICD-10-CM | POA: Diagnosis not present

## 2023-01-12 DIAGNOSIS — M5136 Other intervertebral disc degeneration, lumbar region: Secondary | ICD-10-CM | POA: Diagnosis not present

## 2023-01-12 DIAGNOSIS — E039 Hypothyroidism, unspecified: Secondary | ICD-10-CM | POA: Diagnosis not present

## 2023-01-12 DIAGNOSIS — Z9181 History of falling: Secondary | ICD-10-CM | POA: Diagnosis not present

## 2023-01-12 DIAGNOSIS — Z87891 Personal history of nicotine dependence: Secondary | ICD-10-CM | POA: Diagnosis not present

## 2023-01-12 DIAGNOSIS — D508 Other iron deficiency anemias: Secondary | ICD-10-CM | POA: Diagnosis not present

## 2023-01-12 DIAGNOSIS — K5900 Constipation, unspecified: Secondary | ICD-10-CM | POA: Diagnosis not present

## 2023-01-12 DIAGNOSIS — M1909 Primary osteoarthritis, other specified site: Secondary | ICD-10-CM | POA: Diagnosis not present

## 2023-01-12 DIAGNOSIS — E782 Mixed hyperlipidemia: Secondary | ICD-10-CM | POA: Diagnosis not present

## 2023-01-12 DIAGNOSIS — M4800 Spinal stenosis, site unspecified: Secondary | ICD-10-CM | POA: Diagnosis not present

## 2023-01-12 DIAGNOSIS — G43909 Migraine, unspecified, not intractable, without status migrainosus: Secondary | ICD-10-CM | POA: Diagnosis not present

## 2023-01-12 DIAGNOSIS — N451 Epididymitis: Secondary | ICD-10-CM | POA: Diagnosis not present

## 2023-01-13 ENCOUNTER — Telehealth: Payer: Self-pay | Admitting: Internal Medicine

## 2023-01-13 ENCOUNTER — Inpatient Hospital Stay: Payer: 59 | Admitting: Family Medicine

## 2023-01-13 ENCOUNTER — Ambulatory Visit: Payer: 59

## 2023-01-13 NOTE — Telephone Encounter (Signed)
Joe Hardy (OT) with [facility] states that she did the evaluation for the pt and pt is not in need of OT services.

## 2023-01-14 ENCOUNTER — Telehealth: Payer: Self-pay | Admitting: Internal Medicine

## 2023-01-14 ENCOUNTER — Inpatient Hospital Stay: Payer: 59 | Attending: Internal Medicine | Admitting: *Deleted

## 2023-01-14 ENCOUNTER — Other Ambulatory Visit: Payer: Self-pay | Admitting: Internal Medicine

## 2023-01-14 ENCOUNTER — Encounter: Payer: Self-pay | Admitting: Internal Medicine

## 2023-01-14 DIAGNOSIS — C689 Malignant neoplasm of urinary organ, unspecified: Secondary | ICD-10-CM

## 2023-01-14 NOTE — Progress Notes (Deleted)
ALERT: Recent [facility] Treatment decision is outdated. Please await next [facility] decision 

## 2023-01-14 NOTE — Progress Notes (Signed)
[facility] Clinical Social Work  Clinical Social Work was referred by nurse for SI/self-harm risk assessment. Cancer Center staff was communicating with staff via phone and communicated SI language (refer to B.Cheek note today).  CSW attempted to contact patient multiple times via phone to complete assessment and discuss supportive care resources. CSW unable to reach patient.   CSW discussed with medical oncology RN- due to concern for patient safety, well-being check by [facility] PD was requested.  CSW received notification they were at patient's home. CSW requested follow up call with summary of encounter.  Will update healthcare team once update is received.   Polo Riley, LCSW  Clinical Social Worker [facility]

## 2023-01-14 NOTE — Progress Notes (Signed)
Patient was evaluated by Rad Onc. Scheduled to start RT on May 14th.   Plan to add low dose of gemcitabine biweekly. Schedule for chemo class.  Start with peripheral IV. If there is difficulty, will consider port placement.

## 2023-01-14 NOTE — Telephone Encounter (Signed)
Called patient to inform him of requested appointments to be scheduled.   Joe Hardy was confused about his appointments. As I was going over them with him, he told me that he didn't think he was sick enough to need radiation and chemo. I told him I could send a message and get Dr. Alena Bills to call him. He told me "I am going to kill myself no one told me I was this sick."  I did say to him  he couldn't say things like that. I asked him to please hold on while I get the nurse on the phone.   I spoke to Ward and we went to Dr. Alan Ripper office and asked her if she could speak to him. She agreed. I came to transfer the call to him and when I picked up the phone and was trying to transfer the call he again stated" I am just gonna go out in traffic and kill myself." I said please don't do that and I am sending you right to Dr. Mervyn Skeeters now. He said "hurry up-I'm gonna f ing kill myself.   Call transferred to Dr. Alena Bills and I made sure she picked up before hanging up.

## 2023-01-15 ENCOUNTER — Other Ambulatory Visit: Payer: Self-pay

## 2023-01-17 ENCOUNTER — Telehealth: Payer: Self-pay | Admitting: *Deleted

## 2023-01-17 ENCOUNTER — Emergency Department
Admission: EM | Admit: 2023-01-17 | Discharge: 2023-01-17 | Disposition: A | Discharge status: Home / Self Care | Payer: 59 | Attending: Emergency Medicine | Admitting: Emergency Medicine

## 2023-01-17 ENCOUNTER — Other Ambulatory Visit: Payer: Self-pay

## 2023-01-17 ENCOUNTER — Ambulatory Visit: Admission: RE | Admit: 2023-01-17 | Payer: 59 | Source: Ambulatory Visit

## 2023-01-17 ENCOUNTER — Inpatient Hospital Stay: Payer: 59 | Admitting: Internal Medicine

## 2023-01-17 ENCOUNTER — Encounter: Payer: Self-pay | Admitting: *Deleted

## 2023-01-17 DIAGNOSIS — F3341 Major depressive disorder, recurrent, in partial remission: Secondary | ICD-10-CM | POA: Diagnosis not present

## 2023-01-17 DIAGNOSIS — F430 Acute stress reaction: Secondary | ICD-10-CM | POA: Diagnosis not present

## 2023-01-17 DIAGNOSIS — F4325 Adjustment disorder with mixed disturbance of emotions and conduct: Secondary | ICD-10-CM | POA: Diagnosis present

## 2023-01-17 DIAGNOSIS — I10 Essential (primary) hypertension: Secondary | ICD-10-CM | POA: Diagnosis not present

## 2023-01-17 DIAGNOSIS — R45851 Suicidal ideations: Secondary | ICD-10-CM

## 2023-01-17 DIAGNOSIS — Z87891 Personal history of nicotine dependence: Secondary | ICD-10-CM | POA: Insufficient documentation

## 2023-01-17 DIAGNOSIS — Z8551 Personal history of malignant neoplasm of bladder: Secondary | ICD-10-CM | POA: Insufficient documentation

## 2023-01-17 DIAGNOSIS — F4329 Adjustment disorder with other symptoms: Secondary | ICD-10-CM

## 2023-01-17 DIAGNOSIS — R45850 Homicidal ideations: Secondary | ICD-10-CM | POA: Insufficient documentation

## 2023-01-17 LAB — COMPREHENSIVE METABOLIC PANEL
ALT: 13 U/L (ref 0–44)
AST: 22 U/L (ref 15–41)
Albumin: 3.8 g/dL (ref 3.5–5.0)
Alkaline Phosphatase: 50 U/L (ref 38–126)
Anion gap: 9 (ref 5–15)
BUN: 15 mg/dL (ref 8–23)
CO2: 23 mmol/L (ref 22–32)
Calcium: 9.1 mg/dL (ref 8.9–10.3)
Chloride: 107 mmol/L (ref 98–111)
Creatinine, Ser: 0.67 mg/dL (ref 0.61–1.24)
GFR, Estimated: >60 mL/min (ref >60)
Glucose, Bld: 134 mg/dL — ABNORMAL HIGH (ref 70–99)
Potassium: 3.4 mmol/L — ABNORMAL LOW (ref 3.5–5.1)
Sodium: 139 mmol/L (ref 135–145)
Total Bilirubin: 0.6 mg/dL (ref 0.3–1.2)
Total Protein: 7.1 g/dL (ref 6.5–8.1)

## 2023-01-17 LAB — CBC
HCT: 40 % (ref 39.0–52.0)
Hemoglobin: 12.7 g/dL — ABNORMAL LOW (ref 13.0–17.0)
MCH: 22.1 pg — ABNORMAL LOW (ref 26.0–34.0)
MCHC: 31.8 g/dL (ref 30.0–36.0)
MCV: 69.7 fL — ABNORMAL LOW (ref 80.0–100.0)
Platelets: 230 10*3/uL (ref 150–400)
RBC: 5.74 MIL/uL (ref 4.22–5.81)
RDW: 20.2 % — ABNORMAL HIGH (ref 11.5–15.5)
WBC: 7.5 10*3/uL (ref 4.0–10.5)
nRBC: 0 % (ref 0.0–0.2)

## 2023-01-17 LAB — ETHANOL: Alcohol, Ethyl (B): <10 mg/dL (ref <10)

## 2023-01-17 NOTE — ED Notes (Signed)
Patient is tearful in triage.

## 2023-01-17 NOTE — BH Assessment (Signed)
Comprehensive Clinical Assessment (CCA) Screening, Triage and Referral Note  01/17/2023 Joe Hardy [id number]  Joe Hardy, 82 year old male who presents to [facility] ED involuntarily for treatment. Per triage note, states he said 'something over at the doctors office (cancer center) and said that I might do something to hurt myself".  Patient states Cancer center has given him two different stories about his cancer over the past week.  Person with patient states patient states "I am going to kill myself".   States on Friday the doctor told patient he had a different cancer than what the doctor had told him on Tuesday.  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  Patient states he said "well I might as well kill myself".  Seen by crisis on Friday and patient states they calmed him down.  Patient states he lives alone and does get lonely. Seen at [facility] today and patient is upset.  Believes that the doctor he spoke with on Friday and the doctor he spoke with on Tuesday, patient states "he lied to me".   During TTS assessment pt presents alert and oriented x 4, restless but cooperative, and mood-congruent with affect. The pt does not appear to be responding to internal or external stimuli. Neither is the pt presenting with any delusional thinking. Pt verified the information provided to triage RN.   Pt identifies his main complaint to be that he does not belong in the hospital and has no intentions of ever wanting to hurt himself. Patient states he was given 2 different cancer diagnosis by 2 different doctors within the same office, and he was very upset. Patient says he made a comment about wanting to kill himself but not in the context that he was going to actually do it or felt that way. "I was so upset because 1 doctor said one thing and then another doctor said something else. I did not mean it that  way." Patient reports he lives alone, has no children or living relatives and feels as though he is in the best of health. 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 Patient recalls previous suicide attempt 22 years ago due to depression and he was stopped by a stranger. Patient says at that time, he went to inpatient treatment at [facility] where he stayed about 2 weeks, and he has not looked back since. Pt denies current SI/HI/AH/VH. Pt contracts for safety and does not have access to guns. "I hate them." Patient is cooperative, coherent, and speaking in linear sentences.    Per Christal, NP, pt does not meet criteria for inpatient psychiatric admission.    Chief Complaint:  Chief Complaint  Patient presents with   Mental Health Problem   Visit Diagnosis:   Patient Reported Information How did you hear about [place]? -- Mudlogger)  What Is the Reason for Your Visit/Call Today? Patient says he was upset and made a statement that he should not have made.  How Long Has This Been Causing You Problems? <Week  What Do You Feel Would Help You the Most Today? -- (Assessment only)   Have You Recently Had Any Thoughts About Hurting Yourself? No  Are You Planning to Commit Suicide/Harm Yourself At This time? No   Have you Recently Had Thoughts About Hurting  Someone Joe Hardy? No  Are You Planning to Harm Someone at This Time? No  Explanation: No data recorded  Have You Used Any Alcohol or Drugs in the Past 24 Hours? No  How Long Ago Did You Use Drugs or Alcohol? No data recorded What Did You Use and How Much? No data recorded  Do You Currently Have a Therapist/Psychiatrist? No  Name of Therapist/Psychiatrist: No data recorded  Have You Been Recently Discharged From Any Office Practice or Programs? No  Explanation of Discharge From Practice/Program: No data recorded   CCA Screening Triage Referral  Assessment Type of Contact: Face-to-Face  Telemedicine Service Delivery:   Is this Initial or Reassessment?   Date Telepsych consult ordered in [facility]:    Time Telepsych consult ordered in [facility]:    Location of Assessment: [facility] ED  Provider Location: [facility] ED    Collateral Involvement: None provided   Does Patient Have a Court Appointed Legal Guardian? No data recorded Name and Contact of Legal Guardian: No data recorded If Minor and Not Living with Parent(s), Who has Custody? No data recorded Is CPS involved or ever been involved? Never  Is APS involved or ever been involved? Never   Patient Determined To Be At Risk for Harm To Self or Others Based on Review of Patient Reported Information or Presenting Complaint? No  Method: No Plan  Availability of Means: No access or NA  Intent: Vague intent or NA  Notification Required: No data recorded Additional Information for Danger to Others Potential: No data recorded Additional Comments for Danger to Others Potential: No data recorded Are There Guns or Other Weapons in Your Home? No  Types of Guns/Weapons: No data recorded Are These Weapons Safely Secured?                            No data recorded Who Could Verify You Are Able To Have These Secured: No data recorded Do You Have any Outstanding Charges, Pending Court Dates, Parole/Probation? No data recorded Contacted To Inform of Risk of Harm To Self or Others: No data recorded  Does Patient Present under Involuntary Commitment? No    [place] of Residence: [place]   Patient Currently Receiving the Following Services: Medication Management   Determination of Need: Emergent (2 hours)   Options For Referral: ED Visit; Medication Management; Therapeutic Triage Services   Discharge Disposition:     Clerance Lav, Counselor, LCAS-A

## 2023-01-17 NOTE — ED Provider Notes (Signed)
[facility] Provider Note    Event Date/Time   First MD Initiated Contact with Patient 01/17/23 1222     (approximate)   History   Chief Complaint Mental Health Problem   HPI  Joe Hardy is a 82 y.o. male with past medical history of hypertension, hyperlipidemia, urothelial carcinoma, migraines, and iron deficiency anemia who presents to the ED for psychiatric evaluation.  Patient reports that he was recently diagnosed with cancer, felt like he got 2 different answers as to how treatable the cancer was.  He states that this upset him and 3 decades ago he "lost it."  He reports stating that "I might as well kill myself."  Police showed up to his house at that time, but he then stated he was no longer feeling this way and was not evaluated.  He made similar threats again today while speaking with his cancer doctor, was subsequently referred to the ED for further evaluation.  He again states that he is no longer having thoughts of suicide, states that he would like to go home and change the battery in his car.  He denies any medical complaints at this time.     Physical Exam   Triage Vital Signs: ED Triage Vitals  Enc Vitals Group     BP 01/17/23 1108 (!) 169/98     Pulse Rate 01/17/23 1108 (!) 113     Resp 01/17/23 1108 16     Temp 01/17/23 1108 97.8 F (36.6 C)     Temp Source 01/17/23 1108 Oral     SpO2 01/17/23 1108 94 %     Weight 01/17/23 1107 164 lb 14.5 oz (74.8 kg)     Height 01/17/23 1107 5\' 9"  (1.753 m)     Head Circumference --      Peak Flow --      Pain Score 01/17/23 1107 0     Pain Loc --      Pain Edu? --      Excl. in GC? --     Most recent vital signs: Vitals:   01/17/23 1108  BP: (!) 169/98  Pulse: (!) 113  Resp: 16  Temp: 97.8 F (36.6 C)  SpO2: 94%    Constitutional: Alert and oriented. Eyes: Conjunctivae are normal. Head: Atraumatic. Nose: No congestion/rhinnorhea. Mouth/Throat: Mucous membranes are moist.   Cardiovascular: Normal rate, regular rhythm. Grossly normal heart sounds.  2+ radial pulses bilaterally. Respiratory: Normal respiratory effort.  No retractions. Lungs CTAB. Gastrointestinal: Soft and nontender. No distention. Musculoskeletal: No lower extremity tenderness nor edema.  Neurologic:  Normal speech and language. No gross focal neurologic deficits are appreciated.    ED Results / Procedures / Treatments   Labs (all labs ordered are listed, but only abnormal results are displayed) Labs Reviewed  COMPREHENSIVE METABOLIC PANEL - Abnormal; Notable for the following components:      Result Value   Potassium 3.4 (*)    Glucose, Bld 134 (*)    All other components within normal limits  CBC - Abnormal; Notable for the following components:   Hemoglobin 12.7 (*)    MCV 69.7 (*)    MCH 22.1 (*)    RDW 20.2 (*)    All other components within normal limits  ETHANOL  URINE DRUG SCREEN, QUALITATIVE ([facility] ONLY)    PROCEDURES:  Critical Care performed: No  Procedures   MEDICATIONS ORDERED IN ED: Medications - No data to display   IMPRESSION / MDM / ASSESSMENT  AND PLAN / ED COURSE  I reviewed the triage vital signs and the nursing notes.                              82 y.o. male with past medical history of hypertension, hyperlipidemia, migraines, urothelial carcinoma, and IDA who presents to the ED complaining of intermittent suicidal ideation over the past couple of days, now denies any active SI.  Patient's presentation is most consistent with acute presentation with potential threat to life or bodily function.  Differential diagnosis includes, but is not limited to, depression, anxiety, psychosis, anemia, electrolyte abnormality.  Patient nontoxic-appearing and in no acute distress, denies any medical complaints.  Screening labs are unremarkable with no significant anemia, leukocytosis, tract abnormality, or AKI.  LFTs are reassuring and ethanol level undetectable.   Patient may be medically cleared for psychiatric disposition.  He is calm and cooperative, denies any active SI and is willing to speak with psychiatry.  We will maintain voluntary status for now, psychiatric evaluation is pending.  The patient has been placed in psychiatric observation due to the need to provide a safe environment for the patient while obtaining psychiatric consultation and evaluation, as well as ongoing medical and medication management to treat the patient's condition.  The patient has not been placed under full IVC at this time.      FINAL CLINICAL IMPRESSION(S) / ED DIAGNOSES   Final diagnoses:  Suicidal ideation     Rx / DC Orders   ED Discharge Orders     None        Note:  This document was prepared using Dragon voice recognition software and may include unintentional dictation errors.   Chesley Noon, MD 01/17/23 [phone]

## 2023-01-17 NOTE — ED Notes (Signed)
Dr. Jessup at the bedside for pt evaluation  

## 2023-01-17 NOTE — ED Notes (Signed)
Called for pt ride home. Pt pending discharge.

## 2023-01-17 NOTE — ED Triage Notes (Signed)
States states he said 'something over at the doctors office (cancer center) and said that I might do something to hurt myself".  Patient states Cancer center has given him two different stories about his cancer over the past week.  Person with patient states patient states "I am going to kill myself".   States on Friday the doctor told patient he had a different cancer than what the doctor had told him on Tuesday.  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  Patient states he said "well I might as well kill myself".  Seen by crisis on Friday and patient states they calmed him down.  Patient states he lives alone and does get lonely.  Seen at [facility] today and patient is upset.  Believes that the doctor he spoke with the doctor he spoke with on Tuesday and patient states "he lied to me".  AAOx3.  Skin warm and dry. NAD. Denies current SI/HI

## 2023-01-17 NOTE — ED Provider Notes (Signed)
Patient cleared by psychiatry. He has contracted for safety. Medically stable for psych dispo.   Shaune Pollack, MD 01/17/23 [phone]

## 2023-01-17 NOTE — ED Notes (Addendum)
Pt belongings:  [employer] Char[employer] fundraiser Beige Socks  Jeans Blue/Black jacket Blue [employer] t-shirt [employer] belt [employer]  *Derrick B. (Friend) is taking pts wallet, cell phone and 2 sets of keys.

## 2023-01-17 NOTE — Telephone Encounter (Signed)
After discussion with Dr. Alena Bills, there is concern for patient safety. RN initiated call to Polo Riley, LCSW regarding urgent patient need. Lauren will reach out to patient and evaluate suicide risk assessment and needs and proceed based on outcome of conversation. RN passed along all direct quotes as documented by patient regarding self harm.    Voicemails x 2 received from scheduling, patient left messages upset with communication about treatment plan. Dr. Alena Bills reached out to discuss treatment plan in detail this afternoon and scheduled follow up MD visit on Monday 5/6 that patient confirmed while on the phone with MD to discuss any further questions. RN discussed with RN Nurse Manager Gladys Damme regarding how to proceed with follow up calls due to aggressive nature and tone of calls as well as fact that MD had reached out to answer questions earlier in the day. It was advised to not return call to patient at this time as Polo Riley, LCSW  is also involved in risk assessment and wellness check by PD due to threats of self harm earlier in the day. Patient is scheduled for MD follow up visit on 5/6 at this time.

## 2023-01-17 NOTE — Consult Note (Addendum)
[facility] Face-to-Face Psychiatry Consult   Reason for Consult:  Psychiatric Evaluation  Referring Physician:  Chesley Noon, MD Patient Identification: Joe Hardy MRN:  [id number] Principal Diagnosis: Acute stress reaction causing mixed disturbance of emotion and conduct Diagnosis:  Principal Problem:   Acute stress reaction causing mixed disturbance of emotion and conduct Active Problems:   Depression, major, recurrent, in partial remission (HCC)   Suicidal ideation   Total Time spent with patient: 45 minutes  Subjective:  "I have no desire to hurt myself." Joe Hardy is a 82 y.o. male patient admitted with SI.  HPI:  Joe Hardy is a 82 y.o. male with past psychiatric history of depression was [facility] to suicidal ideation.   Patient seen and chart reviewed. Case discussed with Dr. Penne Lash, EDP. The patient clarifies his statements were made out of frustration during a recent medical appointment. He reports recently being hospitalized in April for four days and subsequently spent 23 days in a nursing facility for rehabilitation. He reports being diagnosed with prostate cancer last Tuesday, with recommendations for radiation, and bladder cancer the following Friday, with recommendations for chemotherapy starting this week through July 1. The patient expresses confusion and frustration over receiving different diagnoses and recommendations from different doctors, which led to his outburst. Despite the recent diagnoses and emotional turmoil, he remains optimistic and is considering seeking a second opinion before starting treatment. His sleep and appetite are reported as satisfactory. He lives alone and expresses a desire to change his car battery and go on a vacation for a change of scenery, which he believes would be beneficial.  He reports he is currently on trazodone, mirtazapine, and Bupropion for depression and  insomnia. He reports he has been married three times and has no children. He reflects on his life, stating that if he were to die from cancer, he believes he has lived a good life. He denies current access to firearms. The patient contracts for safety and expresses a desire to return home.   During evaluation, he is seated on side of the bed. He is casually dressed and well-groomed in appearance. He is alert and oriented x 4. He does not perceive auditory or visual hallucinations. Mood normal; He is tearful and remorseful. Speech is clear and coherent at normal rate and volume; answers questions appropriately. Thought content is not paranoid or delusional; does not include suicidal or homicidal ideation. Ethyl alcohol unremarkable.   Past Psychiatric History: He reports a history of depression and insomnia. He reports a past suicide attempt 22 years ago due to depression after losing his last living relative by carbon monoxide poisoning in a motor vehicle. 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   Risk to Self:  No Risk to Others:  No  Prior Inpatient Therapy:   Prior Outpatient Therapy:    Past Medical History:  Past Medical History:  Diagnosis Date   Bladder stones    Depression    GERD (gastroesophageal reflux disease)    Hyperlipidemia    Hypertension    Iron deficiency anemia    Migraine    Sleep apnea     Past Surgical History:  Procedure Laterality Date   COLONOSCOPY     CYSTOSCOPY WITH LITHOLAPAXY N/A 11/26/2022   Procedure: CYSTOSCOPY WITH LITHOLAPAXY;  Surgeon: Sondra Come, MD;  Location: [facility] ORS;  Service: Urology;  Laterality: N/A;   HOLEP-LASER ENUCLEATION OF THE  PROSTATE WITH MORCELLATION N/A 11/26/2022   Procedure: HOLEP-LASER ENUCLEATION OF THE PROSTATE WITH MORCELLATION;  Surgeon: Sondra Come, MD;  Location: [facility] ORS;  Service: Urology;  Laterality: N/A;   oral surgery     TONSILLECTOMY     Family History:  Family  History  Problem Relation Age of Onset   Tuberculosis Mother    Asthma Mother    Emphysema Father    Heart attack Father    Family Psychiatric  History: Chart reviewed. No family psychiatric history on file. 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)     Social History   Substance and Sexual Activity  Drug Use Not Currently   Types: Marijuana    Social History   Socioeconomic History   Marital status: Divorced    Spouse name: Not on file   Number of children: Not on file   Years of education: Not on file   Highest education level: Bachelor's degree (e.g., BA, AB, BS)  Occupational History   Occupation: retired  Tobacco Use   Smoking status: Former    Packs/day: 1.50    Years: 20.00    Additional pack years: 0.00    Total pack years: 30.00    Types: Cigarettes    Quit date: 05/02/2017    Years since quitting: 5.7   Smokeless tobacco: Former   Tobacco comments:    Starts and stops related to anxiety rather than drinking alcohol  Vaping Use   Vaping Use: Never used  Substance and Sexual Activity   Alcohol use: No    Alcohol/week: 0.0 standard drinks of alcohol    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)   Drug use: Not Currently    Types: Marijuana   Sexual activity: Not Currently  Other Topics Concern   Not on file  Social History Narrative   Not on file   Social Determinants of Health   Financial Resource Strain: Low Risk  (10/09/2021)   Overall Financial Resource Strain (CARDIA)    Difficulty of Paying Living Expenses: Not hard at all  Food Insecurity: No Food Insecurity (12/21/2022)   Hunger Vital Sign    Worried About Running Out of Food in the Last Year: Never true    Ran Out of Food in the Last Year: Never true  Transportation Needs: Unmet Transportation Needs (12/10/2022)   PRAPARE - Scientist, research (physical sciences) (Medical): Yes    Lack of Transportation (Non-Medical): No  Physical Activity: Inactive (10/09/2021)   Exercise Vital Sign    Days of Exercise per Week: 0 days    Minutes of Exercise per Session: 0 min  Stress: No Stress Concern Present (10/09/2021)   [employer] of Occupational Health - Occupational Stress Questionnaire    Feeling of Stress : Not at all  Social Connections: Moderately Isolated (10/09/2021)   Social Connection and Isolation Panel [NHANES]    Frequency of Communication with Friends and Family: More than three times a week    Frequency of Social Gatherings with Friends and Family: More than three times a week    Attends Religious Services: Never    Database administrator or Organizations: Yes    Attends Banker Meetings: Never    Marital Status: Divorced   Additional Social History:    Allergies:   Allergies  Allergen Reactions  Fluoxetine     insomnia   Venlafaxine     difficulty sleeping    Labs:  Results for orders placed or performed during the hospital encounter of 01/17/23 (from the past 48 hour(s))  Comprehensive metabolic panel     Status: Abnormal   Collection Time: 01/17/23 11:15 AM  Result Value Ref Range   Sodium 139 135 - 145 mmol/L   Potassium 3.4 (L) 3.5 - 5.1 mmol/L   Chloride 107 98 - 111 mmol/L   CO2 23 22 - 32 mmol/L   Glucose, Bld 134 (H) 70 - 99 mg/dL    Comment: Glucose reference range applies only to samples taken after fasting for at least 8 hours.   BUN 15 8 - 23 mg/dL   Creatinine, Ser 5.62 0.61 - 1.24 mg/dL   Calcium 9.1 8.9 - 13.0 mg/dL   Total Protein 7.1 6.5 - 8.1 g/dL   Albumin 3.8 3.5 - 5.0 g/dL   AST 22 15 - 41 U/L   ALT 13 0 - 44 U/L   Alkaline Phosphatase 50 38 - 126 U/L   Total Bilirubin 0.6 0.3 - 1.2 mg/dL   GFR, Estimated >86 >57 mL/min    Comment: (NOTE) Calculated using the CKD-EPI Creatinine Equation (2021)    Anion gap 9 5 - 15    Comment: Performed at [facility],  [address] [place], [place] [address]  Ethanol     Status: None   Collection Time: 01/17/23 11:15 AM  Result Value Ref Range   Alcohol, Ethyl (B) <10 <10 mg/dL    Comment: (NOTE) Lowest detectable limit for serum alcohol is 10 mg/dL.  For medical purposes only. Performed at [facility], [address] [place], [place] [address]   cbc     Status: Abnormal   Collection Time: 01/17/23 11:15 AM  Result Value Ref Range   WBC 7.5 4.0 - 10.5 K/uL   RBC 5.74 4.22 - 5.81 MIL/uL   Hemoglobin 12.7 (L) 13.0 - 17.0 g/dL   HCT 41.3 24.4 - 01.0 %   MCV 69.7 (L) 80.0 - 100.0 fL   MCH 22.1 (L) 26.0 - 34.0 pg   MCHC 31.8 30.0 - 36.0 g/dL   RDW 27.2 (H) 53.6 - 64.4 %   Platelets 230 150 - 400 K/uL   nRBC 0.0 0.0 - 0.2 %    Comment: Performed at [facility], [address] [place], [place] [address]    No current facility-administered medications for this encounter.   Current Outpatient Medications  Medication Sig Dispense Refill   Ascorbic Acid (VITAMIN C) 1000 MG tablet Take 1,000 mg by mouth daily.     atorvastatin (LIPITOR) 20 MG tablet TAKE 1 TABLET BY MOUTH  DAILY AT 6PM 90 tablet 1   b complex vitamins tablet Take 1 tablet by mouth daily.     Baclofen 5 MG TABS Take 1 tablet by mouth 2 (two) times daily as needed.     buPROPion (WELLBUTRIN XL) 150 MG 24 hr tablet Take 450 mg by mouth daily.     busPIRone (BUSPAR) 10 MG tablet Take 1 tablet (10 mg total) by mouth 2 (two) times daily. 180 tablet 1   Cholecalciferol (VITAMIN D) 50 MCG (2000 UT) tablet Take 4,000 Units by mouth daily.     Cobalamin Combinations (NEURIVA PLUS PO) Take 1 capsule by mouth daily in the afternoon.     cyanocobalamin 1000 MCG tablet Take 1,000 mcg by mouth daily.  GARLIC PO Take 2 tablets by mouth daily.     Iron, Ferrous Sulfate, 325 (65 Fe) MG TABS Take 1 tablet by mouth daily.     lactulose, encephalopathy, (CHRONULAC) 10 GM/15ML SOLN SMARTSIG:Milliliter(s) By Mouth      levothyroxine (SYNTHROID) 75 MCG tablet TAKE 1 TABLET BY MOUTH DAILY  BEFORE BREAKFAST 90 tablet 1   magnesium 30 MG tablet Take 1 tablet (30 mg total) by mouth daily. 90 tablet 3   MIRALAX 17 GM/SCOOP powder Take 17 g by mouth daily.     mirtazapine (REMERON) 30 MG tablet Take 30 mg by mouth at bedtime.     polyethylene glycol (MIRALAX / GLYCOLAX) 17 g packet Take 17 g by mouth daily.  0   Saw Palmetto 450 MG CAPS Take 450 mg by mouth daily.     senna-docusate (SENOKOT-S) 8.6-50 MG tablet Take 1 tablet by mouth daily. 30 tablet 0   terazosin (HYTRIN) 5 MG capsule Take 5 mg by mouth at bedtime.     traZODone (DESYREL) 150 MG tablet Take 150 mg by mouth at bedtime.     baclofen (LIORESAL) 10 MG tablet Take 5 mg by mouth 2 (two) times daily. (Patient not taking: Reported on 01/17/2023)     Blood Pressure Monitoring (SM WRIST CUFF BP MONITOR) MISC 1 Units by Does not apply route 2 (two) times a week. 1 each 0   cholecalciferol (VITAMIN D3) 25 MCG (1000 UT) tablet Take 4,000 Units by mouth daily. (Patient not taking: Reported on 01/17/2023)      Musculoskeletal: Strength & Muscle Tone: within normal limits Gait & Station:  Did not assess  Patient leans: N/A            Psychiatric Specialty Exam:  Presentation  General Appearance: Casual  Eye Contact:Good  Speech:Clear and Coherent  Speech Volume:Normal  Handedness:Right   Mood and Affect  Mood:No data recorded Affect:Congruent   Thought Process  Thought Processes:Coherent  Descriptions of Associations:Intact  Orientation:Full (Time, Place and Person)  Thought Content:Logical; WDL  History of Schizophrenia/Schizoaffective disorder:No data recorded Duration of Psychotic Symptoms:No data recorded Hallucinations:Hallucinations: None  Ideas of Reference:None  Suicidal Thoughts:Suicidal Thoughts: No  Homicidal Thoughts:Homicidal Thoughts: No   Sensorium  Memory:Immediate Good; Recent  Good  Judgment:Fair  Insight:Fair   Executive Functions  Concentration:Good  Attention Span:Good  Recall:Good  Fund of Knowledge:Good  Language:Good   Psychomotor Activity  Psychomotor Activity:Psychomotor Activity: Normal   Assets  Assets:Communication Skills; Desire for Improvement; Housing; Resilience   Sleep  Sleep:Sleep: Good   Physical Exam: Physical Exam Vitals and nursing note reviewed.  HENT:     Head: Normocephalic.     Nose: Nose normal.  Pulmonary:     Effort: Pulmonary effort is normal.  Musculoskeletal:        General: Normal range of motion.     Cervical back: Normal range of motion.  Neurological:     Mental Status: He is alert and oriented to person, place, and time.  Psychiatric:        Attention and Perception: Attention and perception normal. He does not perceive auditory or visual hallucinations.        Mood and Affect: Mood normal. Affect is tearful.        Speech: Speech normal.        Behavior: Behavior is cooperative.        Thought Content: Thought content normal. Thought content is not paranoid or delusional. Thought content does not include homicidal or suicidal  ideation.        Cognition and Memory: Cognition and memory normal.        Judgment: Judgment normal.    ROS Blood pressure (!) 169/98, pulse (!) 113, temperature 97.8 F (36.6 C), temperature source Oral, resp. rate 16, height 5\' 9"  (1.753 m), weight 74.8 kg, SpO2 94 %. Body mass index is 24.35 kg/m.  Treatment Plan Summary: The patient expresses suicidal thoughts during a recent doctor's visit but is currently tearful and remorseful, denying any active intent to harm himself. He expresses a desire to return home. Plan There are no acute psychiatric concerns noted, and he is not actively suicidal or homicidal. Continue taking current medications as prescribed. Patient is cleared psychiatrically.  Plan reviewed with Dr. Erma Heritage.    Disposition: No evidence of imminent  risk to self or others at present.   Patient does not meet criteria for psychiatric inpatient admission. Supportive therapy provided about ongoing stressors. Discussed crisis plan, support from social network, calling 911, coming to the Emergency Department, and calling Suicide Hotline.  Norma Fredrickson, NP 01/17/2023 8:02 PM

## 2023-01-18 ENCOUNTER — Inpatient Hospital Stay: Payer: 59

## 2023-01-19 ENCOUNTER — Ambulatory Visit (INDEPENDENT_AMBULATORY_CARE_PROVIDER_SITE_OTHER): Payer: 59 | Admitting: Family Medicine

## 2023-01-19 ENCOUNTER — Encounter: Payer: Self-pay | Admitting: Family Medicine

## 2023-01-19 ENCOUNTER — Telehealth: Payer: Self-pay

## 2023-01-19 VITALS — BP 144/80 | HR 95 | Ht 69.0 in | Wt 170.0 lb

## 2023-01-19 DIAGNOSIS — R350 Frequency of micturition: Secondary | ICD-10-CM

## 2023-01-19 DIAGNOSIS — C689 Malignant neoplasm of urinary organ, unspecified: Secondary | ICD-10-CM

## 2023-01-19 DIAGNOSIS — N401 Enlarged prostate with lower urinary tract symptoms: Secondary | ICD-10-CM

## 2023-01-19 NOTE — Patient Instructions (Addendum)
Thank you for coming to the office today.  Keep up with [facility] team as scheduled. 02/03/23  Visit Details  Encounter Start Encounter End  02/03/2023 10:20 AM 02/03/2023 11:00 AM    Larina Bras, MD Hematology and Oncology NPI: [id number] [address] [place] [address]   Phone: [phone]  Please schedule a Follow-up Appointment to: Return in about 3 weeks (around 02/09/2023) for 3 weeks follow-up Oncology updates (after [facility] visit).  If you have any other questions or concerns, please feel free to call the office or send a message through MyChart. You may also schedule an earlier appointment if necessary.  Additionally, you may be receiving a survey about your experience at our office within a few days to 1 week by e-mail or mail. We value your feedback.  [name], DO [facility], [place]

## 2023-01-19 NOTE — Telephone Encounter (Signed)
Copied from CRM [id number]. Topic: General - Inquiry >> Jan 18, 2023 12:49 PM Patsy Lager T wrote: Reason for CRM: patient said he was given 2 different opinions in 3 days by the physicians at [facility]. He stated he wanted Dr Kirtland Bouchard to know he will never go back to that place again. He has an appt with [facility].

## 2023-01-19 NOTE — Progress Notes (Signed)
Subjective:    Patient ID: Joe Hardy, male    DOB: 01-Jan-1941, 82 y.o.   MRN: [id number]  Joe Hardy is a 82 y.o. male presenting on 01/19/2023 for Medical Management of Chronic Issues (Has support person present today Ladene Artist)   HPI  Specialists  Urology - Dr Tomie China Oncology - Dr Alena Bills Med Onc [facility] Rad Onc - Dr Rushie Chestnut [facility]  Recent updates  Diagnosed with abnormal prostate and bladder on CT 11/09/22, pursued further work up and biopsy.  Followed by urology  S/p Holep-Laser Prostate surgery He had difficulty urinating and then proceeded to catheter Surgery March 2024  He had significant weakness and lost weight, difficulty with foley catheter issues at home. He had urinary obstruction.  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  He was frustrated with the way the cancer diagnosis was communicated to him. He was advised that his cancer was treatable and they were in process of planning radiation and chemotherapy.  He would like a 2nd opinion based on his preference of the diagnosis and treatment plan.  Hospitalization from March to April 2024. 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 He planned suicide but was unsuccessful. He was evaluated by behavioral health hospitalization. He used marijuana in past. 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 He wants to live more than ever right now He has no intent at this time for any suicidal or self harm ideation. He was evaluated at hospital ED for this as well and cleared      01/19/2023    9:45 AM 12/21/2022   11:41 AM 12/07/2022    1:56 PM  Depression  screen PHQ 2/9  Decreased Interest 2 3 3   Down, Depressed, Hopeless 3 3 3   PHQ - 2 Score 5 6 6   Altered sleeping 2 1 0  Tired, decreased energy 1 3 3   Change in appetite 1 2 2   Feeling bad or failure about yourself  2 3 3   Trouble concentrating 0 3 0  Moving slowly or fidgety/restless 0 3 3  Suicidal thoughts 1 3 2   PHQ-9 Score 12 24 19   Difficult doing work/chores   Extremely dIfficult      12/07/2022    1:57 PM 09/23/2022   10:11 AM 03/09/2021   11:00 AM 07/27/2019   11:00 AM  GAD 7 : Generalized Anxiety Score  Nervous, Anxious, on Edge 1 1 2 1   Control/stop worrying 3 0 2 1  Worry too much - different things 2 0 1 0  Trouble relaxing 1 0 0 0  Restless 0 0 0 0  Easily annoyed or irritable 1 0 0 0  Afraid - awful might happen 2 1 2  0  Total GAD 7 Score 10 2 7 2   Anxiety Difficulty Very difficult Not difficult at all  Not difficult at all     Social History   Tobacco Use   Smoking status: Former    Packs/day: 1.50    Years: 20.00    Additional pack years: 0.00    Total pack years: 30.00    Types: Cigarettes    Quit  date: 05/02/2017    Years since quitting: 5.7   Smokeless tobacco: Former   Tobacco comments:    Starts and stops related to anxiety rather than drinking alcohol  Vaping Use   Vaping Use: Never used  Substance Use Topics   Alcohol use: No    Alcohol/week: 0.0 standard drinks of alcohol    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  Constitutional:      General: He is not in acute distress.     Appearance: Normal appearance. He is well-developed. He is not diaphoretic.     Comments: Well-appearing, comfortable, cooperative  HENT:     Head: Normocephalic and atraumatic.  Eyes:     General:        Right eye: No discharge.        Left eye: No discharge.     Conjunctiva/sclera: Conjunctivae normal.  Cardiovascular:     Rate and Rhythm: Normal rate.  Pulmonary:     Effort: Pulmonary effort is normal.  Skin:    General: Skin is warm and dry.     Findings: No erythema or rash.  Neurological:     Mental Status: He is alert and oriented to person, place, and time.  Psychiatric:        Mood and Affect: Mood normal.        Behavior: Behavior normal.        Thought Content: Thought content normal.     Comments: Well groomed, good eye contact, normal speech and thoughts     I have personally reviewed the radiology report from 11/09/22 on CT Hematuria .  CLINICAL DATA:  Gross hematuria   EXAM: CT ABDOMEN AND PELVIS WITHOUT AND WITH CONTRAST   TECHNIQUE: Multidetector CT imaging of the abdomen and pelvis was performed following the standard protocol before and following the bolus administration of intravenous contrast.   RADIATION DOSE REDUCTION: This exam was performed according to the departmental dose-optimization program which includes automated exposure control, adjustment of the mA and/or kV according to patient size and/or use of iterative reconstruction technique.   CONTRAST:  OMNIPAQUE IOHEXOL 300 MG/ML  SOLN   COMPARISON:  None Available.   FINDINGS: Lower chest: Lung bases are clear.  No pleural effusion.   Hepatobiliary: Several small hepatic cysts are identified. Largest is seen in the dome in segment 2 measuring 2.1 cm in diameter. Many of the others are quite small, under a cm and too small to characterize. Overall have benign features. No specific imaging follow-up. Patent portal vein gallbladder is nondilated   Pancreas: Unremarkable. No  pancreatic ductal dilatation or surrounding inflammatory changes.   Spleen: Normal in size without focal abnormality.   Adrenals/Urinary Tract: Adrenal glands are preserved.   No abnormal calcifications are seen within either kidney nor along the expected course of either ureter. Preserved renal parenchyma. No enhancing renal mass. There are  2 low-attenuation left-sided renal lesions. One parenchymal measuring 2.2 by 1.4 cm on series 9, image 23. Exophytic posterolateral focus in the left kidney measures 2.1 1.8 cm on image 26. On the precontrast dataset Hounsfield unit of 10 of the parenchymal lesion and 17 of the exophytic. Portal venous phase Hounsfield units of 22 and 19 and on delayed 18 and 28, respectively. No clear septa or nodular components. Bosniak 1 lesions. No specific imaging follow-up. There is a tiny, 4 mm focus in the upper pole of the right kidney best seen on series 18, image 32 also likely benign and no specific imaging follow-up.   The calices bilaterally are delicately cupped without dilatation, defect or urothelial thickening. The ureters have a normal course and caliber down to the bladder. The bladder is with diffuse wall thickening, trabeculation and diverticula formation. There is also elevation of the bladder from the pelvic floor with J hooking of the ureters by the enlarged prostate. The prostate measures 6.3 by 5.8 by 6.1 cm. Some calcifications are noted in the prostate as well. Mass effect along the base of the bladder. Indwelling Foley catheter. There are also several bladder stones identified. Largest measures 16 mm. Several smaller foci as well.   Stomach/Bowel: Diffuse colonic stool identified. Overall moderate stool burden. No large bowel dilatation or obstruction. Few diverticula. Normal appendix. Stomach is mildly distended with fluid. Small bowel is nondilated.   Vascular/Lymphatic: Scattered vascular calcifications. Normal caliber aorta  and IVC. No abnormal lymph node enlargement seen in the upper abdomen or retroperitoneum. There is 1 lymph nodes seen along the low anterior left hemipelvis on series 9, image 66 measuring 12 x 13 mm.   Reproductive: Please see above.   Other: Anasarca.   Musculoskeletal: Advanced multilevel degenerative changes along the spine with multilevel stenosis. Degenerative changes are also seen of the pelvis to a lesser extent.   IMPRESSION: Enlarged prostate with mass effect along the base of the bladder.   Foley catheter.   There is severe bladder wall thickening with trabeculation and diverticula formation. In addition there are several luminal bladder stones.   No proximal renal collecting system dilatation, filling defect or enhancing mass. No additional stones. Benign renal cysts.   Solitary pathologically enlarged node along the anterior left hemipelvis. Recommend correlation to any prior study to assess stability or further workup when appropriate.     Electronically Signed   By: Karen Kays M.D.   On: 11/10/2022 16:47  Results for orders placed or performed during the hospital encounter of 01/17/23  Comprehensive metabolic panel  Result Value Ref Range   Sodium 139 135 - 145 mmol/L   Potassium 3.4 (L) 3.5 - 5.1 mmol/L   Chloride 107 98 - 111 mmol/L   CO2 23 22 - 32 mmol/L   Glucose, Bld 134 (H) 70 - 99 mg/dL   BUN 15 8 - 23 mg/dL   Creatinine, Ser 4.54 0.61 - 1.24 mg/dL   Calcium 9.1 8.9 - 09.8 mg/dL   Total Protein 7.1 6.5 - 8.1 g/dL   Albumin 3.8 3.5 - 5.0 g/dL   AST 22 15 - 41 U/L   ALT 13 0 - 44 U/L   Alkaline Phosphatase 50 38 - 126 U/L   Total Bilirubin 0.6 0.3 - 1.2 mg/dL   GFR, Estimated >11 >91 mL/min   Anion gap 9 5 - 15  Ethanol  Result Value Ref Range   Alcohol, Ethyl (B) <10 <10 mg/dL  cbc  Result Value Ref  Range   WBC 7.5 4.0 - 10.5 K/uL   RBC 5.74 4.22 - 5.81 MIL/uL   Hemoglobin 12.7 (L) 13.0 - 17.0 g/dL   HCT 16.1 09.6 - 04.5 %   MCV  69.7 (L) 80.0 - 100.0 fL   MCH 22.1 (L) 26.0 - 34.0 pg   MCHC 31.8 30.0 - 36.0 g/dL   RDW 40.9 (H) 81.1 - 91.4 %   Platelets 230 150 - 400 K/uL   nRBC 0.0 0.0 - 0.2 %      Assessment & Plan:   Problem List Items Addressed This Visit     Urothelial carcinoma (HCC) - Primary   Other Visit Diagnoses     Benign prostatic hyperplasia with urinary frequency           Long discussion today on his recent history, visits with specialists Urology and Oncology, Rad Onc. Ultimately patient expressed his frustration with his care recently regarding communication on his diagnosis.  We have discussed his concerns with his mental health history of depression and suicidal ideation/attempt 20+ years ago.  He has had recent concern based on his emotional verbalization that he had suicidal intent, however he was evaluated in [facility] ED and was discharged and cleared from that perspective.  Based on our discussion today regarding his diagnosis and his mood, he plans to seek 2nd opinion and transfer his Oncology management to other location at [facility]. He states that he has no intent to cause self harm, he has no suicidal ideation or plans. He feels that he wants to live and fight / treat the cancer diagnosis.  He has already scheduled apt for 02/03/23 at [facility] with multi disciplinary team.  I encouraged him to keep that apt, and follow up with me about a week later to review.  I will forward his chart today to his [facility] Oncology team to notify them of patient's decision. He will re-schedule or cancel the upcoming apt with them. Based on his 2nd opinion with [facility], he may reconsider [facility] but he would like the other evaluation first.  No orders of the defined types were placed in this encounter.     Follow up plan: Return in about 3 weeks (around 02/09/2023) for 3 weeks follow-up Oncology updates (after [facility] visit).   [name], DO [facility] 01/19/2023, 10:00 AM

## 2023-01-20 ENCOUNTER — Telehealth: Payer: Self-pay | Admitting: *Deleted

## 2023-01-20 ENCOUNTER — Ambulatory Visit: Payer: 59 | Admitting: Internal Medicine

## 2023-01-20 ENCOUNTER — Other Ambulatory Visit: Payer: Self-pay | Admitting: Internal Medicine

## 2023-01-20 NOTE — Telephone Encounter (Signed)
Call placed to patient to follow up on where he plans to receive care. Referral has been sent to [facility] Oncology per patient request by primary care physician. Per documentation this visit is scheduled on 5/23. Patient does not wish to keep follow up appointments with medical oncology or radiation oncology at this time. All future appointments at [facility] can be canceled.

## 2023-01-21 ENCOUNTER — Telehealth: Payer: Self-pay

## 2023-01-21 NOTE — Telephone Encounter (Signed)
Transition Care Management Unsuccessful Follow-up Telephone Call  Date of discharge and from where:  01/17/2023 [facility]  Attempts:  1st Attempt  Reason for unsuccessful TCM follow-up call:  Voice mail full  Keno Caraway [facility]  [facility] Population Health Community Resource Care Guide   [email or web address]  ?? [phone]   Website: [email or web address]

## 2023-01-24 ENCOUNTER — Ambulatory Visit: Payer: 59

## 2023-01-25 ENCOUNTER — Inpatient Hospital Stay: Payer: 59 | Admitting: Internal Medicine

## 2023-01-25 ENCOUNTER — Inpatient Hospital Stay: Payer: 59

## 2023-01-25 ENCOUNTER — Ambulatory Visit: Payer: 59

## 2023-01-25 DIAGNOSIS — C689 Malignant neoplasm of urinary organ, unspecified: Secondary | ICD-10-CM | POA: Diagnosis not present

## 2023-01-26 ENCOUNTER — Ambulatory Visit: Payer: 59

## 2023-01-26 ENCOUNTER — Telehealth: Payer: Self-pay

## 2023-01-26 ENCOUNTER — Other Ambulatory Visit: Payer: Self-pay | Admitting: Internal Medicine

## 2023-01-26 DIAGNOSIS — E7800 Pure hypercholesterolemia, unspecified: Secondary | ICD-10-CM

## 2023-01-26 NOTE — Telephone Encounter (Signed)
Requested Prescriptions  Pending Prescriptions Disp Refills   atorvastatin (LIPITOR) 20 MG tablet [Pharmacy Med Name: Atorvastatin Calcium 20 MG Oral Tablet] 90 tablet 0    Sig: TAKE 1 TABLET BY MOUTH DAILY AT  6PM     Cardiovascular:  Antilipid - Statins Failed - 01/26/2023  5:43 AM      Failed - Lipid Panel in normal range within the last 12 months    Cholesterol, Total  Date Value Ref Range Status  04/20/2017 113 100 - 199 mg/dL Final   Cholesterol  Date Value Ref Range Status  09/23/2022 196 <200 mg/dL Final   LDL Cholesterol (Calc)  Date Value Ref Range Status  09/23/2022 128 (H) mg/dL (calc) Final    Comment:    Reference range: <100 . Desirable range <100 mg/dL for primary prevention;   <70 mg/dL for patients with CHD or diabetic patients  with > or = 2 CHD risk factors. Marland Kitchen LDL-C is now calculated using the Martin-Hopkins  calculation, which is a validated novel method providing  better accuracy than the Friedewald equation in the  estimation of LDL-C.  Horald Pollen et al. Lenox Ahr. 0981;191(47): 2061-2068  (http://education.[employer].com/faq/FAQ164)    HDL  Date Value Ref Range Status  09/23/2022 53 > OR = 40 mg/dL Final  82/95/6213 26 (L) >39 mg/dL Final   Triglycerides  Date Value Ref Range Status  09/23/2022 56 <150 mg/dL Final         Passed - Patient is not pregnant      Passed - Valid encounter within last 12 months    Recent Outpatient Visits           1 week ago Urothelial carcinoma [facility])   [place] [facility] Smitty Cords, DO   1 month ago Testicular swelling, right   [place] [facility] [place], Salvadore Oxford, NP   3 months ago Urinary retention   [place] [facility] [place], Salvadore Oxford, NP   4 months ago Encounter for general adult medical examination with abnormal findings   [place] [facility] [place], Salvadore Oxford, NP   7 months ago Chronic right shoulder pain    [place] [place], Salvadore Oxford, NP       Future Appointments             In 2 weeks Althea Charon, Netta Neat, DO [place] [facility], [facility]   In 1 month Richardo Hanks, Laurette Schimke, MD [facility] [place]

## 2023-01-26 NOTE — Telephone Encounter (Signed)
Transition Care Management Unsuccessful Follow-up Telephone Call  Date of discharge and from where:  01/17/2023 [facility]  Attempts:  2nd Attempt  Reason for unsuccessful TCM follow-up call:  Unable to leave message Voicemail full.  Addylynn Balin [facility]  [facility] Population Health Community Resource Care Guide   [email or web address]  ?? [phone]   Website: [email or web address]

## 2023-01-27 ENCOUNTER — Ambulatory Visit: Payer: 59

## 2023-01-28 ENCOUNTER — Ambulatory Visit: Payer: 59

## 2023-01-28 ENCOUNTER — Inpatient Hospital Stay: Payer: 59

## 2023-01-28 DIAGNOSIS — E785 Hyperlipidemia, unspecified: Secondary | ICD-10-CM | POA: Diagnosis not present

## 2023-01-28 DIAGNOSIS — M48061 Spinal stenosis, lumbar region without neurogenic claudication: Secondary | ICD-10-CM | POA: Diagnosis not present

## 2023-01-30 ENCOUNTER — Other Ambulatory Visit: Payer: Self-pay | Admitting: Internal Medicine

## 2023-01-30 DIAGNOSIS — N401 Enlarged prostate with lower urinary tract symptoms: Secondary | ICD-10-CM

## 2023-01-31 ENCOUNTER — Ambulatory Visit: Payer: 59

## 2023-02-01 ENCOUNTER — Ambulatory Visit: Payer: 59

## 2023-02-01 DIAGNOSIS — C679 Malignant neoplasm of bladder, unspecified: Secondary | ICD-10-CM | POA: Diagnosis not present

## 2023-02-01 DIAGNOSIS — R599 Enlarged lymph nodes, unspecified: Secondary | ICD-10-CM | POA: Diagnosis not present

## 2023-02-01 DIAGNOSIS — R911 Solitary pulmonary nodule: Secondary | ICD-10-CM | POA: Diagnosis not present

## 2023-02-01 DIAGNOSIS — N3289 Other specified disorders of bladder: Secondary | ICD-10-CM | POA: Diagnosis not present

## 2023-02-01 NOTE — Telephone Encounter (Signed)
Requested medication (s) are due for refill today:  For review  Requested medication (s) are on the active medication list: yes    Last refill: 01/06/23    Amount  not specified  Future visit scheduled yes 02/09/23  Notes to clinic:Historical provider, please review. Thank you.  Requested Prescriptions  Pending Prescriptions Disp Refills   terazosin (HYTRIN) 5 MG capsule [Pharmacy Med Name: Terazosin HCl 5 MG Oral Capsule] 90 capsule 3    Sig: TAKE 1 CAPSULE BY MOUTH DAILY     Cardiovascular:  Alpha Blockers Failed - 01/30/2023 10:47 PM      Failed - Last BP in normal range    BP Readings from Last 1 Encounters:  01/19/23 (!) 144/80         Passed - Valid encounter within last 6 months    Recent Outpatient Visits           1 week ago Urothelial carcinoma [facility])   [place] [facility] [place], Netta Neat, DO   1 month ago Testicular swelling, right   [place] [facility] [place], Salvadore Oxford, NP   3 months ago Urinary retention   [place] [facility] [place], Salvadore Oxford, NP   4 months ago Encounter for general adult medical examination with abnormal findings   [place] [facility] [place], Salvadore Oxford, NP   8 months ago Chronic right shoulder pain    [facility] [place], Salvadore Oxford, NP       Future Appointments             In 1 week Althea Charon, Netta Neat, DO  [facility], [facility]   In 4 weeks Richardo Hanks, Laurette Schimke, MD [facility] [place]

## 2023-02-02 ENCOUNTER — Ambulatory Visit: Payer: 59

## 2023-02-03 ENCOUNTER — Ambulatory Visit: Payer: 59

## 2023-02-03 DIAGNOSIS — C688 Malignant neoplasm of overlapping sites of urinary organs: Secondary | ICD-10-CM | POA: Diagnosis not present

## 2023-02-03 DIAGNOSIS — C689 Malignant neoplasm of urinary organ, unspecified: Secondary | ICD-10-CM | POA: Diagnosis not present

## 2023-02-04 ENCOUNTER — Ambulatory Visit: Payer: 59

## 2023-02-08 ENCOUNTER — Ambulatory Visit: Payer: 59

## 2023-02-09 ENCOUNTER — Other Ambulatory Visit: Payer: Self-pay | Admitting: Internal Medicine

## 2023-02-09 ENCOUNTER — Ambulatory Visit: Payer: 59

## 2023-02-09 ENCOUNTER — Ambulatory Visit: Payer: 59 | Admitting: Family Medicine

## 2023-02-09 DIAGNOSIS — I10 Essential (primary) hypertension: Secondary | ICD-10-CM

## 2023-02-09 NOTE — Telephone Encounter (Signed)
Rx no longer on current medication list Requested Prescriptions  Pending Prescriptions Disp Refills   lisinopril-hydrochlorothiazide (ZESTORETIC) 10-12.5 MG tablet [Pharmacy Med Name: Lisinopril-hydroCHLOROthiazide 10-12.5 MG Oral Tablet] 100 tablet 2    Sig: TAKE 1 TABLET BY MOUTH DAILY     Cardiovascular:  ACEI + Diuretic Combos Failed - 02/09/2023  5:48 AM      Failed - K in normal range and within 180 days    Potassium  Date Value Ref Range Status  01/17/2023 3.4 (L) 3.5 - 5.1 mmol/L Final         Failed - Last BP in normal range    BP Readings from Last 1 Encounters:  01/19/23 (!) 144/80         Passed - Na in normal range and within 180 days    Sodium  Date Value Ref Range Status  01/17/2023 139 135 - 145 mmol/L Final  04/20/2017 140 134 - 144 mmol/L Final         Passed - Cr in normal range and within 180 days    Creat  Date Value Ref Range Status  09/23/2022 1.04 0.70 - 1.22 mg/dL Final   Creatinine, Ser  Date Value Ref Range Status  01/17/2023 0.67 0.61 - 1.24 mg/dL Final         Passed - eGFR is 30 or above and within 180 days    GFR, Est African American  Date Value Ref Range Status  03/09/2021 98 > OR = 60 mL/min/1.24m2 Final   GFR, Est Non African American  Date Value Ref Range Status  03/09/2021 85 > OR = 60 mL/min/1.63m2 Final   GFR, Estimated  Date Value Ref Range Status  01/17/2023 >60 >60 mL/min Final    Comment:    (NOTE) Calculated using the CKD-EPI Creatinine Equation (2021)    eGFR  Date Value Ref Range Status  09/23/2022 72 > OR = 60 mL/min/1.31m2 Final         Passed - Patient is not pregnant      Passed - Valid encounter within last 6 months    Recent Outpatient Visits           3 weeks ago Urothelial carcinoma [facility])   [place] [facility] Smitty Cords, DO   2 months ago Testicular swelling, right   [place] [facility] [place], Salvadore Oxford, NP   3 months ago Urinary retention    [place] [facility] [place], Salvadore Oxford, NP   4 months ago Encounter for general adult medical examination with abnormal findings   [facility] [facility] [place], Salvadore Oxford, NP   8 months ago Chronic right shoulder pain    [facility] [place], Salvadore Oxford, NP       Future Appointments             In 3 weeks Richardo Hanks, Laurette Schimke, MD [facility] [place]

## 2023-02-10 ENCOUNTER — Ambulatory Visit: Payer: 59

## 2023-02-11 ENCOUNTER — Ambulatory Visit: Payer: 59

## 2023-02-14 ENCOUNTER — Ambulatory Visit: Payer: 59

## 2023-02-15 ENCOUNTER — Ambulatory Visit: Payer: 59

## 2023-02-16 ENCOUNTER — Ambulatory Visit: Payer: 59

## 2023-02-16 NOTE — Progress Notes (Deleted)
Referring Physician:  Lorre Munroe, NP [address] [place],  [place] [address]  Primary Physician:  Smitty Cords, DO  History of Present Illness: 02/16/2023 [name] has a history of hyperlipidemia, GERD, HTN.   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   He was doing better at his last visit with no LBP. Weakness in legs was improving with PT. He was to continue with PT at SNF.   He is here for follow up.         Seen by Dr. 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 PT/OT was recommended.   He is here for follow up.   He has been doing PT at [facility] and feels like his weakness is better. He has intermittent back pain x 10 years. No leg pain.   He is hoping to go home soon. He was told he may have cancer- not sure if in abdomen or prostate. He has scan and f/u next week.   He has history of dens fracture- thinks this happened in his 45s. Had been told about it multiple times in the past. No neck or arm pain. No thoracic pain.   Bowel/Bladder Dysfunction: none  Conservative measures:  Physical therapy: currently doing at [facility] and he does fill like this is helping Multimodal medical therapy including regular antiinflammatories: baclofen  Injections: has not received any epidural steroid injections  Past Surgery: denies  Joe Hardy has no symptoms of cervical myelopathy.  The symptoms are causing a significant impact on the patient's life.   Review of Systems:  A 10 point review of systems is negative, except for the pertinent positives and negatives detailed in the HPI.  Past Medical History: Past Medical History:  Diagnosis Date   Bladder stones    Depression    GERD (gastroesophageal reflux disease)    Hyperlipidemia    Hypertension    Iron deficiency anemia    Migraine     Sleep apnea     Past Surgical History: Past Surgical History:  Procedure Laterality Date   COLONOSCOPY     CYSTOSCOPY WITH LITHOLAPAXY N/A 11/26/2022   Procedure: CYSTOSCOPY WITH LITHOLAPAXY;  Surgeon: Sondra Come, MD;  Location: [facility] ORS;  Service: Urology;  Laterality: N/A;   HOLEP-LASER ENUCLEATION OF THE PROSTATE WITH MORCELLATION N/A 11/26/2022   Procedure: HOLEP-LASER ENUCLEATION OF THE PROSTATE WITH MORCELLATION;  Surgeon: Sondra Come, MD;  Location: [facility] ORS;  Service: Urology;  Laterality: N/A;   oral surgery     TONSILLECTOMY      Allergies: Allergies as of 02/17/2023 - Review Complete 01/19/2023  Allergen Reaction Noted   Fluoxetine  04/14/2015   Venlafaxine  04/14/2015    Medications: Outpatient Encounter Medications as of 02/17/2023  Medication Sig   Ascorbic Acid (VITAMIN C) 1000 MG tablet Take 1,000 mg by mouth daily.   atorvastatin (LIPITOR) 20 MG tablet TAKE 1 TABLET BY MOUTH DAILY AT  6PM   b complex vitamins tablet Take 1 tablet by mouth daily.   baclofen (LIORESAL) 10 MG tablet Take 5 mg by mouth 2 (two) times daily.   Blood Pressure Monitoring (SM WRIST CUFF BP MONITOR) MISC 1 Units by Does not apply route 2 (two) times a week.   buPROPion (WELLBUTRIN XL) 150 MG 24 hr tablet Take 450 mg by mouth daily.  busPIRone (BUSPAR) 10 MG tablet Take 1 tablet (10 mg total) by mouth 2 (two) times daily.   Cholecalciferol (VITAMIN D) 50 MCG (2000 UT) tablet Take 4,000 Units by mouth daily.   Cobalamin Combinations (NEURIVA PLUS PO) Take 1 capsule by mouth daily in the afternoon.   cyanocobalamin 1000 MCG tablet Take 1,000 mcg by mouth daily.   GARLIC PO Take 2 tablets by mouth daily.   Iron, Ferrous Sulfate, 325 (65 Fe) MG TABS Take 1 tablet by mouth daily.   lactulose, encephalopathy, (CHRONULAC) 10 GM/15ML SOLN SMARTSIG:Milliliter(s) By Mouth   levothyroxine (SYNTHROID) 75 MCG tablet TAKE 1 TABLET BY MOUTH DAILY  BEFORE BREAKFAST   magnesium 30 MG tablet Take 1  tablet (30 mg total) by mouth daily.   MIRALAX 17 GM/SCOOP powder Take 17 g by mouth daily.   mirtazapine (REMERON) 30 MG tablet Take 30 mg by mouth at bedtime.   polyethylene glycol (MIRALAX / GLYCOLAX) 17 g packet Take 17 g by mouth daily.   Saw Palmetto 450 MG CAPS Take 450 mg by mouth daily.   senna-docusate (SENOKOT-S) 8.6-50 MG tablet Take 1 tablet by mouth daily.   terazosin (HYTRIN) 5 MG capsule TAKE 1 CAPSULE BY MOUTH DAILY   traZODone (DESYREL) 150 MG tablet Take 150 mg by mouth at bedtime.   No facility-administered encounter medications on file as of 02/17/2023.    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    Awake, alert, oriented to person, place, and time.  Speech is clear and fluent. Fund of knowledge is appropriate.   Cranial Nerves: Pupils equal round and reactive to light.  Facial tone is symmetric.    No posterior cervical tenderness.   No abnormal lesions on exposed skin.   Strength: Side Biceps Triceps Deltoid Interossei Grip Wrist Ext. Wrist Flex.  R 5 5 5 5 5 5 5   L 5 5 5 5 5 5 5    Side Iliopsoas Quads Hamstring PF DF EHL  R 5 5 5 5 5 5   L 5 5 5 5 5 5    Reflexes are 2+ and symmetric at  the biceps, triceps, brachioradialis, patella and achilles.   Hoffman's is absent.  Clonus is not present.   Bilateral upper and lower extremity sensation is intact to light touch.     Gait not tested. He is in a WC.   Medical Decision Making  Imaging: none  Assessment and Plan: [name] is a pleasant 82 y.o. male has seen improvement since he was in the hospital.  He has intermittent LBP x years (none currently). Weakness in legs has improved with PT.   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   Treatment options discussed with patient and following plan made:   - Continue current PT at SNF. Can consider outpatient PT once he leaves.  - Hopefully, he will see continued improvement.  - If he gets worse, consider referral for lumbar injections.  - Follow up with me in 6-8 weeks for recheck.   I spent a total of *** minutes in face-to-face and non-face-to-face activities related to this patient's care today including review of outside records, review of imaging, review of symptoms, physical exam, discussion of differential diagnosis, discussion of treatment options, and documentation.   Drake Leach PA-C Dept. of Neurosurgery

## 2023-02-17 ENCOUNTER — Ambulatory Visit: Payer: 59

## 2023-02-17 ENCOUNTER — Ambulatory Visit: Payer: 59 | Admitting: Orthopedic Surgery

## 2023-02-18 ENCOUNTER — Ambulatory Visit: Payer: 59

## 2023-02-21 ENCOUNTER — Other Ambulatory Visit: Payer: Self-pay | Admitting: Internal Medicine

## 2023-02-21 ENCOUNTER — Ambulatory Visit: Payer: 59

## 2023-02-21 DIAGNOSIS — E034 Atrophy of thyroid (acquired): Secondary | ICD-10-CM

## 2023-02-22 ENCOUNTER — Ambulatory Visit: Payer: 59

## 2023-02-22 NOTE — Telephone Encounter (Signed)
Requested Prescriptions  Pending Prescriptions Disp Refills   levothyroxine (SYNTHROID) 75 MCG tablet [Pharmacy Med Name: Levothyroxine Sodium 75 MCG Oral Tablet] 90 tablet 2    Sig: TAKE 1 TABLET BY MOUTH DAILY  BEFORE BREAKFAST     Endocrinology:  Hypothyroid Agents Passed - 02/21/2023  4:35 AM      Passed - TSH in normal range and within 360 days    TSH  Date Value Ref Range Status  12/10/2022 2.789 0.350 - 4.500 uIU/mL Final    Comment:    Performed by a 3rd Generation assay with a functional sensitivity of <=0.01 uIU/mL. Performed at [facility], [address] [place], [place] [address]   09/23/2022 2.78 0.40 - 4.50 mIU/L Final         Passed - Valid encounter within last 12 months    Recent Outpatient Visits           1 month ago Urothelial carcinoma [facility])   [place] [facility] Smitty Cords, DO   2 months ago Testicular swelling, right   [place] [facility] [place], Salvadore [place], NP   3 months ago Urinary retention   [place] [facility] [place], Salvadore Oxford, NP   5 months ago Encounter for general adult medical examination with abnormal findings   [place] [facility] [place], Salvadore [place], NP   8 months ago Chronic right shoulder pain   [place] [facility] [place], Salvadore Oxford, NP       Future Appointments             In 1 week Richardo Hanks, Laurette Schimke, MD [facility]

## 2023-02-23 ENCOUNTER — Ambulatory Visit: Payer: 59

## 2023-02-24 ENCOUNTER — Ambulatory Visit: Payer: 59

## 2023-02-25 ENCOUNTER — Ambulatory Visit: Payer: 59

## 2023-02-28 ENCOUNTER — Ambulatory Visit: Payer: 59

## 2023-03-01 ENCOUNTER — Ambulatory Visit: Payer: 59

## 2023-03-02 ENCOUNTER — Ambulatory Visit: Payer: 59

## 2023-03-02 ENCOUNTER — Ambulatory Visit (INDEPENDENT_AMBULATORY_CARE_PROVIDER_SITE_OTHER): Payer: 59 | Admitting: Urology

## 2023-03-02 ENCOUNTER — Encounter: Payer: Self-pay | Admitting: Urology

## 2023-03-02 VITALS — BP 167/96 | HR 77 | Ht 69.0 in | Wt 170.0 lb

## 2023-03-02 DIAGNOSIS — N138 Other obstructive and reflux uropathy: Secondary | ICD-10-CM | POA: Diagnosis not present

## 2023-03-02 DIAGNOSIS — N401 Enlarged prostate with lower urinary tract symptoms: Secondary | ICD-10-CM

## 2023-03-02 DIAGNOSIS — C689 Malignant neoplasm of urinary organ, unspecified: Secondary | ICD-10-CM

## 2023-03-02 LAB — BLADDER SCAN AMB NON-IMAGING

## 2023-03-02 NOTE — Progress Notes (Signed)
   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  He has an extensive psych history, lives alone with poor social support, and is quite frail.  He originally presented with Foley dependent urinary retention in February 2024 and failed a voiding trial despite Flomax.  He underwent a cystoscopy and CT for further evaluation which showed a normal-appearing bladder, enlarged prostate measuring 96 g on CT, and a solitary pathologically enlarged lymph node in the anterior left pelvis.  PSA was normal at 1.99, and he was referred to oncology for further evaluation of the solitary node.  In terms of his urinary symptoms, he opted for a HOLEP to resume spontaneous voiding.  He underwent a HOLEP and cystolitholapaxy on 11/26/2022.  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  He also had multiple ER visits over the next week for unclear reasons and problems with the catheter.  He has been voiding spontaneously since Foley was removed and from a urinary perspective is doing well.  He denies any incontinence, urinating with a strong stream.  No recurrent gross hematuria.  He was referred to oncology as well as radiation oncology for consideration of Tri modal therapy in the setting of his frailty and other comorbidities.  He apparently had a bad interaction with some of those physicians, and ultimately sought a second opinion with [facility] uro-oncology.  I reviewed those outside notes.  It sounds like they were planning to discuss his case in tumor board, and he has follow-up with them in the next few weeks to hopefully finalize a treatment plan in terms of either surgical treatment with radical  cystoprostatectomy and urinary diversion, or chemoradiation with close surveillance.  There is no evidence of metastatic disease on chest CT, however a PET scan was ordered by [facility] and has not yet been completed.  We discussed the importance of following up with oncology/radiation oncology at [facility].  Realistically with his poor social support and frailty, as well as psychiatric history, I do think he would do better with chemotherapy and radiation as opposed to cystoprostatectomy.  Keep follow-up with [facility] regarding urothelial cell cancer metastatic to pelvic lymph node and treatment options RTC with me 9 months PVR  I spent 45 total minutes on the day of the encounter including pre-visit review of the medical record, face-to-face time with the patient, and post visit ordering of labs/imaging/tests.   Sondra Come, MD  [facility] [address], [address] [place], [place] [address] [phone]

## 2023-03-03 ENCOUNTER — Other Ambulatory Visit: Payer: Self-pay

## 2023-03-03 ENCOUNTER — Ambulatory Visit: Payer: 59

## 2023-03-04 ENCOUNTER — Ambulatory Visit: Payer: 59

## 2023-03-07 ENCOUNTER — Ambulatory Visit: Payer: 59

## 2023-03-08 ENCOUNTER — Ambulatory Visit: Payer: 59

## 2023-03-09 ENCOUNTER — Ambulatory Visit: Payer: 59

## 2023-03-10 ENCOUNTER — Ambulatory Visit: Payer: 59

## 2023-03-11 ENCOUNTER — Ambulatory Visit: Payer: 59

## 2023-03-11 DIAGNOSIS — C688 Malignant neoplasm of overlapping sites of urinary organs: Secondary | ICD-10-CM | POA: Diagnosis not present

## 2023-03-14 ENCOUNTER — Ambulatory Visit: Payer: 59

## 2023-03-16 DIAGNOSIS — C689 Malignant neoplasm of urinary organ, unspecified: Secondary | ICD-10-CM | POA: Diagnosis not present

## 2023-03-18 ENCOUNTER — Other Ambulatory Visit: Payer: Self-pay

## 2023-03-24 ENCOUNTER — Ambulatory Visit: Payer: 59 | Admitting: Internal Medicine

## 2023-03-24 ENCOUNTER — Encounter: Payer: Self-pay | Admitting: Family Medicine

## 2023-03-24 ENCOUNTER — Other Ambulatory Visit: Payer: Self-pay

## 2023-03-24 ENCOUNTER — Ambulatory Visit (INDEPENDENT_AMBULATORY_CARE_PROVIDER_SITE_OTHER): Payer: 59 | Admitting: Family Medicine

## 2023-03-24 VITALS — BP 132/76 | HR 94 | Temp 98.5°F | Ht 69.0 in | Wt 177.6 lb

## 2023-03-24 DIAGNOSIS — M62838 Other muscle spasm: Secondary | ICD-10-CM

## 2023-03-24 DIAGNOSIS — K5900 Constipation, unspecified: Secondary | ICD-10-CM

## 2023-03-24 DIAGNOSIS — G8929 Other chronic pain: Secondary | ICD-10-CM

## 2023-03-24 DIAGNOSIS — M25511 Pain in right shoulder: Secondary | ICD-10-CM | POA: Diagnosis not present

## 2023-03-24 DIAGNOSIS — M25811 Other specified joint disorders, right shoulder: Secondary | ICD-10-CM | POA: Diagnosis not present

## 2023-03-24 DIAGNOSIS — I10 Essential (primary) hypertension: Secondary | ICD-10-CM

## 2023-03-24 DIAGNOSIS — C689 Malignant neoplasm of urinary organ, unspecified: Secondary | ICD-10-CM | POA: Diagnosis not present

## 2023-03-24 MED ORDER — BUSPIRONE HCL 10 MG PO TABS: 10.0000 mg | ORAL_TABLET | Freq: Two times a day (BID) | ORAL | 1 refills | Status: DC

## 2023-03-24 MED ORDER — BACLOFEN 10 MG PO TABS
5.0000 mg | ORAL_TABLET | Freq: Two times a day (BID) | ORAL | 1 refills | Status: DC | PRN
Start: 2023-03-24 — End: 2023-04-27

## 2023-03-24 MED ORDER — SENNOSIDES-DOCUSATE SODIUM 8.6-50 MG PO TABS
1.0000 | ORAL_TABLET | Freq: Every day | ORAL | 2 refills | Status: AC
Start: 2023-03-24 — End: ?

## 2023-03-24 MED ORDER — LISINOPRIL-HYDROCHLOROTHIAZIDE 10-12.5 MG PO TABS: 1.0000 | ORAL_TABLET | Freq: Every day | ORAL | 1 refills | Status: DC
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
## 2023-03-24 NOTE — Patient Instructions (Addendum)
Thank you for coming to the office today.  Keep up with [facility] as planned for further management.  -----------------   Recommend heating pad for R Neck / Shoulder  Okay to keep taking Baclofen as needed  Tylenol as needed.  Future PT if needed.  --------------  See the next page for images describing the Epley Manuever.     ----------------------------------------------------------------------------------------------------------------------        Please schedule a Follow-up Appointment to: Return if symptoms worsen or fail to improve.  If you have any other questions or concerns, please feel free to call the office or send a message through MyChart. You may also schedule an earlier appointment if necessary.  Additionally, you may be receiving a survey about your experience at our office within a few days to 1 week by e-mail or mail. We value your feedback.  [name], DO [facility], [place]

## 2023-03-31 ENCOUNTER — Encounter: Payer: Self-pay | Admitting: Internal Medicine

## 2023-03-31 DIAGNOSIS — C689 Malignant neoplasm of urinary organ, unspecified: Secondary | ICD-10-CM | POA: Diagnosis not present

## 2023-04-07 DIAGNOSIS — C689 Malignant neoplasm of urinary organ, unspecified: Secondary | ICD-10-CM | POA: Diagnosis not present

## 2023-04-26 ENCOUNTER — Ambulatory Visit: Payer: Self-pay

## 2023-04-26 NOTE — Telephone Encounter (Signed)
Chief Complaint: Back Pain  Symptoms: Lower back pain  Frequency: comes and goes  Pertinent Negatives: Patient denies chest pain, bowel or bladder incontinence Disposition: [] ED /[] Urgent Care (no appt availability in office) / [x] Appointment(In office/virtual)/ []  [place] [facility]/ [] Home Care/ [] Refused Recommended Disposition /[] [facility]/ []  Follow-up with PCP Additional Notes: Patient stated that he has a hx of back pain but something happened Sunday afternoon that has caused severe lower back pain patient reports pain 8/10. Patient is unsure what happened or what is causing this new back pain. Patient stated that it is a sharp pain that only occurs when walking. Advised patient that he would need to be evaluated and patient was agreeable. Patient has been scheduled with PCP tomorrow at 1420. Care advice given and patient was advised to go to the ED if symptoms got worse. Patient verbalized understanding.  Reason for Disposition  High-risk adult (e.g., history of cancer, HIV, or IV drug use)  Answer Assessment - Initial Assessment Questions 1. ONSET: "When did the pain begin?"      Sunday afternoon 2. LOCATION: "Where does it hurt?" (upper, mid or lower back)     Lower back  3. SEVERITY: "How bad is the pain?"  (e.g., Scale 1-10; mild, moderate, or severe)   - MILD (1-3): Doesn't interfere with normal activities.    - MODERATE (4-7): Interferes with normal activities or awakens from sleep.    - SEVERE (8-10): Excruciating pain, unable to do any normal activities.      Severe 8/10 4. PATTERN: "Is the pain constant?" (e.g., yes, no; constant, intermittent)      Comes and goes  5. RADIATION: "Does the pain shoot into your legs or somewhere else?"     No 6. CAUSE:  "What do you think is causing the back pain?"      I'm not sure  7. BACK OVERUSE:  "Any recent lifting of heavy objects, strenuous work or exercise?"     No  8. MEDICINES: "What have you taken so far for  the pain?" (e.g., nothing, acetaminophen, NSAIDS)     Baclofen  9. NEUROLOGIC SYMPTOMS: "Do you have any weakness, numbness, or problems with bowel/bladder control?"     I have some weakness  10. OTHER SYMPTOMS: "Do you have any other symptoms?" (e.g., fever, abdomen pain, burning with urination, blood in urine)       Right side neck pain  Protocols used: Back Pain-A-AH

## 2023-04-27 ENCOUNTER — Encounter: Payer: Self-pay | Admitting: Family Medicine

## 2023-04-27 ENCOUNTER — Ambulatory Visit (INDEPENDENT_AMBULATORY_CARE_PROVIDER_SITE_OTHER): Payer: 59 | Admitting: Family Medicine

## 2023-04-27 VITALS — BP 139/87 | HR 103 | Temp 97.3°F | Ht 69.0 in | Wt 174.0 lb

## 2023-04-27 DIAGNOSIS — M542 Cervicalgia: Secondary | ICD-10-CM

## 2023-04-27 DIAGNOSIS — M62838 Other muscle spasm: Secondary | ICD-10-CM

## 2023-04-27 DIAGNOSIS — M5441 Lumbago with sciatica, right side: Secondary | ICD-10-CM

## 2023-04-27 DIAGNOSIS — M5136 Other intervertebral disc degeneration, lumbar region: Secondary | ICD-10-CM

## 2023-04-27 DIAGNOSIS — G8929 Other chronic pain: Secondary | ICD-10-CM

## 2023-04-27 DIAGNOSIS — M5442 Lumbago with sciatica, left side: Secondary | ICD-10-CM | POA: Diagnosis not present

## 2023-04-27 MED ORDER — CYCLOBENZAPRINE HCL 10 MG PO TABS
10.0000 mg | ORAL_TABLET | Freq: Three times a day (TID) | ORAL | 0 refills | Status: DC | PRN
Start: 2023-04-27 — End: 2023-06-07

## 2023-04-27 MED ORDER — PREDNISONE 20 MG PO TABS
ORAL_TABLET | ORAL | 0 refills | Status: AC
Start: 2023-04-27 — End: ?

## 2023-04-27 NOTE — Progress Notes (Signed)
Subjective:    Patient ID: Joe Hardy, male    DOB: July 14, 1941, 82 y.o.   MRN: [id number]  Joe Hardy is a 82 y.o. male presenting on 04/27/2023 for Back Pain (Lower back bilateral since Sunday has got worst even sitting down on couch can feel the pain./No urinary sx)  Patient presents for a same day appointment.  HPI  Chronic Low Back Pain, sciatica Neck Pain, chronic  He has had episodic flares, with back strain stiffness, he was having symptoms with significant pain after prolonged sitting, he had difficulty standing up he had low back SI region pain radiating across low back. He tried heating pad and vibration massage, some temporary relief. He has started using cane and walker at times with some relief. 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      04/27/2023    2:16 PM 03/24/2023   11:08 AM 01/19/2023    9:45 AM  Depression screen PHQ 2/9  Decreased Interest 2 1 2   Down, Depressed, Hopeless 2 1 3   PHQ - 2 Score 4 2 5   Altered sleeping 2 2 2   Tired, decreased energy 2 2 1   Change in appetite 2 0 1  Feeling bad or failure about yourself  1 2 2   Trouble concentrating 0 0 0  Moving slowly or fidgety/restless 0 1 0  Suicidal thoughts 0 1 1  PHQ-9 Score 11 10 12   Difficult doing work/chores Very difficult Somewhat difficult     Social History   Tobacco Use   Smoking status: Former    Current packs/day: 0.00    Average packs/day: 1.5 packs/day for 20.0 years (30.0 ttl pk-yrs)    Types: Cigarettes    Start date: 05/02/1997    Quit date: 05/02/2017    Years since quitting: 5.9    Passive exposure: Past   Smokeless tobacco: Former   Tobacco comments:    Starts and  stops related to anxiety rather than drinking alcohol  Vaping Use   Vaping status: Never Used  Substance Use Topics   Alcohol use: No    Alcohol/week: 0.0 standard drinks of alcohol    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  Constitutional:      General: He is not in acute distress.    Appearance: Normal appearance. He is well-developed. He is not diaphoretic.     Comments: Well-appearing, comfortable, cooperative  HENT:     Head: Normocephalic and atraumatic.  Eyes:     General:        Right eye: No discharge.  Left eye: No discharge.     Conjunctiva/sclera: Conjunctivae normal.  Neck:     Comments: Reduced range of motion neck L rotation Cardiovascular:     Rate and Rhythm: Normal rate.  Pulmonary:     Effort: Pulmonary effort is normal.  Musculoskeletal:     Cervical back: No rigidity.     Comments: Low back bilateral SI region with muscle hypertonicity spasm, some reduced range of motion.  Skin:    General: Skin is warm and dry.     Findings: No erythema or rash.  Neurological:     Mental Status: He is alert and oriented to person, place, and time.  Psychiatric:        Mood and Affect: Mood normal.        Behavior: Behavior normal.        Thought Content: Thought content normal.     Comments: Well groomed, good eye contact, normal speech and thoughts     Results for orders placed or performed in visit on 03/02/23  Bladder Scan (Post Void Residual) in office  Result Value Ref Range   Scan Result  59ml       Assessment & Plan:   Problem List Items Addressed This Visit   None Visit Diagnoses     Acute bilateral low back pain with bilateral sciatica    -  Primary   Relevant Medications   predniSONE (DELTASONE) 20 MG tablet   cyclobenzaprine (FLEXERIL) 10 MG tablet   Neck muscle spasm       Relevant Medications   cyclobenzaprine (FLEXERIL) 10 MG tablet   Chronic neck pain       Relevant Medications   predniSONE (DELTASONE) 20 MG tablet   cyclobenzaprine (FLEXERIL) 10 MG tablet   DDD (degenerative disc disease), lumbar       Relevant Medications   predniSONE (DELTASONE) 20 MG tablet   cyclobenzaprine (FLEXERIL) 10 MG tablet       For recent back pain and neck pain flare, may be muscle spasm and strain that flared up.  Start Prednisone taper over 7 days. It is a strong anti inflammatory, avoid any ibuprofen aleve nsaid.  Take daily with food. Start with 60mg  (3 pills) x 2 days, then reduce to 40mg  (2 pills) x 2 days, then 20mg  (1 pill) x 3 days   Stop Baclofen and switch med, since ineffective.  Start Cyclobenzapine (Flexeril) 10mg  tablets (muscle relaxant) - start with half (cut) to one whole pill at night for muscle relaxant - may make you sedated or sleepy (be careful driving or working on this) if tolerated you can take half to whole tab 2 to 3 times daily or every 8 hours as needed   Continue to follow-up with [facility] team for upcoming management and chemotherapy plan.  Meds ordered this encounter  Medications   predniSONE (DELTASONE) 20 MG tablet    Sig: Take daily with food. Start with 60mg  (3 pills) x 2 days, then reduce to 40mg  (2 pills) x 2 days, then 20mg  (1 pill) x 3 days    Dispense:  13 tablet    Refill:  0   cyclobenzaprine (FLEXERIL) 10 MG tablet    Sig: Take 1 tablet (10 mg total) by mouth 3 (three) times daily as needed for muscle spasms.    Dispense:  30 tablet    Refill:  0     Follow up plan: Return if symptoms worsen or fail to  improve.   Joe Pilar,  DO [facility] 04/27/2023, 2:26 PM

## 2023-04-27 NOTE — Patient Instructions (Addendum)
Thank you for coming to the office today.  For recent back pain and neck pain flare, may be muscle spasm and strain that flared up.  Start Prednisone taper over 7 days. It is a strong anti inflammatory, avoid any ibuprofen aleve nsaid.  Take daily with food. Start with 60mg  (3 pills) x 2 days, then reduce to 40mg  (2 pills) x 2 days, then 20mg  (1 pill) x 3 days   ------------------  Start Cyclobenzapine (Flexeril) 10mg  tablets (muscle relaxant) - start with half (cut) to one whole pill at night for muscle relaxant - may make you sedated or sleepy (be careful driving or working on this) if tolerated you can take half to whole tab 2 to 3 times daily or every 8 hours as needed  Please schedule a Follow-up Appointment to: Return if symptoms worsen or fail to improve.  If you have any other questions or concerns, please feel free to call the office or send a message through MyChart. You may also schedule an earlier appointment if necessary.  Additionally, you may be receiving a survey about your experience at our office within a few days to 1 week by e-mail or mail. We value your feedback.  [name], DO [facility], [place]

## 2023-04-28 DIAGNOSIS — C689 Malignant neoplasm of urinary organ, unspecified: Secondary | ICD-10-CM | POA: Diagnosis not present

## 2023-05-04 ENCOUNTER — Other Ambulatory Visit: Payer: Self-pay | Admitting: Internal Medicine

## 2023-05-04 DIAGNOSIS — E7800 Pure hypercholesterolemia, unspecified: Secondary | ICD-10-CM

## 2023-05-05 NOTE — Telephone Encounter (Signed)
Requested Prescriptions  Pending Prescriptions Disp Refills   atorvastatin (LIPITOR) 20 MG tablet [Pharmacy Med Name: Atorvastatin Calcium 20 MG Oral Tablet] 90 tablet 1    Sig: TAKE 1 TABLET BY MOUTH DAILY AT  6PM     Cardiovascular:  Antilipid - Statins Failed - 05/04/2023  4:56 AM      Failed - Lipid Panel in normal range within the last 12 months    Cholesterol, Total  Date Value Ref Range Status  04/20/2017 113 100 - 199 mg/dL Final   Cholesterol  Date Value Ref Range Status  09/23/2022 196 <200 mg/dL Final   LDL Cholesterol (Calc)  Date Value Ref Range Status  09/23/2022 128 (H) mg/dL (calc) Final    Comment:    Reference range: <100 . Desirable range <100 mg/dL for primary prevention;   <70 mg/dL for patients with CHD or diabetic patients  with > or = 2 CHD risk factors. Marland Kitchen LDL-C is now calculated using the Martin-Hopkins  calculation, which is a validated novel method providing  better accuracy than the Friedewald equation in the  estimation of LDL-C.  Horald Pollen et al. Lenox Ahr. 4098;119(14): 2061-2068  (http://education.[employer].com/faq/FAQ164)    HDL  Date Value Ref Range Status  09/23/2022 53 > OR = 40 mg/dL Final  78/29/5621 26 (L) >39 mg/dL Final   Triglycerides  Date Value Ref Range Status  09/23/2022 56 <150 mg/dL Final         Passed - Patient is not pregnant      Passed - Valid encounter within last 12 months    Recent Outpatient Visits           1 week ago Acute bilateral low back pain with bilateral sciatica   [place] [facility] Smitty Cords, DO   1 month ago Urothelial carcinoma [facility])   [place] [facility] Smitty Cords, DO   3 months ago Urothelial carcinoma [facility])   [place] [facility] Smitty Cords, DO   4 months ago Testicular swelling, right   [place] [facility], Salvadore Oxford, NP   6 months ago Urinary retention     [facility], Salvadore Oxford, NP       Future Appointments             In 3 months Richardo Hanks, Laurette Schimke, MD [facility] [place]

## 2023-05-06 ENCOUNTER — Ambulatory Visit: Payer: Self-pay | Admitting: *Deleted

## 2023-05-06 NOTE — Telephone Encounter (Signed)
  Chief Complaint: requesting if should continue last dose of prednisone due to possible side effects Symptoms: since starting prednisone, sx of memory deficits, nervousness, difficulty talking after taking medication. Difficulty recall of events. Not thinking clearly. Pain level better.  Frequency: since starting prednisone Pertinent Negatives: Patient denies weakness on either side of body. No chest pain or difficulty breathing . Disposition: [] ED /[] Urgent Care (no appt availability in office) / [] Appointment(In office/virtual)/ []  [place] [facility]/ [] Home Care/ [] Refused Recommended Disposition /[] [place] [facility]/ [x]  Follow-up with PCP Additional Notes:   Patient requesting to cut call short due to another call coming in from [facility]. Please advise if patient should take last dose of prednisone and if patient needs to go to [facility] or ED for sx. Please advise and call patient back on #[phone].   Summary: side effects   Patient called stated he has been hyper since taking the prednisone and he can not calm down. He wants to know if that is a side effect and what he can do to calm down. Please f/u with patient          Reason for Disposition  [1] Caller has URGENT medicine question about med that PCP or specialist prescribed AND [2] triager unable to answer question  Answer Assessment - Initial Assessment Questions 1. NAME of MEDICINE: "What medicine(s) are you calling about?"     prednisone 2. QUESTION: "What is your question?" (e.g., double dose of medicine, side effect)     Possible side effects of medication. Since taking prednisone sx of nervousness, not thinking clearly, difficulty talking due to feeling nervous.  3. PRESCRIBER: "Who prescribed the medicine?" Reason: if prescribed by specialist, call should be referred to that group.     PCP 4. SYMPTOMS: "Do you have any symptoms?" If Yes, ask: "What symptoms are you having?"  "How bad are the symptoms (e.g., mild,  moderate, severe)     See above 5. PREGNANCY:  "Is there any chance that you are pregnant?" "When was your last menstrual period?"     na  Protocols used: Medication Question Call-A-AH

## 2023-05-06 NOTE — Telephone Encounter (Signed)
Spoke with patient. He just wanted to make sure hyperactivity was normal with this medication. Explained this is a normal side effect. He has completed prednisone as of today. Feeling better today.  - Breaunna Gottlieb

## 2023-05-06 NOTE — Telephone Encounter (Signed)
Please contact patient back regarding his prednisone dose question.  Instructions were as follows  For Prednisone 20mg  tabs  Take daily with food. Start with 60mg  (3 pills) x 2 days, then reduce to 40mg  (2 pills) x 2 days, then 20mg  (1 pill) x 3 days   It was ordered on 04/27/23 as back up plan only if needed.  Yes the hyperactivity can be a side effect for sure.  It should improve as he continues to reduce the dose and stop the med.  If it helps his back he may choose to take lower dose and finish it quicker, rather than stop immediately.  He can try 2 pills (40mg ) for 1 day, then 1 pill (20mg ) for 2-3 days and then stop.  [name], DO [facility] 05/06/2023, 11:26 AM

## 2023-05-08 ENCOUNTER — Emergency Department
Admission: EM | Admit: 2023-05-08 | Discharge: 2023-05-08 | Disposition: A | Discharge status: Home / Self Care | Payer: 59 | Attending: Emergency Medicine | Admitting: Emergency Medicine

## 2023-05-08 ENCOUNTER — Other Ambulatory Visit: Payer: Self-pay

## 2023-05-08 ENCOUNTER — Emergency Department: Payer: 59

## 2023-05-08 DIAGNOSIS — Z8551 Personal history of malignant neoplasm of bladder: Secondary | ICD-10-CM | POA: Insufficient documentation

## 2023-05-08 DIAGNOSIS — I10 Essential (primary) hypertension: Secondary | ICD-10-CM | POA: Insufficient documentation

## 2023-05-08 DIAGNOSIS — R531 Weakness: Secondary | ICD-10-CM | POA: Insufficient documentation

## 2023-05-08 DIAGNOSIS — R262 Difficulty in walking, not elsewhere classified: Secondary | ICD-10-CM | POA: Diagnosis not present

## 2023-05-08 DIAGNOSIS — Z743 Need for continuous supervision: Secondary | ICD-10-CM | POA: Diagnosis not present

## 2023-05-08 DIAGNOSIS — I6782 Cerebral ischemia: Secondary | ICD-10-CM | POA: Diagnosis not present

## 2023-05-08 DIAGNOSIS — R6889 Other general symptoms and signs: Secondary | ICD-10-CM | POA: Diagnosis not present

## 2023-05-08 DIAGNOSIS — R29898 Other symptoms and signs involving the musculoskeletal system: Secondary | ICD-10-CM

## 2023-05-08 LAB — CBC WITH DIFFERENTIAL/PLATELET
Abs Immature Granulocytes: 0.12 10*3/uL — ABNORMAL HIGH (ref 0.00–0.07)
Basophils Absolute: 0.1 10*3/uL (ref 0.0–0.1)
Basophils Relative: 1 %
Eosinophils Absolute: 0.1 10*3/uL (ref 0.0–0.5)
Eosinophils Relative: 1 %
HCT: 42.3 % (ref 39.0–52.0)
Hemoglobin: 13.5 g/dL (ref 13.0–17.0)
Immature Granulocytes: 1 %
Lymphocytes Relative: 24 %
Lymphs Abs: 2 10*3/uL (ref 0.7–4.0)
MCH: 21.7 pg — ABNORMAL LOW (ref 26.0–34.0)
MCHC: 31.9 g/dL (ref 30.0–36.0)
MCV: 68 fL — ABNORMAL LOW (ref 80.0–100.0)
Monocytes Absolute: 0.7 10*3/uL (ref 0.1–1.0)
Monocytes Relative: 8 %
Neutro Abs: 5.5 10*3/uL (ref 1.7–7.7)
Neutrophils Relative %: 65 %
Platelets: 234 10*3/uL (ref 150–400)
RBC: 6.22 MIL/uL — ABNORMAL HIGH (ref 4.22–5.81)
RDW: 18 % — ABNORMAL HIGH (ref 11.5–15.5)
WBC: 8.5 10*3/uL (ref 4.0–10.5)
nRBC: 0 % (ref 0.0–0.2)

## 2023-05-08 LAB — T4, FREE: Free T4: 0.92 ng/dL (ref 0.61–1.12)

## 2023-05-08 LAB — COMPREHENSIVE METABOLIC PANEL
ALT: 16 U/L (ref 0–44)
AST: 17 U/L (ref 15–41)
Albumin: 3.7 g/dL (ref 3.5–5.0)
Alkaline Phosphatase: 36 U/L — ABNORMAL LOW (ref 38–126)
Anion gap: 7 (ref 5–15)
BUN: 18 mg/dL (ref 8–23)
CO2: 24 mmol/L (ref 22–32)
Calcium: 8.8 mg/dL — ABNORMAL LOW (ref 8.9–10.3)
Chloride: 101 mmol/L (ref 98–111)
Creatinine, Ser: 0.78 mg/dL (ref 0.61–1.24)
GFR, Estimated: >60 mL/min (ref >60)
Glucose, Bld: 92 mg/dL (ref 70–99)
Potassium: 3.5 mmol/L (ref 3.5–5.1)
Sodium: 132 mmol/L — ABNORMAL LOW (ref 135–145)
Total Bilirubin: 0.7 mg/dL (ref 0.3–1.2)
Total Protein: 6.1 g/dL — ABNORMAL LOW (ref 6.5–8.1)

## 2023-05-08 LAB — TSH: TSH: 2.394 u[IU]/mL (ref 0.350–4.500)

## 2023-05-08 MED ORDER — SODIUM CHLORIDE 0.9 % IV BOLUS
1000.0000 mL | Freq: Once | INTRAVENOUS | Status: AC
  Administered 2023-05-08: 1000 mL via INTRAVENOUS

## 2023-05-08 NOTE — ED Notes (Signed)
Pt ambulated with walker w/o any assistance, provider notified.

## 2023-05-08 NOTE — Discharge Instructions (Signed)
Your testing today was overall reassuring.  Follow with your primary care doctor for further evaluation.  Return to the ER for new or worsening symptoms.

## 2023-05-08 NOTE — ED Triage Notes (Signed)
[facility] reports pt coming from home c/o not being able to walk since last night before he went to bed around 2200. Friends came over today to help him get up and called 911. Pt states this happened about 5 months ago and he went to rehab for it. Denies any pain.

## 2023-05-08 NOTE — ED Provider Notes (Signed)
[facility] Provider Note    Event Date/Time   First MD Initiated Contact with Patient 05/08/23 [phone]     (approximate)   History   Weakness   HPI  Joe Hardy is a 82 y.o. male with history of HTN, urothelial carcinoma presenting to the emergency department for evaluation of weakness.  Patient reports that he was active yesterday.  Felt some weakness in his lower extremities last night before he went to bed.  This morning, had difficulty standing up.  No bowel or bladder incontinence, no fevers, no syncope.  Reports this feels similar to when he was admitted earlier this year, does not think a specific pathology was identified, but was discharged to rehab after which she did have improvement in his walking.  I did review his discharge summary from 12/14/2022.  At that time he presented with lower extremity weakness that was thought to be from likely deconditioning.  He had an MRI of his brain and spine without acute intracranial pathology or spinal cord compression.  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      Physical Exam   Triage Vital Signs: ED Triage Vitals  Encounter Vitals Group     BP 05/08/23 0918 (!) 157/92     Systolic BP Percentile --      Diastolic BP Percentile --      Pulse Rate 05/08/23 0918 95     Resp 05/08/23 0918 18     Temp 05/08/23 0918 98.2 F (36.8 C)     Temp Source 05/08/23 0918 Oral     SpO2 05/08/23 0918 97 %     Weight --      Height --      Head Circumference --      Peak Flow --      Pain Score 05/08/23 0920 0     Pain Loc --      Pain Education --      Exclude from Growth Chart --     Most recent vital signs: Vitals:   05/08/23 1100 05/08/23 1130  BP: (!) 173/122 (!) 172/106  Pulse: 98 100  Resp: (!) 26 13  Temp:    SpO2: 100% 98%     General: Awake, interactive  CV:  Regular rate, good  peripheral perfusion.  Resp:  Lungs clear, unlabored respirations.  Abd:  Soft, nondistended.  Neuro:  Alert and oriented, normal extraocular movements, symmetric facial movement, sensation intact over bilateral upper and lower extremities with 5 out of 5 strength.  Normal finger-to-nose testing.  Mildly impaired heel-to-shin testing bilaterally.  2+ patellar reflexes bilaterally.  No clonus.   ED Results / Procedures / Treatments   Labs (all labs ordered are listed, but only abnormal results are displayed) Labs Reviewed  COMPREHENSIVE METABOLIC PANEL - Abnormal; Notable for the following components:      Result Value   Sodium 132 (*)    Calcium 8.8 (*)    Total Protein 6.1 (*)    Alkaline Phosphatase 36 (*)    All other components within normal limits  CBC WITH DIFFERENTIAL/PLATELET - Abnormal; Notable for the following components:   RBC 6.22 (*)    MCV 68.0 (*)    MCH 21.7 (*)    RDW 18.0 (*)    Abs Immature Granulocytes 0.12 (*)    All other components within normal limits  TSH  T4, FREE     EKG EKG independently reviewed interpreted by myself (ER attending) demonstrates:  EKG demonstrate sinus rhythm at a rate of 95, PR 219, QRS 145, QTc 482, no acute ST changes  RADIOLOGY Imaging independently reviewed and interpreted by myself demonstrates:  CT head without acute bleed  PROCEDURES:  Critical Care performed: No  Procedures   MEDICATIONS ORDERED IN ED: Medications  sodium chloride 0.9 % bolus 1,000 mL (0 mLs Intravenous Stopped 05/08/23 1223)     IMPRESSION / MDM / ASSESSMENT AND PLAN / ED COURSE  I reviewed the triage vital signs and the nursing notes.  Differential diagnosis includes, but is not limited to, anemia, electrolyte abnormality, thyroid dysfunction, deconditioning, no history of trauma or red flag back pain symptoms suggestive of acute spinal cord compression  Patient's presentation is most consistent with acute presentation with potential  threat to life or bodily function.  82 year old male presenting to the emergency department for evaluation of weakness.  Will obtain labs, EKG, head CT to further evaluate.  No focal deficits on exam.  Last known well outside thrombolytic window, and no evidence of large vessel occlusion on exam.  No indication for code stroke activation.  Lab work without severe derangement.  CT head reassuring.  Patient reevaluated after IV fluids.  Does report some improvement in his symptoms.  He was able to ambulate with a walker with steady gait.  Given reassuring workup, do think he is stable for discharge home.  Patient comfortable this plan.  Strict return precautions provided.  Patient discharged in stable condition.    FINAL CLINICAL IMPRESSION(S) / ED DIAGNOSES   Final diagnoses:  Weakness of both lower extremities     Rx / DC Orders   ED Discharge Orders     None        Note:  This document was prepared using Dragon voice recognition software and may include unintentional dictation errors.   Trinna Post, MD 05/08/23 [phone]

## 2023-05-08 NOTE — ED Notes (Signed)
Helped pt use urinal and provided water to pt.

## 2023-05-19 ENCOUNTER — Telehealth: Payer: Self-pay

## 2023-05-19 NOTE — Telephone Encounter (Signed)
Transition Care Management Follow-up Telephone Call Date of discharge and from where: Joe Hardy 8/25 How have you been since you were released from the hospital? Pt is doing ok and doing chemo  Any questions or concerns? No  Items Reviewed: Did the pt receive and understand the discharge instructions provided? Yes  Medications obtained and verified? No  Other? No  Any new allergies since your discharge? No  Dietary orders reviewed? No Do you have support at home? No    Follow up appointments reviewed:  PCP Hospital f/u appt confirmed? No  Scheduled to see  on  @ . Specialist Hospital f/u appt confirmed? Yes  Scheduled to see  on  @ . Are transportation arrangements needed? No  If their condition worsens, is the pt aware to call PCP or go to the Emergency Dept.? Yes Was the patient provided with contact information for the PCP's office or ED? Yes Was to pt encouraged to call back with questions or concerns? Yes

## 2023-05-20 DIAGNOSIS — N390 Urinary tract infection, site not specified: Secondary | ICD-10-CM | POA: Diagnosis not present

## 2023-05-20 DIAGNOSIS — C679 Malignant neoplasm of bladder, unspecified: Secondary | ICD-10-CM | POA: Diagnosis not present

## 2023-05-20 DIAGNOSIS — Z510 Encounter for antineoplastic radiation therapy: Secondary | ICD-10-CM | POA: Diagnosis not present

## 2023-05-22 DIAGNOSIS — C679 Malignant neoplasm of bladder, unspecified: Secondary | ICD-10-CM | POA: Diagnosis not present

## 2023-05-22 DIAGNOSIS — N390 Urinary tract infection, site not specified: Secondary | ICD-10-CM | POA: Diagnosis not present

## 2023-05-22 DIAGNOSIS — Z510 Encounter for antineoplastic radiation therapy: Secondary | ICD-10-CM | POA: Diagnosis not present

## 2023-05-23 DIAGNOSIS — I10 Essential (primary) hypertension: Secondary | ICD-10-CM | POA: Diagnosis not present

## 2023-05-23 DIAGNOSIS — Z87891 Personal history of nicotine dependence: Secondary | ICD-10-CM | POA: Diagnosis not present

## 2023-05-23 DIAGNOSIS — N210 Calculus in bladder: Secondary | ICD-10-CM | POA: Diagnosis not present

## 2023-05-23 DIAGNOSIS — C689 Malignant neoplasm of urinary organ, unspecified: Secondary | ICD-10-CM | POA: Diagnosis not present

## 2023-05-23 DIAGNOSIS — Z79899 Other long term (current) drug therapy: Secondary | ICD-10-CM | POA: Diagnosis not present

## 2023-05-23 DIAGNOSIS — Z510 Encounter for antineoplastic radiation therapy: Secondary | ICD-10-CM | POA: Diagnosis not present

## 2023-05-23 DIAGNOSIS — Z5111 Encounter for antineoplastic chemotherapy: Secondary | ICD-10-CM | POA: Diagnosis not present

## 2023-05-23 DIAGNOSIS — C679 Malignant neoplasm of bladder, unspecified: Secondary | ICD-10-CM | POA: Diagnosis not present

## 2023-05-23 DIAGNOSIS — R338 Other retention of urine: Secondary | ICD-10-CM | POA: Diagnosis not present

## 2023-05-23 DIAGNOSIS — N390 Urinary tract infection, site not specified: Secondary | ICD-10-CM | POA: Diagnosis not present

## 2023-05-24 DIAGNOSIS — N390 Urinary tract infection, site not specified: Secondary | ICD-10-CM | POA: Diagnosis not present

## 2023-05-24 DIAGNOSIS — Z510 Encounter for antineoplastic radiation therapy: Secondary | ICD-10-CM | POA: Diagnosis not present

## 2023-05-24 DIAGNOSIS — C689 Malignant neoplasm of urinary organ, unspecified: Secondary | ICD-10-CM | POA: Diagnosis not present

## 2023-05-25 ENCOUNTER — Ambulatory Visit: Payer: Self-pay

## 2023-05-25 DIAGNOSIS — N390 Urinary tract infection, site not specified: Secondary | ICD-10-CM | POA: Diagnosis not present

## 2023-05-25 DIAGNOSIS — Z510 Encounter for antineoplastic radiation therapy: Secondary | ICD-10-CM | POA: Diagnosis not present

## 2023-05-25 DIAGNOSIS — C689 Malignant neoplasm of urinary organ, unspecified: Secondary | ICD-10-CM | POA: Diagnosis not present

## 2023-05-25 NOTE — Telephone Encounter (Signed)
Chief Complaint: Red bite to the inside of right upper thigh Symptoms: Nauseated earlier today, felt hyper today, no symptoms at this time Frequency: Noticed it yesterday Pertinent Negatives: Patient denies other symptoms Disposition: [] ED /[] Urgent Care (no appt availability in office) / [] Appointment(In office/virtual)/ []  [place] [facility]/ [x] Home Care/ [] Refused Recommended Disposition /[] [place] [facility]/ []  Follow-up with PCP Additional Notes: Patient says yesterday after taking his jeans off he noticed a red area with an indention in the middle like a bite, unsure of insect bite, about 1 in long and 1/3-1/2 in wide. He says it's no pain and no itching, but he's been applying hydrocortisone cream. He says it's no warmth different than the other part of his leg. Advised he would need to see someone and no availability with PCP, he only wants to see PCP. He says he has chemo and radiation tomorrow. Advised virtual UC, he says he wouldn't know how to set up a virtual visit. Advised I will send this to the office for Dr. Althea Charon to review due to no answer when called the office to check on availability this week. Advised someone will call back with his recommendation. Advised to wash the leg with soap and water, dry good, apply neosporin 3 times/day and hydrocortisone cream. If worsens to go to the UC, he verbalized understanding.   Reason for Disposition  Itchy insect bite  Answer Assessment - Initial Assessment Questions 1. TYPE of INSECT: "What type of insect was it?"      Unknown 2. ONSET: "When did you get bitten?"      Noticed it yesterday 3. LOCATION: "Where is the insect bite located?"      Inside right upper thigh 4. REDNESS: "Is the area red or pink?" If Yes, ask: "What size is area of redness?" (inches or cm). "When did the redness start?"     Redness started yesterday that I noticed 5. PAIN: "Is there any pain?" If Yes, ask: "How bad is it?"  (Scale 1-10; or mild,  moderate, severe)     No 6. ITCHING: "Does it itch?" If Yes, ask: "How bad is the itch?"    - MILD: doesn't interfere with normal activities   - MODERATE-SEVERE: interferes with work, school, sleep, or other activities      Mild but using hydrocortisone cream 7. SWELLING: "How big is the swelling?" (inches, cm, or compare to coins)     1 in long, 1/3-1/2 inch wide 8. OTHER SYMPTOMS: "Do you have any other symptoms?"  (e.g., difficulty breathing, hives)     Felt nauseated after lunch today, more hyper today  Protocols used: Insect Bite-A-AH

## 2023-05-26 DIAGNOSIS — Z510 Encounter for antineoplastic radiation therapy: Secondary | ICD-10-CM | POA: Diagnosis not present

## 2023-05-26 DIAGNOSIS — N390 Urinary tract infection, site not specified: Secondary | ICD-10-CM | POA: Diagnosis not present

## 2023-05-26 DIAGNOSIS — Z5111 Encounter for antineoplastic chemotherapy: Secondary | ICD-10-CM | POA: Diagnosis not present

## 2023-05-26 DIAGNOSIS — C689 Malignant neoplasm of urinary organ, unspecified: Secondary | ICD-10-CM | POA: Diagnosis not present

## 2023-05-26 NOTE — Telephone Encounter (Signed)
Agree with advice given.  Unfortunately Dr. Althea Charon is out of the office and he does not want to see me.  Would recommend urgent care if symptoms persist or worsen.

## 2023-05-27 DIAGNOSIS — C679 Malignant neoplasm of bladder, unspecified: Secondary | ICD-10-CM | POA: Diagnosis not present

## 2023-05-27 DIAGNOSIS — Z510 Encounter for antineoplastic radiation therapy: Secondary | ICD-10-CM | POA: Diagnosis not present

## 2023-05-27 DIAGNOSIS — N390 Urinary tract infection, site not specified: Secondary | ICD-10-CM | POA: Diagnosis not present

## 2023-05-30 ENCOUNTER — Telehealth: Payer: Self-pay | Admitting: Family Medicine

## 2023-05-30 DIAGNOSIS — Z5111 Encounter for antineoplastic chemotherapy: Secondary | ICD-10-CM | POA: Diagnosis not present

## 2023-05-30 DIAGNOSIS — Z510 Encounter for antineoplastic radiation therapy: Secondary | ICD-10-CM | POA: Diagnosis not present

## 2023-05-30 DIAGNOSIS — N390 Urinary tract infection, site not specified: Secondary | ICD-10-CM | POA: Diagnosis not present

## 2023-05-30 DIAGNOSIS — C689 Malignant neoplasm of urinary organ, unspecified: Secondary | ICD-10-CM | POA: Diagnosis not present

## 2023-05-30 DIAGNOSIS — C679 Malignant neoplasm of bladder, unspecified: Secondary | ICD-10-CM | POA: Diagnosis not present

## 2023-05-30 NOTE — Telephone Encounter (Signed)
[place] calling from [employer] calling to change the manufacturer for  levothyroxine (SYNTHROID) 75 MCG tablet [4422] levothyroxine (SYNTHROID) 75 MCG tablet [[id number]]   CB- [phone] Reference number [id number]

## 2023-05-30 NOTE — Telephone Encounter (Signed)
Yes that is fine to change the manufacturer for Levothyroxine.  [name], DO [facility] 05/30/2023, 5:05 PM

## 2023-05-31 NOTE — Telephone Encounter (Signed)
[employer] advised.   Thanks,   -Vernona Rieger

## 2023-06-01 ENCOUNTER — Ambulatory Visit: Payer: Self-pay | Admitting: *Deleted

## 2023-06-01 DIAGNOSIS — M542 Cervicalgia: Secondary | ICD-10-CM | POA: Diagnosis not present

## 2023-06-01 DIAGNOSIS — R110 Nausea: Secondary | ICD-10-CM | POA: Diagnosis not present

## 2023-06-01 DIAGNOSIS — C679 Malignant neoplasm of bladder, unspecified: Secondary | ICD-10-CM | POA: Diagnosis not present

## 2023-06-01 DIAGNOSIS — R531 Weakness: Secondary | ICD-10-CM | POA: Diagnosis not present

## 2023-06-01 NOTE — Telephone Encounter (Signed)
  Chief Complaint: dizziness Symptoms: dizziness, weakness Frequency: started today Pertinent Negatives: Patient denies fever, chest pain, vomiting, diarrhea, bleeding  Disposition: [] ED /[] Urgent Care (no appt availability in office) / [] Appointment(In office/virtual)/ []  [place] [facility]/ [] Home Care/ [x] Refused Recommended Disposition /[] [place] [facility]/ []  Follow-up with PCP Additional Notes: Patient states he has been having dizzy spells today- off/on. Patient has multiple diagnosis and is on several medications that could make him dizzy and fatigued. Advised UC as option for care and he will go to be seen.     Reason for Disposition  [1] MODERATE dizziness (e.g., interferes with normal activities) AND [2] has NOT been evaluated by doctor (or NP/PA) for this  (Exception: Dizziness caused by heat exposure, sudden standing, or poor fluid intake.)  Answer Assessment - Initial Assessment Questions 1. DESCRIPTION: "Describe your dizziness."     Cancer patient- feels weak all the time- patient states he went for radiation today and waited 45 minutes. The machine was broken and his treatment was canceled- he felt light headed and nauseous. Off balance. Patient rode the bus home and he felt a little better- until 1 hour ago. Feeling like he did this morning. Patient feels he may be having a medication reaction to medication for UTI. Patient also had reaction to prednisone- hyper,nervousness  2. LIGHTHEADED: "Do you feel lightheaded?" (e.g., somewhat faint, woozy, weak upon standing)     no 3. VERTIGO: "Do you feel like either you or the room is spinning or tilting?" (i.e. vertigo)     no 4. SEVERITY: "How bad is it?"  "Do you feel like you are going to faint?" "Can you stand and walk?"   - MILD: Feels slightly dizzy, but walking normally.   - MODERATE: Feels unsteady when walking, but not falling; interferes with normal activities (e.g., school, work).   - SEVERE: Unable to walk  without falling, or requires assistance to walk without falling; feels like passing out now.      Mild/moderate 5. ONSET:  "When did the dizziness begin?"     today 6. AGGRAVATING FACTORS: "Does anything make it worse?" (e.g., standing, change in head position)     Standing, walking 7. HEART RATE: "Can you tell me your heart rate?" "How many beats in 15 seconds?"  (Note: not all patients can do this)       164/94, 93 8. CAUSE: "What do you think is causing the dizziness?"     Bladder and prostate cancer treatment  9. RECURRENT SYMPTOM: "Have you had dizziness before?" If Yes, ask: "When was the last time?" "What happened that time?"     Patient feels he may be having reaction to steriod medication he takes for his chemo 10. OTHER SYMPTOMS: "Do you have any other symptoms?" (e.g., fever, chest pain, vomiting, diarrhea, bleeding)       Lack of appetite, weakness  Protocols used: Dizziness - Lightheadedness-A-AH

## 2023-06-01 NOTE — Telephone Encounter (Signed)
  Chief Complaint: dizziness Symptoms: multiple illness- cancer treatment, UTI, medications - antibiotic, chemo/steroids  Frequency: today Pertinent Negatives: Patient denies fever, chest pain, vomiting, diarrhea, bleeding Disposition: [] ED /[] Urgent Care (no appt availability in office) / [] Appointment(In office/virtual)/ []  [place] [facility]/ [] Home Care/ [x] Refused Recommended Disposition /[]  Mobile Bus/ []  Follow-up with PCP Additional Notes: Patient advised PCP will be out of office for next 2 days- offered to let Rene Kocher know and see if she can see him- patient declines- only wants to see PCP. Patient is going to go to UC to get checked

## 2023-06-02 DIAGNOSIS — C689 Malignant neoplasm of urinary organ, unspecified: Secondary | ICD-10-CM | POA: Diagnosis not present

## 2023-06-03 DIAGNOSIS — N390 Urinary tract infection, site not specified: Secondary | ICD-10-CM | POA: Diagnosis not present

## 2023-06-03 DIAGNOSIS — C679 Malignant neoplasm of bladder, unspecified: Secondary | ICD-10-CM | POA: Diagnosis not present

## 2023-06-03 DIAGNOSIS — Z510 Encounter for antineoplastic radiation therapy: Secondary | ICD-10-CM | POA: Diagnosis not present

## 2023-06-06 DIAGNOSIS — N390 Urinary tract infection, site not specified: Secondary | ICD-10-CM | POA: Diagnosis not present

## 2023-06-06 DIAGNOSIS — Z510 Encounter for antineoplastic radiation therapy: Secondary | ICD-10-CM | POA: Diagnosis not present

## 2023-06-06 DIAGNOSIS — D509 Iron deficiency anemia, unspecified: Secondary | ICD-10-CM | POA: Diagnosis not present

## 2023-06-06 DIAGNOSIS — C689 Malignant neoplasm of urinary organ, unspecified: Secondary | ICD-10-CM | POA: Diagnosis not present

## 2023-06-07 ENCOUNTER — Other Ambulatory Visit: Payer: Self-pay | Admitting: Family Medicine

## 2023-06-07 DIAGNOSIS — Z510 Encounter for antineoplastic radiation therapy: Secondary | ICD-10-CM | POA: Diagnosis not present

## 2023-06-07 DIAGNOSIS — C679 Malignant neoplasm of bladder, unspecified: Secondary | ICD-10-CM | POA: Diagnosis not present

## 2023-06-07 DIAGNOSIS — G8929 Other chronic pain: Secondary | ICD-10-CM

## 2023-06-07 DIAGNOSIS — N390 Urinary tract infection, site not specified: Secondary | ICD-10-CM | POA: Diagnosis not present

## 2023-06-07 DIAGNOSIS — M62838 Other muscle spasm: Secondary | ICD-10-CM

## 2023-06-07 NOTE — Telephone Encounter (Signed)
Medication Refill - Medication:  Flexeril 10 mg for back pain  Has the patient contacted their pharmacy? Yes.   (Agent: If no, request that the patient contact the pharmacy for the refill. If patient does not wish to contact the pharmacy document the reason why and proceed with request.) (Agent: If yes, when and what did the pharmacy advise?)  Preferred Pharmacy (with phone number or street name): [facility]  [address]  Has the patient been seen for an appointment in the last year OR does the patient have an upcoming appointment? Yes.    Agent: Please be advised that RX refills may take up to 3 business days. We ask that you follow-up with your pharmacy.

## 2023-06-08 DIAGNOSIS — C679 Malignant neoplasm of bladder, unspecified: Secondary | ICD-10-CM | POA: Diagnosis not present

## 2023-06-08 DIAGNOSIS — N390 Urinary tract infection, site not specified: Secondary | ICD-10-CM | POA: Diagnosis not present

## 2023-06-08 DIAGNOSIS — Z510 Encounter for antineoplastic radiation therapy: Secondary | ICD-10-CM | POA: Diagnosis not present

## 2023-06-08 MED ORDER — CYCLOBENZAPRINE HCL 10 MG PO TABS
10.0000 mg | ORAL_TABLET | Freq: Three times a day (TID) | ORAL | 0 refills | Status: AC | PRN
Start: 2023-06-08 — End: ?

## 2023-06-08 NOTE — Telephone Encounter (Signed)
Requested medication (s) are due for refill today: yes  Requested medication (s) are on the active medication list: yes  Last refill:  04/27/23 #30  Future visit scheduled:04/27/23 #30  Notes to clinic:  med not delegated to NT to RF   Requested Prescriptions  Pending Prescriptions Disp Refills   cyclobenzaprine (FLEXERIL) 10 MG tablet 30 tablet 0    Sig: Take 1 tablet (10 mg total) by mouth 3 (three) times daily as needed for muscle spasms.     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[facility])   [place] [facility] Smitty Cords, DO   4 months ago Urothelial carcinoma [facility])   [place] [facility] Smitty Cords, DO   6 months ago Testicular swelling, right   [place] [facility] [place], Salvadore Oxford, NP   7 months ago Urinary retention   [place] [facility] [place], Salvadore Oxford, NP       Future Appointments             In 1 week Althea Charon, Netta Neat, DO  [facility], [facility]   In 2 months Richardo Hanks, Laurette Schimke, MD [facility] Urology [facility]

## 2023-06-08 NOTE — Telephone Encounter (Signed)
Requested medication (s) are due for refill today: yes  Requested medication (s) are on the active medication list: yes  Last refill:  04/27/23  Future visit scheduled: yes  Notes to clinic:  Unable to refill per protocol, cannot delegate.      Requested Prescriptions  Pending Prescriptions Disp Refills   cyclobenzaprine (FLEXERIL) 10 MG tablet 30 tablet 0    Sig: Take 1 tablet (10 mg total) by mouth 3 (three) times daily as needed for muscle spasms.     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

## 2023-06-09 DIAGNOSIS — C689 Malignant neoplasm of urinary organ, unspecified: Secondary | ICD-10-CM | POA: Diagnosis not present

## 2023-06-09 DIAGNOSIS — Z5111 Encounter for antineoplastic chemotherapy: Secondary | ICD-10-CM | POA: Diagnosis not present

## 2023-06-13 DIAGNOSIS — C689 Malignant neoplasm of urinary organ, unspecified: Secondary | ICD-10-CM | POA: Diagnosis not present

## 2023-06-13 DIAGNOSIS — N390 Urinary tract infection, site not specified: Secondary | ICD-10-CM | POA: Diagnosis not present

## 2023-06-13 DIAGNOSIS — Z5111 Encounter for antineoplastic chemotherapy: Secondary | ICD-10-CM | POA: Diagnosis not present

## 2023-06-13 DIAGNOSIS — Z510 Encounter for antineoplastic radiation therapy: Secondary | ICD-10-CM | POA: Diagnosis not present

## 2023-06-14 DIAGNOSIS — Z510 Encounter for antineoplastic radiation therapy: Secondary | ICD-10-CM | POA: Diagnosis not present

## 2023-06-14 DIAGNOSIS — C679 Malignant neoplasm of bladder, unspecified: Secondary | ICD-10-CM | POA: Diagnosis not present

## 2023-06-15 ENCOUNTER — Ambulatory Visit (INDEPENDENT_AMBULATORY_CARE_PROVIDER_SITE_OTHER): Payer: 59 | Admitting: Family Medicine

## 2023-06-15 ENCOUNTER — Encounter: Payer: Self-pay | Admitting: Family Medicine

## 2023-06-15 VITALS — BP 134/82 | HR 82 | Ht 69.0 in | Wt 182.0 lb

## 2023-06-15 DIAGNOSIS — G8929 Other chronic pain: Secondary | ICD-10-CM | POA: Diagnosis not present

## 2023-06-15 DIAGNOSIS — M62838 Other muscle spasm: Secondary | ICD-10-CM

## 2023-06-15 DIAGNOSIS — C679 Malignant neoplasm of bladder, unspecified: Secondary | ICD-10-CM | POA: Diagnosis not present

## 2023-06-15 DIAGNOSIS — M542 Cervicalgia: Secondary | ICD-10-CM

## 2023-06-15 DIAGNOSIS — Z23 Encounter for immunization: Secondary | ICD-10-CM

## 2023-06-15 DIAGNOSIS — Z510 Encounter for antineoplastic radiation therapy: Secondary | ICD-10-CM | POA: Diagnosis not present

## 2023-06-15 MED ORDER — CYCLOBENZAPRINE HCL 10 MG PO TABS
10.0000 mg | ORAL_TABLET | Freq: Two times a day (BID) | ORAL | 1 refills | Status: DC
Start: 2023-06-15 — End: 2023-06-29

## 2023-06-15 NOTE — Progress Notes (Signed)
Subjective:    Patient ID: Joe Hardy, male    DOB: Dec 16, 1940, 82 y.o.   MRN: [id number]  Joe Hardy is a 82 y.o. male presenting on 06/15/2023 for Neck Pain (Arthritic pain)   HPI  Discussed the use of AI scribe software for clinical note transcription with the patient, who gave verbal consent to proceed.     The patient, undergoing concurrent chemotherapy and radiation treatment for an unspecified condition, presents with escalating neck pain and newly developed nausea.  The neck pain, which has been a chronic issue for the past 22 years, has recently intensified, prompting the patient to increase their Flexeril (muscle relaxant) intake to two pills daily. Recent UC visit for pain.  The patient also reports a recent onset of nausea, which they attribute to their ongoing chemo and radiation treatments. This is the first time the patient has experienced significant nausea since starting treatment, and they have begun taking Compazine to manage it. They still have appetite and are maintaining nutrition  The patient also reports taking two steroid pills on their chemotherapy days (Mondays and Thursdays), which they note increases their energy levels but also affects their speech and thought processes. The effects of the steroids are not immediate but tend to manifest two to three days after administration.  The patient has been managing their back pain with a heat and vibrating pad for approximately 30 minutes daily, which they report has significantly improved their symptoms. They inquire about the potential benefits of using a heating pad for their neck pain.  The patient's medication regimen includes Flexeril, Compazine, and a steroid, all of which they have been taking as directed. They recently requested a 90-day supply of Flexeril to be sent to their pharmacy.          06/15/2023    3:01 PM 04/27/2023    2:16 PM 03/24/2023   11:08 AM  Depression screen PHQ 2/9  Decreased  Interest 1 2 1   Down, Depressed, Hopeless 1 2 1   PHQ - 2 Score 2 4 2   Altered sleeping 0 2 2  Tired, decreased energy 2 2 2   Change in appetite 0 2 0  Feeling bad or failure about yourself   1 2  Trouble concentrating 0 0 0  Moving slowly or fidgety/restless 1 0 1  Suicidal thoughts 0 0 1  PHQ-9 Score 5 11 10   Difficult doing work/chores Somewhat difficult Very difficult Somewhat difficult      06/15/2023    3:01 PM 04/27/2023    2:16 PM 03/24/2023   11:09 AM 12/07/2022    1:57 PM  GAD 7 : Generalized Anxiety Score  Nervous, Anxious, on Edge 2 2 2 1   Control/stop worrying 1 2 2 3   Worry too much - different things 0 2 1 2   Trouble relaxing 1 2 2 1   Restless 0 2 1 0  Easily annoyed or irritable 0 2 1 1   Afraid - awful might happen 1 2 1 2   Total GAD 7 Score 5 14 10 10   Anxiety Difficulty  Very difficult Somewhat difficult Very difficult      Social History   Tobacco Use   Smoking status: Former    Current packs/day: 0.00    Average packs/day: 1.5 packs/day for 20.0 years (30.0 ttl pk-yrs)    Types: Cigarettes    Start date: 05/02/1997    Quit date: 05/02/2017    Years since quitting: 6.1    Passive exposure: Past  Smokeless tobacco: Former   Tobacco comments:    Starts and stops related to anxiety rather than drinking alcohol  Vaping Use   Vaping status: Never Used  Substance Use Topics   Alcohol use: No    Alcohol/week: 0.0 standard drinks of alcohol    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  Constitutional:      General: He is not in acute  distress.    Appearance: Normal appearance. He is well-developed. He is not diaphoretic.     Comments: Well-appearing, comfortable, cooperative  HENT:     Head: Normocephalic and atraumatic.  Eyes:     General:        Right eye: No discharge.        Left eye: No discharge.     Conjunctiva/sclera: Conjunctivae normal.  Neck:     Comments: Reduced ROM neck Cardiovascular:     Rate and Rhythm: Normal rate.  Pulmonary:     Effort: Pulmonary effort is normal.  Musculoskeletal:     Cervical back: Tenderness present.  Skin:    General: Skin is warm and dry.     Findings: No erythema or rash.  Neurological:     Mental Status: He is alert and oriented to person, place, and time.  Psychiatric:        Mood and Affect: Mood normal.        Behavior: Behavior normal.        Thought Content: Thought content normal.     Comments: Well groomed, good eye contact, normal speech and thoughts      Results for orders placed or performed during the hospital encounter of 05/08/23  Comprehensive metabolic panel  Result Value Ref Range   Sodium 132 (L) 135 - 145 mmol/L   Potassium 3.5 3.5 - 5.1 mmol/L   Chloride 101 98 - 111 mmol/L   CO2 24 22 - 32 mmol/L   Glucose, Bld 92 70 - 99 mg/dL   BUN 18 8 - 23 mg/dL   Creatinine, Ser 4.40 0.61 - 1.24 mg/dL   Calcium 8.8 (L) 8.9 - 10.3 mg/dL   Total Protein 6.1 (L) 6.5 - 8.1 g/dL   Albumin 3.7 3.5 - 5.0 g/dL   AST 17 15 - 41 U/L   ALT 16 0 - 44 U/L   Alkaline Phosphatase 36 (L) 38 - 126 U/L   Total Bilirubin 0.7 0.3 - 1.2 mg/dL   GFR, Estimated >10 >27 mL/min   Anion gap 7 5 - 15  CBC with Differential  Result Value Ref Range   WBC 8.5 4.0 - 10.5 K/uL   RBC 6.22 (H) 4.22 - 5.81 MIL/uL   Hemoglobin 13.5 13.0 - 17.0 g/dL   HCT 25.3 66.4 - 40.3 %   MCV 68.0 (L) 80.0 - 100.0 fL   MCH 21.7 (L) 26.0 - 34.0 pg   MCHC 31.9 30.0 - 36.0 g/dL   RDW 47.4 (H)  11.5 - 15.5 %   Platelets 234 150 - 400 K/uL   nRBC 0.0 0.0 - 0.2 %   Neutrophils Relative %  65 %   Neutro Abs 5.5 1.7 - 7.7 K/uL   Lymphocytes Relative 24 %   Lymphs Abs 2.0 0.7 - 4.0 K/uL   Monocytes Relative 8 %   Monocytes Absolute 0.7 0.1 - 1.0 K/uL   Eosinophils Relative 1 %   Eosinophils Absolute 0.1 0.0 - 0.5 K/uL   Basophils Relative 1 %   Basophils Absolute 0.1 0.0 - 0.1 K/uL   Immature Granulocytes 1 %   Abs Immature Granulocytes 0.12 (H) 0.00 - 0.07 K/uL  TSH  Result Value Ref Range   TSH 2.394 0.350 - 4.500 uIU/mL  T4, free  Result Value Ref Range   Free T4 0.92 0.61 - 1.12 ng/dL      Assessment & Plan:   Problem List Items Addressed This Visit   None Visit Diagnoses     Chronic neck pain    -  Primary   Relevant Medications   cyclobenzaprine (FLEXERIL) 10 MG tablet   Need for influenza vaccination       Relevant Orders   Flu Vaccine Trivalent High Dose (Fluad)   Neck muscle spasm       Relevant Medications   cyclobenzaprine (FLEXERIL) 10 MG tablet        Assessment and Plan    Neck Pain Chronic issue exacerbated recently. Currently managed with Flexeril, taken inconsistently due to variable schedule. Discussed the benefits of heat therapy. -Increase Flexeril to twice daily, 90-day supply to [facility]. -Consider use of a heating pad for additional relief.  Nausea New onset, likely secondary to ongoing chemotherapy and radiation treatment. Currently managed with Compazine. -Continue Compazine as needed for nausea.  Influenza Vaccination Discussed the importance of receiving the annual flu shot. -Administer flu shot today.       Meds ordered this encounter  Medications   cyclobenzaprine (FLEXERIL) 10 MG tablet    Sig: Take 1 tablet (10 mg total) by mouth 2 (two) times daily.    Dispense:  180 tablet    Refill:  1      Follow up plan: Return if symptoms worsen or fail to improve.  [name], DO [facility] 06/15/2023, 3:09 PM

## 2023-06-15 NOTE — Patient Instructions (Addendum)
Thank you for coming to the office today.  Flexeril muscle relaxant twice a day , 90 day supply to [facility]  Flu Shot today  Compazaine for nausea.  Please schedule a Follow-up Appointment to: Return if symptoms worsen or fail to improve.  If you have any other questions or concerns, please feel free to call the office or send a message through MyChart. You may also schedule an earlier appointment if necessary.  Additionally, you may be receiving a survey about your experience at our office within a few days to 1 week by e-mail or mail. We value your feedback.  [name], DO [facility], [place]

## 2023-06-15 NOTE — Addendum Note (Signed)
Addended by: Smitty Cords on: 06/15/2023 11:49 PM   Modules accepted: Level of Service

## 2023-06-16 DIAGNOSIS — C689 Malignant neoplasm of urinary organ, unspecified: Secondary | ICD-10-CM | POA: Diagnosis not present

## 2023-06-16 DIAGNOSIS — Z5111 Encounter for antineoplastic chemotherapy: Secondary | ICD-10-CM | POA: Diagnosis not present

## 2023-06-16 DIAGNOSIS — Z510 Encounter for antineoplastic radiation therapy: Secondary | ICD-10-CM | POA: Diagnosis not present

## 2023-06-16 DIAGNOSIS — C679 Malignant neoplasm of bladder, unspecified: Secondary | ICD-10-CM | POA: Diagnosis not present

## 2023-06-16 NOTE — Addendum Note (Signed)
Addended by: Smitty Cords on: 06/16/2023 01:10 AM   Modules accepted: Level of Service

## 2023-06-17 DIAGNOSIS — Z510 Encounter for antineoplastic radiation therapy: Secondary | ICD-10-CM | POA: Diagnosis not present

## 2023-06-17 DIAGNOSIS — C679 Malignant neoplasm of bladder, unspecified: Secondary | ICD-10-CM | POA: Diagnosis not present

## 2023-06-20 DIAGNOSIS — R300 Dysuria: Secondary | ICD-10-CM | POA: Diagnosis not present

## 2023-06-20 DIAGNOSIS — Z79899 Other long term (current) drug therapy: Secondary | ICD-10-CM | POA: Diagnosis not present

## 2023-06-20 DIAGNOSIS — C679 Malignant neoplasm of bladder, unspecified: Secondary | ICD-10-CM | POA: Diagnosis not present

## 2023-06-20 DIAGNOSIS — Z510 Encounter for antineoplastic radiation therapy: Secondary | ICD-10-CM | POA: Diagnosis not present

## 2023-06-20 DIAGNOSIS — Z87891 Personal history of nicotine dependence: Secondary | ICD-10-CM | POA: Diagnosis not present

## 2023-06-20 DIAGNOSIS — C689 Malignant neoplasm of urinary organ, unspecified: Secondary | ICD-10-CM | POA: Diagnosis not present

## 2023-06-20 DIAGNOSIS — I10 Essential (primary) hypertension: Secondary | ICD-10-CM | POA: Diagnosis not present

## 2023-06-20 DIAGNOSIS — C7982 Secondary malignant neoplasm of genital organs: Secondary | ICD-10-CM | POA: Diagnosis not present

## 2023-06-20 DIAGNOSIS — R350 Frequency of micturition: Secondary | ICD-10-CM | POA: Diagnosis not present

## 2023-06-20 DIAGNOSIS — Z888 Allergy status to other drugs, medicaments and biological substances status: Secondary | ICD-10-CM | POA: Diagnosis not present

## 2023-06-20 DIAGNOSIS — Z5111 Encounter for antineoplastic chemotherapy: Secondary | ICD-10-CM | POA: Diagnosis not present

## 2023-06-20 DIAGNOSIS — Z9151 Personal history of suicidal behavior: Secondary | ICD-10-CM | POA: Diagnosis not present

## 2023-06-20 DIAGNOSIS — Z792 Long term (current) use of antibiotics: Secondary | ICD-10-CM | POA: Diagnosis not present

## 2023-06-20 DIAGNOSIS — E039 Hypothyroidism, unspecified: Secondary | ICD-10-CM | POA: Diagnosis not present

## 2023-06-21 DIAGNOSIS — C689 Malignant neoplasm of urinary organ, unspecified: Secondary | ICD-10-CM | POA: Diagnosis not present

## 2023-06-21 DIAGNOSIS — Z510 Encounter for antineoplastic radiation therapy: Secondary | ICD-10-CM | POA: Diagnosis not present

## 2023-06-22 DIAGNOSIS — Z510 Encounter for antineoplastic radiation therapy: Secondary | ICD-10-CM | POA: Diagnosis not present

## 2023-06-22 DIAGNOSIS — C689 Malignant neoplasm of urinary organ, unspecified: Secondary | ICD-10-CM | POA: Diagnosis not present

## 2023-06-23 DIAGNOSIS — C689 Malignant neoplasm of urinary organ, unspecified: Secondary | ICD-10-CM | POA: Diagnosis not present

## 2023-06-23 DIAGNOSIS — Z510 Encounter for antineoplastic radiation therapy: Secondary | ICD-10-CM | POA: Diagnosis not present

## 2023-06-23 DIAGNOSIS — Z5111 Encounter for antineoplastic chemotherapy: Secondary | ICD-10-CM | POA: Diagnosis not present

## 2023-06-24 DIAGNOSIS — C679 Malignant neoplasm of bladder, unspecified: Secondary | ICD-10-CM | POA: Diagnosis not present

## 2023-06-24 DIAGNOSIS — Z510 Encounter for antineoplastic radiation therapy: Secondary | ICD-10-CM | POA: Diagnosis not present

## 2023-06-27 DIAGNOSIS — Z510 Encounter for antineoplastic radiation therapy: Secondary | ICD-10-CM | POA: Diagnosis not present

## 2023-06-27 DIAGNOSIS — Z5111 Encounter for antineoplastic chemotherapy: Secondary | ICD-10-CM | POA: Diagnosis not present

## 2023-06-27 DIAGNOSIS — C679 Malignant neoplasm of bladder, unspecified: Secondary | ICD-10-CM | POA: Diagnosis not present

## 2023-06-27 DIAGNOSIS — C689 Malignant neoplasm of urinary organ, unspecified: Secondary | ICD-10-CM | POA: Diagnosis not present

## 2023-06-28 DIAGNOSIS — C679 Malignant neoplasm of bladder, unspecified: Secondary | ICD-10-CM | POA: Diagnosis not present

## 2023-06-28 DIAGNOSIS — Z510 Encounter for antineoplastic radiation therapy: Secondary | ICD-10-CM | POA: Diagnosis not present

## 2023-06-29 ENCOUNTER — Other Ambulatory Visit: Payer: Self-pay | Admitting: Family Medicine

## 2023-06-29 DIAGNOSIS — G8929 Other chronic pain: Secondary | ICD-10-CM

## 2023-06-29 DIAGNOSIS — Z510 Encounter for antineoplastic radiation therapy: Secondary | ICD-10-CM | POA: Diagnosis not present

## 2023-06-29 DIAGNOSIS — C689 Malignant neoplasm of urinary organ, unspecified: Secondary | ICD-10-CM | POA: Diagnosis not present

## 2023-06-29 DIAGNOSIS — M62838 Other muscle spasm: Secondary | ICD-10-CM

## 2023-06-29 MED ORDER — CYCLOBENZAPRINE HCL 10 MG PO TABS
10.0000 mg | ORAL_TABLET | Freq: Two times a day (BID) | ORAL | 0 refills | Status: DC
Start: 2023-06-29 — End: 2023-07-06

## 2023-06-29 NOTE — Telephone Encounter (Signed)
Patient request short supply until mail order comes.

## 2023-06-29 NOTE — Telephone Encounter (Signed)
Requested medication (s) are due for refill today: patient requesting short supply of 10 tab to last until mail order arrives.  Requested medication (s) are on the active medication list: yes  Last refill:  06/15/23  Future visit scheduled: no  Notes to clinic:  patient requesting short supply of 10 tab to last until mail order arrives.     Requested Prescriptions  Pending Prescriptions Disp Refills   cyclobenzaprine (FLEXERIL) 10 MG tablet 180 tablet 1    Sig: Take 1 tablet (10 mg total) by mouth 2 (two) times daily.     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

## 2023-06-29 NOTE — Telephone Encounter (Signed)
Requested medication (s) are due for refill today: no  Requested medication (s) are on the active medication list: no    Last refill: 06/15/23  #180  1 refill  Future visit scheduled no  Notes to clinic: Attempted to reach pt for clarification.  SHould have supply of meds . Left VM to call back.    (Preferred Pharmacy (with phone number or street name):  [facility]) [place], [place] - [address] [facility]  [address], [place] [address]  Phone:  [phone]  Fax:  [phone]    Patient has 2 pills (1day) left . Patient would like 10 pills sent to the local pharmacy until the home delivery arrives  [facility] [address] ([address]), [place] - [address] [place] [place] ([address]) [place] [id number]: [phone] Fax: [phone]: Not open 24 hours       Requested Prescriptions  Pending Prescriptions Disp Refills   cyclobenzaprine (FLEXERIL) 10 MG tablet 180 tablet 1    Sig: Take 1 tablet (10 mg total) by mouth 2 (two) times daily.     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)   [place] [facility] Smitty Cords, DO   6 months ago Testicular swelling, right   [place] [facility] [place], Salvadore Oxford, NP       Future Appointments             In 2 months Richardo Hanks, Laurette Schimke, MD [facility]  Urology [facility]

## 2023-06-29 NOTE — Telephone Encounter (Signed)
Medication Refill - Medication: cyclobenzaprine (FLEXERIL) 10 MG tablet [[id number]]   (patient has three days left)     Has the patient contacted their pharmacy? Yes.     (Preferred Pharmacy (with phone number or street name):  [facility]) [place], [place] - [address] [facility]  [address] [place] [address], [place] [address]  Phone:  [phone]  Fax:  [phone]   Patient has 2 pills (1day) left . Patient would like 10 pills sent to the local pharmacy until the home delivery arrives  [facility] [address] ([address]), [place] - [address] [place] ([place]) [place] [address]KKXFG: [phone] Fax: [phone]: Not open 24 hours       Has the patient been seen for an appointment in the last year OR does the patient have an upcoming appointment? Yes.    Agent: Please be advised that RX refills may take up to 3 business days. We ask that you follow-up with your pharmacy.

## 2023-06-30 DIAGNOSIS — C679 Malignant neoplasm of bladder, unspecified: Secondary | ICD-10-CM | POA: Diagnosis not present

## 2023-06-30 DIAGNOSIS — C689 Malignant neoplasm of urinary organ, unspecified: Secondary | ICD-10-CM | POA: Diagnosis not present

## 2023-06-30 DIAGNOSIS — Z510 Encounter for antineoplastic radiation therapy: Secondary | ICD-10-CM | POA: Diagnosis not present

## 2023-07-01 DIAGNOSIS — C689 Malignant neoplasm of urinary organ, unspecified: Secondary | ICD-10-CM | POA: Diagnosis not present

## 2023-07-01 DIAGNOSIS — Z510 Encounter for antineoplastic radiation therapy: Secondary | ICD-10-CM | POA: Diagnosis not present

## 2023-07-04 DIAGNOSIS — C689 Malignant neoplasm of urinary organ, unspecified: Secondary | ICD-10-CM | POA: Diagnosis not present

## 2023-07-04 DIAGNOSIS — Z5111 Encounter for antineoplastic chemotherapy: Secondary | ICD-10-CM | POA: Diagnosis not present

## 2023-07-04 DIAGNOSIS — Z87891 Personal history of nicotine dependence: Secondary | ICD-10-CM | POA: Diagnosis not present

## 2023-07-04 DIAGNOSIS — E039 Hypothyroidism, unspecified: Secondary | ICD-10-CM | POA: Diagnosis not present

## 2023-07-04 DIAGNOSIS — D509 Iron deficiency anemia, unspecified: Secondary | ICD-10-CM | POA: Diagnosis not present

## 2023-07-04 DIAGNOSIS — Z9151 Personal history of suicidal behavior: Secondary | ICD-10-CM | POA: Diagnosis not present

## 2023-07-04 DIAGNOSIS — C679 Malignant neoplasm of bladder, unspecified: Secondary | ICD-10-CM | POA: Diagnosis not present

## 2023-07-04 DIAGNOSIS — Z510 Encounter for antineoplastic radiation therapy: Secondary | ICD-10-CM | POA: Diagnosis not present

## 2023-07-04 DIAGNOSIS — Z888 Allergy status to other drugs, medicaments and biological substances status: Secondary | ICD-10-CM | POA: Diagnosis not present

## 2023-07-05 DIAGNOSIS — Z510 Encounter for antineoplastic radiation therapy: Secondary | ICD-10-CM | POA: Diagnosis not present

## 2023-07-05 DIAGNOSIS — C679 Malignant neoplasm of bladder, unspecified: Secondary | ICD-10-CM | POA: Diagnosis not present

## 2023-07-06 ENCOUNTER — Ambulatory Visit: Payer: Self-pay

## 2023-07-06 ENCOUNTER — Other Ambulatory Visit: Payer: Self-pay | Admitting: Family Medicine

## 2023-07-06 DIAGNOSIS — M62838 Other muscle spasm: Secondary | ICD-10-CM

## 2023-07-06 DIAGNOSIS — G8929 Other chronic pain: Secondary | ICD-10-CM

## 2023-07-06 DIAGNOSIS — Z510 Encounter for antineoplastic radiation therapy: Secondary | ICD-10-CM | POA: Diagnosis not present

## 2023-07-06 DIAGNOSIS — C679 Malignant neoplasm of bladder, unspecified: Secondary | ICD-10-CM | POA: Diagnosis not present

## 2023-07-06 NOTE — Telephone Encounter (Signed)
Medication Refill - Medication: cyclobenzaprine (FLEXERIL) 10 MG tablet [[id number]]  Has the patient contacted their pharmacy? Yes.      Preferred Pharmacy (with phone number or street name):  [facility] [address] ([address]), [place] - [address]  [address] [place]) [place] [address]  Phone: [phone] Fax: [phone]  Hours: Not open 24 hours        Has the patient been seen for an appointment in the last year OR does the patient have an upcoming appointment? Yes.    Agent: Please be advised that RX refills may take up to 3 business days. We ask that you follow-up with your pharmacy.

## 2023-07-06 NOTE — Telephone Encounter (Signed)
Message from Phill Myron sent at 07/06/2023  4:41 PM EDT  Summary: Medication   Mr. Zanghi called upset, asking why his medication was not sent through [facility].  Then he requested it to be sent through [facility] because he is about to run out. ( I sent a TE) but he also wants a script to also [facility] so that he does not run out of meds because [facility]. Please advise...  He also asked if it can not go through [facility] please call him and tell him why..         Called pt and LM on VM to call back.

## 2023-07-06 NOTE — Telephone Encounter (Signed)
Requested medication (s) are due for refill today: Yes  Requested medication (s) are on the active medication list: Yes  Last refill:  06/29/23  Future visit scheduled: No  Notes to clinic:  Unable to refill per protocol, cannot delegate. Patient requesting 10 day supply until mail order arrives from [facility], shipping out today or tomorrow, see notes.     Requested Prescriptions  Pending Prescriptions Disp Refills   cyclobenzaprine (FLEXERIL) 10 MG tablet 10 tablet 0    Sig: Take 1 tablet (10 mg total) by mouth 2 (two) times daily.     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

## 2023-07-06 NOTE — Telephone Encounter (Addendum)
[facility] called and spoke to [place], [facility] regarding cyclobenzaprine refill. She says it's in process of being filled #180 tabs and will ship out either today or tomorrow to the patient. Patient called, left VM to return the call to the office. Unable to reach patient after 3 attempts by [facility] NT, routing to the provider for resolution per protocol.   Summary: Medication   Joe Hardy called upset, asking why his medication was not sent through [facility].  Then he requested it to be sent through [facility] because he is about to run out. ( I sent a TE) but he also wants a script to also go to [facility] so that he does not run out of meds because [facility]. Please advise...  He also asked if it can not go through [employer] please call him and tell him why.Marland Kitchen

## 2023-07-06 NOTE — Telephone Encounter (Signed)
[facility] called in a separate triage encounter and spoke to [facility], [facility] who says cyclobenzaprine 10 mg BID #180 will be shipped out today or tomorrow to the patient.

## 2023-07-07 ENCOUNTER — Other Ambulatory Visit: Payer: Self-pay | Admitting: Family Medicine

## 2023-07-07 DIAGNOSIS — C689 Malignant neoplasm of urinary organ, unspecified: Secondary | ICD-10-CM | POA: Diagnosis not present

## 2023-07-07 DIAGNOSIS — G8929 Other chronic pain: Secondary | ICD-10-CM

## 2023-07-07 DIAGNOSIS — Z510 Encounter for antineoplastic radiation therapy: Secondary | ICD-10-CM | POA: Diagnosis not present

## 2023-07-07 DIAGNOSIS — M62838 Other muscle spasm: Secondary | ICD-10-CM

## 2023-07-07 DIAGNOSIS — Z5111 Encounter for antineoplastic chemotherapy: Secondary | ICD-10-CM | POA: Diagnosis not present

## 2023-07-07 MED ORDER — CYCLOBENZAPRINE HCL 10 MG PO TABS
10.0000 mg | ORAL_TABLET | Freq: Two times a day (BID) | ORAL | 0 refills | Status: DC
Start: 2023-07-07 — End: 2023-11-28

## 2023-07-07 NOTE — Telephone Encounter (Signed)
Medication Refill - Medication:  cyclobenzaprine (FLEXERIL) 10 MG tablet  System shows med is at pharmacy but he says he was told they dont have it  Has the patient contacted their pharmacy? yes (Agent: If yes, when and what did the pharmacy advise?)contact pcp  Preferred Pharmacy (with phone number or street name):  [facility] [address] [place]), [place] - [address] Phone: [phone]  Fax: [phone]     Has the patient been seen for an appointment in the last year OR does the patient have an upcoming appointment? yes  Agent: Please be advised that RX refills may take up to 3 business days. We ask that you follow-up with your pharmacy.

## 2023-07-08 DIAGNOSIS — Z510 Encounter for antineoplastic radiation therapy: Secondary | ICD-10-CM | POA: Diagnosis not present

## 2023-07-08 DIAGNOSIS — C679 Malignant neoplasm of bladder, unspecified: Secondary | ICD-10-CM | POA: Diagnosis not present

## 2023-07-08 NOTE — Telephone Encounter (Signed)
Requested medications are due for refill today.  See note - Rx was refilled yesterday.  Requested medications are on the active medications list.  yes  Last refill. See note  Future visit scheduled.   no  Notes to clinic.  [facility] unable to [facility] until [facility] 30th with insurance. Pt has order from mail order. Mail order refill is 06/15/2023 #180 1 rf. Called pt  - he states that he got an email yesterday that his medication has shipped. He states that it takes several days for Rx to arrive. Called pharmacy 10 tablets will cost $6.71 Pharmacy will prepare. Called pt back. He will pick up medication and pay for it out of pocket.    Requested Prescriptions  Pending Prescriptions Disp Refills   cyclobenzaprine (FLEXERIL) 10 MG tablet 20 tablet 0    Sig: Take 1 tablet (10 mg total) by mouth 2 (two) times daily.     Not Delegated - Analgesics:  Muscle Relaxants Failed - 07/07/2023  3:48 PM      Failed - This refill cannot be delegated      Passed - Valid encounter within last 6 months    Recent Outpatient Visits           3 weeks ago Chronic neck pain   [facility] [place], Netta Neat, DO   2 months ago Acute bilateral low back pain with bilateral sciatica   [facility] Smitty Cords, DO   3 months ago Urothelial carcinoma [facility])   [place] [facility] Smitty Cords, DO   5 months ago Urothelial carcinoma [facility])   [place] [facility] Smitty Cords, DO   7 months ago Testicular swelling, right   [facility], Salvadore Oxford, NP       Future Appointments             In 1 month Richardo Hanks, Laurette Schimke, MD [facility]

## 2023-07-08 NOTE — Telephone Encounter (Signed)
[facility] unable to refill until [facility]th. Pt has order from mail order. Mail order refill is 06/15/2023 #180 1 rf. Called pt  - he states that he got an email yesterday that his medication has shipped. He states that it takes several days for Rx to arrive. Called pharmacy 10 tablets will cost $6.71 Pharmacy will prepare. Called pt back. He will pick up medication and pay for it out of pocket.

## 2023-07-08 NOTE — Telephone Encounter (Signed)
Call needed to pharmacy to see if patient has picked up medication . Signed yesterday receipt confirmed by pharmacy 07/07/23 at 9:32 am.

## 2023-07-11 DIAGNOSIS — Z5111 Encounter for antineoplastic chemotherapy: Secondary | ICD-10-CM | POA: Diagnosis not present

## 2023-07-11 DIAGNOSIS — Z510 Encounter for antineoplastic radiation therapy: Secondary | ICD-10-CM | POA: Diagnosis not present

## 2023-07-11 DIAGNOSIS — C689 Malignant neoplasm of urinary organ, unspecified: Secondary | ICD-10-CM | POA: Diagnosis not present

## 2023-07-12 DIAGNOSIS — C679 Malignant neoplasm of bladder, unspecified: Secondary | ICD-10-CM | POA: Diagnosis not present

## 2023-07-12 DIAGNOSIS — Z510 Encounter for antineoplastic radiation therapy: Secondary | ICD-10-CM | POA: Diagnosis not present

## 2023-07-18 DIAGNOSIS — C679 Malignant neoplasm of bladder, unspecified: Secondary | ICD-10-CM | POA: Diagnosis not present

## 2023-07-18 DIAGNOSIS — C689 Malignant neoplasm of urinary organ, unspecified: Secondary | ICD-10-CM | POA: Diagnosis not present

## 2023-07-18 DIAGNOSIS — C7982 Secondary malignant neoplasm of genital organs: Secondary | ICD-10-CM | POA: Diagnosis not present

## 2023-07-18 DIAGNOSIS — R300 Dysuria: Secondary | ICD-10-CM | POA: Diagnosis not present

## 2023-07-18 DIAGNOSIS — R350 Frequency of micturition: Secondary | ICD-10-CM | POA: Diagnosis not present

## 2023-08-03 ENCOUNTER — Ambulatory Visit: Payer: Self-pay | Admitting: *Deleted

## 2023-08-03 ENCOUNTER — Telehealth: Payer: Self-pay | Admitting: Family Medicine

## 2023-08-03 NOTE — Telephone Encounter (Signed)
Spoke with patient.  He says he does not have transportation to his oncologist and has finished treatment.  He is going to go to urgent care.  Follow up appointment also scheduled per patient request.

## 2023-08-03 NOTE — Telephone Encounter (Signed)
Reason for Disposition  Numbness in a leg or foot (i.e., loss of sensation)  Answer Assessment - Initial Assessment Questions 1. ONSET: "When did the pain start?"      I'm having numbness in my left leg.   It's not a pain but a feeling.   Like something is not right.   Monday I took a shower and I didn't look at my feet but I noticed dried blood under my toes on my left foot.    I dropped something on that foot but it was a long time ago.  I just finished 7 weeks of chemo and radiation for my cancer.   My memory isn't so great anymore.  The next morning my foot was better.   I've been feeling this "numbness", it's not really numb   Walking makes it go away.   It's been a month ago since I dropped something on my foot.   I noticed the blood under my toes 2 days ago.    2. LOCATION: "Where is the pain located?"      My left leg.   I might have felt this before I started chemo and radiation.   It comes and goes.   It's not really a numb feeling..  It only lasts about 30 min. Or so.   Walking makes it go away.     I wasn't too concerned about it until I saw the blood under skin of toes 2 days ago. 3. PAIN: "How bad is the pain?"    (Scale 1-10; or mild, moderate, severe)   -  MILD (1-3): doesn't interfere with normal activities    -  MODERATE (4-7): interferes with normal activities (e.g., work or school) or awakens from sleep, limping    -  SEVERE (8-10): excruciating pain, unable to do any normal activities, unable to walk     It's not a pain and not really a numb feeling.   It's hard to describe.   4. WORK OR EXERCISE: "Has there been any recent work or exercise that involved this part of the body?"      I dropped something on this foot a while ago, maybe a month or so ago.   5. CAUSE: "What do you think is causing the leg pain?"     I'm not sure. 6. OTHER SYMPTOMS: "Do you have any other symptoms?" (e.g., chest pain, back pain, breathing difficulty, swelling, rash, fever, numbness, weakness)      See above 7. PREGNANCY: "Is there any chance you are pregnant?" "When was your last menstrual period?"     N/A  Protocols used: Leg Pain-A-AH

## 2023-08-03 NOTE — Telephone Encounter (Signed)
I reviewed the triage note.  I cannot work him in this week.   From the description, bruising under toenail is not a problem. That can be from any bump or injury to the toe or toenail. That may take months for the old bruise blood under nail to grow out and resolve. That does not require a doctors visit.  The numbness can easily be from Chemotherapy. Toxicity of chemotherapy can damage nerves especially in the lower extremities.  I would suggest the Oncology office can do a follow-up for symptom management if they can do acute visits for that. Otherwise I would suggest next available in early December is reasonable to discuss neuropathy symptoms.   Joe Pilar, DO [facility] 08/03/2023, 11:45 AM

## 2023-08-03 NOTE — Telephone Encounter (Signed)
  Chief Complaint: "numbness", not a pain in left foot.  Weakness in left leg.   Just completed 7 weeks of chemo and radiation. Symptoms: Concerned because has blood under the skin of his 2rd and 3rd toe on left foot.   Approximately a month ago he dropped something on his left foot but he doesn't recall having pain or an injury to it at the time.  "The chemo and all has messed up my memory". He noticed dried blood under his toes this morning and yesterday morning after taking a shower and was drying off.   Left leg feels weak at times like it's going to give out.   He hasn't felt well for the last 3-4 days either.   Felt fine after completing the chemo and radiation. Frequency: Intermittent feeling of "numbness" in left foot.   Walking makes this feeling go away.   Pertinent Negatives: Patient denies Remembering what he dropped on his left foot.   Denies redness or swelling in his calf, foot or leg.   He is able to ambulate fine.   Disposition: [] ED /[] Urgent Care (no appt availability in office) / [x] Appointment(In office/virtual)/ []  [place] [facility]/ [] Home Care/ [] Refused Recommended Disposition /[] [facility] [facility]/ []  Follow-up with PCP Additional Notes: No appts with Dr. Althea Charon until Dec. 2 or 3, 2024.   I spoke with Fleet Contras about the possibility of him being seen sooner.    She asked if I would send the my notes over and Dr. Althea Charon could decide if he is able to work pt in.      Pt was agreeable to this plan.    Pt. Has a lot of blocks on his phones so if you call him from the practice please call [phone] because the office number is not blocked.    If calling from a number other than the direct office number call his cell at [phone].

## 2023-08-03 NOTE — Telephone Encounter (Signed)
Left patient a message with Dr Cheron Schaumann response.

## 2023-08-03 NOTE — Telephone Encounter (Signed)
Pt is calling in returning a call from the office. Pt says to please call his home phone.

## 2023-08-05 ENCOUNTER — Other Ambulatory Visit: Payer: Self-pay | Admitting: Internal Medicine

## 2023-08-05 ENCOUNTER — Other Ambulatory Visit: Payer: Self-pay | Admitting: Family Medicine

## 2023-08-05 DIAGNOSIS — E7800 Pure hypercholesterolemia, unspecified: Secondary | ICD-10-CM

## 2023-08-05 DIAGNOSIS — E034 Atrophy of thyroid (acquired): Secondary | ICD-10-CM

## 2023-08-05 NOTE — Telephone Encounter (Signed)
Requested Prescriptions  Pending Prescriptions Disp Refills   atorvastatin (LIPITOR) 20 MG tablet [Pharmacy Med Name: Atorvastatin Calcium 20 MG Oral Tablet] 30 tablet 0    Sig: TAKE 1 TABLET BY MOUTH DAILY AT  6PM     Cardiovascular:  Antilipid - Statins Failed - 08/05/2023  4:33 AM      Failed - Lipid Panel in normal range within the last 12 months    Cholesterol, Total  Date Value Ref Range Status  04/20/2017 113 100 - 199 mg/dL Final   Cholesterol  Date Value Ref Range Status  09/23/2022 196 <200 mg/dL Final   LDL Cholesterol (Calc)  Date Value Ref Range Status  09/23/2022 128 (H) mg/dL (calc) Final    Comment:    Reference range: <100 . Desirable range <100 mg/dL for primary prevention;   <70 mg/dL for patients with CHD or diabetic patients  with > or = 2 CHD risk factors. Marland Kitchen LDL-C is now calculated using the Martin-Hopkins  calculation, which is a validated novel method providing  better accuracy than the Friedewald equation in the  estimation of LDL-C.  Horald Pollen et al. Lenox Ahr. 4098;119(14): 2061-2068  (http://education.Quest[employer].com/faq/FAQ164)    HDL  Date Value Ref Range Status  09/23/2022 53 > OR = 40 mg/dL Final  78/29/5621 26 (L) >39 mg/dL Final   Triglycerides  Date Value Ref Range Status  09/23/2022 56 <150 mg/dL Final         Passed - Patient is not pregnant      Passed - Valid encounter within last 12 months    Recent Outpatient Visits           1 month ago Chronic neck pain   [place] [facility], Netta Neat, DO   3 months ago Acute bilateral low back pain with bilateral sciatica   [place] [facility] Smitty Cords, DO   4 months ago Urothelial carcinoma [facility])   [place] [facility] Smitty Cords, DO   6 months ago Urothelial carcinoma [facility])   [place] [facility] Smitty Cords, DO   8 months ago Testicular  swelling, right   [place] [facility] [place], Salvadore Oxford, NP       Future Appointments             In 3 weeks Althea Charon, Netta Neat, DO [place] [facility]   In 3 weeks Richardo Hanks, Laurette Schimke, MD [facility] [place]

## 2023-08-05 NOTE — Telephone Encounter (Signed)
Refill requested too soon Requested Prescriptions  Pending Prescriptions Disp Refills   lisinopril-hydrochlorothiazide (ZESTORETIC) 10-12.5 MG tablet [Pharmacy Med Name: Lisinopril-hydroCHLOROthiazide 10-12.5 MG Oral Tablet] 100 tablet 2    Sig: TAKE 1 TABLET BY MOUTH DAILY     Cardiovascular:  ACEI + Diuretic Combos Failed - 08/05/2023  4:33 AM      Failed - Na in normal range and within 180 days    Sodium  Date Value Ref Range Status  05/08/2023 132 (L) 135 - 145 mmol/L Final  04/20/2017 140 134 - 144 mmol/L Final         Passed - K in normal range and within 180 days    Potassium  Date Value Ref Range Status  05/08/2023 3.5 3.5 - 5.1 mmol/L Final         Passed - Cr in normal range and within 180 days    Creat  Date Value Ref Range Status  09/23/2022 1.04 0.70 - 1.22 mg/dL Final   Creatinine, Ser  Date Value Ref Range Status  05/08/2023 0.78 0.61 - 1.24 mg/dL Final         Passed - eGFR is 30 or above and within 180 days    GFR, Est African American  Date Value Ref Range Status  03/09/2021 98 > OR = 60 mL/min/1.13m2 Final   GFR, Est Non African American  Date Value Ref Range Status  03/09/2021 85 > OR = 60 mL/min/1.83m2 Final   GFR, Estimated  Date Value Ref Range Status  05/08/2023 >60 >60 mL/min Final    Comment:    (NOTE) Calculated using the CKD-EPI Creatinine Equation (2021)    eGFR  Date Value Ref Range Status  09/23/2022 72 > OR = 60 mL/min/1.75m2 Final         Passed - Patient is not pregnant      Passed - Last BP in normal range    BP Readings from Last 1 Encounters:  06/15/23 134/82         Passed - Valid encounter within last 6 months    Recent Outpatient Visits           1 month ago Chronic neck pain   [place] [facility] [place], Netta Neat, DO   3 months ago Acute bilateral low back pain with bilateral sciatica   [place] [facility] Smitty Cords, DO   4 months ago Urothelial  carcinoma [facility])   [place] [facility] Smitty Cords, DO   6 months ago Urothelial carcinoma [facility])   [place] [facility] Smitty Cords, DO   8 months ago Testicular swelling, right   [place] [facility], Salvadore Oxford, NP       Future Appointments             In 3 weeks Althea Charon, Netta Neat, DO [place] [facility], [facility]   In 3 weeks Richardo Hanks, Laurette Schimke, MD [facility] [place]             levothyroxine (SYNTHROID) 75 MCG tablet [Pharmacy Med Name: Levothyroxine Sodium 75 MCG Oral Tablet] 100 tablet 2    Sig: TAKE 1 TABLET BY MOUTH DAILY  BEFORE BREAKFAST     Endocrinology:  Hypothyroid Agents Passed - 08/05/2023  4:33 AM      Passed - TSH in normal range and within 360 days    TSH  Date Value Ref  Range Status  05/08/2023 2.394 0.350 - 4.500 uIU/mL Final    Comment:    Performed by a 3rd Generation assay with a functional sensitivity of <=0.01 uIU/mL. 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 [place]

## 2023-08-25 DIAGNOSIS — C688 Malignant neoplasm of overlapping sites of urinary organs: Secondary | ICD-10-CM | POA: Diagnosis not present

## 2023-08-25 DIAGNOSIS — R918 Other nonspecific abnormal finding of lung field: Secondary | ICD-10-CM | POA: Diagnosis not present

## 2023-08-25 DIAGNOSIS — C679 Malignant neoplasm of bladder, unspecified: Secondary | ICD-10-CM | POA: Diagnosis not present

## 2023-08-29 ENCOUNTER — Ambulatory Visit: Payer: 59 | Admitting: Family Medicine

## 2023-08-29 ENCOUNTER — Other Ambulatory Visit: Payer: Self-pay

## 2023-08-29 DIAGNOSIS — E034 Atrophy of thyroid (acquired): Secondary | ICD-10-CM

## 2023-08-29 MED ORDER — LISINOPRIL-HYDROCHLOROTHIAZIDE 10-12.5 MG PO TABS: 1.0000 | ORAL_TABLET | Freq: Every day | ORAL | 0 refills | Status: DC

## 2023-08-29 MED ORDER — LEVOTHYROXINE SODIUM 75 MCG PO TABS: 75.0000 ug | ORAL_TABLET | Freq: Every day | ORAL | 0 refills | Status: DC

## 2023-08-30 ENCOUNTER — Other Ambulatory Visit: Payer: Self-pay

## 2023-08-31 ENCOUNTER — Telehealth: Payer: Self-pay | Admitting: Family Medicine

## 2023-08-31 ENCOUNTER — Other Ambulatory Visit: Payer: Self-pay | Admitting: Family Medicine

## 2023-08-31 DIAGNOSIS — C689 Malignant neoplasm of urinary organ, unspecified: Secondary | ICD-10-CM | POA: Diagnosis not present

## 2023-08-31 DIAGNOSIS — Z8551 Personal history of malignant neoplasm of bladder: Secondary | ICD-10-CM | POA: Diagnosis not present

## 2023-08-31 DIAGNOSIS — M62838 Other muscle spasm: Secondary | ICD-10-CM

## 2023-08-31 DIAGNOSIS — C679 Malignant neoplasm of bladder, unspecified: Secondary | ICD-10-CM | POA: Diagnosis not present

## 2023-08-31 MED ORDER — BACLOFEN 10 MG PO TABS: 5.0000 mg | ORAL_TABLET | Freq: Two times a day (BID) | ORAL | 1 refills | Status: DC | PRN

## 2023-08-31 NOTE — Telephone Encounter (Signed)
Called LVM 08/31/2023 to schedule Annual Wellness Visit. Please schedule office or virtual visits.  Joe Hardy; Care Guide Ambulatory Clinical Support [place] [facility] Direct Dial: [phone]

## 2023-09-01 ENCOUNTER — Other Ambulatory Visit: Payer: Self-pay

## 2023-09-01 ENCOUNTER — Ambulatory Visit: Payer: 59 | Admitting: Urology

## 2023-09-01 ENCOUNTER — Telehealth: Payer: Self-pay

## 2023-09-01 NOTE — Telephone Encounter (Signed)
Copied from CRM [id number]. Topic: Appointment Scheduling - Scheduling Inquiry for Clinic >> Sep 01, 2023 11:49 AM Marlow Baars wrote: Reason for CRM: The patient called in stating he has an appt with his provider next week for a follow up. He wants to report he is bladder cancer free. He has some issues concerning skin spots and arthritis in his neck. He would like to discuss a referral to [facility] and discuss medication for his arthritis in his neck as it is very discomforting. He previously used Flexeril until it didn't help anymore. Please assist patient further

## 2023-09-08 ENCOUNTER — Ambulatory Visit: Payer: 59 | Admitting: Family Medicine

## 2023-09-08 ENCOUNTER — Encounter: Payer: Self-pay | Admitting: Family Medicine

## 2023-09-08 VITALS — BP 132/80 | HR 92 | Ht 69.0 in | Wt 191.0 lb

## 2023-09-08 DIAGNOSIS — Z1283 Encounter for screening for malignant neoplasm of skin: Secondary | ICD-10-CM

## 2023-09-08 DIAGNOSIS — M542 Cervicalgia: Secondary | ICD-10-CM | POA: Diagnosis not present

## 2023-09-08 DIAGNOSIS — M62838 Other muscle spasm: Secondary | ICD-10-CM

## 2023-09-08 DIAGNOSIS — C689 Malignant neoplasm of urinary organ, unspecified: Secondary | ICD-10-CM | POA: Diagnosis not present

## 2023-09-08 DIAGNOSIS — G8929 Other chronic pain: Secondary | ICD-10-CM | POA: Diagnosis not present

## 2023-09-08 DIAGNOSIS — M5030 Other cervical disc degeneration, unspecified cervical region: Secondary | ICD-10-CM

## 2023-09-08 DIAGNOSIS — L821 Other seborrheic keratosis: Secondary | ICD-10-CM
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
## 2023-09-08 NOTE — Patient Instructions (Addendum)
Thank you for coming to the office today.  Keep on Baclofen as needed for neck, no new concerns.  Keep up with Urologist for surveillance for bladder cancer.  I have submitted a referral to Annual Medicare Wellness, they should call to schedule.  Referral to Dermatology  [facility] [place]ermatologist in [place], [place] Address: [address] [place], [place] [address] Phone: [phone]   Please schedule a Follow-up Appointment to: Return if symptoms worsen or fail to improve.  If you have any other questions or concerns, please feel free to call the office or send a message through MyChart. You may also schedule an earlier appointment if necessary.  Additionally, you may be receiving a survey about your experience at our office within a few days to 1 week by e-mail or mail. We value your feedback.  [name], DO [facility], [place]

## 2023-09-09 ENCOUNTER — Other Ambulatory Visit: Payer: Self-pay

## 2023-09-15 ENCOUNTER — Other Ambulatory Visit: Payer: Self-pay | Admitting: Internal Medicine

## 2023-09-15 DIAGNOSIS — E7800 Pure hypercholesterolemia, unspecified: Secondary | ICD-10-CM

## 2023-09-19 NOTE — Telephone Encounter (Signed)
 Requested medication (s) are due for refill today:   Yes  Requested medication (s) are on the active medication list:   Yes  Future visit scheduled:   No   Last ordered: 08/05/2023 #30, 0 refills  Unable to refill because lipid panel due     Requested Prescriptions  Pending Prescriptions Disp Refills   atorvastatin  (LIPITOR) 20 MG tablet [Pharmacy Med Name: Atorvastatin  Calcium  20 MG Oral Tablet] 30 tablet 11    Sig: TAKE 1 TABLET BY MOUTH DAILY AT  6PM     Cardiovascular:  Antilipid - Statins Failed - 09/19/2023 10:11 AM      Failed - Lipid Panel in normal range within the last 12 months    Cholesterol, Total  Date Value Ref Range Status  04/20/2017 113 100 - 199 mg/dL Final   Cholesterol  Date Value Ref Range Status  09/23/2022 196 <200 mg/dL Final   LDL Cholesterol (Calc)  Date Value Ref Range Status  09/23/2022 128 (H) mg/dL (calc) Final    Comment:    Reference range: <100 . Desirable range <100 mg/dL for primary prevention;   <70 mg/dL for patients with CHD or diabetic patients  with > or = 2 CHD risk factors. SABRA LDL-C is now calculated using the Martin-Hopkins  calculation, which is a validated novel method providing  better accuracy than the Friedewald equation in the  estimation of LDL-C.  Gladis APPLETHWAITE et al. SANDREA. 7986;689(80): 2061-2068  (http://education.[employer].com/faq/FAQ164)    HDL  Date Value Ref Range Status  09/23/2022 53 > OR = 40 mg/dL Final  91/91/7981 26 (L) >39 mg/dL Final   Triglycerides  Date Value Ref Range Status  09/23/2022 56 <150 mg/dL Final         Passed - Patient is not pregnant      Passed - Valid encounter within last 12 months    Recent Outpatient Visits           1 week ago Chronic neck pain   [facility] [place], Marsa PARAS, DO   3 months ago Chronic neck pain   [place] [facility] [place], Marsa PARAS, DO   4 months ago Acute bilateral low back pain  with bilateral sciatica   [place] [facility] [place] Marsa PARAS, DO   5 months ago Urothelial carcinoma [facility])   [place] [facility] [place] Marsa PARAS, DO   8 months ago Urothelial carcinoma [facility])   [place] [facility] [place] Marsa PARAS, DO       Future Appointments             In 2 days Francisca, Redell BROCKS, MD [facility] [place]   In 5 months Ed[place], Marsa PARAS, DO [facility]

## 2023-09-21 ENCOUNTER — Ambulatory Visit: Payer: 59 | Admitting: Urology

## 2023-09-21 VITALS — BP 162/94 | HR 86

## 2023-09-21 DIAGNOSIS — N401 Enlarged prostate with lower urinary tract symptoms: Secondary | ICD-10-CM | POA: Diagnosis not present

## 2023-09-21 DIAGNOSIS — C689 Malignant neoplasm of urinary organ, unspecified: Secondary | ICD-10-CM | POA: Diagnosis not present

## 2023-09-21 DIAGNOSIS — N138 Other obstructive and reflux uropathy: Secondary | ICD-10-CM

## 2023-09-21 LAB — BLADDER SCAN AMB NON-IMAGING

## 2023-09-21 NOTE — Patient Instructions (Signed)

## 2023-09-21 NOTE — Progress Notes (Signed)
   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  He has an extensive psych history, lives alone with poor social support, and is quite frail.  He originally presented with Foley dependent urinary retention in February 2024 and failed a voiding trial despite Flomax.  He underwent a cystoscopy and CT for further evaluation which showed a normal-appearing bladder, enlarged prostate measuring 96 g on CT, and a solitary pathologically enlarged lymph node in the anterior left pelvis.  PSA was normal at 1.99, and he was referred to oncology for further evaluation of the solitary node.  In terms of his urinary symptoms, he opted for a HOLEP to resume spontaneous voiding.  He underwent a HOLEP and cystolitholapaxy on 11/26/2022.  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  He also had multiple ER visits over the next week for unclear reasons and problems with the catheter.  He has been voiding spontaneously since Foley was removed and from a urinary perspective is doing well.  He denies any incontinence, urinating with a strong stream.  No recurrent gross hematuria.  He was referred to oncology as well as radiation oncology for consideration of Tri modal therapy in the setting of his frailty and other comorbidities.  He ultimately sought care at [facility].  He had a PET CT scan in June 2024 that showed the enlarged lymph nodes were not FDG avid and favored reactive lymph nodes, and showed no evidence of metastatic disease.  He underwent treatment with chemotherapy and radiation, and most recent imaging test from 08/25/2023 with CT chest abdomen pelvis shows no evidence of metastatic disease or recurrence.  We discussed the need for  cystoscopy 3-4 times yearly for the first 2 years to evaluate for recurrence.  From a urinary perspective he is doing well, denies any incontinence or recurrent gross hematuria.  He does have some urinary frequency after radiation, but he is also consuming large volume at least 80 ounces of fluids a day, and I recommended decreasing fluid intake.  We also discussed the effect of radiation on urinary symptoms, and anticipate these will continue to improve.  PVR today is normal at 80ml.  Continue follow-up with [facility] for surveillance imaging for history of urothelial cell carcinoma RTC 6 weeks cystoscopy, plan 3 times yearly for the first 2 years   I spent 45 total minutes on the day of the encounter including pre-visit review of the medical record, face-to-face time with the patient, and post visit ordering of labs/imaging/tests.  Extensive review of outside records from [facility], and history from patient.   Redell JAYSON Burnet, MD  [facility] [address] [place], [place] [address] [phone]

## 2023-09-28 ENCOUNTER — Encounter: Payer: Self-pay | Admitting: Internal Medicine

## 2023-09-30 ENCOUNTER — Ambulatory Visit (INDEPENDENT_AMBULATORY_CARE_PROVIDER_SITE_OTHER): Payer: 59

## 2023-09-30 VITALS — BP 136/78 | Ht 69.0 in | Wt 184.4 lb

## 2023-09-30 DIAGNOSIS — Z0000 Encounter for general adult medical examination without abnormal findings: Secondary | ICD-10-CM | POA: Diagnosis not present

## 2023-09-30 NOTE — Patient Instructions (Addendum)
Joe Hardy , Thank you for taking time to come for your Medicare Wellness Visit. I appreciate your ongoing commitment to your health goals. Please review the following plan we discussed and let me know if I can assist you in the future.   Referrals/Orders/Follow-Ups/Clinician Recommendations: NONE  This is a list of the screening recommended for you and due dates:  Health Maintenance  Topic Date Due   DTaP/Tdap/Td vaccine (1 - Tdap) Never done   Zoster (Shingles) Vaccine (1 of 2) 06/11/1960   COVID-19 Vaccine (7 - 2024-25 season) 05/15/2023   Medicare Annual Wellness Visit  09/29/2024   Pneumonia Vaccine  Completed   Flu Shot  Completed   HPV Vaccine  Aged Out    Advanced directives: (ACP Link)Information on Advanced Care Planning can be found at [facility] Advance Health Care Directives Advance Health Care Directives ([email or web address]   Next Medicare Annual Wellness Visit scheduled for next year: Yes   10/05/24 @ 1:20 PM IN PERSON

## 2023-09-30 NOTE — Progress Notes (Signed)
Subjective:   Joe Hardy is a 83 y.o. male who presents for Medicare Annual/Subsequent preventive examination.  Visit Complete: In person    Cardiac Risk Factors include: advanced age (>70men, >74 women);dyslipidemia;male gender;hypertension;sedentary lifestyle     Objective:    Today's Vitals   09/30/23 1342 09/30/23 1347  BP: 136/78   Weight: 184 lb 6.4 oz (83.6 kg)   Height: 5\' 9"  (1.753 m)   PainSc:  4    Body mass index is 27.23 kg/m.     09/30/2023    2:04 PM 05/08/2023    9:21 AM 01/17/2023   11:08 AM 01/11/2023    2:13 PM 01/06/2023   10:33 AM 12/21/2022   11:26 AM 12/21/2022   11:21 AM  Advanced Directives  Does Patient Have a Medical Advance Directive? No No No No No No No  Would patient like information on creating a medical advance directive? No - Patient declined  No - Patient declined No - Patient declined No - Patient declined Yes (Inpatient - patient defers creating a medical advance directive and declines information at this time)     Current Medications (verified) Outpatient Encounter Medications as of 09/30/2023  Medication Sig   Ascorbic Acid (VITAMIN C) 1000 MG tablet Take 1,000 mg by mouth daily.   atorvastatin (LIPITOR) 20 MG tablet TAKE 1 TABLET BY MOUTH DAILY AT  6PM   b complex vitamins tablet Take 1 tablet by mouth daily.   baclofen (LIORESAL) 10 MG tablet Take 0.5-1 tablets (5-10 mg total) by mouth 2 (two) times daily as needed for muscle spasms.   Blood Pressure Monitoring (SM WRIST CUFF BP MONITOR) MISC 1 Units by Does not apply route 2 (two) times a week.   buPROPion (WELLBUTRIN XL) 150 MG 24 hr tablet Take 450 mg by mouth daily.   busPIRone (BUSPAR) 10 MG tablet Take 1 tablet (10 mg total) by mouth 2 (two) times daily.   Cholecalciferol (VITAMIN D) 50 MCG (2000 UT) tablet Take 4,000 Units by mouth daily.   Cobalamin Combinations (NEURIVA PLUS PO) Take 1 capsule by mouth daily in the afternoon.   cyanocobalamin 1000 MCG tablet Take 1,000  mcg by mouth daily.   cyclobenzaprine (FLEXERIL) 10 MG tablet Take 1 tablet (10 mg total) by mouth 2 (two) times daily.   Iron, Ferrous Sulfate, 325 (65 Fe) MG TABS Take 1 tablet by mouth daily.   levothyroxine (SYNTHROID) 75 MCG tablet Take 1 tablet (75 mcg total) by mouth daily before breakfast.   lisinopril-hydrochlorothiazide (ZESTORETIC) 10-12.5 MG tablet Take 1 tablet by mouth daily.   magnesium 30 MG tablet Take 1 tablet (30 mg total) by mouth daily.   mirtazapine (REMERON) 30 MG tablet Take 30 mg by mouth at bedtime.   terazosin (HYTRIN) 5 MG capsule TAKE 1 CAPSULE BY MOUTH DAILY   traZODone (DESYREL) 150 MG tablet Take 150 mg by mouth at bedtime.   GARLIC PO Take 2 tablets by mouth daily. (Patient not taking: Reported on 09/30/2023)   MIRALAX 17 GM/SCOOP powder Take 17 g by mouth daily. (Patient not taking: Reported on 09/30/2023)   No facility-administered encounter medications on file as of 09/30/2023.    Allergies (verified) Fluoxetine and Venlafaxine   History: Past Medical History:  Diagnosis Date   Bladder stones    Depression    GERD (gastroesophageal reflux disease)    Hyperlipidemia    Hypertension    Iron deficiency anemia    Migraine    Sleep apnea  Past Surgical History:  Procedure Laterality Date   COLONOSCOPY     CYSTOSCOPY WITH LITHOLAPAXY N/A 11/26/2022   Procedure: CYSTOSCOPY WITH LITHOLAPAXY;  Surgeon: Sondra Come, MD;  Location: [facility] ORS;  Service: Urology;  Laterality: N/A;   HOLEP-LASER ENUCLEATION OF THE PROSTATE WITH MORCELLATION N/A 11/26/2022   Procedure: HOLEP-LASER ENUCLEATION OF THE PROSTATE WITH MORCELLATION;  Surgeon: Sondra Come, MD;  Location: [facility] OR[facility];  Service: Urology;  Laterality: N/A;   oral surgery     TONSILLECTOMY     Family History  Problem Relation Age of Onset   Tuberculosis Mother    Asthma Mother    Emphysema Father    Heart attack Father    Social History   Socioeconomic History   Marital status: Divorced     Spouse name: Not on file   Number of children: Not on file   Years of education: Not on file   Highest education level: Bachelor's degree (e.g., BA, AB, BS)  Occupational History   Occupation: retired  Tobacco Use   Smoking status: Former    Current packs/day: 0.00    Average packs/day: 1.5 packs/day for 20.0 years (30.0 ttl pk-yrs)    Types: Cigarettes    Start date: 05/02/1997    Quit date: 05/02/2017    Years since quitting: 6.4    Passive exposure: Past   Smokeless tobacco: Former   Tobacco comments:    Starts and stops related to anxiety rather than drinking alcohol  Vaping Use   Vaping status: Never Used  Substance and Sexual Activity   Alcohol use: No    Alcohol/week: 0.0 standard drinks of alcohol    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)   Drug use: Not Currently    Types: Marijuana   Sexual activity: Not Currently  Other Topics Concern   Not on file  Social History Narrative   Not on file   Social Drivers of Health   Financial Resource Strain: Low Risk  (09/30/2023)   Overall Financial Resource Strain (CARDIA)    Difficulty of Paying Living Expenses: Not hard at all  Food Insecurity: No Food Insecurity (09/30/2023)   Hunger Vital Sign    Worried About Running Out of Food in the Last Year: Never true    Ran Out of Food in the Last Year: Never true  Transportation Needs: No Transportation Needs (09/30/2023)   PRAPARE - Administrator, Civil Service (Medical): No    Lack of Transportation (Non-Medical): No  Physical Activity: Insufficiently Active (09/30/2023)   Exercise Vital Sign    Days of Exercise per Week: 2 days    Minutes of Exercise per Session: 20 min  Stress: No Stress Concern Present (09/30/2023)   [employer] of Occupational Health - Occupational Stress Questionnaire    Feeling of Stress : Only a little  Social Connections: Socially Isolated (09/30/2023)   Social Connection and Isolation Panel [NHANES]    Frequency of  Communication with Friends and Family: Three times a week    Frequency of Social Gatherings with Friends and Family: Once a week    Attends Religious Services: Never    Database administrator or Organizations: No    Attends Engineer, structural: Never    Marital Status: Divorced    Tobacco Counseling Counseling given: Not Answered Tobacco comments: Starts and stops related to anxiety rather than drinking alcohol   Clinical Intake:  Pre-visit preparation completed: Yes  Pain : 0-10 Pain Score: 4  Pain Type: Chronic pain Pain Location: Neck Pain Orientation: Right Pain Descriptors / Indicators: Aching, Discomfort, Constant Pain Onset: More than a month ago Pain Frequency: Constant Pain Relieving Factors: FLEXIRIL  Pain Relieving Factors: FLEXIRIL  BMI - recorded: 27.23 Nutritional Status: BMI 25 -29 Overweight Nutritional Risks: None Diabetes: No  How often do you need to have someone help you when you read instructions, pamphlets, or other written materials from your doctor or pharmacy?: 1 - Never  Interpreter Needed?: No  Information entered by :: Kennedy Bucker, LPN   Activities of Daily Living    09/30/2023    2:06 PM 06/15/2023    3:02 PM  In your present state of health, do you have any difficulty performing the following activities:  Hearing? 0 1  Vision? 0 1  Difficulty concentrating or making decisions? 0 1  Walking or climbing stairs? 1 1  Comment BACK, KNEE PAIN   Dressing or bathing? 0 0  Doing errands, shopping? 0 1  Preparing Food and eating ? N   Using the Toilet? N   In the past six months, have you accidently leaked urine? N   Do you have problems with loss of bowel control? N   Managing your Medications? N   Managing your Finances? N   Housekeeping or managing your Housekeeping? N     Patient Care Team: Smitty Cords, DO as PCP - General (Family Medicine) Lynett Fish, MD as Referring Physician (Psychiatry) Beverlee Nims, LCSW as Social Worker (Psychiatry)  Indicate any recent Medical Services you may have received from other than [facility] providers in the past year (date may be approximate).     Assessment:   This is a routine wellness examination for Joe Hardy.  Hearing/Vision screen Hearing Screening - Comments:: NO AIDS Vision Screening - Comments:: READERS, CATARACT SGY BOTH EYES- [facility]   Goals Addressed             This Visit's Progress    Cut out extra servings         Depression Screen    09/30/2023    1:56 PM 09/08/2023   11:26 AM 06/15/2023    3:01 PM 04/27/2023    2:16 PM 03/24/2023   11:08 AM 01/19/2023    9:45 AM 12/21/2022   11:41 AM  PHQ 2/9 Scores  PHQ - 2 Score 1 2 2 4 2 5 6   PHQ- 9 Score 2 3 5 11 10 12 24     Fall Risk    09/30/2023    2:06 PM 09/08/2023   11:26 AM 06/15/2023    3:02 PM 04/27/2023    2:15 PM 03/24/2023   11:09 AM  Fall Risk   Falls in the past year? 1 1 0 0 0  Number falls in past yr: 0 0  0 0  Injury with Fall? 0 0 0 0 0  Risk for fall due to : History of fall(s)   No Fall Risks No Fall Risks  Follow up Falls evaluation completed;Falls prevention discussed   Falls prevention discussed;Education provided;Falls evaluation completed Falls evaluation completed    MEDICARE RISK AT HOME: Medicare Risk at Home Any stairs in or around the home?: No If so, are there any without handrails?: No Home free of loose throw rugs in walkways, pet beds, electrical cords, etc?: Yes Adequate lighting in your home to reduce risk of falls?: Yes Life alert?: No Use of a cane, walker or w/c?: Yes (CANE ALL THE TIME, WALKER  AT HOME) Grab bars in the bathroom?: No Shower chair or bench in shower?: No Elevated toilet seat or a handicapped toilet?: No  TIMED UP AND GO:  Was the test performed?  Yes  Length of time to ambulate 10 feet: 6 sec Gait slow and steady without use of assistive device    Cognitive Function:    05/13/2016    9:21 AM 04/14/2015     9:34 AM  MMSE - Mini Mental State Exam  Orientation to time 5 5  Orientation to Place 5 5  Registration 3 3  Attention/ Calculation 5 5  Recall 3 3  Language- name 2 objects 2 2  Language- repeat 1 1  Language- follow 3 step command 3 3  Language- read & follow direction 1 1  Write a sentence 1 1  Copy design 1 1  Total score 30 30        09/30/2023    2:08 PM 08/05/2020    3:53 PM 05/08/2019    9:51 AM 05/02/2018   12:23 PM 04/05/2017   11:04 AM  6CIT Screen  What Year? 0 points 0 points 0 points 0 points 0 points  What month? 0 points 0 points 0 points 0 points 0 points  What time? 0 points 0 points 0 points 0 points 0 points  Count back from 20 0 points 0 points 0 points 0 points 0 points  Months in reverse 0 points 0 points 0 points 0 points 0 points  Repeat phrase 2 points 2 points 0 points 2 points 2 points  Total Score 2 points 2 points 0 points 2 points 2 points    Immunizations Immunization History  Administered Date(s) Administered   Fluad Quad(high Dose 65+) 07/27/2019, 06/03/2022   Fluad Trivalent(High Dose 65+) 06/15/2023   Influenza, High Dose Seasonal PF 07/18/2015, 06/23/2016, 07/11/2017, 08/08/2018   Influenza-Unspecified 06/13/2014, 07/28/2020, 06/16/2021   PFIZER(Purple Top)SARS-COV-2 Vaccination 09/20/2019, 10/13/2019, 07/28/2020, 01/16/2021, 06/16/2021   PNEUMOCOCCAL CONJUGATE-20 09/23/2022   Pfizer(Comirnaty)Fall Seasonal Vaccine 12 years and older 06/26/2022   Pneumococcal Conjugate-13 03/13/2014   Pneumococcal Polysaccharide-23 09/13/2012   Zoster, Live 09/13/2012    TDAP status: Due, Education has been provided regarding the importance of this vaccine. Advised may receive this vaccine at local pharmacy or Health Dept. Aware to provide a copy of the vaccination record if obtained from local pharmacy or Health Dept. Verbalized acceptance and understanding.  Flu Vaccine status: Up to date  Pneumococcal vaccine status: Up to date  Covid-19  vaccine status: Completed vaccines  Qualifies for Shingles Vaccine? Yes   Zostavax completed Yes   Shingrix Completed?: No.    Education has been provided regarding the importance of this vaccine. Patient has been advised to call insurance company to determine out of pocket expense if they have not yet received this vaccine. Advised may also receive vaccine at local pharmacy or Health Dept. Verbalized acceptance and understanding.  Screening Tests Health Maintenance  Topic Date Due   DTaP/Tdap/Td (1 - Tdap) Never done   Zoster Vaccines- Shingrix (1 of 2) 06/11/1960   COVID-19 Vaccine (7 - 2024-25 season) 05/15/2023   Medicare Annual Wellness (AWV)  09/29/2024   Pneumonia Vaccine 42+ Years old  Completed   INFLUENZA VACCINE  Completed   HPV VACCINES  Aged Out    Health Maintenance  Health Maintenance Due  Topic Date Due   DTaP/Tdap/Td (1 - Tdap) Never done   Zoster Vaccines- Shingrix (1 of 2) 06[birth year]11/1960   COVID-19 Vaccine (7 -  2024-25 season) 05/15/2023    Colorectal cancer screening: No longer required.   Lung Cancer Screening: (Low Dose CT Chest recommended if Age 53-80 years, 20 pack-year currently smoking OR have quit w/in 15years.) does not qualify.    Additional Screening:  Hepatitis C Screening: does not qualify; Completed NO  Vision Screening: Recommended annual ophthalmology exams for early detection of glaucoma and other disorders of the eye. Is the patient up to date with their annual eye exam?  Yes  Who is the provider or what is the name of the office in which the patient attends annual eye exams? [facility] If pt is not established with a provider, would they like to be referred to a provider to establish care? No .   Dental Screening: Recommended annual dental exams for proper oral hygiene   Community Resource Referral / Chronic Care Management: CRR required this visit?  No   CCM required this visit?  No     Plan:     I have personally reviewed  and noted the following in the patient's chart:   Medical and social history Use of alcohol, tobacco or illicit drugs  Current medications and supplements including opioid prescriptions. Patient is not currently taking opioid prescriptions. Functional ability and status Nutritional status Physical activity Advanced directives List of other physicians Hospitalizations, surgeries, and ER visits in previous 12 months Vitals Screenings to include cognitive, depression, and falls Referrals and appointments  In addition, I have reviewed and discussed with patient certain preventive protocols, quality metrics, and best practice recommendations. A written personalized care plan for preventive services as well as general preventive health recommendations were provided to patient.     Hal Hope, LPN   05/27/7828   After Visit Summary: (In Person-Declined) Patient declined AVS at this time.  Nurse Notes: NONE

## 2023-10-02 ENCOUNTER — Other Ambulatory Visit: Payer: Self-pay

## 2023-10-02 DIAGNOSIS — C673 Malignant neoplasm of anterior wall of bladder: Secondary | ICD-10-CM | POA: Diagnosis not present

## 2023-10-02 DIAGNOSIS — C689 Malignant neoplasm of urinary organ, unspecified: Secondary | ICD-10-CM | POA: Diagnosis not present

## 2023-10-05 ENCOUNTER — Ambulatory Visit: Payer: 59 | Admitting: Family Medicine

## 2023-10-06 ENCOUNTER — Ambulatory Visit: Payer: 59 | Admitting: Urology

## 2023-10-20 DIAGNOSIS — Z923 Personal history of irradiation: Secondary | ICD-10-CM | POA: Diagnosis not present

## 2023-10-20 DIAGNOSIS — C679 Malignant neoplasm of bladder, unspecified: Secondary | ICD-10-CM | POA: Diagnosis not present

## 2023-10-20 DIAGNOSIS — C7982 Secondary malignant neoplasm of genital organs: Secondary | ICD-10-CM | POA: Diagnosis not present

## 2023-10-20 DIAGNOSIS — M542 Cervicalgia: Secondary | ICD-10-CM | POA: Diagnosis not present

## 2023-10-20 DIAGNOSIS — G8929 Other chronic pain: Secondary | ICD-10-CM | POA: Diagnosis not present

## 2023-10-20 DIAGNOSIS — C689 Malignant neoplasm of urinary organ, unspecified: Secondary | ICD-10-CM | POA: Diagnosis not present

## 2023-10-23 ENCOUNTER — Other Ambulatory Visit: Payer: Self-pay | Admitting: Family Medicine

## 2023-10-23 DIAGNOSIS — E034 Atrophy of thyroid (acquired): Secondary | ICD-10-CM

## 2023-10-24 DIAGNOSIS — L570 Actinic keratosis: Secondary | ICD-10-CM | POA: Diagnosis not present

## 2023-10-24 DIAGNOSIS — D485 Neoplasm of uncertain behavior of skin: Secondary | ICD-10-CM | POA: Diagnosis not present

## 2023-10-24 DIAGNOSIS — C44311 Basal cell carcinoma of skin of nose: Secondary | ICD-10-CM | POA: Diagnosis not present

## 2023-10-24 DIAGNOSIS — D0439 Carcinoma in situ of skin of other parts of face: Secondary | ICD-10-CM | POA: Diagnosis not present

## 2023-10-25 ENCOUNTER — Encounter: Payer: Self-pay | Admitting: Internal Medicine

## 2023-10-25 NOTE — Telephone Encounter (Signed)
Requested Prescriptions  Pending Prescriptions Disp Refills   lisinopril-hydrochlorothiazide (ZESTORETIC) 10-12.5 MG tablet [Pharmacy Med Name: Lisinopril-hydroCHLOROthiazide 10-12.5 MG Oral Tablet] 100 tablet 0    Sig: TAKE 1 TABLET BY MOUTH DAILY     Cardiovascular:  ACEI + Diuretic Combos Failed - 10/25/2023  8:25 AM      Failed - Na in normal range and within 180 days    Sodium  Date Value Ref Range Status  05/08/2023 132 (L) 135 - 145 mmol/L Final  04/20/2017 140 134 - 144 mmol/L Final         Passed - K in normal range and within 180 days    Potassium  Date Value Ref Range Status  05/08/2023 3.5 3.5 - 5.1 mmol/L Final         Passed - Cr in normal range and within 180 days    Creat  Date Value Ref Range Status  09/23/2022 1.04 0.70 - 1.22 mg/dL Final   Creatinine, Ser  Date Value Ref Range Status  05/08/2023 0.78 0.61 - 1.24 mg/dL Final         Passed - eGFR is 30 or above and within 180 days    GFR, Est African American  Date Value Ref Range Status  03/09/2021 98 > OR = 60 mL/min/1.4m2 Final   GFR, Est Non African American  Date Value Ref Range Status  03/09/2021 85 > OR = 60 mL/min/1.40m2 Final   GFR, Estimated  Date Value Ref Range Status  05/08/2023 >60 >60 mL/min Final    Comment:    (NOTE) Calculated using the CKD-EPI Creatinine Equation (2021)    eGFR  Date Value Ref Range Status  09/23/2022 72 > OR = 60 mL/min/1.43m2 Final         Passed - Patient is not pregnant      Passed - Last BP in normal range    BP Readings from Last 1 Encounters:  09/30/23 136/78         Passed - Valid encounter within last 6 months    Recent Outpatient Visits           1 month ago Chronic neck pain   [place] [facility] Smitty Cords, DO   4 months ago Chronic neck pain   [place] [facility] Smitty Cords, DO   6 months ago Acute bilateral low back pain with bilateral sciatica   [address] [facility] Smitty Cords, DO   7 months ago Urothelial carcinoma [facility])   [place] [facility] Smitty Cords, DO   9 months ago Urothelial carcinoma [facility])   [place] [facility] Smitty Cords, DO       Future Appointments             In 4 months Althea Charon, Netta Neat, DO [facility]             levothyroxine (SYNTHROID) 75 MCG tablet [Pharmacy Med Name: Levothyroxine Sodium 75 MCG Oral Tablet] 90 tablet 1    Sig: TAKE 1 TABLET BY MOUTH DAILY  BEFORE BREAKFAST     Endocrinology:  Hypothyroid Agents Passed - 10/25/2023  8:25 AM      Passed - TSH in normal range and within 360 days    TSH  Date Value Ref Range Status  05/08/2023 2.394 0.350 - 4.500 uIU/mL Final    Comment:  Performed by a 3rd Generation assay with a functional sensitivity of <=0.01 uIU/mL. Performed at [facility], [address] [place], [place] [address]   09/23/2022 2.78 0.40 - 4.50 mIU/L Final         Passed - Valid encounter within last 12 months    Recent Outpatient Visits           1 month ago Chronic neck pain   [place] [facility] Smitty Cords, DO   4 months ago Chronic neck pain   [place] [facility] Smitty Cords, DO   6 months ago Acute bilateral low back pain with bilateral sciatica   [place] [facility] Smitty Cords, DO   7 months ago Urothelial carcinoma [facility])   [place] [facility] Smitty Cords, DO   9 months ago Urothelial carcinoma [facility])   [place] [facility] Smitty Cords, DO       Future Appointments             In 4 months Althea Charon, Netta Neat, DO [place] [facility], [place]

## 2023-10-27 ENCOUNTER — Ambulatory Visit: Payer: 59 | Admitting: Family Medicine

## 2023-10-31 ENCOUNTER — Encounter: Payer: Self-pay | Admitting: Family Medicine

## 2023-10-31 ENCOUNTER — Ambulatory Visit: Payer: 59 | Admitting: Family Medicine

## 2023-10-31 VITALS — BP 152/90 | HR 117 | Ht 69.0 in | Wt 188.0 lb

## 2023-10-31 DIAGNOSIS — H6123 Impacted cerumen, bilateral: Secondary | ICD-10-CM | POA: Diagnosis not present

## 2023-10-31 NOTE — Patient Instructions (Addendum)
Thank you for coming to the office today.  You have thick impacted ear wax (cerumen) blocking ear canals and ear drums. This is the most likely cause of reduced hearing and ear pain and discomfort. - We were able to remove almost all of the ear wax with flushing in the office today  Recommend using the counter Debrox (Carbamide peroxide), use on both sides following instructions on bottle, pharmacist will direct you to the appropriate ear drops if you need help. May take a week or more.  Avoid using Q-tips inside ears, as this can push wax deeper, but you can try to use rolled up kleenex as a wick to absorb fluid and wax as well.  If you are not making progress with ear wax removal at home, or the problem keeps coming back, please notify our office or return for re-evaluation, and we can discuss referral to ENT office for more formal ear wax removal.   Please schedule a Follow-up Appointment to: Return if symptoms worsen or fail to improve.  If you have any other questions or concerns, please feel free to call the office or send a message through MyChart. You may also schedule an earlier appointment if necessary.  Additionally, you may be receiving a survey about your experience at our office within a few days to 1 week by e-mail or mail. We value your feedback.  [name], DO [facility], [place]

## 2023-10-31 NOTE — Progress Notes (Signed)
 Subjective:    Patient ID: Brydon Spahr, male    DOB: 10/03/1940, 83 y.o.   MRN: [id number]  Rush Salce is a 83 y.o. male presenting on 10/31/2023 for Cerumen Impaction   HPI  Discussed the use of AI scribe software for clinical note transcription with the patient, who gave verbal consent to proceed.  History of Present Illness   Dilraj Killgore is an 83 year old male who presents with hearing loss in the right ear and symptoms of dehydration.  He experiences hearing loss in his right ear, which he attributes to a buildup of earwax. He has used Debrox, an over-the-counter earwax removal aid, with minimal relief. The hearing loss is intermittent, with the right ear more affected than the left. The left ear has only 'gone out' once or twice, whereas the right ear has been problematic more frequently. He recalls a previous experience where applying pressure near the ear temporarily restored his hearing.  He feels dehydrated, with symptoms of dry mouth that began the previous day. He wakes up multiple times during the night due to thirst. He relates this to a past experience where he had severe dry mouth after trying new dental products, which led to a hospital visit. During that incident, he experienced a lack of control over his lower legs, which he believes was related to dehydration. He has not worn his dentures recently, which he thinks might have contributed to the current episode of dry mouth.      Dermatology Multiple areas on face and upper body biopsy per Dr Ebony Cargo for evaluation of skin cancer      09/30/2023    1:56 PM 09/08/2023   11:26 AM 06/15/2023    3:01 PM  Depression screen PHQ 2/9  Decreased Interest 0 1 1  Down, Depressed, Hopeless 1 1 1   PHQ - 2 Score 1 2 2   Altered sleeping 0 1 0  Tired, decreased energy 1 0 2  Change in appetite 0 0 0  Feeling bad or failure about yourself  0 0   Trouble concentrating 0 0 0  Moving slowly or fidgety/restless 0 0  1  Suicidal thoughts 0 0 0  PHQ-9 Score 2 3 5   Difficult doing work/chores   Somewhat difficult       09/08/2023   11:26 AM 06/15/2023    3:01 PM 04/27/2023    2:16 PM 03/24/2023   11:09 AM  GAD 7 : Generalized Anxiety Score  Nervous, Anxious, on Edge 1 2 2 2   Control/stop worrying 2 1 2 2   Worry too much - different things 0 0 2 1  Trouble relaxing 1 1 2 2   Restless 0 0 2 1  Easily annoyed or irritable 0 0 2 1  Afraid - awful might happen 1 1 2 1   Total GAD 7 Score 5 5 14 10   Anxiety Difficulty   Very difficult Somewhat difficult    Social History   Tobacco Use   Smoking status: Former    Current packs/day: 0.00    Average packs/day: 1.5 packs/day for 20.0 years (30.0 ttl pk-yrs)    Types: Cigarettes    Start date: 05/02/1997    Quit date: 05/02/2017    Years since quitting: 6.5    Passive exposure: Past   Smokeless tobacco: Former   Tobacco comments:    Starts and stops related to anxiety rather than drinking alcohol  Vaping Use   Vaping status: Never Used  Substance Use Topics  Alcohol use: No    Alcohol/week: 0.0 standard drinks of alcohol    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  Constitutional:      General: He is not in acute distress.    Appearance: Normal appearance. He is well-developed. He is not diaphoretic.     Comments: Well-appearing, comfortable, cooperative  HENT:     Head: Normocephalic and atraumatic.     Right Ear: There is impacted cerumen.     Left Ear: There is impacted cerumen.  Eyes:     General:        Right  eye: No discharge.        Left eye: No discharge.     Conjunctiva/sclera: Conjunctivae normal.  Cardiovascular:     Rate and Rhythm: Normal rate.  Pulmonary:     Effort: Pulmonary effort is normal.  Skin:    General: Skin is warm and dry.     Findings: No erythema or rash.  Neurological:     Mental Status: He is alert and oriented to person, place, and time.  Psychiatric:        Mood and Affect: Mood normal.        Behavior: Behavior normal.        Thought Content: Thought content normal.     Comments: Well groomed, good eye contact, normal speech and thoughts     Ear Cerumen Removal  Date/Time: 10/31/2023 10:45 AM  Performed by: Smitty Cords, DO Authorized by: Smitty Cords, DO   Anesthesia: Local Anesthetic: none Ceruminolytics applied: Ceruminolytics applied prior to the procedure. Location details: right ear Patient tolerance: patient tolerated the procedure well with no immediate complications Comments: R ear only, large amount of cerumen removed by flushing. Still some cerumen remains, failed to entirely clear. Procedure type: irrigation  Sedation: Patient sedated: no       Results for orders placed or performed in visit on 09/21/23  Bladder Scan (Post Void Residual) in office   Collection Time: 09/21/23  3:44 PM  Result Value Ref Range   Scan Result 80ml       Assessment & Plan:   Problem List Items Addressed This Visit   None Visit Diagnoses       Bilateral impacted cerumen    -  Primary     Bilateral hearing loss due to cerumen impaction            Cerumen Impaction Bilateral cerumen impaction, more severe in the right ear. Patient attempted self-treatment with Debrox with minimal improvement. -Perform RIGHT ear irrigation to remove impacted cerumen. See note - Moderate success removal of cerumen, still limited hearing, remaining cerumen not budging - He should use Debrox at home regularly until repeat flushing  visit  Dermatological Concerns Patient recently had biopsies performed at dermatology for suspicious skin lesions. -Await biopsy results from dermatology.  Dehydration Patient reports symptoms of dry mouth and increased thirst. -  Encourage increased fluid intake.     Orders Placed This Encounter  Procedures   Ear Cerumen Removal    This order was created via procedure documentation    No orders of the defined types were placed in this encounter.   Follow up plan: Return if symptoms worsen or fail to improve.   [name], DO [facility] 10/31/2023, 10:42 AM

## 2023-11-02 ENCOUNTER — Other Ambulatory Visit: Payer: Self-pay | Admitting: Urology

## 2023-11-04 ENCOUNTER — Encounter: Payer: Self-pay | Admitting: Family Medicine

## 2023-11-04 ENCOUNTER — Ambulatory Visit: Payer: 59 | Admitting: Family Medicine

## 2023-11-04 VITALS — BP 130/80 | HR 103 | Ht 69.0 in

## 2023-11-04 DIAGNOSIS — H6123 Impacted cerumen, bilateral: Secondary | ICD-10-CM | POA: Diagnosis not present

## 2023-11-04 NOTE — Progress Notes (Signed)
 Subjective:    Patient ID: Joe Hardy, male    DOB: Jun 30, 1941, 83 y.o.   MRN: [id number]  Joe Hardy is a 83 y.o. male presenting on 11/04/2023 for Cerumen Impaction   HPI  Discussed the use of AI scribe software for clinical note transcription with the patient, who gave verbal consent to proceed.  History of Present Illness   Joe Hardy is an 83 year old male who presents with earwax buildup and hearing difficulties.  He has been experiencing significant earwax buildup, leading to hearing difficulties. After some wax removal, his hearing improved, particularly in the right ear, but there is still some remaining wax, especially in the left ear. The wax is described as thin and flap-like, making it difficult to remove completely. He has been using ear drops to manage the wax buildup.  He has had biopsies on his nose and forehead, resulting in scabs. He accidentally rubbed off a scab on his nose while putting on a sweater, but it is not bleeding.  He mentions a neck problem that is aggravated by certain movements, such as laying his head on two pillows. This issue has been persistent and affects his daily activities.           09/30/2023    1:56 PM 09/08/2023   11:26 AM 06/15/2023    3:01 PM  Depression screen PHQ 2/9  Decreased Interest 0 1 1  Down, Depressed, Hopeless 1 1 1   PHQ - 2 Score 1 2 2   Altered sleeping 0 1 0  Tired, decreased energy 1 0 2  Change in appetite 0 0 0  Feeling bad or failure about yourself  0 0   Trouble concentrating 0 0 0  Moving slowly or fidgety/restless 0 0 1  Suicidal thoughts 0 0 0  PHQ-9 Score 2 3 5   Difficult doing work/chores   Somewhat difficult       09/08/2023   11:26 AM 06/15/2023    3:01 PM 04/27/2023    2:16 PM 03/24/2023   11:09 AM  GAD 7 : Generalized Anxiety Score  Nervous, Anxious, on Edge 1 2 2 2   Control/stop worrying 2 1 2 2   Worry too much - different things 0 0 2 1  Trouble relaxing 1 1 2 2   Restless 0 0  2 1  Easily annoyed or irritable 0 0 2 1  Afraid - awful might happen 1 1 2 1   Total GAD 7 Score 5 5 14 10   Anxiety Difficulty   Very difficult Somewhat difficult    Social History   Tobacco Use   Smoking status: Former    Current packs/day: 0.00    Average packs/day: 1.5 packs/day for 20.0 years (30.0 ttl pk-yrs)    Types: Cigarettes    Start date: 05/02/1997    Quit date: 05/02/2017    Years since quitting: 6.5    Passive exposure: Past   Smokeless tobacco: Former   Tobacco comments:    Starts and stops related to anxiety rather than drinking alcohol  Vaping Use   Vaping status: Never Used  Substance Use Topics   Alcohol use: No    Alcohol/week: 0.0 standard drinks of alcohol    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  Constitutional:      General: He is not in acute distress.    Appearance: Normal appearance. He is well-developed. He is not diaphoretic.     Comments: Well-appearing, comfortable, cooperative  HENT:     Head: Normocephalic and atraumatic.     Right Ear: There is impacted cerumen.     Left Ear: There is impacted cerumen.  Eyes:     General:        Right eye: No discharge.        Left eye: No discharge.     Conjunctiva/sclera: Conjunctivae normal.  Cardiovascular:     Rate and Rhythm: Normal rate.  Pulmonary:     Effort: Pulmonary effort is normal.  Skin:    General: Skin is warm and dry.     Findings: No erythema or rash.  Neurological:     Mental Status: He is alert and oriented to person, place, and time.  Psychiatric:        Mood and Affect: Mood  normal.        Behavior: Behavior normal.        Thought Content: Thought content normal.     Comments: Well groomed, good eye contact, normal speech and thoughts    Ear Cerumen Removal  Date/Time: 11/04/2023 6:56 PM  Performed by: Joe Cords, DO Authorized by: Joe Cords, DO   Anesthesia: Local Anesthetic: none Ceruminolytics applied: Ceruminolytics applied prior to the procedure. Location details: left ear and right ear Patient tolerance: patient tolerated the procedure well with no immediate complications Procedure type: irrigation  Sedation: Patient sedated: no       Results for orders placed or performed in visit on 09/21/23  Bladder Scan (Post Void Residual) in office   Collection Time: 09/21/23  3:44 PM  Result Value Ref Range   Scan Result 80ml       Assessment & Plan:   Problem List Items Addressed This Visit   None Visit Diagnoses       Bilateral impacted cerumen    -  Primary         Cerumen Impaction Improved hearing after multiple ear irrigations. Thin, flaky wax noted in both ears, more difficult to remove. -Continue Debrox drops once a week to maintain ear canal cleanliness. -Scheduled follow-up in June 2025 for further evaluation.  Skin Lesions Biopsies performed on nose and forehead, scabs noted. No active bleeding. -Continue to monitor healing process.      Orders Placed This Encounter  Procedures   Ear Cerumen Removal    This order was created via procedure documentation    No orders of the defined types were placed in this encounter.   Follow up plan: Return if symptoms worsen or fail to improve.   Joe Pilar, DO [facility] 11/04/2023, 10:40 AM

## 2023-11-04 NOTE — Patient Instructions (Addendum)

## 2023-11-10 DIAGNOSIS — C44329 Squamous cell carcinoma of skin of other parts of face: Secondary | ICD-10-CM | POA: Diagnosis not present

## 2023-11-10 DIAGNOSIS — D0439 Carcinoma in situ of skin of other parts of face: Secondary | ICD-10-CM | POA: Diagnosis not present

## 2023-11-22 ENCOUNTER — Ambulatory Visit (INDEPENDENT_AMBULATORY_CARE_PROVIDER_SITE_OTHER): Payer: 59 | Admitting: Urology

## 2023-11-22 VITALS — BP 158/90 | HR 108 | Ht 69.0 in | Wt 187.4 lb

## 2023-11-22 DIAGNOSIS — C689 Malignant neoplasm of urinary organ, unspecified: Secondary | ICD-10-CM | POA: Diagnosis not present

## 2023-11-22 MED ORDER — SULFAMETHOXAZOLE-TRIMETHOPRIM 800-160 MG PO TABS
1.0000 | ORAL_TABLET | Freq: Once | ORAL | Status: AC
  Administered 2023-11-22: 1 via ORAL

## 2023-11-22 MED ORDER — LIDOCAINE HCL URETHRAL/MUCOSAL 2 % EX GEL
1.0000 | Freq: Once | CUTANEOUS | Status: AC
Start: 2023-11-22 — End: 2023-11-22
  Administered 2023-11-22: 1 via URETHRAL

## 2023-11-22 NOTE — Progress Notes (Signed)
 Cystoscopy Procedure Note:  Indication:  83 year old male who originally presented with Foley dependent urinary retention, failed a voiding trial, CT showed 100 g prostate, PSA was normal, and he opted for HOLEP.  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  He was managed at [facility] with trimodal therapy with chemotherapy and radiation, PET scan showed no evidence of metastatic disease, most recently CT in December 2024 showed no evidence of recurrence.  After informed consent and discussion of the procedure and its risks, Joe Hardy was positioned and prepped in the standard fashion. Cystoscopy was performed with a flexible cystoscope. The urethra, bladder neck and entire bladder was visualized in a standard fashion. The prostatic fossa was open from prior HOLEP, few small stones at the base of the bladder the ureteral orifices were visualized in their normal location and orientation.  Bladder mucosa grossly normal throughout  Findings: No evidence of recurrence, small stones near the bladder neck asymptomatic and he defers treatment at this time  Assessment and Plan: No evidence of recurrence, continue cystoscopy every 3 to 4 months, will follow-up cross-sectional imaging ordered for 3/19 at [facility], [place] 11/22/2023

## 2023-11-25 ENCOUNTER — Telehealth: Payer: Self-pay

## 2023-11-25 ENCOUNTER — Other Ambulatory Visit: Payer: Self-pay | Admitting: Family Medicine

## 2023-11-25 DIAGNOSIS — M62838 Other muscle spasm: Secondary | ICD-10-CM

## 2023-11-25 DIAGNOSIS — G8929 Other chronic pain: Secondary | ICD-10-CM

## 2023-11-25 NOTE — Telephone Encounter (Signed)
 These are duplicate muscle relaxants.  Can you clarify with patient which one he is taking and how often before we refill one?  It looks like originally he was on Baclofen in 2024, he said it was ineffective, so we switched to Flexeril.  Now I see both on list still.  Thank you!  [name], DO [facility] 11/25/2023, 5:55 PM

## 2023-11-28 MED ORDER — BACLOFEN 10 MG PO TABS: 5.0000 mg | ORAL_TABLET | Freq: Two times a day (BID) | ORAL | 1 refills | Status: DC | PRN

## 2023-11-28 MED ORDER — CYCLOBENZAPRINE HCL 10 MG PO TABS: 10.0000 mg | ORAL_TABLET | Freq: Two times a day (BID) | ORAL | 1 refills | Status: DC | PRN

## 2023-11-28 NOTE — Telephone Encounter (Signed)
 Requested Prescriptions  Pending Prescriptions Disp Refills   busPIRone (BUSPAR) 10 MG tablet [Pharmacy Med Name: BUSPIRONE  10MG   TAB] 180 tablet 1    Sig: TAKE 1 TABLET BY MOUTH TWICE  DAILY     Psychiatry: Anxiolytics/Hypnotics - Non-controlled Passed - 11/28/2023  9:55 AM      Passed - Valid encounter within last 12 months    Recent Outpatient Visits           2 months ago Chronic neck pain   [place] [facility] Smitty Cords, DO   5 months ago Chronic neck pain   [place] [facility] Smitty Cords, DO   7 months ago Acute bilateral low back pain with bilateral sciatica   [place] [facility] Smitty Cords, DO   8 months ago Urothelial carcinoma [facility])   [place] [facility] Smitty Cords, DO   10 months ago Urothelial carcinoma [facility])   [place] [facility] Smitty Cords, DO       Future Appointments             In 3 months Althea Charon, Netta Neat, DO  [facility]

## 2023-11-28 NOTE — Telephone Encounter (Signed)
 Ok I have signed orders for both to mail order [facility]  Instructions are to take baclofen for his back and flexeril for his neck. Cannot take them both the same day.  [name], DO [facility] 11/28/2023, 9:47 AM

## 2023-11-28 NOTE — Addendum Note (Signed)
 Addended by: Smitty Cords on: 11/28/2023 09:47 AM   Modules accepted: Orders

## 2023-11-30 DIAGNOSIS — C673 Malignant neoplasm of anterior wall of bladder: Secondary | ICD-10-CM | POA: Diagnosis not present

## 2023-11-30 DIAGNOSIS — Z87891 Personal history of nicotine dependence: Secondary | ICD-10-CM | POA: Diagnosis not present

## 2023-11-30 DIAGNOSIS — Z79899 Other long term (current) drug therapy: Secondary | ICD-10-CM | POA: Diagnosis not present

## 2023-11-30 DIAGNOSIS — M542 Cervicalgia: Secondary | ICD-10-CM | POA: Diagnosis not present

## 2023-11-30 DIAGNOSIS — R918 Other nonspecific abnormal finding of lung field: Secondary | ICD-10-CM | POA: Diagnosis not present

## 2023-11-30 DIAGNOSIS — Z08 Encounter for follow-up examination after completed treatment for malignant neoplasm: Secondary | ICD-10-CM | POA: Diagnosis not present

## 2023-11-30 DIAGNOSIS — C689 Malignant neoplasm of urinary organ, unspecified: Secondary | ICD-10-CM | POA: Diagnosis not present

## 2023-11-30 DIAGNOSIS — N3289 Other specified disorders of bladder: Secondary | ICD-10-CM | POA: Diagnosis not present

## 2023-11-30 DIAGNOSIS — I10 Essential (primary) hypertension: Secondary | ICD-10-CM | POA: Diagnosis not present

## 2023-11-30 DIAGNOSIS — Z8551 Personal history of malignant neoplasm of bladder: Secondary | ICD-10-CM | POA: Diagnosis not present

## 2023-12-26 ENCOUNTER — Ambulatory Visit: Payer: Self-pay

## 2023-12-26 NOTE — Telephone Encounter (Signed)
  Chief Complaint: Weakness caused fall Symptoms: intermittent leg weakness Frequency: X 1 last night Pertinent Negatives: Patient denies injury, denies current weakness, heart racing, chest pain, difficulty breathing, dizziness, headaches, tingling, numbness Disposition: [] ED /[] Urgent Care (no appt availability in office) / [x] Appointment(In office/virtual)/ []  [place] [facility]/ [] Home Care/ [] Refused Recommended Disposition /[] [place] [facility]/ []  Follow-up with PCP Additional Notes:   Called earlier was advised on ER for fall without injury last night, he decided not to go, calling back to give additional information and feels he does not need to go to ED. States this is a long term symptom-intermittent leg weakness. He is currently asymptomatic.  Last night he was watching a golf tournament and sat for extended period of time. When he went to bed he needed to get up to grab a couple things, he was rushing around with cane, got back into bed when he felt thirsty, got a drink and then fell before getting back into bed again.  He did not sustain injury, did not hit his head. Fell due to wobbly leg weakness, he didn't have assistive device with him at that time. He had to call a neighbor to help him up. This morning he is using his walker to ambulate without issue.  He noted if he doesn't get his feet stable on the floor before standing his legs will wobble and get weak.  History of prostate cancer. Noted dry mouth. Started new kind of denture paste, he noted he could not quench his thirst last night, which he feels was from denture paste. He was voiding extra yesterday due to drinking extra water.  History of hospitalization from weakness. Acute visit scheduled with PCP on 12/29/23. Educated on care advice as documented in protocol, patient verbalized understanding. Discussed reasons to call back or call for EMS.      Reason for Disposition  [1] MILD weakness (i.e., does not interfere  with ability to work, go to school, normal activities) AND [2] persists > 1 week  Protocols used: Weakness (Generalized) and Fatigue-A-AH

## 2023-12-26 NOTE — Telephone Encounter (Signed)
 No further action required. Keep 4/17 apt. I reviewed updated triage note. No new concerns that warrant him to be seen sooner than 3 days  Domingo Friend, DO [facility] 12/26/2023, 12:31 PM

## 2023-12-26 NOTE — Telephone Encounter (Signed)
 Copied from CRM [id number]. Topic: Clinical - Red Word Triage >> Dec 26, 2023  8:20 AM Oddis Bench wrote: Red Word that prompted transfer to Nurse Triage: Patient is stating that he has not been feeling good for the past couple of days, he is stating that he has been issues getting up and he was walking to the front room and he feel, he is stating that his leg is buckling he states that this has happen before when he had a Cather that led to issues with his prostate and had to have cancer treatment. He has had cotton mouth all night he is drinking water and urinating he has drunk 50 oz of water since last night. No pain in legs or tingling.  Chief Complaint: dry mouth, fall last night, generalized weakness Symptoms: see above Frequency: constant Pertinent Negatives: Patient denies cp, sob, Disposition: [x] ED /[] Urgent Care (no appt availability in office) / [] Appointment(In office/virtual)/ []  [place] [facility]/ [] Home Care/ [] Refused Recommended Disposition /[] [place] [facility]/ []  Follow-up with PCP Additional Notes: TO ER. Pcp office updated.   Reason for Disposition  [1] SEVERE weakness (i.e., unable to walk or barely able to walk, requires support) AND [2] new-onset or worsening  Answer Assessment - Initial Assessment Questions 1. MECHANISM: "How did the fall happen?"     Was walking without a walker and fell, legs gave out 2. DOMESTIC VIOLENCE AND ELDER ABUSE SCREENING: "Did you fall because someone pushed you or tried to hurt you?" If Yes, ask: "Are you safe now?"     denies 3. ONSET: "When did the fall happen?" (e.g., minutes, hours, or days ago)     Last night 4. LOCATION: "What part of the body hit the ground?" (e.g., back, buttocks, head, hips, knees, hands, head, stomach)     TO ER 5. INJURY: "Did you hurt (injure) yourself when you fell?" If Yes, ask: "What did you injure? Tell me more about this?" (e.g., body area; type of injury; pain severity)"     ER 6. PAIN: "Is  there any pain?" If Yes, ask: "How bad is the pain?" (e.g., Scale 1-10; or mild,  moderate, severe)   - NONE (0): No pain   - MILD (1-3): Doesn't interfere with normal activities    - MODERATE (4-7): Interferes with normal activities or awakens from sleep    - SEVERE (8-10): Excruciating pain, unable to do any normal activities      ER 7. SIZE: For cuts, bruises, or swelling, ask: "How large is it?" (e.g., inches or centimeters)      To ER 8. PREGNANCY: "Is there any chance you are pregnant?" "When was your last menstrual period?"     To ER 9. OTHER SYMPTOMS: "Do you have any other symptoms?" (e.g., dizziness, fever, weakness; new onset or worsening).      To ER 10. CAUSE: "What do you think caused the fall (or falling)?" (e.g., tripped, dizzy spell)       To ER  Protocols used: Falls and [facility]

## 2023-12-28 ENCOUNTER — Ambulatory Visit: Admitting: Family Medicine

## 2023-12-29 ENCOUNTER — Encounter: Payer: Self-pay | Admitting: Family Medicine

## 2023-12-29 ENCOUNTER — Ambulatory Visit (INDEPENDENT_AMBULATORY_CARE_PROVIDER_SITE_OTHER): Admitting: Family Medicine

## 2023-12-29 VITALS — BP 140/90 | HR 114 | Ht 69.0 in | Wt 181.5 lb

## 2023-12-29 DIAGNOSIS — M542 Cervicalgia: Secondary | ICD-10-CM

## 2023-12-29 DIAGNOSIS — G8929 Other chronic pain: Secondary | ICD-10-CM | POA: Diagnosis not present

## 2023-12-29 DIAGNOSIS — R2681 Unsteadiness on feet: Secondary | ICD-10-CM | POA: Diagnosis not present

## 2023-12-29 DIAGNOSIS — M62838 Other muscle spasm: Secondary | ICD-10-CM

## 2023-12-29 DIAGNOSIS — R296 Repeated falls: Secondary | ICD-10-CM | POA: Diagnosis not present

## 2023-12-29 DIAGNOSIS — R29898 Other symptoms and signs involving the musculoskeletal system: Secondary | ICD-10-CM
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
## 2023-12-29 NOTE — Patient Instructions (Signed)
 Thank you for coming to the office today.    Please schedule a Follow-up Appointment to: No follow-ups on file.  If you have any other questions or concerns, please feel free to call the office or send a message through MyChart. You may also schedule an earlier appointment if necessary.  Additionally, you may be receiving a survey about your experience at our office within a few days to 1 week by e-mail or mail. We value your feedback.  [name], DO [facility], [place]

## 2023-12-30 ENCOUNTER — Telehealth: Payer: Self-pay

## 2023-12-30 NOTE — Telephone Encounter (Signed)
 Copied from CRM [id number]. Topic: General - Other >> Dec 30, 2023  2:14 PM Felizardo Hotter wrote: Reason for CRM: Pt called would like to share information with Dr. Romeo Co regarding denture paste associated with dehydration.

## 2024-01-05 ENCOUNTER — Encounter: Payer: Self-pay | Admitting: Internal Medicine

## 2024-01-06 DIAGNOSIS — M6281 Muscle weakness (generalized): Secondary | ICD-10-CM | POA: Diagnosis not present

## 2024-01-06 DIAGNOSIS — M62838 Other muscle spasm: Secondary | ICD-10-CM | POA: Diagnosis not present

## 2024-01-06 DIAGNOSIS — G8929 Other chronic pain: Secondary | ICD-10-CM | POA: Diagnosis not present

## 2024-01-06 DIAGNOSIS — Z9181 History of falling: Secondary | ICD-10-CM | POA: Diagnosis not present

## 2024-01-06 DIAGNOSIS — Z87891 Personal history of nicotine dependence: Secondary | ICD-10-CM | POA: Diagnosis not present

## 2024-01-06 DIAGNOSIS — R296 Repeated falls: Secondary | ICD-10-CM | POA: Diagnosis not present

## 2024-01-06 DIAGNOSIS — E860 Dehydration: Secondary | ICD-10-CM | POA: Diagnosis not present

## 2024-01-06 DIAGNOSIS — M542 Cervicalgia: Secondary | ICD-10-CM | POA: Diagnosis not present

## 2024-01-08 ENCOUNTER — Other Ambulatory Visit: Payer: Self-pay | Admitting: Family Medicine

## 2024-01-08 DIAGNOSIS — E034 Atrophy of thyroid (acquired): Secondary | ICD-10-CM

## 2024-01-10 DIAGNOSIS — C44311 Basal cell carcinoma of skin of nose: Secondary | ICD-10-CM | POA: Diagnosis not present

## 2024-01-10 NOTE — Telephone Encounter (Signed)
 Requested Prescriptions  Pending Prescriptions Disp Refills   lisinopril -hydrochlorothiazide  (ZESTORETIC ) 10-12.5 MG tablet [Pharmacy Med Name: Lisinopril -hydroCHLOROthiazide  10-12.5 MG Oral Tablet] 100 tablet 2    Sig: TAKE 1 TABLET BY MOUTH DAILY     Cardiovascular:  ACEI + Diuretic Combos Failed - 01/10/2024  2:03 PM      Failed - Na in normal range and within 180 days    Sodium  Date Value Ref Range Status  05/08/2023 132 (L) 135 - 145 mmol/L Final  04/20/2017 140 134 - 144 mmol/L Final         Failed - K in normal range and within 180 days    Potassium  Date Value Ref Range Status  05/08/2023 3.5 3.5 - 5.1 mmol/L Final         Failed - Cr in normal range and within 180 days    Creat  Date Value Ref Range Status  09/23/2022 1.04 0.70 - 1.22 mg/dL Final   Creatinine, Ser  Date Value Ref Range Status  05/08/2023 0.78 0.61 - 1.24 mg/dL Final         Failed - eGFR is 30 or above and within 180 days    GFR, Est African American  Date Value Ref Range Status  03/09/2021 98 > OR = 60 mL/min/1.12m2 Final   GFR, Est Non African American  Date Value Ref Range Status  03/09/2021 85 > OR = 60 mL/min/1.13m2 Final   GFR, Estimated  Date Value Ref Range Status  05/08/2023 >60 >60 mL/min Final    Comment:    (NOTE) Calculated using the CKD-EPI Creatinine Equation (2021)    eGFR  Date Value Ref Range Status  09/23/2022 72 > OR = 60 mL/min/1.39m2 Final         Failed - Last BP in normal range    BP Readings from Last 1 Encounters:  12/29/23 (!) 140/90         Passed - Patient is not pregnant      Passed - Valid encounter within last 6 months    Recent Outpatient Visits           1 week ago Bilateral leg weakness   [facility] Raina Bunting, DO   2 months ago Bilateral impacted cerumen   [place] [facility] Raina Bunting, DO   2 months ago Bilateral impacted cerumen   [facility] Raina Bunting, DO       Future Appointments             In 2 months Romeo Co, Kayleen Party, DO [facility]             levothyroxine  (SYNTHROID ) 75 MCG tablet [Pharmacy Med Name: Levothyroxine  Sodium 75 MCG Oral Tablet] 100 tablet 2    Sig: TAKE 1 TABLET BY MOUTH DAILY  BEFORE BREAKFAST     Endocrinology:  Hypothyroid Agents Passed - 01/10/2024  2:03 PM      Passed - TSH in normal range and within 360 days    TSH  Date Value Ref Range Status  05/08/2023 2.394 0.350 - 4.500 uIU/mL Final    Comment:    Performed by a 3rd Generation assay with a functional sensitivity of <=0.01 uIU/mL. Performed at [facility], [address] [place], [place] [address]   09/23/2022 2.78 0.40 - 4.50 mIU/L Final         Passed -  Valid encounter within last 12 months    Recent Outpatient Visits           1 week ago Bilateral leg weakness   [facility], Kayleen Party, DO   2 months ago Bilateral impacted cerumen   [place] [facility] Raina Bunting, DO   2 months ago Bilateral impacted cerumen   [facility] Raina Bunting, DO       Future Appointments             In 2 months Romeo Co, Kayleen Party, DO [place] [facility], [facility]

## 2024-01-12 ENCOUNTER — Ambulatory Visit: Payer: Self-pay

## 2024-01-12 NOTE — Telephone Encounter (Signed)
  Chief Complaint: Thirst/Dehydration Symptoms: thirst, anxiety, legs feel "heavy" Frequency: --- Pertinent Negatives: Patient denies --- Disposition: [x] ED /[] Urgent Care (no appt availability in office) / [] Appointment(In office/virtual)/ []  [place]/ [] Home Care/ [] Refused Recommended Disposition /[] [facility]/ []  Follow-up with PCP Additional Notes: Patient called and states that he is constantly thirsty.  He is unable to quench his thirst.  He states he has had about a quart of water  in the past 3-4 hours. 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 Patient states this area is open and he just has some packing there.  Patient also states that he just got done a while back with chemo and radiation therapy but that had been a while back.  Patient states that he is scared that he is going to die. Patient also states that he feels weak, like his legs are heavy, and he is very scared that he is not going to be able to make it to his appointment tomorrow. This RN offered to call an ambulance for the patient and he advised that he does want an ambulance at this time. This RN called 911 and then connected 911 to the patient. This RN was dropped from the conference call, however this RN called back to 911 to verify that the patient was speaking with them and being taken care of. This RN attempted to call the patient back prior to calling 911 back but the call would not go through to the patient. They advised that they dispatched an ambulance to his address and he is being taken care of at this time.  Patient advised them that he was dehydrated and not urinating. 911 advised that they were taking care of him. Patient was alert & oriented prior to disconnection with this RN but he was very anxious about how he was feeling.     Reason for Disposition   Patient sounds very sick or weak to the triager  Answer Assessment - Initial Assessment Questions 1. DESCRIPTION: "Describe how you are feeling."     Patient states very dehydrated and can't quench his thirst 2. SEVERITY: "How bad is it?"  "Can you stand and walk?"   - MILD (0-3): Feels weak or tired, but does not interfere with work, school or normal activities.   - MODERATE (4-7): Able to stand and walk; weakness interferes with work, school, or normal activities.   - SEVERE (8-10): Unable to stand or walk; unable to do usual activities.     Legs feel heavy 3. ONSET: "When did these symptoms begin?" (e.g., hours, days, weeks, months)     Prior to talking with this RN 4. CAUSE: "What do you think is causing the weakness or fatigue?" (e.g., not drinking enough fluids, medical problem, trouble sleeping)     Unknown---patient states he is drinking a lot of water  5. NEW MEDICINES:  "Have you started on any new medicines recently?" (e.g., opioid pain medicines, benzodiazepines, muscle relaxants, antidepressants, antihistamines, neuroleptics, beta blockers)     ----- 6. OTHER SYMPTOMS: "Do you have any other symptoms?" (e.g., chest pain, fever, cough, SOB, vomiting, diarrhea, bleeding, other areas of pain)     Legs feel "heavy", weakness  Protocols used: Weakness (Generalized) and Fatigue-A-AH

## 2024-01-13 DIAGNOSIS — C44311 Basal cell carcinoma of skin of nose: Secondary | ICD-10-CM | POA: Diagnosis not present

## 2024-01-17 ENCOUNTER — Telehealth: Payer: Self-pay

## 2024-01-17 NOTE — Telephone Encounter (Signed)
 Left message for Joe Hardy on secure VM of all the dx codes and meanings.

## 2024-01-17 NOTE — Telephone Encounter (Signed)
 Copied from CRM [id number]. Topic: General - Other >> Jan 17, 2024  8:43 AM Emylou G wrote: Reason for CRM: Larinda Plover PT w/[facility] rcvd referral... but they need a medical diagnosis that supports the physical therapy for the insurance ( required ) can take a verbal.. call back number is [phone] - okay to leave vmail.

## 2024-01-17 NOTE — Telephone Encounter (Signed)
 Can you notify [facility] about the diagnosis codes? Says verbal or voicemail is okay.  These are the diagnoses and codes attached to the referral order. I am not quite sure why they did not receive them.  Diagnosis  R29.898  - Bilateral leg weakness  R26.81 - Unsteady gait when walking  R29.6  - Recurrent falls  M54.2,G89.29  - Chronic neck pain  M62.838 - Neck muscle spasm   Domingo Friend, DO [facility] 01/17/2024, 11:46 AM

## 2024-01-17 NOTE — Addendum Note (Signed)
 Addended by: Raina Bunting on: 01/17/2024 10:51 AM   Modules accepted: Orders

## 2024-01-18 ENCOUNTER — Telehealth: Payer: Self-pay

## 2024-01-18 DIAGNOSIS — M62838 Other muscle spasm: Secondary | ICD-10-CM | POA: Diagnosis not present

## 2024-01-18 DIAGNOSIS — G8929 Other chronic pain: Secondary | ICD-10-CM | POA: Diagnosis not present

## 2024-01-18 DIAGNOSIS — Z87891 Personal history of nicotine dependence: Secondary | ICD-10-CM | POA: Diagnosis not present

## 2024-01-18 DIAGNOSIS — E860 Dehydration: Secondary | ICD-10-CM | POA: Diagnosis not present

## 2024-01-18 DIAGNOSIS — M6281 Muscle weakness (generalized): Secondary | ICD-10-CM | POA: Diagnosis not present

## 2024-01-18 DIAGNOSIS — R296 Repeated falls: Secondary | ICD-10-CM | POA: Diagnosis not present

## 2024-01-18 DIAGNOSIS — Z9181 History of falling: Secondary | ICD-10-CM | POA: Diagnosis not present

## 2024-01-18 DIAGNOSIS — M542 Cervicalgia: Secondary | ICD-10-CM | POA: Diagnosis not present

## 2024-01-18 NOTE — Telephone Encounter (Signed)
 Copied from CRM [id number]. Topic: General - Other >> Jan 18, 2024  1:43 PM Joe Hardy wrote: Reason for CRM: Pt called would like a call [phone] back regarding his dehydration. Please call pt.

## 2024-01-18 NOTE — Telephone Encounter (Signed)
 Thank you for calling him back. No further recommendations. It sounds like he is improving hydration to address the dehydration issue. We do not do colon cancer screening for patients 80+ especially if there are no bowel symptoms. I don't believe I would make this connection that he is suggesting as the cause of dehydration.  No further call back required unless he has more questions.  Joe Friend, DO [facility] 01/18/2024, 4:43 PM

## 2024-01-22 ENCOUNTER — Other Ambulatory Visit: Payer: Self-pay | Admitting: Family Medicine

## 2024-01-22 DIAGNOSIS — N401 Enlarged prostate with lower urinary tract symptoms: Secondary | ICD-10-CM

## 2024-01-23 ENCOUNTER — Telehealth: Payer: Self-pay

## 2024-01-23 DIAGNOSIS — Z9181 History of falling: Secondary | ICD-10-CM | POA: Diagnosis not present

## 2024-01-23 DIAGNOSIS — M542 Cervicalgia: Secondary | ICD-10-CM | POA: Diagnosis not present

## 2024-01-23 DIAGNOSIS — M62838 Other muscle spasm: Secondary | ICD-10-CM

## 2024-01-23 DIAGNOSIS — G8929 Other chronic pain: Secondary | ICD-10-CM

## 2024-01-23 DIAGNOSIS — R296 Repeated falls: Secondary | ICD-10-CM | POA: Diagnosis not present

## 2024-01-23 DIAGNOSIS — M6281 Muscle weakness (generalized): Secondary | ICD-10-CM | POA: Diagnosis not present

## 2024-01-23 DIAGNOSIS — R519 Headache, unspecified: Secondary | ICD-10-CM

## 2024-01-23 DIAGNOSIS — Z87891 Personal history of nicotine dependence: Secondary | ICD-10-CM | POA: Diagnosis not present

## 2024-01-23 DIAGNOSIS — E860 Dehydration: Secondary | ICD-10-CM | POA: Diagnosis not present

## 2024-01-23 DIAGNOSIS — M4802 Spinal stenosis, cervical region: Secondary | ICD-10-CM

## 2024-01-23 NOTE — Telephone Encounter (Signed)
 Copied from CRM [id number]. Topic: Referral - Request for Referral >> Jan 23, 2024  1:01 PM Joe Hardy wrote: Did the patient discuss referral with their provider in the last year? Yes (If No - schedule appointment) (If Yes - send message)  Appointment offered? Yes  Type of order/referral and detailed reason for visit: Neck pain on right side, headaches  Preference of office, provider, location: Whatever Specialist his provider recommends as they have talked about it extensively at several visits. As long as they take his insurance as well.  If referral order, have you been seen by this specialty before? No   Can we respond through MyChart? No  Please assist patient further

## 2024-01-23 NOTE — Addendum Note (Signed)
 Addended by: Raina Bunting on: 01/23/2024 05:33 PM   Modules accepted: Orders

## 2024-01-23 NOTE — Telephone Encounter (Signed)
 Referral sent to [facility] for neck pain.  One of these locations will contact him with scheduling.  [facility] [address] [place], [place] [address] Office: [phone]  [facility] [address] [place], [place] [address] Office: [phone]

## 2024-01-24 ENCOUNTER — Telehealth: Payer: Self-pay

## 2024-01-24 NOTE — Telephone Encounter (Signed)
 Copied from CRM [id number]. Topic: General - Other >> Jan 24, 2024 10:27 AM Essie A wrote: Reason for CRM: Patient called back.  Message was given regarding his referral.

## 2024-01-24 NOTE — Telephone Encounter (Signed)
 Reason for CRM: Patient called back.  Message was given regarding his referral

## 2024-01-24 NOTE — Telephone Encounter (Signed)
 Left message for patient to return call OK to advise

## 2024-01-24 NOTE — Telephone Encounter (Signed)
 Requested Prescriptions  Pending Prescriptions Disp Refills   terazosin  (HYTRIN ) 5 MG capsule [Pharmacy Med Name: Terazosin  HCl 5 MG Oral Capsule] 100 capsule 2    Sig: TAKE 1 CAPSULE BY MOUTH DAILY     Cardiovascular:  Alpha Blockers Failed - 01/24/2024  2:23 PM      Failed - Last BP in normal range    BP Readings from Last 1 Encounters:  12/29/23 (!) 140/90         Passed - Valid encounter within last 6 months    Recent Outpatient Visits           3 weeks ago Bilateral leg weakness   [facility] Raina Bunting, DO   2 months ago Bilateral impacted cerumen   [facility] Raina Bunting, DO   2 months ago Bilateral impacted cerumen   [facility] [facility] Raina Bunting, DO       Future Appointments             In 1 month Romeo Co, Kayleen Party, DO [facility], [facility]

## 2024-01-27 DIAGNOSIS — M542 Cervicalgia: Secondary | ICD-10-CM | POA: Diagnosis not present

## 2024-01-27 DIAGNOSIS — Z9181 History of falling: Secondary | ICD-10-CM | POA: Diagnosis not present

## 2024-01-27 DIAGNOSIS — M62838 Other muscle spasm: Secondary | ICD-10-CM | POA: Diagnosis not present

## 2024-01-27 DIAGNOSIS — M6281 Muscle weakness (generalized): Secondary | ICD-10-CM | POA: Diagnosis not present

## 2024-01-27 DIAGNOSIS — G8929 Other chronic pain: Secondary | ICD-10-CM | POA: Diagnosis not present

## 2024-01-27 DIAGNOSIS — Z87891 Personal history of nicotine dependence: Secondary | ICD-10-CM | POA: Diagnosis not present

## 2024-01-27 DIAGNOSIS — R296 Repeated falls: Secondary | ICD-10-CM | POA: Diagnosis not present

## 2024-01-27 DIAGNOSIS — E860 Dehydration: Secondary | ICD-10-CM | POA: Diagnosis not present

## 2024-01-31 DIAGNOSIS — M542 Cervicalgia: Secondary | ICD-10-CM | POA: Diagnosis not present

## 2024-01-31 DIAGNOSIS — E860 Dehydration: Secondary | ICD-10-CM | POA: Diagnosis not present

## 2024-01-31 DIAGNOSIS — Z9181 History of falling: Secondary | ICD-10-CM | POA: Diagnosis not present

## 2024-01-31 DIAGNOSIS — G8929 Other chronic pain: Secondary | ICD-10-CM | POA: Diagnosis not present

## 2024-01-31 DIAGNOSIS — Z87891 Personal history of nicotine dependence: Secondary | ICD-10-CM | POA: Diagnosis not present

## 2024-01-31 DIAGNOSIS — M62838 Other muscle spasm: Secondary | ICD-10-CM | POA: Diagnosis not present

## 2024-01-31 DIAGNOSIS — R296 Repeated falls: Secondary | ICD-10-CM | POA: Diagnosis not present

## 2024-01-31 DIAGNOSIS — M6281 Muscle weakness (generalized): Secondary | ICD-10-CM | POA: Diagnosis not present

## 2024-02-03 DIAGNOSIS — G8929 Other chronic pain: Secondary | ICD-10-CM | POA: Diagnosis not present

## 2024-02-03 DIAGNOSIS — M6281 Muscle weakness (generalized): Secondary | ICD-10-CM | POA: Diagnosis not present

## 2024-02-03 DIAGNOSIS — Z9181 History of falling: Secondary | ICD-10-CM | POA: Diagnosis not present

## 2024-02-03 DIAGNOSIS — M542 Cervicalgia: Secondary | ICD-10-CM | POA: Diagnosis not present

## 2024-02-03 DIAGNOSIS — E860 Dehydration: Secondary | ICD-10-CM | POA: Diagnosis not present

## 2024-02-03 DIAGNOSIS — M62838 Other muscle spasm: Secondary | ICD-10-CM | POA: Diagnosis not present

## 2024-02-03 DIAGNOSIS — Z87891 Personal history of nicotine dependence: Secondary | ICD-10-CM | POA: Diagnosis not present

## 2024-02-03 DIAGNOSIS — R296 Repeated falls: Secondary | ICD-10-CM | POA: Diagnosis not present

## 2024-02-07 DIAGNOSIS — M542 Cervicalgia: Secondary | ICD-10-CM | POA: Diagnosis not present

## 2024-02-07 DIAGNOSIS — M62838 Other muscle spasm: Secondary | ICD-10-CM | POA: Diagnosis not present

## 2024-02-07 DIAGNOSIS — M6281 Muscle weakness (generalized): Secondary | ICD-10-CM | POA: Diagnosis not present

## 2024-02-07 DIAGNOSIS — E860 Dehydration: Secondary | ICD-10-CM | POA: Diagnosis not present

## 2024-02-07 DIAGNOSIS — Z9181 History of falling: Secondary | ICD-10-CM | POA: Diagnosis not present

## 2024-02-07 DIAGNOSIS — Z87891 Personal history of nicotine dependence: Secondary | ICD-10-CM | POA: Diagnosis not present

## 2024-02-07 DIAGNOSIS — R296 Repeated falls: Secondary | ICD-10-CM | POA: Diagnosis not present

## 2024-02-07 DIAGNOSIS — G8929 Other chronic pain: Secondary | ICD-10-CM | POA: Diagnosis not present

## 2024-02-09 ENCOUNTER — Telehealth: Payer: Self-pay

## 2024-02-09 NOTE — Telephone Encounter (Signed)
 Patient notified to do home exercises and we will discuss therapy at his next appointment

## 2024-02-09 NOTE — Telephone Encounter (Signed)
 Copied from CRM [id number]. Topic: Referral - Question >> Feb 09, 2024  1:06 PM Phil Braun wrote: Reason for CRM:   Pt called and said that he is getting physical therapy through [facility] and he has dismissed him. He said that the therapist was overly aggressive and it took a toll on him and its just not going to work out. To over bearing.   Pt would love to have physical therapy but not from [facility] or  the gentleman from this company.

## 2024-02-09 NOTE — Telephone Encounter (Signed)
 Copied from CRM [id number]. Topic: General - Other >> Feb 09, 2024  1:22 PM Alysia Jumbo S wrote: Reason for CRM: Christ, PT with [facility], states that patient is refusing physical therapy. States, "Patient has become aggressive and is unwilling to cooperate with PT. Patient does not want to be asked about medications and not taking medications as prescribed. Patient was offered to see a different therapist, but refused." Callback [phone]

## 2024-02-09 NOTE — Telephone Encounter (Signed)
 Okay, sounds like based on situation patient is requesting to discharge or stop HH PT with [facility]. I don't believe we need any other orders for them at this time. If it is a discontinuation of service.  Domingo Friend, DO [facility] 02/09/2024, 2:01 PM

## 2024-02-09 NOTE — Telephone Encounter (Signed)
 I would suggest that most Home Health PT can be fairly aggressive to help rehab the patient. My suggestion is that he continue home exercises on his own for next 1 month, and then when I see him back on 6/30 at his next apt, we can make a decision on referring to another Home Health PT or other plan.  Domingo Friend, DO [facility] 02/09/2024, 2:02 PM

## 2024-02-09 NOTE — Telephone Encounter (Signed)
 Christ notified to d/c therapy at this time

## 2024-02-21 DIAGNOSIS — M542 Cervicalgia: Secondary | ICD-10-CM | POA: Diagnosis not present

## 2024-02-21 DIAGNOSIS — M47812 Spondylosis without myelopathy or radiculopathy, cervical region: Secondary | ICD-10-CM | POA: Diagnosis not present

## 2024-02-22 ENCOUNTER — Ambulatory Visit (INDEPENDENT_AMBULATORY_CARE_PROVIDER_SITE_OTHER): Admitting: Urology

## 2024-02-22 VITALS — BP 160/83 | HR 73 | Ht 69.0 in | Wt 182.0 lb

## 2024-02-22 DIAGNOSIS — Z8551 Personal history of malignant neoplasm of bladder: Secondary | ICD-10-CM | POA: Diagnosis not present

## 2024-02-22 DIAGNOSIS — C689 Malignant neoplasm of urinary organ, unspecified: Secondary | ICD-10-CM

## 2024-02-22 MED ORDER — LIDOCAINE HCL URETHRAL/MUCOSAL 2 % EX GEL
1.0000 | Freq: Once | CUTANEOUS | Status: AC
Start: 2024-02-22 — End: 2024-02-22
  Administered 2024-02-22: 1 via URETHRAL

## 2024-02-22 MED ORDER — SULFAMETHOXAZOLE-TRIMETHOPRIM 800-160 MG PO TABS
1.0000 | ORAL_TABLET | Freq: Once | ORAL | Status: AC
  Administered 2024-02-22: 1 via ORAL

## 2024-02-22 NOTE — Progress Notes (Signed)
 Cystoscopy Procedure Note:  Indication:  83 year old male who originally presented with Foley dependent urinary retention, failed a voiding trial, CT showed 100 g prostate, PSA was normal, and he opted for HOLEP.  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  He was managed at [facility] with trimodal therapy with chemotherapy and radiation, PET scan showed no evidence of metastatic disease, most recently CT in March 2025 showed no evidence of recurrence.  After informed consent and discussion of the procedure and its risks, Joe Hardy was positioned and prepped in the standard fashion. Cystoscopy was performed with a flexible cystoscope.  Challenging to enter the bladder based on calcifications at the bladder neck.  From the bladder neck there was no obvious evidence of recurrence within the bladder and cytology was sent.  Findings: No evidence of recurrence, small stones near the bladder neck asymptomatic and he defers treatment at this time  Assessment and Plan: Follow-up cytology No evidence of recurrence, continue cystoscopy every 3 to 4 months, will follow-up cross-sectional imaging from [facility], [place] 02/22/2024

## 2024-02-24 ENCOUNTER — Telehealth: Payer: Self-pay

## 2024-02-24 NOTE — Telephone Encounter (Signed)
 Dianon called the triage line, started this pt's urine cup was crush when it go to them. Unable to run cytology, states they will send a report informing of us 

## 2024-02-24 NOTE — Telephone Encounter (Signed)
 Do want patient to come back in for repeat? Please advise.

## 2024-03-06 DIAGNOSIS — M47812 Spondylosis without myelopathy or radiculopathy, cervical region: Secondary | ICD-10-CM | POA: Diagnosis not present

## 2024-03-06 DIAGNOSIS — M5412 Radiculopathy, cervical region: Secondary | ICD-10-CM | POA: Diagnosis not present

## 2024-03-07 ENCOUNTER — Ambulatory Visit: Payer: Self-pay | Admitting: Family Medicine

## 2024-03-07 DIAGNOSIS — N3289 Other specified disorders of bladder: Secondary | ICD-10-CM | POA: Diagnosis not present

## 2024-03-07 DIAGNOSIS — M542 Cervicalgia: Secondary | ICD-10-CM | POA: Diagnosis not present

## 2024-03-07 DIAGNOSIS — C679 Malignant neoplasm of bladder, unspecified: Secondary | ICD-10-CM | POA: Diagnosis not present

## 2024-03-07 DIAGNOSIS — I10 Essential (primary) hypertension: Secondary | ICD-10-CM | POA: Diagnosis not present

## 2024-03-07 DIAGNOSIS — Z87891 Personal history of nicotine dependence: Secondary | ICD-10-CM | POA: Diagnosis not present

## 2024-03-07 DIAGNOSIS — C689 Malignant neoplasm of urinary organ, unspecified: Secondary | ICD-10-CM | POA: Diagnosis not present

## 2024-03-07 DIAGNOSIS — R911 Solitary pulmonary nodule: Secondary | ICD-10-CM | POA: Diagnosis not present

## 2024-03-07 DIAGNOSIS — E039 Hypothyroidism, unspecified: Secondary | ICD-10-CM | POA: Diagnosis not present

## 2024-03-07 DIAGNOSIS — C44301 Unspecified malignant neoplasm of skin of nose: Secondary | ICD-10-CM | POA: Diagnosis not present

## 2024-03-07 DIAGNOSIS — C673 Malignant neoplasm of anterior wall of bladder: Secondary | ICD-10-CM | POA: Diagnosis not present

## 2024-03-12 ENCOUNTER — Ambulatory Visit: Admitting: Family Medicine

## 2024-03-12 ENCOUNTER — Telehealth: Payer: Self-pay

## 2024-03-12 NOTE — Telephone Encounter (Signed)
 Copied from CRM [id number]. Topic: Clinical - Medical Advice >> Mar 12, 2024  8:22 AM Ernestene SQUIBB wrote: Reason for CRM: pt having bowel movements - little dark brown balls, was concern  - pt would like to be contacted [phone]

## 2024-03-19 ENCOUNTER — Ambulatory Visit (INDEPENDENT_AMBULATORY_CARE_PROVIDER_SITE_OTHER): Admitting: Family Medicine

## 2024-03-19 ENCOUNTER — Encounter: Payer: Self-pay | Admitting: Family Medicine

## 2024-03-19 VITALS — BP 138/80 | HR 97 | Ht 69.0 in | Wt 179.0 lb

## 2024-03-19 DIAGNOSIS — K5900 Constipation, unspecified: Secondary | ICD-10-CM

## 2024-03-19 DIAGNOSIS — M542 Cervicalgia: Secondary | ICD-10-CM | POA: Diagnosis not present

## 2024-03-19 DIAGNOSIS — M62838 Other muscle spasm: Secondary | ICD-10-CM | POA: Diagnosis not present

## 2024-03-19 DIAGNOSIS — G8929 Other chronic pain: Secondary | ICD-10-CM

## 2024-03-19 DIAGNOSIS — M4802 Spinal stenosis, cervical region: Secondary | ICD-10-CM | POA: Diagnosis not present
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
## 2024-03-19 NOTE — Patient Instructions (Addendum)
 Thank you for coming to the office today.  Keep up with [facility] as scheduled for Injections and Physical Therapy plan  -----------------  For Constipation (less frequent bowel movement that can be hard dry or involve straining).  Recommend trying OTC Miralax  17g = 1 capful in large glass water  once daily for now, try several days to see if working, goal is soft stool or BM 1-2 times daily, if too loose then reduce dose or try every other day. If not effective may need to increase it to 2 doses at once in AM or may do 1 in morning and 1 in afternoon/evening  - This medicine is very safe and can be used often without any problem and will not make you dehydrated. It is good for use on AS NEEDED BASIS or even MAINTENANCE therapy for longer term for several days to weeks at a time to help regulate bowel movements  Other more natural remedies or preventative treatment: - Increase hydration with water  - Increase fiber in diet (high fiber foods = vegetables, leafy greens, oats/grains) - May take OTC Fiber supplement (metamucil powder or pill/gummy) - May try OTC Probiotic   Please schedule a Follow-up Appointment to: Return in about 4 months (around 07/20/2024) for 4 month follow-up.  If you have any other questions or concerns, please feel free to call the office or send a message through MyChart. You may also schedule an earlier appointment if necessary.  Additionally, you may be receiving a survey about your experience at our office within a few days to 1 week by e-mail or mail. We value your feedback.  Marsa Officer, DO [facility], [place]
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
## 2024-03-28 DIAGNOSIS — M47812 Spondylosis without myelopathy or radiculopathy, cervical region: Secondary | ICD-10-CM | POA: Diagnosis not present

## 2024-04-03 DIAGNOSIS — M47812 Spondylosis without myelopathy or radiculopathy, cervical region: Secondary | ICD-10-CM | POA: Diagnosis not present

## 2024-04-04 DIAGNOSIS — M47812 Spondylosis without myelopathy or radiculopathy, cervical region: Secondary | ICD-10-CM | POA: Diagnosis not present

## 2024-04-04 DIAGNOSIS — M25561 Pain in right knee: Secondary | ICD-10-CM | POA: Diagnosis not present

## 2024-04-12 DIAGNOSIS — M47812 Spondylosis without myelopathy or radiculopathy, cervical region: Secondary | ICD-10-CM | POA: Diagnosis not present

## 2024-04-19 DIAGNOSIS — M47812 Spondylosis without myelopathy or radiculopathy, cervical region: Secondary | ICD-10-CM | POA: Diagnosis not present

## 2024-04-24 DIAGNOSIS — L570 Actinic keratosis: Secondary | ICD-10-CM | POA: Diagnosis not present

## 2024-04-24 DIAGNOSIS — Z872 Personal history of diseases of the skin and subcutaneous tissue: Secondary | ICD-10-CM | POA: Diagnosis not present

## 2024-04-24 DIAGNOSIS — L578 Other skin changes due to chronic exposure to nonionizing radiation: Secondary | ICD-10-CM | POA: Diagnosis not present

## 2024-04-24 DIAGNOSIS — C44629 Squamous cell carcinoma of skin of left upper limb, including shoulder: Secondary | ICD-10-CM | POA: Diagnosis not present

## 2024-04-24 DIAGNOSIS — Z859 Personal history of malignant neoplasm, unspecified: Secondary | ICD-10-CM | POA: Diagnosis not present

## 2024-04-24 DIAGNOSIS — D485 Neoplasm of uncertain behavior of skin: Secondary | ICD-10-CM | POA: Diagnosis not present

## 2024-04-24 DIAGNOSIS — Z85828 Personal history of other malignant neoplasm of skin: Secondary | ICD-10-CM | POA: Diagnosis not present

## 2024-04-25 DIAGNOSIS — M47812 Spondylosis without myelopathy or radiculopathy, cervical region: Secondary | ICD-10-CM | POA: Diagnosis not present

## 2024-04-26 DIAGNOSIS — M1711 Unilateral primary osteoarthritis, right knee: Secondary | ICD-10-CM | POA: Diagnosis not present

## 2024-05-01 DIAGNOSIS — M47812 Spondylosis without myelopathy or radiculopathy, cervical region: Secondary | ICD-10-CM | POA: Diagnosis not present

## 2024-05-09 DIAGNOSIS — M47812 Spondylosis without myelopathy or radiculopathy, cervical region: Secondary | ICD-10-CM | POA: Diagnosis not present

## 2024-05-11 DIAGNOSIS — M47812 Spondylosis without myelopathy or radiculopathy, cervical region: Secondary | ICD-10-CM | POA: Diagnosis not present

## 2024-05-17 DIAGNOSIS — M47812 Spondylosis without myelopathy or radiculopathy, cervical region: Secondary | ICD-10-CM | POA: Diagnosis not present

## 2024-05-21 ENCOUNTER — Other Ambulatory Visit: Payer: Self-pay | Admitting: Family Medicine

## 2024-05-21 DIAGNOSIS — M62838 Other muscle spasm: Secondary | ICD-10-CM

## 2024-05-21 DIAGNOSIS — G8929 Other chronic pain: Secondary | ICD-10-CM

## 2024-05-21 MED ORDER — BACLOFEN 10 MG PO TABS: 5.0000 mg | ORAL_TABLET | Freq: Two times a day (BID) | ORAL | 1 refills | Status: AC | PRN

## 2024-05-21 MED ORDER — CYCLOBENZAPRINE HCL 10 MG PO TABS: 10.0000 mg | ORAL_TABLET | Freq: Two times a day (BID) | ORAL | 1 refills | Status: AC | PRN

## 2024-05-21 NOTE — Telephone Encounter (Signed)
 Copied from CRM [id number]. Topic: Clinical - Medication Refill >> May 21, 2024 11:35 AM Fonda T wrote: Medication: baclofen  (LIORESAL ) 10 MG tablet  cyclobenzaprine  (FLEXERIL ) 10 MG tablet   Has the patient contacted their pharmacy? Yes, per pharmacy advised patient needed more refills on medications   This is the patient's preferred pharmacy:  [facility] - [place], [place] - [address] [place]  [id number] Phone: [phone] Fax: [phone]    Is this the correct pharmacy for this prescription? Yes If no, delete pharmacy and type the correct one.   Has the prescription been filled recently? Yes  Is the patient out of the medication? No, has some left, but gets medications via mail order, and wants to make sure does not run out  Has the patient been seen for an appointment in the last year OR does the patient have an upcoming appointment? Yes  Can we respond through MyChart? No, prefers phone call [phone]  Agent: Please be advised that Rx refills may take up to 3 business days. We ask that you follow-up with your pharmacy.

## 2024-05-24 ENCOUNTER — Telehealth: Payer: Self-pay

## 2024-05-24 DIAGNOSIS — Z9189 Other specified personal risk factors, not elsewhere classified: Secondary | ICD-10-CM

## 2024-05-24 MED ORDER — COVID-19 MRNA VAC-TRIS(PFIZER) 30 MCG/0.3ML IM SUSY: 0.3000 mL | PREFILLED_SYRINGE | Freq: Once | INTRAMUSCULAR | 0 refills | Status: AC

## 2024-05-24 NOTE — Telephone Encounter (Signed)
 Copied from CRM #[id number]. Topic: Clinical - Medication Question >> May 24, 2024  1:36 PM Debby BROCKS wrote: Reason for CRM: Patient would like a Covid Prescription to be able to take the vaccine at his [facility] pharmacy

## 2024-05-24 NOTE — Addendum Note (Signed)
 Addended by: EDMAN MARSA PARAS on: 05/24/2024 01:43 PM   Modules accepted: Orders

## 2024-05-24 NOTE — Telephone Encounter (Signed)
 Patient notified prescription sent to pharmacy.

## 2024-05-24 NOTE — Telephone Encounter (Signed)
 Please let patient know. COVID Vaccine sent to pharmacy  Marsa Officer, DO [facility] 05/24/2024, 1:43 PM

## 2024-05-29 DIAGNOSIS — C44629 Squamous cell carcinoma of skin of left upper limb, including shoulder: Secondary | ICD-10-CM | POA: Diagnosis not present

## 2024-06-05 DIAGNOSIS — M25561 Pain in right knee: Secondary | ICD-10-CM | POA: Diagnosis not present

## 2024-06-05 DIAGNOSIS — M47812 Spondylosis without myelopathy or radiculopathy, cervical region: Secondary | ICD-10-CM | POA: Diagnosis not present

## 2024-06-13 DIAGNOSIS — L821 Other seborrheic keratosis: Secondary | ICD-10-CM | POA: Diagnosis not present

## 2024-06-13 DIAGNOSIS — L570 Actinic keratosis: Secondary | ICD-10-CM | POA: Diagnosis not present

## 2024-06-19 DIAGNOSIS — C44311 Basal cell carcinoma of skin of nose: Secondary | ICD-10-CM | POA: Diagnosis not present

## 2024-06-21 ENCOUNTER — Other Ambulatory Visit: Payer: Self-pay | Admitting: Family Medicine

## 2024-06-21 DIAGNOSIS — M4802 Spinal stenosis, cervical region: Secondary | ICD-10-CM

## 2024-06-21 DIAGNOSIS — M5412 Radiculopathy, cervical region: Secondary | ICD-10-CM

## 2024-06-24 ENCOUNTER — Other Ambulatory Visit: Payer: Self-pay | Admitting: Family Medicine

## 2024-06-24 DIAGNOSIS — E034 Atrophy of thyroid (acquired): Secondary | ICD-10-CM

## 2024-06-26 ENCOUNTER — Ambulatory Visit (INDEPENDENT_AMBULATORY_CARE_PROVIDER_SITE_OTHER): Admitting: Urology

## 2024-06-26 ENCOUNTER — Other Ambulatory Visit
Admission: RE | Admit: 2024-06-26 | Discharge: 2024-06-26 | Disposition: A | Discharge status: Home / Self Care | Attending: Urology | Admitting: Urology

## 2024-06-26 VITALS — BP 134/78 | HR 110

## 2024-06-26 DIAGNOSIS — Z8551 Personal history of malignant neoplasm of bladder: Secondary | ICD-10-CM | POA: Diagnosis not present

## 2024-06-26 DIAGNOSIS — R351 Nocturia: Secondary | ICD-10-CM | POA: Insufficient documentation

## 2024-06-26 DIAGNOSIS — R350 Frequency of micturition: Secondary | ICD-10-CM | POA: Diagnosis not present

## 2024-06-26 MED ORDER — LIDOCAINE HCL URETHRAL/MUCOSAL 2 % EX GEL
1.0000 | Freq: Once | CUTANEOUS | Status: AC
  Administered 2024-06-26: 1 via URETHRAL

## 2024-06-26 MED ORDER — SULFAMETHOXAZOLE-TRIMETHOPRIM 800-160 MG PO TABS
1.0000 | ORAL_TABLET | Freq: Once | ORAL | Status: AC
  Administered 2024-06-26: 1 via ORAL

## 2024-06-26 NOTE — Progress Notes (Signed)
 Cystoscopy Procedure Note:  Indication:  83 year old male who originally presented with Foley dependent urinary retention, failed a voiding trial, CT showed 100 g prostate, PSA was normal, and he opted for HOLEP.  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  He was managed at [facility] with trimodal therapy with chemotherapy and radiation, PET scan showed no evidence of metastatic disease, most recently CT in June 2025 showed no evidence of recurrence.  Bactrim  given for prophylaxis  After informed consent and discussion of the procedure and its risks, Joe Hardy was positioned and prepped in the standard fashion. Cystoscopy was performed with a flexible cystoscope.  Small calcifications at bladder neck.  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  Cytology sent.  Findings: No evidence of recurrence, small stones near the bladder neck asymptomatic and he defers treatment at this time  Assessment and Plan: Follow-up cytology and urine culture No evidence of recurrence, continue cystoscopy every 3 to 4 months, will follow-up cross-sectional imaging from [facility] Denies any urinary symptoms today   Redell Burnet, MD 06/26/2024

## 2024-06-26 NOTE — Telephone Encounter (Signed)
 Requested Prescriptions  Pending Prescriptions Disp Refills   levothyroxine  (SYNTHROID ) 75 MCG tablet [Pharmacy Med Name: Levothyroxine  Sodium 75 MCG Oral Tablet] 100 tablet 0    Sig: TAKE 1 TABLET BY MOUTH DAILY  BEFORE BREAKFAST     Endocrinology:  Hypothyroid Agents Failed - 06/26/2024  3:34 PM      Failed - TSH in normal range and within 360 days    TSH  Date Value Ref Range Status  05/08/2023 2.394 0.350 - 4.500 uIU/mL Final    Comment:    Performed by a 3rd Generation assay with a functional sensitivity of <=0.01 uIU/mL. Performed at [facility], [address] [place], [place] [address]   09/23/2022 2.78 0.40 - 4.50 mIU/L Final         Passed - Valid encounter within last 12 months    Recent Outpatient Visits           3 months ago Constipation, unspecified constipation type   [facility] [place], Marsa PARAS, DO   6 months ago Bilateral leg weakness   [facility] [place], Marsa PARAS, DO   7 months ago Bilateral impacted cerumen   [facility] [place] Marsa PARAS, DO   7 months ago Bilateral impacted cerumen   [facility] [place], Marsa PARAS, DO               lisinopril -hydrochlorothiazide  (ZESTORETIC ) 10-12.5 MG tablet [Pharmacy Med Name: Lisinopril -hydroCHLOROthiazide  10-12.5 MG Oral Tablet] 100 tablet 0    Sig: TAKE 1 TABLET BY MOUTH DAILY     Cardiovascular:  ACEI + Diuretic Combos Failed - 06/26/2024  3:34 PM      Failed - Na in normal range and within 180 days    Sodium  Date Value Ref Range Status  05/08/2023 132 (L) 135 - 145 mmol/L Final  04/20/2017 140 134 - 144 mmol/L Final         Failed - K in normal range and within 180 days    Potassium  Date Value Ref Range Status  05/08/2023 3.5 3.5 - 5.1 mmol/L Final         Failed - Cr in normal range and within 180 days    Creat  Date Value Ref Range Status   09/23/2022 1.04 0.70 - 1.22 mg/dL Final   Creatinine, Ser  Date Value Ref Range Status  05/08/2023 0.78 0.61 - 1.24 mg/dL Final         Failed - eGFR is 30 or above and within 180 days    GFR, Est African American  Date Value Ref Range Status  03/09/2021 98 > OR = 60 mL/min/1.19m2 Final   GFR, Est Non African American  Date Value Ref Range Status  03/09/2021 85 > OR = 60 mL/min/1.16m2 Final   GFR, Estimated  Date Value Ref Range Status  05/08/2023 >60 >60 mL/min Final    Comment:    (NOTE) Calculated using the CKD-EPI Creatinine Equation (2021)    eGFR  Date Value Ref Range Status  09/23/2022 72 > OR = 60 mL/min/1.44m2 Final         Passed - Patient is not pregnant      Passed - Last BP in normal range    BP Readings from Last 1 Encounters:  06/26/24 134/78         Passed - Valid encounter within last 6 months    Recent Outpatient Visits  3 months ago Constipation, unspecified constipation type   [facility] [place] Marsa PARAS, DO   6 months ago Bilateral leg weakness   [facility] [place] Marsa PARAS, DO   7 months ago Bilateral impacted cerumen   [facility] [place] Marsa PARAS, DO   7 months ago Bilateral impacted cerumen   [facility] [place], Marsa PARAS, [place]

## 2024-06-27 ENCOUNTER — Other Ambulatory Visit: Payer: Self-pay

## 2024-06-27 DIAGNOSIS — S0120XA Unspecified open wound of nose, initial encounter: Secondary | ICD-10-CM | POA: Diagnosis not present

## 2024-06-27 DIAGNOSIS — L928 Other granulomatous disorders of the skin and subcutaneous tissue: Secondary | ICD-10-CM | POA: Diagnosis not present

## 2024-06-27 DIAGNOSIS — Z48817 Encounter for surgical aftercare following surgery on the skin and subcutaneous tissue: Secondary | ICD-10-CM | POA: Diagnosis not present

## 2024-06-28 ENCOUNTER — Ambulatory Visit: Payer: Self-pay | Admitting: Urology

## 2024-06-28 DIAGNOSIS — B952 Enterococcus as the cause of diseases classified elsewhere: Secondary | ICD-10-CM

## 2024-06-28 LAB — URINE CULTURE: Culture: 70000 — AB

## 2024-06-28 MED ORDER — AMOXICILLIN 500 MG PO TABS: 500.0000 mg | ORAL_TABLET | Freq: Two times a day (BID) | ORAL | 0 refills | Status: AC

## 2024-06-28 NOTE — Telephone Encounter (Signed)
Called pt informed him of the information below. Pt voiced understanding. RX sent in.  ?

## 2024-06-28 NOTE — Telephone Encounter (Signed)
-----   Message from Redell JAYSON Burnet sent at 06/28/2024 11:02 AM EDT ----- His urine did grow out some bacteria, would recommend amoxicillin 500 mg twice daily x 10 days, This may help with his frequency  Redell Burnet, MD 06/28/2024  ----- Message ----- From: Interface, [facility] [place] Sent: 06/27/2024   9:56 AM EDT To: Redell JAYSON Burnet, MD

## 2024-07-03 ENCOUNTER — Ambulatory Visit
Admission: RE | Admit: 2024-07-03 | Discharge: 2024-07-03 | Disposition: A | Discharge status: Home / Self Care | Source: Ambulatory Visit | Attending: Family Medicine | Admitting: Family Medicine

## 2024-07-03 DIAGNOSIS — M5020 Other cervical disc displacement, unspecified cervical region: Secondary | ICD-10-CM | POA: Diagnosis not present

## 2024-07-03 DIAGNOSIS — M5412 Radiculopathy, cervical region: Secondary | ICD-10-CM

## 2024-07-03 DIAGNOSIS — M4802 Spinal stenosis, cervical region: Secondary | ICD-10-CM | POA: Diagnosis not present

## 2024-07-03 DIAGNOSIS — M47812 Spondylosis without myelopathy or radiculopathy, cervical region: Secondary | ICD-10-CM | POA: Diagnosis not present

## 2024-07-03 LAB — CYTOLOGY PLUS MONITORING PROFILE: PAP & FEULGEN

## 2024-07-06 ENCOUNTER — Ambulatory Visit
Admission: RE | Admit: 2024-07-06 | Discharge: 2024-07-06 | Disposition: A | Discharge status: Home / Self Care | Source: Ambulatory Visit | Attending: Physical Medicine and Rehabilitation | Admitting: Physical Medicine and Rehabilitation

## 2024-07-06 ENCOUNTER — Telehealth: Payer: Self-pay | Admitting: Neurosurgery

## 2024-07-06 ENCOUNTER — Other Ambulatory Visit: Payer: Self-pay | Admitting: Physical Medicine and Rehabilitation

## 2024-07-06 DIAGNOSIS — M542 Cervicalgia: Secondary | ICD-10-CM | POA: Diagnosis not present

## 2024-07-06 NOTE — Telephone Encounter (Signed)
 Dr Claudene,  His CT has resulted and has different findings than his MRI  FINDINGS:   BONES AND ALIGNMENT: No acute fracture or traumatic malalignment.   DEGENERATIVE CHANGES: No significant degenerative changes.   SOFT TISSUES: No prevertebral soft tissue swelling.   IMPRESSION: 1. No acute abnormality of the cervical spine.

## 2024-07-06 NOTE — Telephone Encounter (Signed)
 Dr Claudene reviewed this referral from Dr Avanell and agreed that it is OK to wait until 11/11 and add to wait list. 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

## 2024-07-06 NOTE — Telephone Encounter (Signed)
Left message for patient to call our office back to schedule.

## 2024-07-06 NOTE — Telephone Encounter (Signed)
 Per secure chat, patient needs to be scheduled to see Dr. Clois for a C2 fracture. His first new patient appointment is 11/11 and can be added to a wait list per Kendelyn.

## 2024-07-09 ENCOUNTER — Other Ambulatory Visit: Payer: Self-pay

## 2024-07-09 ENCOUNTER — Inpatient Hospital Stay
Admission: RE | Admit: 2024-07-09 | Discharge: 2024-07-09 | Disposition: A | Discharge status: Home / Self Care | Payer: Self-pay | Source: Ambulatory Visit | Attending: Neurosurgery | Admitting: Neurosurgery

## 2024-07-09 DIAGNOSIS — Z049 Encounter for examination and observation for unspecified reason: Secondary | ICD-10-CM

## 2024-07-09 NOTE — Telephone Encounter (Signed)
 Ok to schedule a new patient appointment with Dr Clois per discussion with Dr Claudene. Caitlin left a voicemail for the patient on Friday.

## 2024-07-09 NOTE — Telephone Encounter (Signed)
 Xray from [facility] has  also been loaded to his chart.

## 2024-07-11 DIAGNOSIS — R918 Other nonspecific abnormal finding of lung field: Secondary | ICD-10-CM | POA: Diagnosis not present

## 2024-07-11 DIAGNOSIS — R935 Abnormal findings on diagnostic imaging of other abdominal regions, including retroperitoneum: Secondary | ICD-10-CM | POA: Diagnosis not present

## 2024-07-11 DIAGNOSIS — C679 Malignant neoplasm of bladder, unspecified: Secondary | ICD-10-CM | POA: Diagnosis not present

## 2024-07-11 DIAGNOSIS — R911 Solitary pulmonary nodule: Secondary | ICD-10-CM | POA: Diagnosis not present

## 2024-07-11 DIAGNOSIS — C689 Malignant neoplasm of urinary organ, unspecified: Secondary | ICD-10-CM | POA: Diagnosis not present

## 2024-07-11 NOTE — Telephone Encounter (Signed)
 Patient scheduled for 11/11 and added to the the wait list.

## 2024-07-17 ENCOUNTER — Encounter: Payer: Self-pay | Admitting: Neurosurgery

## 2024-07-17 ENCOUNTER — Ambulatory Visit: Admitting: Neurosurgery

## 2024-07-17 VITALS — BP 166/112 | Ht 69.0 in | Wt 175.0 lb

## 2024-07-17 DIAGNOSIS — M542 Cervicalgia: Secondary | ICD-10-CM

## 2024-07-17 DIAGNOSIS — M438X1 Other specified deforming dorsopathies, occipito-atlanto-axial region: Secondary | ICD-10-CM

## 2024-07-17 DIAGNOSIS — M532X2 Spinal instabilities, cervical region: Secondary | ICD-10-CM | POA: Diagnosis not present
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
## 2024-07-24 ENCOUNTER — Ambulatory Visit: Admitting: Neurosurgery

## 2024-07-25 ENCOUNTER — Ambulatory Visit (INDEPENDENT_AMBULATORY_CARE_PROVIDER_SITE_OTHER): Admitting: Family Medicine

## 2024-07-25 ENCOUNTER — Telehealth: Payer: Self-pay | Admitting: Neurosurgery

## 2024-07-25 ENCOUNTER — Encounter: Payer: Self-pay | Admitting: Family Medicine

## 2024-07-25 VITALS — BP 138/90 | HR 124 | Ht 69.0 in | Wt 175.4 lb

## 2024-07-25 DIAGNOSIS — G8929 Other chronic pain: Secondary | ICD-10-CM

## 2024-07-25 DIAGNOSIS — M542 Cervicalgia: Secondary | ICD-10-CM

## 2024-07-25 DIAGNOSIS — M4802 Spinal stenosis, cervical region: Secondary | ICD-10-CM | POA: Diagnosis not present

## 2024-07-25 DIAGNOSIS — I10 Essential (primary) hypertension: Secondary | ICD-10-CM

## 2024-07-25 DIAGNOSIS — F5104 Psychophysiologic insomnia: Secondary | ICD-10-CM

## 2024-07-25 MED ORDER — LISINOPRIL-HYDROCHLOROTHIAZIDE 10-12.5 MG PO TABS
1.0000 | ORAL_TABLET | Freq: Every day | ORAL | 3 refills | Status: AC
Start: 2024-07-25 — End: ?

## 2024-07-25 NOTE — Telephone Encounter (Signed)
 Pt wants to inform Dr.Y that he does not want to do the surgery at this time, but will at a later time if needed. Also, he will be trying some CBD gummies.
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
## 2024-07-25 NOTE — Patient Instructions (Addendum)
 Thank you for coming to the office today.  Hemp or CBD Cream for pain relief. Can try this first and then in future if you prefer oral gummy  Call Dr Clois let him know you are not planning to pursue the spine surgery  Okay with the urinary symptoms, if worsening call Dr Francisca locally  Okay to take the 2nd dose extra Trazodone  if you absolutely need it.  DUE for FASTING BLOOD WORK (no food or drink after midnight before the lab appointment, only water  or coffee without cream/sugar on the morning of)  SCHEDULE Lab Only visit in the morning at the clinic for lab draw in  4 MONTHS   - Make sure Lab Only appointment is at about 1 week before your next appointment, so that results will be available  For Lab Results, once available within 2-3 days of blood draw, you can can log in to MyChart online to view your results and a brief explanation. Also, we can discuss results at next follow-up visit.   Please schedule a Follow-up Appointment to: Return in about 4 months (around 11/22/2024) for 4 month fasting lab > 1 week later Follow-up lab results, specialist.  If you have any other questions or concerns, please feel free to call the office or send a message through MyChart. You may also schedule an earlier appointment if necessary.  Additionally, you may be receiving a survey about your experience at our office within a few days to 1 week by e-mail or mail. We value your feedback.  Marsa Officer, DO [facility], [place]

## 2024-07-26 ENCOUNTER — Other Ambulatory Visit: Payer: Self-pay

## 2024-07-27 ENCOUNTER — Other Ambulatory Visit: Payer: Self-pay | Admitting: Family Medicine

## 2024-07-27 DIAGNOSIS — E7800 Pure hypercholesterolemia, unspecified: Secondary | ICD-10-CM

## 2024-07-29 NOTE — Telephone Encounter (Signed)
 Requested Prescriptions  Refused Prescriptions Disp Refills   atorvastatin  (LIPITOR) 20 MG tablet [Pharmacy Med Name: Atorvastatin  Calcium  20 MG Oral Tablet] 100 tablet 2    Sig: TAKE 1 TABLET BY MOUTH DAILY AT  6PM     Cardiovascular:  Antilipid - Statins Failed - 07/29/2024 12:23 PM      Failed - Lipid Panel in normal range within the last 12 months    Cholesterol, Total  Date Value Ref Range Status  04/20/2017 113 100 - 199 mg/dL Final   Cholesterol  Date Value Ref Range Status  09/23/2022 196 <200 mg/dL Final   LDL Cholesterol (Calc)  Date Value Ref Range Status  09/23/2022 128 (H) mg/dL (calc) Final    Comment:    Reference range: <100 . Desirable range <100 mg/dL for primary prevention;   <70 mg/dL for patients with CHD or diabetic patients  with > or = 2 CHD risk factors. SABRA LDL-C is now calculated using the Martin-Hopkins  calculation, which is a validated novel method providing  better accuracy than the Friedewald equation in the  estimation of LDL-C.  Gladis APPLETHWAITE et al. SANDREA. 7986;689(80): 2061-2068  (http://education.[employer].com/faq/FAQ164)    HDL  Date Value Ref Range Status  09/23/2022 53 > OR = 40 mg/dL Final  91/91/7981 26 (L) >39 mg/dL Final   Triglycerides  Date Value Ref Range Status  09/23/2022 56 <150 mg/dL Final         Passed - Patient is not pregnant      Passed - Valid encounter within last 12 months    Recent Outpatient Visits           4 days ago Psychophysiological insomnia   [place] [facility], Marsa PARAS, DO   4 months ago Constipation, unspecified constipation type   [facility] [place] Marsa PARAS, DO   7 months ago Bilateral leg weakness   [place] [facility] [place] Marsa PARAS, DO   8 months ago Bilateral impacted cerumen   [facility] [place] Marsa PARAS, DO   9 months ago Bilateral impacted  cerumen   [facility] [place], Marsa PARAS, [place]

## 2024-08-07 ENCOUNTER — Emergency Department

## 2024-08-07 ENCOUNTER — Observation Stay
Admission: EM | Admit: 2024-08-07 | Discharge: 2024-08-13 | Disposition: A | Discharge status: Skilled Nursing Facility | Attending: Emergency Medicine | Admitting: Emergency Medicine

## 2024-08-07 ENCOUNTER — Other Ambulatory Visit: Payer: Self-pay

## 2024-08-07 DIAGNOSIS — Z1152 Encounter for screening for COVID-19: Secondary | ICD-10-CM | POA: Insufficient documentation

## 2024-08-07 DIAGNOSIS — I959 Hypotension, unspecified: Secondary | ICD-10-CM

## 2024-08-07 DIAGNOSIS — Z7722 Contact with and (suspected) exposure to environmental tobacco smoke (acute) (chronic): Secondary | ICD-10-CM | POA: Insufficient documentation

## 2024-08-07 DIAGNOSIS — R8290 Unspecified abnormal findings in urine: Secondary | ICD-10-CM | POA: Insufficient documentation

## 2024-08-07 DIAGNOSIS — Z87891 Personal history of nicotine dependence: Secondary | ICD-10-CM | POA: Insufficient documentation

## 2024-08-07 DIAGNOSIS — F32A Depression, unspecified: Secondary | ICD-10-CM | POA: Diagnosis not present

## 2024-08-07 DIAGNOSIS — F19929 Other psychoactive substance use, unspecified with intoxication, unspecified: Secondary | ICD-10-CM | POA: Diagnosis present

## 2024-08-07 DIAGNOSIS — R682 Dry mouth, unspecified: Secondary | ICD-10-CM | POA: Insufficient documentation

## 2024-08-07 DIAGNOSIS — M6281 Muscle weakness (generalized): Secondary | ICD-10-CM | POA: Diagnosis not present

## 2024-08-07 DIAGNOSIS — Z79890 Hormone replacement therapy (postmenopausal): Secondary | ICD-10-CM | POA: Insufficient documentation

## 2024-08-07 DIAGNOSIS — E039 Hypothyroidism, unspecified: Secondary | ICD-10-CM | POA: Diagnosis not present

## 2024-08-07 DIAGNOSIS — K219 Gastro-esophageal reflux disease without esophagitis: Secondary | ICD-10-CM | POA: Insufficient documentation

## 2024-08-07 DIAGNOSIS — I10 Essential (primary) hypertension: Secondary | ICD-10-CM | POA: Diagnosis not present

## 2024-08-07 DIAGNOSIS — R531 Weakness: Secondary | ICD-10-CM | POA: Insufficient documentation

## 2024-08-07 DIAGNOSIS — Z79899 Other long term (current) drug therapy: Secondary | ICD-10-CM | POA: Insufficient documentation

## 2024-08-07 DIAGNOSIS — D509 Iron deficiency anemia, unspecified: Secondary | ICD-10-CM | POA: Diagnosis not present

## 2024-08-07 DIAGNOSIS — T40711A Poisoning by cannabis, accidental (unintentional), initial encounter: Secondary | ICD-10-CM | POA: Diagnosis not present

## 2024-08-07 DIAGNOSIS — F19921 Other psychoactive substance use, unspecified with intoxication with delirium: Secondary | ICD-10-CM | POA: Diagnosis not present

## 2024-08-07 DIAGNOSIS — E785 Hyperlipidemia, unspecified: Secondary | ICD-10-CM | POA: Diagnosis not present

## 2024-08-07 LAB — CBC WITH DIFFERENTIAL/PLATELET
Abs Immature Granulocytes: 0.05 K/uL (ref 0.00–0.07)
Basophils Absolute: 0 K/uL (ref 0.0–0.1)
Basophils Relative: 0 %
Eosinophils Absolute: 0 K/uL (ref 0.0–0.5)
Eosinophils Relative: 1 %
HCT: 37.3 % — ABNORMAL LOW (ref 39.0–52.0)
Hemoglobin: 12.3 g/dL — ABNORMAL LOW (ref 13.0–17.0)
Immature Granulocytes: 1 %
Lymphocytes Relative: 8 %
Lymphs Abs: 0.5 K/uL — ABNORMAL LOW (ref 0.7–4.0)
MCH: 22.2 pg — ABNORMAL LOW (ref 26.0–34.0)
MCHC: 33 g/dL (ref 30.0–36.0)
MCV: 67.2 fL — ABNORMAL LOW (ref 80.0–100.0)
Monocytes Absolute: 0.6 K/uL (ref 0.1–1.0)
Monocytes Relative: 8 %
Neutro Abs: 5.6 K/uL (ref 1.7–7.7)
Neutrophils Relative %: 82 %
Platelets: 185 K/uL (ref 150–400)
RBC: 5.55 MIL/uL (ref 4.22–5.81)
RDW: 16.7 % — ABNORMAL HIGH (ref 11.5–15.5)
Smear Review: NORMAL
WBC: 6.8 K/uL (ref 4.0–10.5)
nRBC: 0 % (ref 0.0–0.2)

## 2024-08-07 LAB — URINALYSIS, ROUTINE W REFLEX MICROSCOPIC
Bilirubin Urine: NEGATIVE
Glucose, UA: NEGATIVE mg/dL
Hgb urine dipstick: NEGATIVE
Ketones, ur: NEGATIVE mg/dL
Nitrite: NEGATIVE
Protein, ur: 30 mg/dL — AB
Specific Gravity, Urine: 1.018 (ref 1.005–1.030)
WBC, UA: >50 WBC/hpf (ref 0–5)
pH: 6 (ref 5.0–8.0)

## 2024-08-07 LAB — LACTIC ACID, PLASMA: Lactic Acid, Venous: 2.2 mmol/L (ref 0.5–1.9)

## 2024-08-07 LAB — URINE DRUG SCREEN
Amphetamines: NEGATIVE
Barbiturates: NEGATIVE
Benzodiazepines: NEGATIVE
Cocaine: NEGATIVE
Fentanyl: NEGATIVE
Methadone Scn, Ur: NEGATIVE
Opiates: NEGATIVE
Tetrahydrocannabinol: POSITIVE — AB

## 2024-08-07 LAB — MAGNESIUM: Magnesium: 1.9 mg/dL (ref 1.7–2.4)

## 2024-08-07 LAB — RESP PANEL BY RT-PCR (RSV, FLU A&B, COVID)  RVPGX2
Influenza A by PCR: NEGATIVE
Influenza B by PCR: NEGATIVE
Resp Syncytial Virus by PCR: NEGATIVE
SARS Coronavirus 2 by RT PCR: NEGATIVE

## 2024-08-07 LAB — COMPREHENSIVE METABOLIC PANEL WITH GFR
ALT: 10 U/L (ref 0–44)
AST: 18 U/L (ref 15–41)
Albumin: 3.6 g/dL (ref 3.5–5.0)
Alkaline Phosphatase: 49 U/L (ref 38–126)
Anion gap: 9 (ref 5–15)
BUN: 21 mg/dL (ref 8–23)
CO2: 24 mmol/L (ref 22–32)
Calcium: 9.2 mg/dL (ref 8.9–10.3)
Chloride: 98 mmol/L (ref 98–111)
Creatinine, Ser: 1.15 mg/dL (ref 0.61–1.24)
GFR, Estimated: >60 mL/min (ref >60)
Glucose, Bld: 131 mg/dL — ABNORMAL HIGH (ref 70–99)
Potassium: 4.3 mmol/L (ref 3.5–5.1)
Sodium: 131 mmol/L — ABNORMAL LOW (ref 135–145)
Total Bilirubin: 0.3 mg/dL (ref 0.0–1.2)
Total Protein: 5.8 g/dL — ABNORMAL LOW (ref 6.5–8.1)

## 2024-08-07 LAB — IRON AND TIBC
Iron: 32 ug/dL — ABNORMAL LOW (ref 45–182)
Saturation Ratios: 15 % — ABNORMAL LOW (ref 17.9–39.5)
TIBC: 214 ug/dL — ABNORMAL LOW (ref 250–450)
UIBC: 182 ug/dL

## 2024-08-07 LAB — SALICYLATE LEVEL: Salicylate Lvl: <7 mg/dL — ABNORMAL LOW (ref 7.0–30.0)

## 2024-08-07 LAB — FERRITIN: Ferritin: 526 ng/mL — ABNORMAL HIGH (ref 24–336)

## 2024-08-07 LAB — CBG MONITORING, ED: Glucose-Capillary: 138 mg/dL — ABNORMAL HIGH (ref 70–99)

## 2024-08-07 LAB — ETHANOL: Alcohol, Ethyl (B): <15 mg/dL (ref <15)

## 2024-08-07 LAB — PROCALCITONIN: Procalcitonin: <0.1 ng/mL

## 2024-08-07 LAB — TROPONIN T, HIGH SENSITIVITY: Troponin T High Sensitivity: 22 ng/L — ABNORMAL HIGH (ref 0–19)

## 2024-08-07 LAB — ACETAMINOPHEN LEVEL: Acetaminophen (Tylenol), Serum: <10 ug/mL — ABNORMAL LOW (ref 10–30)

## 2024-08-07 MED ORDER — BUSPIRONE HCL 10 MG PO TABS
10.0000 mg | ORAL_TABLET | Freq: Two times a day (BID) | ORAL | Status: DC
  Administered 2024-08-07 – 2024-08-13 (×13): 10 mg via ORAL
  Filled 2024-08-07 (×13): qty 1

## 2024-08-07 MED ORDER — BISACODYL 5 MG PO TBEC
5.0000 mg | DELAYED_RELEASE_TABLET | Freq: Every day | ORAL | Status: DC | PRN
  Administered 2024-08-09 – 2024-08-10 (×2): 5 mg via ORAL
  Filled 2024-08-07 (×4): qty 1

## 2024-08-07 MED ORDER — ENSURE PLUS HIGH PROTEIN PO LIQD
237.0000 mL | Freq: Two times a day (BID) | ORAL | Status: DC
  Administered 2024-08-08 – 2024-08-13 (×8): 237 mL via ORAL

## 2024-08-07 MED ORDER — ACETAMINOPHEN 325 MG PO TABS
650.0000 mg | ORAL_TABLET | Freq: Four times a day (QID) | ORAL | Status: DC | PRN
  Administered 2024-08-12: 650 mg via ORAL
  Filled 2024-08-07: qty 2

## 2024-08-07 MED ORDER — SODIUM CHLORIDE 0.9 % IV SOLN: INTRAVENOUS | Status: DC

## 2024-08-07 MED ORDER — SODIUM CHLORIDE 0.9 % IV SOLN: INTRAVENOUS | Status: AC

## 2024-08-07 MED ORDER — LEVOTHYROXINE SODIUM 50 MCG PO TABS
75.0000 ug | ORAL_TABLET | Freq: Every day | ORAL | Status: DC
  Administered 2024-08-08 – 2024-08-13 (×6): 75 ug via ORAL
  Filled 2024-08-07 (×6): qty 1

## 2024-08-07 MED ORDER — BUPROPION HCL ER (XL) 150 MG PO TB24
450.0000 mg | ORAL_TABLET | Freq: Every day | ORAL | Status: DC
  Administered 2024-08-07 – 2024-08-13 (×7): 450 mg via ORAL
  Filled 2024-08-07 (×7): qty 3

## 2024-08-07 MED ORDER — SODIUM CHLORIDE 0.9 % IV BOLUS (SEPSIS)
1000.0000 mL | Freq: Once | INTRAVENOUS | Status: AC
  Administered 2024-08-07: 1000 mL via INTRAVENOUS

## 2024-08-07 MED ORDER — POLYETHYLENE GLYCOL 3350 17 G PO PACK
17.0000 g | PACK | Freq: Every day | ORAL | Status: DC
  Administered 2024-08-08 – 2024-08-13 (×5): 17 g via ORAL
  Filled 2024-08-07 (×6): qty 1

## 2024-08-07 MED ORDER — AZITHROMYCIN 500 MG IV SOLR
500.0000 mg | Freq: Once | INTRAVENOUS | Status: AC
  Administered 2024-08-07: 500 mg via INTRAVENOUS
  Filled 2024-08-07: qty 5

## 2024-08-07 MED ORDER — MIRTAZAPINE 15 MG PO TABS
30.0000 mg | ORAL_TABLET | Freq: Every day | ORAL | Status: DC
  Administered 2024-08-07 – 2024-08-12 (×6): 30 mg via ORAL
  Filled 2024-08-07 (×6): qty 2

## 2024-08-07 MED ORDER — TRAZODONE HCL 50 MG PO TABS
50.0000 mg | ORAL_TABLET | Freq: Every day | ORAL | Status: DC
  Administered 2024-08-07: 50 mg via ORAL
  Filled 2024-08-07 (×2): qty 1

## 2024-08-07 MED ORDER — MIDODRINE HCL 5 MG PO TABS
10.0000 mg | ORAL_TABLET | Freq: Once | ORAL | Status: AC
  Administered 2024-08-07: 10 mg via ORAL
  Filled 2024-08-07: qty 2

## 2024-08-07 MED ORDER — ONDANSETRON HCL 4 MG PO TABS: 4.0000 mg | ORAL_TABLET | Freq: Four times a day (QID) | ORAL | Status: DC | PRN

## 2024-08-07 MED ORDER — ATORVASTATIN CALCIUM 20 MG PO TABS
20.0000 mg | ORAL_TABLET | Freq: Every day | ORAL | Status: DC
  Administered 2024-08-07 – 2024-08-13 (×7): 20 mg via ORAL
  Filled 2024-08-07 (×7): qty 1

## 2024-08-07 MED ORDER — SODIUM CHLORIDE 0.9 % IV SOLN
2.0000 g | Freq: Once | INTRAVENOUS | Status: AC
  Administered 2024-08-07: 2 g via INTRAVENOUS
  Filled 2024-08-07: qty 20

## 2024-08-07 MED ORDER — ACETAMINOPHEN 650 MG RE SUPP: 650.0000 mg | Freq: Four times a day (QID) | RECTAL | Status: DC | PRN

## 2024-08-07 MED ORDER — ENOXAPARIN SODIUM 40 MG/0.4ML IJ SOSY
40.0000 mg | PREFILLED_SYRINGE | INTRAMUSCULAR | Status: DC
  Administered 2024-08-07 – 2024-08-13 (×7): 40 mg via SUBCUTANEOUS
  Filled 2024-08-07 (×7): qty 0.4

## 2024-08-07 MED ORDER — ALBUTEROL SULFATE (2.5 MG/3ML) 0.083% IN NEBU: 2.5000 mg | INHALATION_SOLUTION | RESPIRATORY_TRACT | Status: DC | PRN

## 2024-08-07 MED ORDER — ONDANSETRON HCL 4 MG/2ML IJ SOLN: 4.0000 mg | Freq: Four times a day (QID) | INTRAMUSCULAR | Status: DC | PRN

## 2024-08-07 NOTE — ED Notes (Signed)
Fall bundle in place

## 2024-08-07 NOTE — ED Provider Notes (Signed)
 [facility] Provider Note    Event Date/Time   First MD Initiated Contact with Patient 08/07/24 0201     (approximate)   History   Ingestion   HPI  Joe Hardy is a 83 y.o. male with history of bladder cancer, hypertension, hyperlipidemia who presents to the emergency department with concerns for dehydration.  He states that he has chronic neck pain and around 4 PM yesterday he took 1 delta 8 gummy to help with his discomfort.  He states he used to smoke marijuana regularly 20+ years ago.  He states he started having very dry mouth and felt like he was dehydrated.  He denies any other coingestions.  Denies that this was an attempt to harm himself.  Denies chest pain, shortness of breath, vomiting, diarrhea, fever.  Blood pressure currently in the upper 90s systolic.  States that he used to smoke marijuana for 35 years but never recalls having symptoms like this.  He has never taken a cannabinoid edible before.   History provided by patient and EMS.    Past Medical History:  Diagnosis Date   Bladder stones    Depression    GERD (gastroesophageal reflux disease)    Hyperlipidemia    Hypertension    Iron  deficiency anemia    Migraine    Sleep apnea     Past Surgical History:  Procedure Laterality Date   COLONOSCOPY     CYSTOSCOPY WITH LITHOLAPAXY N/A 11/26/2022   Procedure: CYSTOSCOPY WITH LITHOLAPAXY;  Surgeon: Francisca Redell BROCKS, MD;  Location: [facility] ORS;  Service: Urology;  Laterality: N/A;   HOLEP-LASER ENUCLEATION OF THE PROSTATE WITH MORCELLATION N/A 11/26/2022   Procedure: HOLEP-LASER ENUCLEATION OF THE PROSTATE WITH MORCELLATION;  Surgeon: Francisca Redell BROCKS, MD;  Location: [facility] ORS;  Service: Urology;  Laterality: N/A;   oral surgery     TONSILLECTOMY      MEDICATIONS:  Prior to Admission medications   Medication Sig Start Date End Date Taking? Authorizing Provider  Ascorbic Acid (VITAMIN C) 1000 MG tablet Take 1,000 mg by mouth daily.     [provider]  atorvastatin  (LIPITOR) 20 MG tablet TAKE 1 TABLET BY MOUTH DAILY AT  6PM 09/19/23   Edman Marsa PARAS, DO  b complex vitamins tablet Take 1 tablet by mouth daily.    [provider]  baclofen  (LIORESAL ) 10 MG tablet Take 0.5-1 tablets (5-10 mg total) by mouth 2 (two) times daily as needed for muscle spasms (for back). Do not take on same day as Flexeril  05/21/24   Karamalegos, Marsa PARAS, DO  Blood Pressure Monitoring (SM WRIST CUFF BP MONITOR) MISC 1 Units by Does not apply route 2 (two) times a week. 02/14/20   Malfi, Nat HERO, FNP  buPROPion  (WELLBUTRIN  XL) 150 MG 24 hr tablet Take 450 mg by mouth daily. 05/19/24   [provider]  busPIRone  (BUSPAR ) 10 MG tablet TAKE 1 TABLET BY MOUTH TWICE  DAILY 11/28/23   Edman, Marsa PARAS, DO  Cholecalciferol (VITAMIN D) 50 MCG (2000 UT) tablet Take 4,000 Units by mouth daily. 01/07/23   [provider]  Cobalamin Combinations (NEURIVA PLUS PO) Take 1 capsule by mouth daily in the afternoon.    [provider]  cyanocobalamin 1000 MCG tablet Take 1,000 mcg by mouth daily.    [provider]  cyclobenzaprine  (FLEXERIL ) 10 MG tablet Take 1 tablet (10 mg total) by mouth 2 (two) times daily as needed for muscle spasms (for neck). Do not  take on same day as Baclofen  05/21/24   Karamalegos, Marsa PARAS, DO  Iron , Ferrous Sulfate , 325 (65 Fe) MG TABS Take 1 tablet by mouth daily. 12/14/22   Awanda City, MD  levothyroxine  (SYNTHROID ) 75 MCG tablet TAKE 1 TABLET BY MOUTH DAILY  BEFORE BREAKFAST 06/26/24   Karamalegos, Marsa PARAS, DO  lisinopril -hydrochlorothiazide  (ZESTORETIC ) 10-12.5 MG tablet Take 1 tablet by mouth daily. 07/25/24   Karamalegos, Marsa PARAS, DO  magnesium  30 MG tablet Take 1 tablet (30 mg total) by mouth daily. 11/26/22   Francisca Redell BROCKS, MD  MIRALAX  17 GM/SCOOP powder Take 17 g by mouth daily. 01/07/23   [provider]  mirtazapine  (REMERON ) 30 MG tablet Take 30 mg by  mouth at bedtime. 05/19/24   [provider]  terazosin  (HYTRIN ) 5 MG capsule TAKE 1 CAPSULE BY MOUTH DAILY 01/24/24   Edman, Marsa PARAS, DO  traZODone  (DESYREL ) 150 MG tablet Take 150 mg by mouth at bedtime. 05/19/24   [provider]    Physical Exam   Triage Vital Signs: ED Triage Vitals  Encounter Vitals Group     BP 08/07/24 0151 102/70     Girls Systolic BP Percentile --      Girls Diastolic BP Percentile --      Boys Systolic BP Percentile --      Boys Diastolic BP Percentile --      Pulse Rate 08/07/24 0151 95     Resp 08/07/24 0151 14     Temp 08/07/24 0151 98 F (36.7 C)     Temp src --      SpO2 08/07/24 0148 96 %     Weight --      Height --      Head Circumference --      Peak Flow --      Pain Score --      Pain Loc --      Pain Education --      Exclude from Growth Chart --     Most recent vital signs: Vitals:   08/07/24 0730 08/07/24 0741  BP: (!) 84/62   Pulse: 74   Resp: 13   Temp:  (!) 97.4 F (36.3 C)  SpO2: 94%     CONSTITUTIONAL: Alert, responds appropriately to questions.  Elderly, pleasant HEAD: Normocephalic, atraumatic EYES: Conjunctivae slightly injected bilaterally, pupils appear equal, sclera nonicteric ENT: normal nose; very dry appearing mucous membranes NECK: Supple, normal ROM CARD: RRR; S1 and S2 appreciated RESP: Normal chest excursion without splinting or tachypnea; breath sounds clear and equal bilaterally; no wheezes, no rhonchi, no rales, no hypoxia or respiratory distress, speaking full sentences ABD/GI: Non-distended; soft, non-tender, no rebound, no guarding, no peritoneal signs BACK: The back appears normal EXT: Normal ROM in all joints; no deformity noted, no edema SKIN: Normal color for age and race; warm; no rash on exposed skin NEURO: Moves all extremities equally, normal speech PSYCH: The patient's mood and manner are appropriate.  Denies SI.   ED Results / Procedures / Treatments    LABS: (all labs ordered are listed, but only abnormal results are displayed) Labs Reviewed  CBC WITH DIFFERENTIAL/PLATELET - Abnormal; Notable for the following components:      Result Value   Hemoglobin 12.3 (*)    HCT 37.3 (*)    MCV 67.2 (*)    MCH 22.2 (*)    RDW 16.7 (*)    Lymphs Abs 0.5 (*)    All other components within normal  limits  COMPREHENSIVE METABOLIC PANEL WITH GFR - Abnormal; Notable for the following components:   Sodium 131 (*)    Glucose, Bld 131 (*)    Total Protein 5.8 (*)    All other components within normal limits  ACETAMINOPHEN  LEVEL - Abnormal; Notable for the following components:   Acetaminophen  (Tylenol ), Serum <10 (*)    All other components within normal limits  SALICYLATE LEVEL - Abnormal; Notable for the following components:   Salicylate Lvl <7.0 (*)    All other components within normal limits  LACTIC ACID, PLASMA - Abnormal; Notable for the following components:   Lactic Acid, Venous 2.2 (*)    All other components within normal limits  CBG MONITORING, ED - Abnormal; Notable for the following components:   Glucose-Capillary 138 (*)    All other components within normal limits  TROPONIN T, HIGH SENSITIVITY - Abnormal; Notable for the following components:   Troponin T High Sensitivity 22 (*)    All other components within normal limits  CULTURE, BLOOD (SINGLE)  RESP PANEL BY RT-PCR (RSV, FLU A&B, COVID)  RVPGX2  CULTURE, BLOOD (SINGLE)  ETHANOL  URINALYSIS, ROUTINE W REFLEX MICROSCOPIC  URINE DRUG SCREEN  PROCALCITONIN     EKG:  EKG Interpretation Date/Time:  Tuesday August 07 2024 04:20:57 EST Ventricular Rate:  85 PR Interval:  309 QRS Duration:  145 QT Interval:  387 QTC Calculation: 461 R Axis:   -15  Text Interpretation: Sinus rhythm Prolonged PR interval Right bundle branch block Confirmed by Neomi Neptune [phone]) on 08/07/2024 4:33:53 AM         RADIOLOGY: My personal review and interpretation of imaging:  Chest x-ray shows atelectasis versus pneumonia at the left lower lung.  I have personally reviewed all radiology reports.   DG Chest Portable 1 View Result Date: 08/07/2024 EXAM: 1 VIEW(S) XRAY OF THE CHEST 08/07/2024 05:42:03 AM COMPARISON: PA and lateral radiographs of the chest dated 06/12/2012. CLINICAL HISTORY: Hypotension, likely medication related but want to rule out sepsis. FINDINGS: LUNGS AND PLEURA: Lower lung volumes. Mild streaky opacity at left lung base. No pleural effusion. No pneumothorax. HEART AND MEDIASTINUM: No acute abnormality of the cardiac and mediastinal silhouettes. BONES AND SOFT TISSUES: No acute osseous abnormality. IMPRESSION: 1. No acute findings. 2. Lower lung volumes and mild streaky opacity at left lung base. Electronically signed by: Evalene Coho MD 08/07/2024 05:50 AM EST RP Workstation: [id number]     PROCEDURES:  Critical Care performed: Yes, see critical care procedure note(s)   CRITICAL CARE Performed by: Neptune Neomi   Total critical care time: 30 minutes  Critical care time was exclusive of separately billable procedures and treating other patients.  Critical care was necessary to treat or prevent imminent or life-threatening deterioration.  Critical care was time spent personally by me on the following activities: development of treatment plan with patient and/or surrogate as well as nursing, discussions with consultants, evaluation of patient's response to treatment, examination of patient, obtaining history from patient or surrogate, ordering and performing treatments and interventions, ordering and review of laboratory studies, ordering and review of radiographic studies, pulse oximetry and re-evaluation of patient's condition.   SABRA1-3 Lead EKG Interpretation  Performed by: Makinzy Cleere, Neptune SAILOR, DO Authorized by: Christmas Faraci, Neptune SAILOR, DO     Interpretation: normal     ECG rate:  95   ECG rate assessment: normal     Rhythm: sinus rhythm      Ectopy: none     Conduction: normal  IMPRESSION / MDM / ASSESSMENT AND PLAN / ED COURSE  I reviewed the triage vital signs and the nursing notes.    Patient here with concerns that he is dehydrated after ingesting a cannabinoid gummy about 10 hours ago.  The patient is on the cardiac monitor to evaluate for evidence of arrhythmia and/or significant heart rate changes.   DIFFERENTIAL DIAGNOSIS (includes but not limited to):   Cannabinol overdose, dehydration, medication side effect, intoxication, doubt sepsis, anemia, intentional overdose   Patient's presentation is most consistent with acute presentation with potential threat to life or bodily function.   PLAN: Will check labs, EKG, urine.  Will give IV fluids and encourage oral fluids here.  He does have very dry mucous membranes on exam and is borderline hypotensive but no signs or symptoms of sepsis today.  Will monitor closely.   MEDICATIONS GIVEN IN ED: Medications  azithromycin  (ZITHROMAX ) 500 mg in sodium chloride  0.9 % 250 mL IVPB (500 mg Intravenous New Bag/Given 08/07/24 0732)  sodium chloride  0.9 % bolus 1,000 mL (0 mLs Intravenous Stopped 08/07/24 0538)  sodium chloride  0.9 % bolus 1,000 mL (0 mLs Intravenous Stopped 08/07/24 0623)  midodrine  (PROAMATINE ) tablet 10 mg (10 mg Oral Given 08/07/24 0553)  cefTRIAXone  (ROCEPHIN ) 2 g in sodium chloride  0.9 % 100 mL IVPB (0 g Intravenous Stopped 08/07/24 0730)     ED COURSE: Labs show normal hemoglobin.  Normal electrolytes.  Negative Tylenol , salicylate and ethanol level.  EKG nonischemic.  Lactic minimally elevated at 2.2.  Chest x-ray reviewed and interpreted by myself and the radiologist and shows streaky opacity at the left lower base which could be atelectasis versus pneumonia.  He denies any infectious symptoms but given elevated lactic acid level and hypotension, I will go and give him antibiotics for potential coverage although hypotension is likely secondary  to cannabinol overdose.  Given patient is still very symptomatic and hypotensive despite 2 L of fluids and having taken this  gummy over 12 hours ago, have recommended admission for observation and continued hydration as it looks like his blood pressure is normally pretty elevated with systolics in the 130s to 170s and he is on lisinopril  and HCTZ.  Patient comfortable with this plan.   CONSULTS:  Consulted and discussed patient's case with hospitalist, Dr. Lenon.  I have recommended admission and consulting physician agrees and will place admission orders.  Patient (and family if present) agree with this plan.   I reviewed all nursing notes, vitals, pertinent previous records.  All labs, EKGs, imaging ordered have been independently reviewed and interpreted by myself.    OUTSIDE RECORDS REVIEWED: Reviewed recent internal medicine notes.       FINAL CLINICAL IMPRESSION(S) / ED DIAGNOSES   Final diagnoses:  Cannabis overdose, accidental or unintentional, initial encounter  Hypotension, unspecified hypotension type     Rx / DC Orders   ED Discharge Orders     None        Note:  This document was prepared using Dragon voice recognition software and may include unintentional dictation errors.   Karlene Southard, Josette SAILOR, DO 08/07/24 [phone]

## 2024-08-07 NOTE — ED Notes (Signed)
 Patient cell phone has been located and it is at his house per etheleen Race [phone]

## 2024-08-07 NOTE — ED Triage Notes (Addendum)
 Pt to ED BIB [facility] with c/o ingestion. Pt tried Delta 8 gummies for the first time tonight. Was not aware that he was supposed to split the gummy and instead took a whole one which is 200mg . Pt A&Ox4, VSS. Pt's only complaint is dry mouth.

## 2024-08-07 NOTE — H&P (Signed)
 History and Physical  Lambros Cerro FMW:[id number] DOB: 01/20/1941 DOA: 08/07/2024 PCP: Edman Marsa PARAS, DO  Chief Complaint: Grogginess Historian: patient  HPI:  Joe Hardy is a 83 y.o. male with a PMH significant for HTN, HLD, bladder cancer.  They presented from home to the ED on 08/07/2024 with dry mouth and feeling groggy after trying a delta 8 gummy for the first time.  He denies any other symptoms or concerns.  He consumed for recreational use and has previously used marijuana for analgesic effects but stopped several years ago. Patient falls asleep and unable to provide further history at this time. Questions when he was awake were answered appropriately.  In the ED, it was found that they had stable vital signs with borderline low blood pressure.  Significant findings included: WBC 6.8, hemoglobin 12.3, MCV 67.2, platelets 185.  They were initially treated with midodrine , 2 L fluid bolus, azithromycin , ceftriaxone ..   Patient was admitted to medicine service for further workup and management of hypotension as outlined in detail below.  Assessment/Plan Principal Problem:   Intoxication by drug (HCC)   Intoxication by delta 8- groggy but easy to arouse and is oriented. No focal findings. - supportive care while it washes out of his system  Hypotension- possible related to poor PO intake and dehydration. Less likely related to his delta 8 use. Improved with IVFs. Asymptomatic. Unable to assess if he took his home BP meds but will hold them for today and can add back on as needed tomorrow.  - continue to monitor per floor protocol - continue IVF - no source of infection is identified, is afebrile, and no leukocytosis so do not suspect infection and won't continue antibiotics at this time. Did receive azithromycin  and CTX in ED. Blood cultures were collected and will follow. Neg RSV, flu, covid  Microcytic anemia-appears to be stable at his baseline. May contribute  to his fatigue.  - iron  panel am  Depression- History of depression but denies suicidal ideation currently. - Continue home Wellbutrin  and BuSpar   Hypothyroidism-continue home levothyroxine  - Thyroid  panel to evaluate for contribution to hypotension  Past Medical History:  Diagnosis Date   Bladder stones    Depression    GERD (gastroesophageal reflux disease)    Hyperlipidemia    Hypertension    Iron  deficiency anemia    Migraine    Sleep apnea     Past Surgical History:  Procedure Laterality Date   COLONOSCOPY     CYSTOSCOPY WITH LITHOLAPAXY N/A 11/26/2022   Procedure: CYSTOSCOPY WITH LITHOLAPAXY;  Surgeon: Francisca Redell BROCKS, MD;  Location: [facility] ORS;  Service: Urology;  Laterality: N/A;   HOLEP-LASER ENUCLEATION OF THE PROSTATE WITH MORCELLATION N/A 11/26/2022   Procedure: HOLEP-LASER ENUCLEATION OF THE PROSTATE WITH MORCELLATION;  Surgeon: Francisca Redell BROCKS, MD;  Location: [facility] ORS;  Service: Urology;  Laterality: N/A;   oral surgery     TONSILLECTOMY       reports that he quit smoking about 7 years ago. His smoking use included cigarettes. He started smoking about 27 years ago. He has a 30 pack-year smoking history. He has been exposed to tobacco smoke. He has quit using smokeless tobacco. He reports that he does not currently use drugs after having used the following drugs: Marijuana. He reports that he does not drink alcohol.  Allergies  Allergen Reactions   Fluoxetine Other (See Comments)    insomnia   Venlafaxine Other (See Comments)    difficulty sleeping    Family History  Problem Relation Age of Onset   Tuberculosis Mother    Asthma Mother    Emphysema Father    Heart attack Father     Prior to Admission medications   Medication Sig Start Date End Date Taking? Authorizing Provider  Ascorbic Acid (VITAMIN C) 1000 MG tablet Take 1,000 mg by mouth daily.    [provider]  atorvastatin  (LIPITOR) 20 MG tablet TAKE 1 TABLET BY MOUTH DAILY AT  6PM 09/19/23    Edman Marsa PARAS, DO  b complex vitamins tablet Take 1 tablet by mouth daily.    [provider]  baclofen  (LIORESAL ) 10 MG tablet Take 0.5-1 tablets (5-10 mg total) by mouth 2 (two) times daily as needed for muscle spasms (for back). Do not take on same day as Flexeril  05/21/24   Karamalegos, Marsa PARAS, DO  Blood Pressure Monitoring (SM WRIST CUFF BP MONITOR) MISC 1 Units by Does not apply route 2 (two) times a week. 02/14/20   Malfi, Nat HERO, FNP  buPROPion  (WELLBUTRIN  XL) 150 MG 24 hr tablet Take 450 mg by mouth daily. 05/19/24   [provider]  busPIRone  (BUSPAR ) 10 MG tablet TAKE 1 TABLET BY MOUTH TWICE  DAILY 11/28/23   Edman, Marsa PARAS, DO  Cholecalciferol (VITAMIN D) 50 MCG (2000 UT) tablet Take 4,000 Units by mouth daily. 01/07/23   [provider]  Cobalamin Combinations (NEURIVA PLUS PO) Take 1 capsule by mouth daily in the afternoon.    [provider]  cyanocobalamin 1000 MCG tablet Take 1,000 mcg by mouth daily.    [provider]  cyclobenzaprine  (FLEXERIL ) 10 MG tablet Take 1 tablet (10 mg total) by mouth 2 (two) times daily as needed for muscle spasms (for neck). Do not take on same day as Baclofen  05/21/24   Karamalegos, Marsa PARAS, DO  Iron , Ferrous Sulfate , 325 (65 Fe) MG TABS Take 1 tablet by mouth daily. 12/14/22   Awanda [place], MD  levothyroxine  (SYNTHROID ) 75 MCG tablet TAKE 1 TABLET BY MOUTH DAILY  BEFORE BREAKFAST 06/26/24   Karamalegos, Marsa PARAS, DO  lisinopril -hydrochlorothiazide  (ZESTORETIC ) 10-12.5 MG tablet Take 1 tablet by mouth daily. 07/25/24   Karamalegos, Marsa PARAS, DO  magnesium  30 MG tablet Take 1 tablet (30 mg total) by mouth daily. 11/26/22   Francisca Redell BROCKS, MD  MIRALAX  17 GM/SCOOP powder Take 17 g by mouth daily. 01/07/23   [provider]  mirtazapine  (REMERON ) 30 MG tablet Take 30 mg by mouth at bedtime. 05/19/24   [provider]  terazosin  (HYTRIN ) 5 MG capsule TAKE 1 CAPSULE BY  MOUTH DAILY 01/24/24   Edman, Marsa PARAS, DO  traZODone  (DESYREL ) 150 MG tablet Take 150 mg by mouth at bedtime. 05/19/24   [provider]   I have personally, briefly reviewed patient's prior medical records in [facility]  Objective: Blood pressure (!) 84/62, pulse 74, temperature (!) 97.4 F (36.3 C), temperature source Oral, resp. rate 13, height 5' 9 (1.753 m), weight 79.5 kg, SpO2 94%.   Constitutional: NAD, calm, comfortable. Lethargic. Easy to arouse with shoulder grab Respiratory: CTAB, no wheezing, no crackles. Normal respiratory effort. No accessory muscle use.  Cardiovascular: RRR, no murmurs / rubs / gallops. No extremity edema. 2+ pedal pulses. no clubbing / cyanosis.  Abdomen: soft, NT, ND, no masses or HSM palpated. Musculoskeletal: No joint deformity upper and lower extremities. Normal muscle tone.  Skin: dry, intact, normal color, normal temperature on exposed skin Neurologic: Alert and oriented x 3.  Normal speech. Grossly non-focal exam. PERRL Psychiatric: Normal mood. Congruent affect.  Labs on Admission: I have personally reviewed admission labs and imaging studies  CBC    Component Value Date/Time   WBC 6.8 08/07/2024 0303   RBC 5.55 08/07/2024 0303   HGB 12.3 (L) 08/07/2024 0303   HGB 13.0 04/22/2015 0818   HCT 37.3 (L) 08/07/2024 0303   HCT 38.5 04/22/2015 0818   PLT 185 08/07/2024 0303   PLT 257 04/22/2015 0818   MCV 67.2 (L) 08/07/2024 0303   MCV 69 (L) 04/22/2015 0818   MCH 22.2 (L) 08/07/2024 0303   MCHC 33.0 08/07/2024 0303   RDW 16.7 (H) 08/07/2024 0303   RDW 16.0 (H) 04/22/2015 0818   LYMPHSABS 0.5 (L) 08/07/2024 0303   LYMPHSABS 2.4 04/22/2015 0818   MONOABS 0.6 08/07/2024 0303   EOSABS 0.0 08/07/2024 0303   EOSABS 0.2 04/22/2015 0818   BASOSABS 0.0 08/07/2024 0303   BASOSABS 0.0 04/22/2015 0818   CMP     Component Value Date/Time   NA 131 (L) 08/07/2024 0303   NA 140 04/20/2017 0925   K 4.3 08/07/2024 0303   CL 98  08/07/2024 0303   CO2 24 08/07/2024 0303   GLUCOSE 131 (H) 08/07/2024 0303   BUN 21 08/07/2024 0303   BUN 9 04/20/2017 0925   CREATININE 1.15 08/07/2024 0303   CREATININE 1.04 09/23/2022 1012   CALCIUM  9.2 08/07/2024 0303   PROT 5.8 (L) 08/07/2024 0303   PROT 6.6 04/22/2015 0818   ALBUMIN 3.6 08/07/2024 0303   ALBUMIN 4.4 04/22/2015 0818   AST 18 08/07/2024 0303   ALT 10 08/07/2024 0303   ALKPHOS 49 08/07/2024 0303   BILITOT 0.3 08/07/2024 0303   BILITOT 0.4 04/22/2015 0818   GFRNONAA >60 08/07/2024 0303   GFRNONAA 85 03/09/2021 1119   GFRAA 98 03/09/2021 1119    Radiological Exams on Admission: DG Chest Portable 1 View Result Date: 08/07/2024 EXAM: 1 VIEW(S) XRAY OF THE CHEST 08/07/2024 05:42:03 AM COMPARISON: PA and lateral radiographs of the chest dated 06/12/2012. CLINICAL HISTORY: Hypotension, likely medication related but want to rule out sepsis. FINDINGS: LUNGS AND PLEURA: Lower lung volumes. Mild streaky opacity at left lung base. No pleural effusion. No pneumothorax. HEART AND MEDIASTINUM: No acute abnormality of the cardiac and mediastinal silhouettes. BONES AND SOFT TISSUES: No acute osseous abnormality. IMPRESSION: 1. No acute findings. 2. Lower lung volumes and mild streaky opacity at left lung base. Electronically signed by: Evalene Coho MD 08/07/2024 05:50 AM EST RP Workstation: [id number]   EKG: Independently reviewed. Not completed. Will order  DVT prophylaxis: enoxaparin  (LOVENOX ) injection 40 mg Start: 08/07/24 0945  Code Status: full, by default. Unable to complete assessment in current state.   Family Communication: none at bedside   Disposition Plan: admit for observation   Consults called: none    Marien LITTIE Piety, DO [facility]  08/07/2024, 9:45 AM    To contact the appropriate [facility] Attending or Consulting provider: Check [email or web address] for coverage from 7pm-7am

## 2024-08-07 NOTE — Plan of Care (Signed)
   Problem: Education: Goal: Knowledge of General Education information will improve Description: Including pain rating scale, medication(s)/side effects and non-pharmacologic comfort measures Outcome: Progressing   Problem: Health Behavior/Discharge Planning: Goal: Ability to manage health-related needs will improve Outcome: Progressing   Problem: Clinical Measurements: Goal: Will remain free from infection Outcome: Progressing Goal: Diagnostic test results will improve Outcome: Progressing Goal: Respiratory complications will improve Outcome: Progressing Goal: Cardiovascular complication will be avoided Outcome: Progressing   Problem: Activity: Goal: Risk for activity intolerance will decrease Outcome: Progressing   Problem: Nutrition: Goal: Adequate nutrition will be maintained Outcome: Progressing   Problem: Coping: Goal: Level of anxiety will decrease Outcome: Progressing   Problem: Elimination: Goal: Will not experience complications related to bowel motility Outcome: Progressing Goal: Will not experience complications related to urinary retention Outcome: Progressing   Problem: Pain Managment: Goal: General experience of comfort will improve and/or be controlled Outcome: Progressing   Problem: Safety: Goal: Ability to remain free from injury will improve Outcome: Progressing   Problem: Skin Integrity: Goal: Risk for impaired skin integrity will decrease Outcome: Progressing

## 2024-08-07 NOTE — Progress Notes (Signed)
 MEWS Progress Note  Patient Details Name: La Dibella MRN: [id number] DOB: [birth year] Today's Date: 08/07/2024   MEWS Flowsheet Documentation:  Assess: MEWS Score Temp: 98.2 F (36.8 C) BP: (!) 87/66 MAP (mmHg): 74 Pulse Rate: (!) 102 ECG Heart Rate: (!) 105 Resp: 17 Level of Consciousness: Alert SpO2: 98 % O2 Device: Room Air Patient Activity (if Appropriate): In bed Assess: MEWS Score MEWS Temp: 0 MEWS Systolic: 1 MEWS Pulse: 1 MEWS RR: 0 MEWS LOC: 0 MEWS Score: 2 MEWS Score Color: Yellow Assess: SIRS CRITERIA SIRS Temperature : 0 SIRS Respirations : 0 SIRS Pulse: 1 SIRS WBC: 0 SIRS Score Sum : 1 SIRS Temperature : 0 SIRS Pulse: 1 SIRS Respirations : 0 SIRS WBC: 0 SIRS Score Sum : 1 Assess: if the MEWS score is Yellow or Red Were vital signs accurate and taken at a resting state?: Yes Does the patient meet 2 or more of the SIRS criteria?: No MEWS guidelines implemented : Yes, yellow Treat MEWS Interventions: Considered administering scheduled or prn medications/treatments as ordered Take Vital Signs Increase Vital Sign Frequency : Yellow: Q2hr x1, continue Q4hrs until patient remains green for 12hrs Escalate MEWS: Escalate: Yellow: Discuss with charge nurse and consider notifying provider and/or RRT Notify: Charge Nurse/RN Name of Charge Nurse/RN Notified: Education Officer, Environmental Provider Notification Provider Name/Title: Marien Piety Date Provider Notified: 08/07/24 Time Provider Notified: 1629 Method of Notification: Page Notification Reason: Other (Comment) (BP 87/66) Provider response: See new orders Date of Provider Response: 08/07/24 Time of Provider Response: 1632  Orders received and administered, will continue to monitor.    Rosaline LABOR Rabab Currington 08/07/2024, 4:51 PM

## 2024-08-08 DIAGNOSIS — F19922 Other psychoactive substance use, unspecified with intoxication with perceptual disturbance: Secondary | ICD-10-CM

## 2024-08-08 DIAGNOSIS — I959 Hypotension, unspecified: Secondary | ICD-10-CM | POA: Diagnosis not present

## 2024-08-08 DIAGNOSIS — R531 Weakness: Secondary | ICD-10-CM

## 2024-08-08 LAB — THYROID PANEL
Free Thyroxine Index: 2.5 (ref 1.2–4.9)
T3 Uptake Ratio: 32 % (ref 24–39)
T4, Total: 7.8 ug/dL (ref 4.5–12.0)

## 2024-08-08 LAB — COMPREHENSIVE METABOLIC PANEL WITH GFR
ALT: 10 U/L (ref 0–44)
AST: 15 U/L (ref 15–41)
Albumin: 3.5 g/dL (ref 3.5–5.0)
Alkaline Phosphatase: 53 U/L (ref 38–126)
Anion gap: 7 (ref 5–15)
BUN: 13 mg/dL (ref 8–23)
CO2: 24 mmol/L (ref 22–32)
Calcium: 8.8 mg/dL — ABNORMAL LOW (ref 8.9–10.3)
Chloride: 110 mmol/L (ref 98–111)
Creatinine, Ser: 0.84 mg/dL (ref 0.61–1.24)
GFR, Estimated: >60 mL/min (ref >60)
Glucose, Bld: 113 mg/dL — ABNORMAL HIGH (ref 70–99)
Potassium: 4.7 mmol/L (ref 3.5–5.1)
Sodium: 141 mmol/L (ref 135–145)
Total Bilirubin: 0.2 mg/dL (ref 0.0–1.2)
Total Protein: 5.7 g/dL — ABNORMAL LOW (ref 6.5–8.1)

## 2024-08-08 LAB — CBC
HCT: 40.2 % (ref 39.0–52.0)
Hemoglobin: 12.7 g/dL — ABNORMAL LOW (ref 13.0–17.0)
MCH: 22 pg — ABNORMAL LOW (ref 26.0–34.0)
MCHC: 31.6 g/dL (ref 30.0–36.0)
MCV: 69.8 fL — ABNORMAL LOW (ref 80.0–100.0)
Platelets: 179 K/uL (ref 150–400)
RBC: 5.76 MIL/uL (ref 4.22–5.81)
RDW: 17.8 % — ABNORMAL HIGH (ref 11.5–15.5)
WBC: 3.7 K/uL — ABNORMAL LOW (ref 4.0–10.5)
nRBC: 0 % (ref 0.0–0.2)

## 2024-08-08 MED ORDER — TRAZODONE HCL 50 MG PO TABS
150.0000 mg | ORAL_TABLET | Freq: Every day | ORAL | Status: DC
  Administered 2024-08-08 – 2024-08-12 (×5): 150 mg via ORAL
  Filled 2024-08-08 (×5): qty 1

## 2024-08-08 NOTE — TOC Progression Note (Signed)
 Transition of Care [facility]) - Progression Note    Patient Details  Name: Joe Hardy MRN: [id number] Date of Birth: 1940-11-10  [facility] of [facility]) CM/SW Contact  Alvaro Louder, [place] Phone Number: 08/08/2024, 4:23 PM  Clinical Narrative:   Per chart review patient is from home PCP is Marsa Officer LCSWA faxed out patient to SNF's in [place]. TOC to present facilities to patient at the bedside.  TOC to follow for discharge                    Expected Discharge Plan and Services                                               Social Drivers of Health (SDOH) Interventions SDOH Screenings   Food Insecurity: No Food Insecurity (08/07/2024)  Housing: Low Risk  (08/07/2024)  Transportation Needs: No Transportation Needs (08/07/2024)  Utilities: Not At Risk (08/07/2024)  Alcohol Screen: Low Risk  (09/30/2023)  Depression (PHQ2-9): Low Risk  (09/30/2023)  Financial Resource Strain: Low Risk  (04/26/2024)   Received from [facility]  Physical Activity: Insufficiently Active (09/30/2023)  Social Connections: Socially Isolated (08/07/2024)  Stress: No Stress Concern Present (09/30/2023)  Tobacco Use: Medium Risk (08/07/2024)  Health Literacy: Adequate Health Literacy (09/30/2023)    Readmission Risk Interventions     No data to display

## 2024-08-08 NOTE — Care Management Obs Status (Signed)
 MEDICARE OBSERVATION STATUS NOTIFICATION   Patient Details  Name: Joe Hardy MRN: [id number] Date of Birth: 06/19/1941   Medicare Observation Status Notification Given:  Yes    [name] 08/08/2024, 12:48 PM

## 2024-08-08 NOTE — NC FL2 (Signed)
 [place]  MEDICAID [id number] LEVEL OF CARE FORM     IDENTIFICATION  Patient Name: Joe Hardy Birthdate: 1941-02-21 Sex: male Admission Date (Current Location): 08/07/2024  [facility] and [place] Number:  Chiropodist and Address:  [facility], [address] [place], [place] [address]      Provider Number: [id number]  Attending Physician Name and Address:  Laurita Pillion, MD  Relative Name and Phone Number:       Current Level of Care: Hospital Recommended Level of Care: Skilled Nursing Facility Prior Approval Number:    Date Approved/Denied:   PASRR Number: [id number]  Discharge Plan: Home    Current Diagnoses: Patient Active Problem List   Diagnosis Date Noted   Hypotension 08/08/2024   Generalized weakness 08/08/2024   Intoxication by drug (HCC) 08/07/2024   Chronic neck pain 09/08/2023   Suicidal ideation 01/17/2023   Spinal stenosis 12/11/2022   Urothelial carcinoma (HCC) 12/07/2022   Iron  deficiency anemia secondary to inadequate dietary iron  intake 06/03/2022   Overweight with body mass index (BMI) of 26 to 26.9 in adult 03/09/2021   Constipation due to slow transit 05/02/2018   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   Depression, major, recurrent, in partial remission 04/14/2015   Arthritis, degenerative 04/14/2015   Mixed hyperlipidemia 04/14/2015   Essential hypertension 04/14/2015   Hypothyroidism 04/14/2015   Insomnia 04/14/2015   Headache, migraine 04/14/2015    Orientation RESPIRATION BLADDER Height & Weight     Self, Time, Situation, Place  Normal Continent Weight: 175 lb 4.3 oz (79.5 kg) Height:  5' 9 (175.3 cm)  BEHAVIORAL SYMPTOMS/MOOD NEUROLOGICAL BOWEL NUTRITION STATUS      Continent Diet (Regular)  AMBULATORY STATUS COMMUNICATION OF NEEDS Skin   Limited Assist Verbally Normal                       Personal Care Assistance Level of Assistance  Bathing, Feeding, Dressing  Bathing Assistance: Limited assistance Feeding assistance: Independent Dressing Assistance: Limited assistance     Functional Limitations Info  Sight, Hearing, Speech Sight Info: Impaired Hearing Info: Impaired Speech Info: Adequate    SPECIAL CARE FACTORS FREQUENCY  PT (By licensed PT), OT (By licensed OT)     PT Frequency: 5x/week OT Frequency: 5x/week            Contractures      Additional Factors Info  Code Status, Allergies Code Status Info: Full Allergies Info: Fluoxetine, Venlafaxine           Current Medications (08/08/2024):  This is the current hospital active medication list Current Facility-Administered Medications  Medication Dose Route Frequency Provider Last Rate Last Admin   acetaminophen  (TYLENOL ) tablet 650 mg  650 mg Oral Q6H PRN Lenon Marien CROME, MD       Or   acetaminophen  (TYLENOL ) suppository 650 mg  650 mg Rectal Q6H PRN Lenon Marien CROME, MD       albuterol  (PROVENTIL ) (2.5 MG/3ML) 0.083% nebulizer solution 2.5 mg  2.5 mg Nebulization Q2H PRN Lenon Marien CROME, MD       atorvastatin  (LIPITOR) tablet 20 mg  20 mg Oral Daily Lenon Marien L, MD   20 mg at 08/08/24 0901   bisacodyl  (DULCOLAX) EC tablet 5 mg  5 mg Oral Daily PRN Lenon Marien CROME, MD       buPROPion  (WELLBUTRIN  XL) 24 hr tablet 450 mg  450 mg Oral Daily Lenon Marien CROME, MD   450 mg at  08/08/24 0900   busPIRone  (BUSPAR ) tablet 10 mg  10 mg Oral BID Lenon Marien CROME, MD   10 mg at 08/08/24 0901   enoxaparin  (LOVENOX ) injection 40 mg  40 mg Subcutaneous Q24H Lenon Marien CROME, MD   40 mg at 08/08/24 0901   feeding supplement (ENSURE PLUS HIGH PROTEIN) liquid 237 mL  237 mL Oral BID BM Lenon Marien CROME, MD   237 mL at 08/08/24 1459   levothyroxine  (SYNTHROID ) tablet 75 mcg  75 mcg Oral QAC breakfast Lenon Marien CROME, MD   75 mcg at 08/08/24 0521   mirtazapine  (REMERON ) tablet 30 mg  30 mg Oral QHS Lenon Marien CROME, MD   30 mg at 08/07/24 2004    ondansetron  (ZOFRAN ) tablet 4 mg  4 mg Oral Q6H PRN Lenon Marien CROME, MD       Or   ondansetron  (ZOFRAN ) injection 4 mg  4 mg Intravenous Q6H PRN Anderson, Chelsey L, MD       polyethylene glycol (MIRALAX  / GLYCOLAX ) packet 17 g  17 g Oral Daily Lenon Marien CROME, MD   17 g at 08/08/24 0901   traZODone  (DESYREL ) tablet 50 mg  50 mg Oral QHS Lenon Marien CROME, MD   50 mg at 08/07/24 2005     Discharge Medications: Please see discharge summary for a list of discharge medications.  Relevant Imaging Results:  Relevant Lab Results:   Additional Information SSN: [id number]  Alvaro Louder, LCSW

## 2024-08-08 NOTE — Progress Notes (Signed)
  Progress Note   Patient: Joe Hardy FMW:[id number] DOB: 03/29/1941 DOA: 08/07/2024     0 DOS: the patient was seen and examined on 08/08/2024   Brief hospital course: Joe Hardy is a 83 y.o. male with a PMH significant for HTN, HLD, bladder cancer.   They presented from home to the ED on 08/07/2024 with dry mouth and feeling groggy after trying a delta 8 gummy for the first time.   He received the fluids, condition has improved.  However, patient was seen by PT/OT, has significant weakness.  Recommended nursing home placement.   Principal Problem:   Intoxication by drug (HCC)   Assessment and Plan: ntoxication by delta 8- groggy but easy to arouse and is oriented. No focal findings. Condition has improved today.  General weakness. Seen by PT/OT, recommended nursing home placement.   Hypotension- possible related to poor PO intake and dehydration. Blood pressure has normalized. Patient has an abnormal urine, but no urinary symptoms.   Microcytic anemia-appears to be stable at his baseline. May contribute to his fatigue.  Hemoglobin today 12.7, no iron  deficiency.   Depression- History of depression but denies suicidal ideation currently. - Continue home Wellbutrin  and BuSpar    Hypothyroidism-continue home levothyroxine         Subjective:  Patient complaining weakness, could not walk.  This has been happening in the past.  Physical Exam: Vitals:   08/07/24 1721 08/07/24 1822 08/08/24 0042 08/08/24 0801  BP: 97/67 108/67 97/81 (!) 150/96  Pulse:  87 68 95  Resp:  16 17 19   Temp:  98.6 F (37 C) 98.1 F (36.7 C) 97.8 F (36.6 C)  TempSrc:  Oral Oral   SpO2:  100% 98% 99%  Weight:      Height:       General exam: Appears calm and comfortable  Respiratory system: Clear to auscultation. Respiratory effort normal. Cardiovascular system: S1 & S2 heard, RRR. No JVD, murmurs, rubs, gallops or clicks. No pedal edema. Gastrointestinal system: Abdomen is  nondistended, soft and nontender. No organomegaly or masses felt. Normal bowel sounds heard. Central nervous system: Alert and oriented. No focal neurological deficits. Extremities: Symmetric 5 x 5 power. Skin: No rashes, lesions or ulcers Psychiatry: Judgement and insight appear normal. Mood & affect appropriate. '   Data Reviewed:  Lab results reviewed. Family Communication: None  Disposition: Status is: Observation      Time spent: 35 minutes  Author: Murvin Mana, MD 08/08/2024 12:47 PM  For on call review [email or web address].

## 2024-08-08 NOTE — Evaluation (Signed)
 Physical Therapy Evaluation Patient Details Name: Joe Hardy MRN: [id number] DOB: 1940-12-06 Today's Date: 08/08/2024  History of Present Illness  Mahmoud Blazejewski is an 83yoM who comes in [facility] on 08/07/24 via EMS after accidentally ingesting desired dose of new Delta 8 gummies. Gummies new to patient. Pt c/o dry mouth. Pt drowsy and hypotensive on arrival. Pt report 3 falls in preceding 10 days all associated with transiet weakness and inability to continue walking, typically can tolerate AMB with device for [employer] trips.  Clinical Impression  Pt feeling somewhat more alter today, but remains weak in legs, limited standing and walking tolerance, unable to achieve household distance AMB in contrast to baseline tolerance of walking [employer] pushing a buggy. Author concerned about pt ability to be safe at home in current state, he agrees. Pt partakes in standing balance activity, then after a break attempts AMB at bedside. Pt endorses weakness in bilat legs. Anticipate pt would need a brief period of rehab prior to return to safe mobility levels for return to home alone. Will continue to follow.       If plan is discharge home, recommend the following: A lot of help with walking and/or transfers;A lot of help with bathing/dressing/bathroom;Help with stairs or ramp for entrance;Assist for transportation;Assistance with cooking/housework   Can travel by private vehicle   Yes    Equipment Recommendations None recommended by PT  Recommendations for Other Services       Functional Status Assessment Patient has had a recent decline in their functional status and demonstrates the ability to make significant improvements in function in a reasonable and predictable amount of time.     Precautions / Restrictions Precautions Precautions: Fall Recall of Precautions/Restrictions: Impaired Restrictions Weight Bearing Restrictions Per Provider Order: No      Mobility  Bed Mobility Overal  bed mobility: Needs Assistance Bed Mobility: Supine to Sit, Sit to Supine     Supine to sit: Supervision Sit to supine: Supervision        Transfers Overall transfer level: Needs assistance Equipment used: Rolling walker (2 wheels) Transfers: Sit to/from Stand Sit to Stand: Supervision           General transfer comment: heavy effort, steady once up; no gross LOB    Ambulation/Gait Ambulation/Gait assistance: Contact guard assist Gait Distance (Feet): 24 Feet Assistive device: Rolling walker (2 wheels) Gait Pattern/deviations: Step-to pattern     Pre-gait activities: marching in place x30sec c RW General Gait Details: impaired A/P trunk control,subjective weakness of calves bilat, feel sunsafe, unable to advance distnace  Stairs            Wheelchair Mobility     Tilt Bed    Modified Rankin (Stroke Patients Only)       Balance Overall balance assessment: Needs assistance, History of Falls                                           Pertinent Vitals/Pain Pain Assessment Pain Assessment:  (Rt chronic neck pain 7/10)    Home Living Family/patient expects to be discharged to:: Private residence Living Arrangements: Alone Available Help at Discharge: Friend(s) Type of Home: Apartment Home Access: Level entry       Home Layout: One level Home Equipment: Rollator (4 wheels);Cane - single point Additional Comments: 3 falls in the past week.    Prior Function  Extremity/Trunk Assessment                Veterinary Surgeon Comments      Exercises Other Exercises Other Exercises: static stance at bedside c RW; marching in place at bedside x30sec Other Exercises: seated BP assessment: Stg 2 HTN Other Exercises: alternate forward/backward AMB bed to windill x3 (24 total ft)   Assessment/Plan     PT Assessment Patient needs continued PT services  PT Problem List Decreased strength;Decreased activity tolerance;Decreased balance;Decreased mobility;Decreased range of motion;Decreased cognition;Decreased safety awareness       PT Treatment Interventions Therapeutic exercise;Balance training;Gait training;Stair training;DME instruction;Functional mobility training;Therapeutic activities;Neuromuscular re-education;Patient/family education    PT Goals (Current goals can be found in the Care Plan section)  Acute Rehab PT Goals Patient Stated Goal: regain baselinbe walking strength PT Goal Formulation: With patient Time For Goal Achievement: 08/22/24 Potential to Achieve Goals: Good    Frequency Min 2X/week     Co-evaluation               AM-PAC PT 6 Clicks Mobility  Outcome Measure Help needed turning from your back to your side while in a flat bed without using bedrails?: A Little Help needed moving from lying on your back to sitting on the side of a flat bed without using bedrails?: A Little Help needed moving to and from a bed to a chair (including a wheelchair)?: A Little Help needed standing up from a chair using your arms (e.g., wheelchair or bedside chair)?: A Little Help needed to walk in hospital room?: A Lot Help needed climbing 3-5 steps with a railing? : A Lot 6 Click Score: 16    End of Session Equipment Utilized During Treatment: Gait belt Activity Tolerance: Patient tolerated treatment well;Patient limited by fatigue;No increased pain Patient left: in bed;with call bell/phone within reach;with bed alarm set Nurse Communication: Mobility status PT Visit Diagnosis: Unsteadiness on feet (R26.81);History of falling (Z91.81);Other abnormalities of gait and mobility (R26.89);Difficulty in walking, not elsewhere classified (R26.2)    Time: 8964-8941 PT Time Calculation (min) (ACUTE ONLY): 23 min   Charges:   PT Evaluation $PT Eval Moderate Complexity: 1  Mod PT Treatments $Therapeutic Activity: 8-22 mins PT General Charges $$ ACUTE PT VISIT: 1 Visit    12:52 PM, 08/08/24 Peggye JAYSON Linear, PT, DPT Physical Therapist - [facility]  [phone] (ASCOM)    Melitza Metheny C 08/08/2024, 12:27 PM

## 2024-08-08 NOTE — Hospital Course (Signed)
 Joe Hardy is a 83 y.o. male with a PMH significant for HTN, HLD, bladder cancer.   They presented from home to the ED on 08/07/2024 with dry mouth and feeling groggy after trying a delta 8 gummy for the first time.   He received the fluids, condition has improved.  However, patient was seen by PT/OT, has significant weakness.  Recommended nursing home placement.

## 2024-08-08 NOTE — Evaluation (Signed)
 Occupational Therapy Evaluation Patient Details Name: Devynn Scheff MRN: [id number] DOB: 1940-11-16 Today's Date: 08/08/2024   History of Present Illness   Rudolfo Brandow is an 83yoM who comes in to [facility] on 08/07/24 via EMS after accidentally ingesting desired dose of new Delta 8 gummies. Gummies new to patient. Pt c/o dry mouth. Pt drowsy and hypotensive on arrival. Pt report 3 falls in preceding 10 days all associated with transiet weakness and inability to continue walking, typically can tolerate AMB with device for [employer] trips.     Clinical Impressions Patient was seen for OT evaluation this date. Patient poor historian and tangental during OT eval, unsure of accuracy regarding PLOF/baseline. Prior to hospital admission, patient reports he was independent with ADLs/IADLs but then also reports he would occasionally have to call someone to help get me in bed. He lives in a first floor apartment but has limited family support. He uses a rollator in home. Patient requires mod verbal cues to maintain attention to task and to initiate/complete tasks. Patient limited throughout tx, highly emotional and crying but unable to express why but hyperfixated on sarcopenia and tart cherry juice?  Paient would benefit from skilled OT services to address noted impairments and functional limitations (see below for any additional details) in order to maximize safety and independence while minimizing future risk of falls, injury, and readmission. Anticipate the need for follow up OT services upon acute hospital DC.      If plan is discharge home, recommend the following:   A little help with walking and/or transfers;A little help with bathing/dressing/bathroom;Direct supervision/assist for medications management;Assistance with cooking/housework;Supervision due to cognitive status     Functional Status Assessment   Patient has had a recent decline in their functional status and demonstrates the  ability to make significant improvements in function in a reasonable and predictable amount of time.     Equipment Recommendations   None recommended by OT     Recommendations for Other Services         Precautions/Restrictions   Precautions Precautions: Fall Recall of Precautions/Restrictions: Impaired Restrictions Weight Bearing Restrictions Per Provider Order: No     Mobility Bed Mobility                    Transfers                          Balance                                           ADL either performed or assessed with clinical judgement   ADL                                               Vision         Perception         Praxis         Pertinent Vitals/Pain       Extremity/Trunk Assessment Upper Extremity Assessment Upper Extremity Assessment: Overall WFL for tasks assessed   Lower Extremity Assessment Lower Extremity Assessment: Defer to PT evaluation       Communication Communication Communication: No apparent difficulties   Cognition Arousal: Alert Behavior During Therapy: Anxious Cognition: Difficult  to assess             OT - Cognition Comments: unable to discern cognitive deficits compared to intoxication                 Following commands: Impaired Following commands impaired: Follows one step commands with increased time     Cueing  General Comments   Cueing Techniques: Verbal cues;Tactile cues      Exercises     Shoulder Instructions      Home Living Family/patient expects to be discharged to:: Private residence Living Arrangements: Alone Available Help at Discharge: Friend(s) Type of Home: Apartment Home Access: Level entry     Home Layout: One level     Bathroom Shower/Tub: Tub/shower unit         Home Equipment: Rollator (4 wheels);Cane - single point   Additional Comments: 3 falls in the past week.      Prior  Functioning/Environment                      OT Problem List: Decreased strength;Decreased activity tolerance;Impaired balance (sitting and/or standing);Decreased safety awareness   OT Treatment/Interventions: Self-care/ADL training;Therapeutic exercise;Energy conservation;DME and/or AE instruction;Patient/family education;Balance training      OT Goals(Current goals can be found in the care plan section)   Acute Rehab OT Goals Patient Stated Goal: to go home OT Goal Formulation: With patient Time For Goal Achievement: 08/22/24 Potential to Achieve Goals: Good ADL Goals Pt Will Perform Grooming: with modified independence;standing Pt Will Perform Lower Body Dressing: with modified independence;sit to/from stand Pt Will Transfer to Toilet: with modified independence;ambulating;regular height toilet Pt Will Perform Toileting - Clothing Manipulation and hygiene: with modified independence;sit to/from stand   OT Frequency:  Min 2X/week    Co-evaluation              AM-PAC OT 6 Clicks Daily Activity     Outcome Measure Help from another person eating meals?: None Help from another person taking care of personal grooming?: A Little Help from another person toileting, which includes using toliet, bedpan, or urinal?: A Little Help from another person bathing (including washing, rinsing, drying)?: A Little Help from another person to put on and taking off regular upper body clothing?: A Little Help from another person to put on and taking off regular lower body clothing?: A Little 6 Click Score: 19   End of Session    Activity Tolerance: Patient tolerated treatment well Patient left: in chair;with call bell/phone within reach;with chair alarm set  OT Visit Diagnosis: Unsteadiness on feet (R26.81);Muscle weakness (generalized) (M62.81)                Time: 8843-8780 OT Time Calculation (min): 23 min Charges:  OT General Charges $OT Visit: 1 Visit OT  Evaluation $OT Eval Low Complexity: 1 Low OT Treatments $Self Care/Home Management : 8-22 mins  Rogers Clause, OT/L MSOT, 08/08/2024

## 2024-08-08 NOTE — Progress Notes (Signed)
 Mobility Specialist - Progress Note   08/08/24 1411  Mobility  Activity Ambulated with assistance  Level of Assistance Contact guard assist, steadying assist  Assistive Device Front wheel walker  Distance Ambulated (ft) 24 ft  Activity Response Tolerated well  Mobility visit 1 Mobility  Mobility Specialist Start Time (ACUTE ONLY) 1349  Mobility Specialist Stop Time (ACUTE ONLY) 1404  Mobility Specialist Time Calculation (min) (ACUTE ONLY) 15 min   Pt sitting in the recliner upon entry, utilizing RA. Pt STS to RW Mod-MinA--- BLE shaky/trembling upon standing. Pt amb to/from the door within the room CGA, tolerated well. Pt returned to the recliner, left seated with alarm set and needs within reach.  America Silvan Mobility Specialist 08/08/24 2:26 PM

## 2024-08-08 NOTE — Plan of Care (Signed)

## 2024-08-09 DIAGNOSIS — I959 Hypotension, unspecified: Secondary | ICD-10-CM | POA: Diagnosis not present

## 2024-08-09 DIAGNOSIS — F19922 Other psychoactive substance use, unspecified with intoxication with perceptual disturbance: Secondary | ICD-10-CM | POA: Diagnosis not present

## 2024-08-09 DIAGNOSIS — R531 Weakness: Secondary | ICD-10-CM | POA: Diagnosis not present

## 2024-08-09 NOTE — Plan of Care (Signed)
   Problem: Clinical Measurements: Goal: Diagnostic test results will improve Outcome: Progressing   Problem: Activity: Goal: Risk for activity intolerance will decrease Outcome: Progressing   Problem: Coping: Goal: Level of anxiety will decrease Outcome: Progressing   Problem: Safety: Goal: Ability to remain free from injury will improve Outcome: Progressing

## 2024-08-09 NOTE — Progress Notes (Signed)
  Progress Note   Patient: Joe Hardy FMW:[id number] DOB: 1941-04-04 DOA: 08/07/2024     0 DOS: the patient was seen and examined on 08/09/2024   Brief hospital course: [name] is a 83 y.o. male with a PMH significant for HTN, HLD, bladder cancer.   They presented from home to the ED on 08/07/2024 with dry mouth and feeling groggy after trying a delta 8 gummy for the first time.   He received the fluids, condition has improved.  However, patient was seen by PT/OT, has significant weakness.  Recommended nursing home placement.   Principal Problem:   Intoxication by drug [facility]) Active Problems:   Hypotension   Generalized weakness   Assessment and Plan: Intoxication by delta 8- groggy but easy to arouse and is oriented. No focal findings. Condition has improved.   General weakness. Seen by PT/OT, recommended nursing home placement.   Hypotension- possible related to poor PO intake and dehydration. Blood pressure has normalized. Patient has an abnormal urine, but no urinary symptoms.   Microcytic anemia-appears to be stable at his baseline. May contribute to his fatigue.  Hemoglobin today 12.7, no iron  deficiency.   Depression- Continue home regimen.   Hypothyroidism Continue home treatment.      Subjective:  Patient doing well today, still significant weakness.  Physical Exam: Vitals:   08/08/24 1537 08/08/24 1941 08/09/24 0454 08/09/24 0748  BP: (!) 146/86 (!) 151/97 (!) 150/96 (!) 165/94  Pulse: 93 83 96 90  Resp: 19 17 18 19   Temp: 98.1 F (36.7 C) 98.4 F (36.9 C) 98 F (36.7 C) 98.2 F (36.8 C)  TempSrc:  Oral    SpO2: 100% 98% 95% 96%  Weight:      Height:       General exam: Appears calm and comfortable  Respiratory system: Clear to auscultation. Respiratory effort normal. Cardiovascular system: S1 & S2 heard, RRR. No JVD, murmurs, rubs, gallops or clicks. No pedal edema. Gastrointestinal system: Abdomen is nondistended, soft and  nontender. No organomegaly or masses felt. Normal bowel sounds heard. Central nervous system: Alert and oriented. No focal neurological deficits. Extremities: Symmetric 5 x 5 power. Skin: No rashes, lesions or ulcers Psychiatry: Judgement and insight appear normal. Mood & affect appropriate.    Data Reviewed:  There are no new results to review at this time.  Family Communication: None  Disposition: Status is: Observation Pending nursing home placement.     Time spent: 35 minutes  Author: Murvin Mana, MD 08/09/2024 11:38 AM  For on call review [email or web address].

## 2024-08-09 NOTE — Plan of Care (Signed)
  Problem: Pain Managment: Goal: General experience of comfort will improve and/or be controlled Outcome: Progressing   Problem: Safety: Goal: Ability to remain free from injury will improve Outcome: Progressing   Problem: Skin Integrity: Goal: Risk for impaired skin integrity will decrease Outcome: Progressing

## 2024-08-10 DIAGNOSIS — I959 Hypotension, unspecified: Secondary | ICD-10-CM | POA: Diagnosis not present

## 2024-08-10 DIAGNOSIS — I10 Essential (primary) hypertension: Secondary | ICD-10-CM

## 2024-08-10 DIAGNOSIS — F19922 Other psychoactive substance use, unspecified with intoxication with perceptual disturbance: Secondary | ICD-10-CM | POA: Diagnosis not present

## 2024-08-10 MED ORDER — LACTULOSE 10 GM/15ML PO SOLN
20.0000 g | Freq: Once | ORAL | Status: AC
  Administered 2024-08-10: 20 g via ORAL
  Filled 2024-08-10: qty 30

## 2024-08-10 MED ORDER — LISINOPRIL 10 MG PO TABS
10.0000 mg | ORAL_TABLET | Freq: Every day | ORAL | Status: DC
  Administered 2024-08-10 – 2024-08-13 (×4): 10 mg via ORAL
  Filled 2024-08-10 (×4): qty 1

## 2024-08-10 MED ORDER — TERAZOSIN HCL 5 MG PO CAPS
5.0000 mg | ORAL_CAPSULE | Freq: Every day | ORAL | Status: DC
  Administered 2024-08-10 – 2024-08-12 (×3): 5 mg via ORAL
  Filled 2024-08-10 (×4): qty 1

## 2024-08-10 NOTE — Progress Notes (Signed)
 Mobility Specialist - Progress Note   08/10/24 1600  Mobility  Activity Ambulated with assistance;Respositioned in chair  Level of Assistance Minimal assist, patient does 75% or more  Assistive Device Front wheel walker  Distance Ambulated (ft) 15 ft  Range of Motion/Exercises Active;All extremities  Activity Response Tolerated well  Mobility visit 1 Mobility  Mobility Specialist Start Time (ACUTE ONLY) 1510  Mobility Specialist Stop Time (ACUTE ONLY) 1527  Mobility Specialist Time Calculation (min) (ACUTE ONLY) 17 min   Pt was in the recliner on RA upon entry. Pt agreed to mobility. Pt stated that he has random neck aches and leg instability. Pt is able today to STS with modA CGA. Pt was able to ambulate better today with activity before ambulation. After ambulation and activity pt repositioned in the recliner with needs in reach and chair alarm on.  Clem Rodes Mobility Specialist 08/10/24, 4:38 PM

## 2024-08-10 NOTE — Plan of Care (Signed)
  Problem: Safety: Goal: Ability to remain free from injury will improve Outcome: Progressing   Problem: Elimination: Goal: Will not experience complications related to bowel motility Outcome: Progressing   Problem: Nutrition: Goal: Adequate nutrition will be maintained Outcome: Progressing   Problem: Activity: Goal: Risk for activity intolerance will decrease Outcome: Progressing

## 2024-08-10 NOTE — TOC Progression Note (Addendum)
 Transition of Care [facility]) - Progression Note    Patient Details  Name: Joe Hardy MRN: [id number] Date of Birth: 21-Apr-1941  Transition of Care [facility]) CM/SW Contact  Lauraine JAYSON Carpen, LCSW Phone Number: 08/10/2024, 12:15 PM  Clinical Narrative:   Gave bed offers. [facility] is first preference but they have not responded yet. Admissions coordinator is aware and will review.  4:27 pm: [facility] declined. Left messages for [employer] and [employer] asking them to review.  4:38 pm: Patient has accepted bed offer from [facility]. Admissions coordinator is aware. They might not have a bed until Monday but will follow up with TOC if anything opens up this weekend. MD is aware. CSW started auth.  Expected Discharge Plan and Services                                               Social Drivers of Health (SDOH) Interventions SDOH Screenings   Food Insecurity: No Food Insecurity (08/07/2024)  Housing: Low Risk  (08/07/2024)  Transportation Needs: No Transportation Needs (08/07/2024)  Utilities: Not At Risk (08/07/2024)  Alcohol Screen: Low Risk  (09/30/2023)  Depression (PHQ2-9): Low Risk  (09/30/2023)  Financial Resource Strain: Low Risk  (04/26/2024)   Received from [facility]  Physical Activity: Insufficiently Active (09/30/2023)  Social Connections: Socially Isolated (08/07/2024)  Stress: No Stress Concern Present (09/30/2023)  Tobacco Use: Medium Risk (08/07/2024)  Health Literacy: Adequate Health Literacy (09/30/2023)    Readmission Risk Interventions     No data to display

## 2024-08-10 NOTE — Progress Notes (Signed)
  Progress Note   Patient: Joe Hardy FMW:[id number] DOB: 1940-10-16 DOA: 08/07/2024     0 DOS: the patient was seen and examined on 08/10/2024   Brief hospital course: Joe Hardy is a 83 y.o. male with a PMH significant for HTN, HLD, bladder cancer.   They presented from home to the ED on 08/07/2024 with dry mouth and feeling groggy after trying a delta 8 gummy for the first time.   He received the fluids, condition has improved.  However, patient was seen by PT/OT, has significant weakness.  Recommended nursing home placement.   Principal Problem:   Intoxication by drug [facility]) Active Problems:   Hypotension   Generalized weakness   Assessment and Plan: Intoxication by delta 8- groggy but easy to arouse and is oriented. No focal findings. Condition has improved.   General weakness. Still has significant weakness, discussed with TOC, looking for nursing placement.   Hypotension Essential hypertension. Hypotension has resolved, blood pressure running high again.  Restart lisinopril  and terazosin .  Continue hold hydrochlorothiazide .   Microcytic anemia Anemia of chronic disease. Patient has mild low iron , but a significant elevated ferritin level.  Consistent with anemia of chronic disease.   Depression- Continue home regimen.   Hypothyroidism Continue home treatment.      Subjective:  Patient still feels very weak, not able to walk.  Physical Exam: Vitals:   08/10/24 0412 08/10/24 0421 08/10/24 0423 08/10/24 0823  BP: (!) 178/102 (!) 161/100 (!) 156/98 (!) 146/94  Pulse: 95 88 88 95  Resp: 20   16  Temp: 97.9 F (36.6 C)   98.2 F (36.8 C)  TempSrc: Oral     SpO2: 97%   98%  Weight:      Height:       General exam: Appears calm and comfortable  Respiratory system: Clear to auscultation. Respiratory effort normal. Cardiovascular system: S1 & S2 heard, RRR. No JVD, murmurs, rubs, gallops or clicks. No pedal edema. Gastrointestinal system: Abdomen  is nondistended, soft and nontender. No organomegaly or masses felt. Normal bowel sounds heard. Central nervous system: Alert and oriented. No focal neurological deficits. Extremities: Symmetric 5 x 5 power. Skin: No rashes, lesions or ulcers Psychiatry: Judgement and insight appear normal. Mood & affect appropriate.    Data Reviewed:  Lab results reviewed.  Family Communication: None  Disposition: Status is: Observation Pending nursing home placement.     Time spent: 35 minutes  Author: Murvin Mana, MD 08/10/2024 11:49 AM  For on call review [email or web address].

## 2024-08-10 NOTE — Progress Notes (Signed)
 Physical Therapy Treatment Patient Details Name: Joe Hardy MRN: [id number] DOB: Sep 11, 1941 Today's Date: 08/10/2024   History of Present Illness Joe Hardy is an 83yoM who comes in to [facility] on 08/07/24 via EMS after accidentally ingesting desired dose of new Delta 8 gummies. Gummies new to patient. Pt c/o dry mouth. Pt drowsy and hypotensive on arrival. Pt report 3 falls in preceding 10 days all associated with transiet weakness and inability to continue walking, typically can tolerate AMB with device for [employer] trips.    PT Comments  Noted improvement in activity tolerance, gait distance and overall functional indep this date; completing full lap around nursing station (200') with RW, min assist.  Continues to demonstrate tremulous movement with pre-sit/stand movements, but tremors fully resolve with WBing and transition into standing.  Do continue to recommend +1 for optimal safety with all mobility at this time; DC recs remain appropriate.     If plan is discharge home, recommend the following: A little help with walking and/or transfers;A little help with bathing/dressing/bathroom   Can travel by private vehicle     Yes  Equipment Recommendations  None recommended by PT    Recommendations for Other Services       Precautions / Restrictions Precautions Precautions: Fall Restrictions Weight Bearing Restrictions Per Provider Order: No     Mobility  Bed Mobility Overal bed mobility: Needs Assistance Bed Mobility: Supine to Sit     Supine to sit: Min assist          Transfers Overall transfer level: Needs assistance Equipment used: Rolling walker (2 wheels) Transfers: Sit to/from Stand, Bed to chair/wheelchair/BSC Sit to Stand: Min assist Stand pivot transfers: Min assist         General transfer comment: cuing for hand placement and scooting towards edge of seating surface with transfer.  Onset of LE tremors with pre sit/stand, but resolves with WBing  and transition to standing.    Ambulation/Gait   Gait Distance (Feet): 200 Feet Assistive device: Rolling walker (2 wheels)         General Gait Details: forward trunk flexion, maintaining RW arms length anterior to patient; partially reciprocal stepping pattern with fair step height/length. Mildly excessive weight shift to R with gait efforts, but no overt buckling or LOB this date.   Stairs             Wheelchair Mobility     Tilt Bed    Modified Rankin (Stroke Patients Only)       Balance Overall balance assessment: Needs assistance Sitting-balance support: No upper extremity supported, Feet supported Sitting balance-Leahy Scale: Good     Standing balance support: Bilateral upper extremity supported Standing balance-Leahy Scale: Fair                              Hotel Manager: No apparent difficulties  Cognition Arousal: Alert Behavior During Therapy: WFL for tasks assessed/performed   PT - Cognitive impairments: No apparent impairments                       PT - Cognition Comments: generally hyperverbal, cuing for focus/attention to task   Following commands impaired: Only follows one step commands consistently    Cueing Cueing Techniques: Verbal cues, Tactile cues  Exercises Other Exercises Other Exercises: Toilet transfer, SPT with RW, min assist; sit/stand from [facility] with RW, min assist.  Unsuccessful BM; did encourage continued use of  BSC for future toileting efforts.  Patient voiced understanding and agreement.    General Comments        Pertinent Vitals/Pain Pain Assessment Pain Assessment: No/denies pain    Home Living                          Prior Function            PT Goals (current goals can now be found in the care plan section) Acute Rehab PT Goals Patient Stated Goal: regain baselinbe walking strength PT Goal Formulation: With patient Time For Goal Achievement:  08/22/24 Potential to Achieve Goals: Good Progress towards PT goals: Progressing toward goals    Frequency    Min 2X/week      PT Plan      Co-evaluation              AM-PAC PT 6 Clicks Mobility   Outcome Measure  Help needed turning from your back to your side while in a flat bed without using bedrails?: A Little Help needed moving from lying on your back to sitting on the side of a flat bed without using bedrails?: A Little Help needed moving to and from a bed to a chair (including a wheelchair)?: A Little Help needed standing up from a chair using your arms (e.g., wheelchair or bedside chair)?: A Little Help needed to walk in hospital room?: A Little Help needed climbing 3-5 steps with a railing? : A Lot 6 Click Score: 17    End of Session Equipment Utilized During Treatment: Gait belt Activity Tolerance: Patient tolerated treatment well Patient left: in chair;with chair alarm set;with call bell/phone within reach Nurse Communication: Mobility status PT Visit Diagnosis: Unsteadiness on feet (R26.81);History of falling (Z91.81);Other abnormalities of gait and mobility (R26.89);Difficulty in walking, not elsewhere classified (R26.2)     Time: 8980-8950 PT Time Calculation (min) (ACUTE ONLY): 30 min  Charges:    $Gait Training: 8-22 mins $Therapeutic Activity: 8-22 mins PT General Charges $$ ACUTE PT VISIT: 1 Visit                     Joe Hardy H. Delores, PT, DPT, NCS 08/10/24, 11:05 AM [phone]

## 2024-08-10 NOTE — Progress Notes (Signed)
 Mobility Specialist - Progress Note    08/10/24 1400  Mobility  Activity Respositioned in chair  Level of Assistance Contact guard assist, steadying assist  Assistive Device Front wheel walker  Distance Ambulated (ft) 8 ft  Range of Motion/Exercises Active;All extremities  Activity Response Tolerated fair  Mobility visit 1 Mobility  Mobility Specialist Start Time (ACUTE ONLY) 1256  Mobility Specialist Stop Time (ACUTE ONLY) 1300  Mobility Specialist Time Calculation (min) (ACUTE ONLY) 4 min   Pt was in the recliner on RA upon entry. Pt agreed to mobility. Pt was able to STS with minA with a 2 [employer]. Pt ambulated well, but experienced shaky legs. Pt returned and was repositioned in the recliner. Needs are within reach.  Clem Rodes Mobility Specialist 08/10/24, 2:08 PM

## 2024-08-11 DIAGNOSIS — I10 Essential (primary) hypertension: Secondary | ICD-10-CM | POA: Diagnosis not present

## 2024-08-11 DIAGNOSIS — R531 Weakness: Secondary | ICD-10-CM | POA: Diagnosis not present

## 2024-08-11 DIAGNOSIS — F19922 Other psychoactive substance use, unspecified with intoxication with perceptual disturbance: Secondary | ICD-10-CM | POA: Diagnosis not present

## 2024-08-11 NOTE — Plan of Care (Signed)
   Problem: Education: Goal: Knowledge of General Education information will improve Description Including pain rating scale, medication(s)/side effects and non-pharmacologic comfort measures Outcome: Progressing   Problem: Health Behavior/Discharge Planning: Goal: Ability to manage health-related needs will improve Outcome: Progressing

## 2024-08-11 NOTE — Progress Notes (Signed)
 Mobility Specialist - Progress Note    08/11/24 0900  Mobility  Activity Respositioned in chair;Ambulated with assistance;Stood at bedside  Level of Assistance Contact guard assist, steadying assist  Assistive Device Front wheel walker  Distance Ambulated (ft) 8 ft  Range of Motion/Exercises Active;All extremities  Activity Response Tolerated fair  Mobility visit 1 Mobility  Mobility Specialist Start Time (ACUTE ONLY) 0800  Mobility Specialist Stop Time (ACUTE ONLY) [id number]  Mobility Specialist Time Calculation (min) (ACUTE ONLY) 23 min   Pt was in bed with the HOB elevated on O2 @ 2L. Pt agreed to mobility. Pt is O2 vitals were taken throughout activity as a precaution. Pt is able to get to the EOB with minA CGA. Pt is able to STS with minA CGA and 2 [employer]. Pt ambulated ok with a pivot to the recliner as breakfast was being served. Pt O2 vitals were WNL. Pt is repositioned in the recliner with needs in reach and chair alarm on.  Clem Rodes Mobility Specialist 08/11/24, 9:45 AM

## 2024-08-11 NOTE — Progress Notes (Signed)
  Progress Note   Patient: Joe Hardy FMW:[id number] DOB: 12/20/1940 DOA: 08/07/2024     0 DOS: the patient was seen and examined on 08/11/2024   Brief hospital course: Joe Hardy is a 83 y.o. male with a PMH significant for HTN, HLD, bladder cancer.   They presented from home to the ED on 08/07/2024 with dry mouth and feeling groggy after trying a delta 8 gummy for the first time.   He received the fluids, condition has improved.  However, patient was seen by PT/OT, has significant weakness.  Recommended nursing home placement.   Principal Problem:   Intoxication by drug [facility]) Active Problems:   Essential hypertension   Hypotension   Generalized weakness   Assessment and Plan: Intoxication by delta 8- groggy but easy to arouse and is oriented. No focal findings. Condition has improved.   General weakness. Pending nursing home placement.  Patient still feels very weak, still cannot walk.   Hypotension Essential hypertension. Hypotension has resolved, blood pressure running high again.  Restart lisinopril  and terazosin .  Continue hold hydrochlorothiazide . BP well-controlled, no hypotension.   Microcytic anemia Anemia of chronic disease. Patient has mild low iron , but a significant elevated ferritin level.  Consistent with anemia of chronic disease.   Depression- Continue home regimen.   Hypothyroidism Continue home treatment.          Subjective:  Still feel very weak, not able to walk.  Denies any shortness of breath.  Physical Exam: Vitals:   08/10/24 1426 08/10/24 2044 08/11/24 0403 08/11/24 0721  BP: (!) 143/99 126/82 104/68 122/72  Pulse: (!) 108 85 79 78  Resp: 17 17 18 16   Temp: 98.2 F (36.8 C) 98.2 F (36.8 C)  98 F (36.7 C)  TempSrc:  Oral  Oral  SpO2: 100% 97% 98% 94%  Weight:      Height:       General exam: Appears calm and comfortable  Respiratory system: Clear to auscultation. Respiratory effort normal. Cardiovascular system:  S1 & S2 heard, RRR. No JVD, murmurs, rubs, gallops or clicks. No pedal edema. Gastrointestinal system: Abdomen is nondistended, soft and nontender. No organomegaly or masses felt. Normal bowel sounds heard. Central nervous system: Alert and oriented. No focal neurological deficits. Extremities: Symmetric 5 x 5 power. Skin: No rashes, lesions or ulcers Psychiatry: Judgement and insight appear normal. Mood & affect appropriate.    Data Reviewed:  There are no new results to review at this time.  Family Communication: None  Disposition: Status is: Observation Unsafe discharge, pending nursing home placement.     Time spent: 35 minutes  Author: Murvin Mana, MD 08/11/2024 12:25 PM  For on call review [email or web address].

## 2024-08-11 NOTE — Plan of Care (Signed)
  Problem: Safety: Goal: Ability to remain free from injury will improve Outcome: Progressing   Problem: Pain Managment: Goal: General experience of comfort will improve and/or be controlled Outcome: Progressing   Problem: Elimination: Goal: Will not experience complications related to bowel motility Outcome: Progressing   Problem: Nutrition: Goal: Adequate nutrition will be maintained Outcome: Progressing   Problem: Coping: Goal: Level of anxiety will decrease Outcome: Progressing

## 2024-08-12 DIAGNOSIS — F19922 Other psychoactive substance use, unspecified with intoxication with perceptual disturbance: Secondary | ICD-10-CM | POA: Diagnosis not present

## 2024-08-12 DIAGNOSIS — I10 Essential (primary) hypertension: Secondary | ICD-10-CM | POA: Diagnosis not present

## 2024-08-12 DIAGNOSIS — R531 Weakness: Secondary | ICD-10-CM | POA: Diagnosis not present

## 2024-08-12 LAB — CULTURE, BLOOD (SINGLE)
Culture: NO GROWTH
Culture: NO GROWTH
Special Requests: ADEQUATE

## 2024-08-12 NOTE — Progress Notes (Signed)
  Progress Note   Patient: Joe Hardy FMW:[id number] DOB: [birth year] DOA: 08/07/2024     0 DOS: the patient was seen and examined on 08/12/2024   Brief hospital course: Joe Hardy is a 83 y.o. male with a PMH significant for HTN, HLD, bladder cancer.   They presented from home to the ED on 08/07/2024 with dry mouth and feeling groggy after trying a delta 8 gummy for the first time.   He received the fluids, condition has improved.  However, patient was seen by PT/OT, has significant weakness.  Recommended nursing home placement.   Principal Problem:   Intoxication by drug [facility]) Active Problems:   Essential hypertension   Hypotension   Generalized weakness   Assessment and Plan: Intoxication by delta 8- groggy but easy to arouse and is oriented. No focal findings. No additional issues.   General weakness. Still very weak, especially under the knee.  Currently pending nursing home placement.   Hypotension Essential hypertension. Hypotension has resolved, blood pressure running high again.  Restart lisinopril  and terazosin .  Continue hold hydrochlorothiazide . BP well-controlled, no hypotension.   Microcytic anemia Anemia of chronic disease. Patient has mild low iron , but a significant elevated ferritin level.  Consistent with anemia of chronic disease.   Depression- Continue home regimen.   Hypothyroidism Continue home treatment.     Goals of care discussion. Patient expressed wishes not get resuscitated if something happens to him.  Explained to him how the resuscitation goes.  Decisions made to change CODE STATUS to DO NOT RESUSCITATE status.  Patient does not have any suicidal ideation.     Subjective:  Patient still very weak, otherwise no other complaint.  Physical Exam: Vitals:   08/11/24 1649 08/11/24 2048 08/12/24 0415 08/12/24 0830  BP: 109/67 117/70 112/68 (!) 152/77  Pulse: 91 89 81 83  Resp: 17 16 16 18   Temp: 98.1 F (36.7 C) 98.1 F (36.7  C) 98.3 F (36.8 C) (!) 97.4 F (36.3 C)  TempSrc:      SpO2: 96% 96% 98% 97%  Weight:      Height:       General exam: Appears calm and comfortable  Respiratory system: Clear to auscultation. Respiratory effort normal. Cardiovascular system: S1 & S2 heard, RRR. No JVD, murmurs, rubs, gallops or clicks. No pedal edema. Gastrointestinal system: Abdomen is nondistended, soft and nontender. No organomegaly or masses felt. Normal bowel sounds heard. Central nervous system: Alert and oriented. No focal neurological deficits. Extremities: Symmetric 5 x 5 power. Skin: No rashes, lesions or ulcers Psychiatry: Judgement and insight appear normal. Mood & affect appropriate.    Data Reviewed:  There are no new results to review at this time.  Family Communication: No family  Disposition: Status is: Observation      Time spent: 50 minutes  Author: Murvin Mana, MD 08/12/2024 10:16 AM  For on call review [email or web address]

## 2024-08-12 NOTE — TOC Progression Note (Signed)
 Transition of Care [facility]) - Progression Note    Patient Details  Name: Joe Hardy MRN: [id number] Date of Birth: 03/24/1941  Transition of Care [facility]) CM/SW Contact  Salimatou Simone L Satya [place]all, [place] Phone Number: 08/12/2024, 9:37 AM  Clinical Narrative:     CSW followed up with [facility] liaison, Massie, regarding bed offer. Massie advised that there are some pending discharges and he anticipates having a bed available tomorrow.                     Expected Discharge Plan and Services                                               Social Drivers of Health (SDOH) Interventions SDOH Screenings   Food Insecurity: No Food Insecurity (08/07/2024)  Housing: Low Risk  (08/07/2024)  Transportation Needs: No Transportation Needs (08/07/2024)  Utilities: Not At Risk (08/07/2024)  Alcohol Screen: Low Risk  (09/30/2023)  Depression (PHQ2-9): Low Risk  (09/30/2023)  Financial Resource Strain: Low Risk  (04/26/2024)   Received from [facility]  Physical Activity: Insufficiently Active (09/30/2023)  Social Connections: Socially Isolated (08/07/2024)  Stress: No Stress Concern Present (09/30/2023)  Tobacco Use: Medium Risk (08/07/2024)  Health Literacy: Adequate Health Literacy (09/30/2023)    Readmission Risk Interventions     No data to display

## 2024-08-12 NOTE — Progress Notes (Signed)
 Mobility Specialist - Progress Note    08/12/24 0830  Therapy Vitals  Temp (!) 97.4 F (36.3 C)  Pulse Rate 83  Resp 18  BP (!) 152/77  Patient Position (if appropriate) Lying  Oxygen Therapy  SpO2 97 %  Mobility  Activity Ambulated with assistance;Stood at bedside;Respositioned in chair  Level of Assistance Contact guard assist, steadying assist  Assistive Device Front wheel walker  Distance Ambulated (ft) 15 ft  Range of Motion/Exercises Active;All extremities  Activity Response Tolerated well  Mobility visit 1 Mobility  Mobility Specialist Start Time (ACUTE ONLY) [id number]  Mobility Specialist Stop Time (ACUTE ONLY) 0843  Mobility Specialist Time Calculation (min) (ACUTE ONLY) 20 min   Pt was supine in the bed on RA upon entry. Pt agreed to mobility. Pt was able to get to the EOB independently with bed features. Pt was able to STS with minA CGA with 2 [employer]. Pt ambulated well. Pt did need a recovery break due to stiff neck pain. After activity pt repositioned in the recliner with needs in reach and chair alarm on.  Clem Rodes Mobility Specialist 08/12/24, 8:59 AM

## 2024-08-12 NOTE — Plan of Care (Signed)

## 2024-08-12 NOTE — Progress Notes (Addendum)
 [name] spoke to me in depth about how his quality of life is deteriorating and how he's tired of fighting. I spoke to him about his current code status (full code) and he said that he would like to speak to his doctor about changing his code status. I notified MD Laurita, will continue to monitor.

## 2024-08-13 DIAGNOSIS — F19922 Other psychoactive substance use, unspecified with intoxication with perceptual disturbance: Secondary | ICD-10-CM | POA: Diagnosis not present

## 2024-08-13 DIAGNOSIS — R531 Weakness: Secondary | ICD-10-CM | POA: Diagnosis not present

## 2024-08-13 DIAGNOSIS — I10 Essential (primary) hypertension: Secondary | ICD-10-CM | POA: Diagnosis not present

## 2024-08-13 NOTE — Discharge Summary (Signed)
 Physician Discharge Summary   Patient: Joe Hardy MRN: [id number] DOB: [birth year]  Admit date:     08/07/2024  Discharge date: 08/13/24  Discharge Physician: Murvin Mana   PCP: Edman Marsa PARAS, DO   Recommendations at discharge:   Follow-up with PCP in 1 week  Discharge Diagnoses: Principal Problem:   Intoxication by drug [facility]) Active Problems:   Essential hypertension   Hypotension   Generalized weakness  Resolved Problems:   * No resolved hospital problems. [facility] Course: Lamon Rotundo is a 83 y.o. male with a PMH significant for HTN, HLD, bladder cancer.   They presented from home to the ED on 08/07/2024 with dry mouth and feeling groggy after trying a delta 8 gummy for the first time.   He received the fluids, condition has improved.  However, patient was seen by PT/OT, has significant weakness.  Recommended nursing home placement.  Assessment and Plan: ntoxication by delta 8- groggy but easy to arouse and is oriented. No focal findings. No additional issues.   General weakness. Still very weak, especially under the knee.  Approved for nursing placement today.   Hypotension Essential hypertension. Hypotension resolved, restart all home medicines.   Microcytic anemia Anemia of chronic disease. Patient has mild low iron , but a significant elevated ferritin level.  Consistent with anemia of chronic disease.   Depression- Continue home regimen.   Hypothyroidism Continue home treatment.        Consultants: None Procedures performed: None  Disposition: Skilled nursing facility Diet recommendation:  Discharge Diet Orders (From admission, onward)     Start     Ordered   08/13/24 0000  Diet - low sodium heart healthy        08/13/24 0915           Cardiac diet DISCHARGE MEDICATION: Allergies as of 08/13/2024       Reactions   Fluoxetine Other (See Comments)   insomnia   Venlafaxine Other (See Comments)   difficulty sleeping         Medication List     STOP taking these medications    magnesium  30 MG tablet       TAKE these medications    atorvastatin  20 MG tablet Commonly known as: LIPITOR TAKE 1 TABLET BY MOUTH DAILY AT  6PM   b complex vitamins tablet Take 1 tablet by mouth daily.   baclofen  10 MG tablet Commonly known as: LIORESAL  Take 0.5-1 tablets (5-10 mg total) by mouth 2 (two) times daily as needed for muscle spasms (for back). Do not take on same day as Flexeril    buPROPion  150 MG 24 hr tablet Commonly known as: WELLBUTRIN  XL Take 450 mg by mouth daily.   busPIRone  10 MG tablet Commonly known as: BUSPAR  TAKE 1 TABLET BY MOUTH TWICE  DAILY   cyanocobalamin 1000 MCG tablet Take 1,000 mcg by mouth daily.   cyclobenzaprine  10 MG tablet Commonly known as: FLEXERIL  Take 1 tablet (10 mg total) by mouth 2 (two) times daily as needed for muscle spasms (for neck). Do not take on same day as Baclofen    Iron  (Ferrous Sulfate ) 325 (65 Fe) MG Tabs Take 1 tablet by mouth daily.   levothyroxine  75 MCG tablet Commonly known as: SYNTHROID  TAKE 1 TABLET BY MOUTH DAILY  BEFORE BREAKFAST   lisinopril -hydrochlorothiazide  10-12.5 MG tablet Commonly known as: ZESTORETIC  Take 1 tablet by mouth daily.   MiraLax  17 GM/SCOOP powder Generic drug: polyethylene glycol powder Take 17 g by mouth daily.  mirtazapine  30 MG tablet Commonly known as: REMERON  Take 30 mg by mouth at bedtime.   NEURIVA PLUS PO Take 1 capsule by mouth daily in the afternoon.   SM Wrist Cuff BP Monitor Misc 1 Units by Does not apply route 2 (two) times a week.   terazosin  5 MG capsule Commonly known as: HYTRIN  TAKE 1 CAPSULE BY MOUTH DAILY   traZODone  150 MG tablet Commonly known as: DESYREL  Take 150 mg by mouth at bedtime.   vitamin C 1000 MG tablet Take 1,000 mg by mouth daily.   Vitamin D 50 MCG (2000 UT) tablet Take 4,000 Units by mouth daily.        Contact information for follow-up providers      Edman Marsa PARAS, DO Follow up in 1 week(s).   Specialty: Family Medicine Contact information: [address] [place] [address] [phone]              Contact information for after-discharge care     Destination     [facility] .   Service: Skilled Nursing Contact information: [address] [place]  [address] [phone]                    Discharge Exam: Fredricka Weights   08/07/24 0733  Weight: 79.5 kg   General exam: Appears calm and comfortable  Respiratory system: Clear to auscultation. Respiratory effort normal. Cardiovascular system: S1 & S2 heard, RRR. No JVD, murmurs, rubs, gallops or clicks. No pedal edema. Gastrointestinal system: Abdomen is nondistended, soft and nontender. No organomegaly or masses felt. Normal bowel sounds heard. Central nervous system: Alert and oriented. No focal neurological deficits. Extremities: Symmetric 5 x 5 power. Skin: No rashes, lesions or ulcers Psychiatry: Judgement and insight appear normal. Mood & affect appropriate.    Condition at discharge: good  The results of significant diagnostics from this hospitalization (including imaging, microbiology, ancillary and laboratory) are listed below for reference.   Imaging Studies: DG Chest Portable 1 View Result Date: 08/07/2024 EXAM: 1 VIEW(S) XRAY OF THE CHEST 08/07/2024 05:42:03 AM COMPARISON: PA and lateral radiographs of the chest dated 06/12/2012. CLINICAL HISTORY: Hypotension, likely medication related but want to rule out sepsis. FINDINGS: LUNGS AND PLEURA: Lower lung volumes. Mild streaky opacity at left lung base. No pleural effusion. No pneumothorax. HEART AND MEDIASTINUM: No acute abnormality of the cardiac and mediastinal silhouettes. BONES AND SOFT TISSUES: No acute osseous abnormality. IMPRESSION: 1. No acute findings. 2. Lower lung volumes and mild streaky opacity at left lung base. Electronically signed by: Evalene Coho MD 08/07/2024 05:50 AM EST RP Workstation: [id number]    Microbiology: Results for orders placed or performed during the hospital encounter of 08/07/24  Blood culture (single)     Status: None   Collection Time: 08/07/24  5:33 AM   Specimen: BLOOD  Result Value Ref Range Status   Specimen Description BLOOD BLOOD RIGHT ARM  Final   Special Requests   Final    BOTTLES DRAWN AEROBIC AND ANAEROBIC Blood Culture adequate volume   Culture   Final    NO GROWTH 5 DAYS Performed at [facility], [address] [place], [place] [address]    Report Status 08/12/2024 FINAL  Final  Resp panel by RT-PCR (RSV, Flu A&B, Covid) Anterior Nasal Swab     Status: None   Collection Time: 08/07/24  6:35 AM   Specimen: Anterior Nasal Swab  Result Value Ref Range Status  SARS Coronavirus 2 by RT PCR NEGATIVE NEGATIVE Final    Comment: (NOTE) SARS-CoV-2 target nucleic acids are NOT DETECTED.  The SARS-CoV-2 RNA is generally detectable in upper respiratory specimens during the acute phase of infection. The lowest concentration of SARS-CoV-2 viral copies this assay can detect is 138 copies/mL. A negative result does not preclude SARS-Cov-2 infection and should not be used as the sole basis for treatment or other patient management decisions. A negative result may occur with  improper specimen collection/handling, submission of specimen other than nasopharyngeal swab, presence of viral mutation(s) within the areas targeted by this assay, and inadequate number of viral copies(<138 copies/mL). A negative result must be combined with clinical observations, patient history, and epidemiological information. The expected result is Negative.  Fact Sheet for Patients:  bloggercourse.com  Fact Sheet for Healthcare Providers:  seriousbroker.it  This test is no t yet approved or cleared by the [place]  FDA and  has been authorized for  detection and/or diagnosis of SARS-CoV-2 by FDA under an Emergency Use Authorization (EUA). This EUA will remain  in effect (meaning this test can be used) for the duration of the COVID-19 declaration under Section 564(b)(1) of the Act, 21 U.S.C.section 360bbb-3(b)(1), unless the authorization is terminated  or revoked sooner.       Influenza A by PCR NEGATIVE NEGATIVE Final   Influenza B by PCR NEGATIVE NEGATIVE Final    Comment: (NOTE) The Xpert Xpress SARS-CoV-2/FLU/RSV plus assay is intended as an aid in the diagnosis of influenza from Nasopharyngeal swab specimens and should not be used as a sole basis for treatment. Nasal washings and aspirates are unacceptable for Xpert Xpress SARS-CoV-2/FLU/RSV testing.  Fact Sheet for Patients: bloggercourse.com  Fact Sheet for Healthcare Providers: seriousbroker.it  This test is not yet approved or cleared by the [place]  FDA and has been authorized for detection and/or diagnosis of SARS-CoV-2 by FDA under an Emergency Use Authorization (EUA). This EUA will remain in effect (meaning this test can be used) for the duration of the COVID-19 declaration under Section 564(b)(1) of the Act, 21 U.S.C. section 360bbb-3(b)(1), unless the authorization is terminated or revoked.     Resp Syncytial Virus by PCR NEGATIVE NEGATIVE Final    Comment: (NOTE) Fact Sheet for Patients: bloggercourse.com  Fact Sheet for Healthcare Providers: seriousbroker.it  This test is not yet approved or cleared by the [place]  FDA and has been authorized for detection and/or diagnosis of SARS-CoV-2 by FDA under an Emergency Use Authorization (EUA). This EUA will remain in effect (meaning this test can be used) for the duration of the COVID-19 declaration under Section 564(b)(1) of the Act, 21 U.S.C. section 360bbb-3(b)(1), unless the authorization is  terminated or revoked.  Performed at [facility], [address] [place], [place] [address]   Blood culture (single)     Status: None   Collection Time: 08/07/24  6:35 AM   Specimen: BLOOD  Result Value Ref Range Status   Specimen Description BLOOD BLOOD RIGHT ARM  Final   Special Requests   Final    BOTTLES DRAWN AEROBIC AND ANAEROBIC Blood Culture results may not be optimal due to an inadequate volume of blood received in culture bottles   Culture   Final    NO GROWTH 5 DAYS Performed at [facility], [address], [place], [place] [address]    Report Status 08/12/2024 FINAL  Final    Labs: CBC: Recent Labs  Lab 08/07/24 0303 08/08/24 0805  WBC  6.8 3.7*  NEUTROABS 5.6  --   HGB 12.3* 12.7*  HCT 37.3* 40.2  MCV 67.2* 69.8*  PLT 185 179   Basic Metabolic Panel: Recent Labs  Lab 08/07/24 0303 08/07/24 1253 08/08/24 0805  NA 131*  --  141  K 4.3  --  4.7  CL 98  --  110  CO2 24  --  24  GLUCOSE 131*  --  113*  BUN 21  --  13  CREATININE 1.15  --  0.84  CALCIUM  9.2  --  8.8*  MG  --  1.9  --    Liver Function Tests: Recent Labs  Lab 08/07/24 0303 08/08/24 0805  AST 18 15  ALT 10 10  ALKPHOS 49 53  BILITOT 0.3 0.2  PROT 5.8* 5.7*  ALBUMIN 3.6 3.5   CBG: Recent Labs  Lab 08/07/24 0329  GLUCAP 138*    Discharge time spent: 35 minutes.  Signed: Murvin Mana, MD [facility] 08/13/2024

## 2024-08-13 NOTE — Progress Notes (Signed)
 Mobility Specialist Progress Note:    08/13/24 0838  Mobility  Activity Refused and notified nurse if applicable   Pt refused mobility, no specific reason but requests this author to return at a later time. All needs met.  Sherrilee Ditty Mobility Specialist Please contact via Special Educational Needs Teacher or  Rehab office at [phone]

## 2024-08-13 NOTE — Progress Notes (Signed)
 Patient has met all discharge goals. [facility] mobile transport, transporting patient.

## 2024-08-13 NOTE — Plan of Care (Signed)

## 2024-08-13 NOTE — TOC Transition Note (Signed)
 Transition of Care [facility]) - Discharge Note   Patient Details  Name: Joe Hardy MRN: [id number] Date of Birth: 1941-02-20  Transition of Care [facility]) CM/SW Contact:  Alvaro Louder, LCSW Phone Number: 08/13/2024, 11:04 AM   Clinical Narrative:   LCSWA received insurance approval for patient to admit to SNF [facility]. LCSWA confirmed with MD that patient is stable for discharge. LCSWA notified the patient and they are in agreement with discharge. LCSWA confirmed bed is available at SNF. Transport arranged with [facility] for next available.  Number to call report: [phone] RM: 90A   TOC signing off  Final next level of care: Skilled Nursing Facility Barriers to Discharge: No Barriers Identified   Patient Goals and CMS Choice            Discharge Placement              Patient [facility] at[facility] Patient to be transferred to facility by: [facility] Name of family member notified: Self Patient and family notified of of transfer: 08/13/24  Discharge Plan and Services Additional resources added to the After Visit Summary for                                       Social Drivers of Health (SDOH) Interventions SDOH Screenings   Food Insecurity: No Food Insecurity (08/07/2024)  Housing: Low Risk  (08/07/2024)  Transportation Needs: No Transportation Needs (08/07/2024)  Utilities: Not At Risk (08/07/2024)  Alcohol Screen: Low Risk  (09/30/2023)  Depression (PHQ2-9): Low Risk  (09/30/2023)  Financial Resource Strain: Low Risk  (04/26/2024)   Received from [facility]  Physical Activity: Insufficiently Active (09/30/2023)  Social Connections: Socially Isolated (08/07/2024)  Stress: No Stress Concern Present (09/30/2023)  Tobacco Use: Medium Risk (08/07/2024)  Health Literacy: Adequate Health Literacy (09/30/2023)     Readmission Risk Interventions     No data to display

## 2024-08-13 NOTE — Plan of Care (Signed)
  Problem: Education: Goal: Knowledge of General Education information will improve Description: Including pain rating scale, medication(s)/side effects and non-pharmacologic comfort measures Outcome: Progressing   Problem: Activity: Goal: Risk for activity intolerance will decrease Outcome: Progressing   Problem: Nutrition: Goal: Adequate nutrition will be maintained Outcome: Progressing   Problem: Coping: Goal: Level of anxiety will decrease Outcome: Progressing   Problem: Elimination: Goal: Will not experience complications related to urinary retention Outcome: Progressing   

## 2024-08-30 ENCOUNTER — Encounter: Payer: Self-pay | Admitting: Urology

## 2024-09-11 ENCOUNTER — Telehealth: Payer: Self-pay

## 2024-09-11 NOTE — Telephone Encounter (Signed)
 Left message for patient to return call OK to advise

## 2024-09-11 NOTE — Telephone Encounter (Signed)
 I understand his concern but I am not sure that I can solve this on my end. I don't have authority to discharge patients from skilled nursing facilities.  Typically if he is ready to be discharged or discusses his concerns with the provider or doctor there they can discharge him to home with Home Health or therapy if they feel it is necessary. Once discharged he can be scheduled to see me for hospital follow-up.  Marsa Officer, DO [facility] 09/11/2024, 4:02 PM

## 2024-09-11 NOTE — Telephone Encounter (Signed)
 Spoke to [facility], explained we can not discharge Joe Hardy. He will need to speak with the providers at rehab

## 2024-09-11 NOTE — Telephone Encounter (Signed)
 Copied from CRM #[id number]. Topic: Clinical - Medical Advice >> Sep 11, 2024 12:35 PM Kevelyn M wrote: Reason for CRM: Patient is calling in because he's in rehab and he's trying to get out of rehab. He no longer wants to be there. Patient stated that he had tremors in his lower legs when he first went into rehab but now he can walk. He said he doesn't want to go through the long process to go home. He says he's desperate. He's at [facility])  Call back # [phone]/[facility] # [phone]

## 2024-09-27 ENCOUNTER — Telehealth: Payer: Self-pay

## 2024-09-27 NOTE — Transitions of Care (Post Inpatient/ED Visit) (Signed)
" ° °  09/27/2024  Name: Joe Hardy MRN: [id number] DOB: 17-Jun-1941  Today's TOC FU Call Status: Today's TOC FU Call Status:: Unsuccessful Call (1st Attempt) Unsuccessful Call (1st Attempt) Date: 09/27/24  Attempted to reach the patient regarding the most recent Inpatient/ED visit.  Follow Up Plan: Additional outreach attempts will be made to reach the patient to complete the Transitions of Care (Post Inpatient/ED visit) call.   Signature Julian Lemmings, LPN [facility] Nurse Health Advisor Direct Dial [phone]  "

## 2024-09-28 NOTE — Transitions of Care (Post Inpatient/ED Visit) (Signed)
 "  09/28/2024  Name: Joe Hardy MRN: [id number] DOB: 10/23/1940  Today's TOC FU Call Status: Today's TOC FU Call Status:: Successful TOC FU Call Completed Unsuccessful Call (1st Attempt) Date: 09/27/24 [facility] FU Call Complete Date: 09/28/24  Patient's Name and Date of Birth confirmed. Name, DOB  Transition Care Management Follow-up Telephone Call Date of Discharge: 09/26/24 Discharge Facility: [facility]) Type of Discharge: Inpatient Admission Primary Inpatient Discharge Diagnosis:: dyshagia How have you been since you were released from the hospital?: Better Any questions or concerns?: No  Items Reviewed: Did you receive and understand the discharge instructions provided?: Yes Medications obtained,verified, and reconciled?: Yes (Medications Reviewed) Any new allergies since your discharge?: No Dietary orders reviewed?: Yes Do you have support at home?: Yes People in Home [RPT]: friend(s)  Medications Reviewed Today: Medications Reviewed Today     Reviewed by Emmitt Pan, LPN (Licensed Practical Nurse) on 09/28/24 at 1136  Med List Status: <None>   Medication Order Taking? Sig Documenting Provider Last Dose Status Informant  Ascorbic Acid (VITAMIN C) 1000 MG tablet [id number] Yes Take 1,000 mg by mouth daily. [provider]  Active Self, Pharmacy Records  atorvastatin  (LIPITOR) 20 MG tablet [id number] Yes TAKE 1 TABLET BY MOUTH DAILY AT  JEREL Officer, Marsa PARAS, DO  Active Self, Pharmacy Records  b complex vitamins tablet [id number] Yes Take 1 tablet by mouth daily. [provider]  Active Self, Pharmacy Records           Med Note GROVER BURNARD GORMAN Pablo Jan 17, 2023  1:12 PM)    baclofen  (LIORESAL ) 10 MG tablet [id number] Yes Take 0.5-1 tablets (5-10 mg total) by mouth 2 (two) times daily as needed for muscle spasms (for back). Do not take on same day as Flexeril  Karamalegos, Marsa PARAS, DO  Active Self, Pharmacy Records   Blood Pressure Monitoring (SM WRIST CUFF BP MONITOR) MISC [id number] Yes 1 Units by Does not apply route 2 (two) times a week. Fredrik Nat HERO, FNP  Active Self, Pharmacy Records  buPROPion  (WELLBUTRIN  XL) 150 MG 24 hr tablet [id number] Yes Take 450 mg by mouth daily. [provider]  Active Self, Pharmacy Records  busPIRone  (BUSPAR ) 10 MG tablet [id number] Yes TAKE 1 TABLET BY MOUTH TWICE  DAILY Karamalegos, Marsa PARAS, DO  Active Self, Pharmacy Records  Cholecalciferol (VITAMIN D) 50 MCG (2000 UT) tablet [id number] Yes Take 4,000 Units by mouth daily. [provider]  Active Self, Pharmacy Records  Cobalamin Combinations (NEURIVA PLUS PO) [id number] Yes Take 1 capsule by mouth daily in the afternoon. [provider]  Active Self, Pharmacy Records  cyanocobalamin 1000 MCG tablet [id number] Yes Take 1,000 mcg by mouth daily. [provider]  Active Self, Pharmacy Records  cyclobenzaprine  (FLEXERIL ) 10 MG tablet [id number] Yes Take 1 tablet (10 mg total) by mouth 2 (two) times daily as needed for muscle spasms (for neck). Do not take on same day as Baclofen  Karamalegos, Marsa PARAS, DO  Active Self, Pharmacy Records  Iron , Ferrous Sulfate , 325 (65 Fe) MG TABS [id number] Yes Take 1 tablet by mouth daily. Awanda [place], MD  Active Self, Pharmacy Records  levothyroxine  (SYNTHROID ) 75 MCG tablet [id number] Yes TAKE 1 TABLET BY MOUTH DAILY  BEFORE BREAKFAST Officer Marsa PARAS, DO  Active Self, Pharmacy Records  lisinopril -hydrochlorothiazide  (ZESTORETIC ) 10-12.5 MG tablet [id number] Yes Take 1 tablet by mouth daily. Officer Marsa PARAS, DO  Active Self, Pharmacy Records  MIRALAX  17 GM/SCOOP powder [id number]  Take 17  g by mouth daily.  Patient not taking: Reported on 09/28/2024   [provider]  Active Self, Pharmacy Records  mirtazapine  (REMERON ) 30 MG tablet [id number] Yes Take 30 mg by mouth at bedtime. [provider]  Active Self, Pharmacy Records   terazosin  (HYTRIN ) 5 MG capsule [id number] Yes TAKE 1 CAPSULE BY MOUTH DAILY Edman, Marsa PARAS, DO  Active Self, Pharmacy Records  traZODone  (DESYREL ) 150 MG tablet [id number] Yes Take 150 mg by mouth at bedtime. [provider]  Active Self, Pharmacy Records            Home Care and Equipment/Supplies:    Functional Questionnaire: Do you need assistance with bathing/showering or dressing?: No Do you need assistance with meal preparation?: Yes Do you need assistance with eating?: No Do you have difficulty maintaining continence: No Do you need assistance with getting out of bed/getting out of a chair/moving?: No Do you have difficulty managing or taking your medications?: Yes  Follow up appointments reviewed: PCP Follow-up appointment confirmed?: Yes Date of PCP follow-up appointment?: 10/04/24 Follow-up Provider: [facility] Follow-up appointment confirmed?: NA Do you need transportation to your follow-up appointment?: No Do you understand care options if your condition(s) worsen?: Yes-patient verbalized understanding    SIGNATURE Julian Lemmings, LPN [facility] Nurse Health Advisor Direct Dial [phone]  "

## 2024-10-03 ENCOUNTER — Other Ambulatory Visit: Payer: Self-pay | Admitting: Family Medicine

## 2024-10-03 DIAGNOSIS — E034 Atrophy of thyroid (acquired): Secondary | ICD-10-CM

## 2024-10-03 DIAGNOSIS — N401 Enlarged prostate with lower urinary tract symptoms: Secondary | ICD-10-CM

## 2024-10-04 ENCOUNTER — Ambulatory Visit (INDEPENDENT_AMBULATORY_CARE_PROVIDER_SITE_OTHER): Admitting: Family Medicine

## 2024-10-04 ENCOUNTER — Encounter: Payer: Self-pay | Admitting: Family Medicine

## 2024-10-04 VITALS — BP 130/68 | HR 84 | Ht 69.0 in | Wt 165.0 lb

## 2024-10-04 DIAGNOSIS — D485 Neoplasm of uncertain behavior of skin: Secondary | ICD-10-CM | POA: Diagnosis not present

## 2024-10-04 DIAGNOSIS — G3184 Mild cognitive impairment, so stated: Secondary | ICD-10-CM

## 2024-10-04 DIAGNOSIS — M6284 Sarcopenia: Secondary | ICD-10-CM

## 2024-10-04 DIAGNOSIS — R2681 Unsteadiness on feet: Secondary | ICD-10-CM | POA: Diagnosis not present

## 2024-10-04 DIAGNOSIS — R29898 Other symptoms and signs involving the musculoskeletal system: Secondary | ICD-10-CM | POA: Diagnosis not present

## 2024-10-04 DIAGNOSIS — C689 Malignant neoplasm of urinary organ, unspecified: Secondary | ICD-10-CM

## 2024-10-04 DIAGNOSIS — L98421 Non-pressure chronic ulcer of back limited to breakdown of skin: Secondary | ICD-10-CM | POA: Diagnosis not present

## 2024-10-04 DIAGNOSIS — E441 Mild protein-calorie malnutrition: Secondary | ICD-10-CM | POA: Diagnosis not present

## 2024-10-04 DIAGNOSIS — R296 Repeated falls: Secondary | ICD-10-CM

## 2024-10-04 NOTE — Patient Instructions (Addendum)
 Thank you for coming to the office today.  Recommend using Barrier Cream or Diaper Rash Cream or Desitin brand - OTC to use on the skin sore on your bottom. - Use as much as needed to keep protected - Avoid adhesive - Can use non adherent non stick pad for support. - Try to change positions  Dermatologist will look at that spot behind ear when you go, I think it needs to be frozen and treated. It will keep scabbing.  Refilled medications.  Keep apt in March.  Please schedule a Follow-up Appointment to: Return if symptoms worsen or fail to improve.  If you have any other questions or concerns, please feel free to call the office or send a message through MyChart. You may also schedule an earlier appointment if necessary.  Additionally, you may be receiving a survey about your experience at our office within a few days to 1 week by e-mail or mail. We value your feedback.  Marsa Officer, DO [facility], [place]

## 2024-10-04 NOTE — Progress Notes (Signed)
 "  Subjective:    Patient ID: Joe Hardy, male    DOB: 1940/11/25, 84 y.o.   MRN: [id number]  Joe Hardy is a 84 y.o. male presenting on 10/04/2024 for Hospitalization Follow-up   HPI  Discussed the use of AI scribe software for clinical note transcription with the patient, who gave verbal consent to proceed.  History of Present Illness   Joe Hardy is an 84 year old male who presents for a follow-up after hospitalization.      HOSPITAL FOLLOW-UP VISIT  Hospital/Location: [facility] Date of Admission: 08/07/24 Date of Discharge: 08/13/24 Transitions of care telephone call: 09/27/24 TOC call completed by Julian Lemmings LPN  Reason for Admission: cannabis overdose accidental / weakness  - Hospital H&P and Discharge Summary have been reviewed - Patient presents today >7 weeks after recent hospitalization.  Cognitive impairment and emotional lability - Significant cognitive difficulties following recent hospitalization - Initial concern for Alzheimer's disease or dementia - Gradual improvement in memory recall - Persistent emotional lability and feeling mentally 'not right', with day-by-day improvement  Physical deconditioning and sarcopenia - Hospitalized due to sarcopenia - Prolonged immobility and lack of physical activity during hospitalization - Surprised by ability to ambulate after extended bedrest - Expresses frustration with quality of care during hospitalization  Unintentional weight loss and nutritional status - Significant weight loss during hospitalization - Recent improvement in appetite  Dermatologic issues - Skin breakdown on buttocks resembling diaper rash or pressure sores, attributed to weight loss and immobility - Management with powder and topical treatments - Recent improvement in skin condition - Scab formation and resolution behind ear, possibly from scratching  Gastrointestinal symptoms - Constipation during hospitalization - Diarrhea  attributed to medications - History of occasional enema use - Discontinued Miralax  due to diarrhea - Currently managing bowel regimen at home  Adverse medication effects - Prescribed Depakote during hospitalization, resulting in excessive somnolence and feeling 'like a zombie' - Resistant to new medications, prefers pre-hospitalization regimen  Medication access issues - Awaiting medication refills from [facility], including prostate and thyroid  medications - No medication refills received since December    - Today reports overall has done well after discharge. Symptoms of weakness improving but still present. Appetite significantly improved - Changes to current meds on discharge: See list  I have reviewed the discharge medication list, and have reconciled the current and discharge medications today.  Current Medications[1]  ------------------------------------------------------------------------- Social History[2]  Review of Systems Per HPI unless specifically indicated above     Objective:    BP 130/68 (BP Location: Left Arm, Patient Position: Sitting, Cuff Size: Normal)   Pulse 84   Ht 5' 9 (1.753 m)   Wt 165 lb (74.8 kg)   SpO2 97%   BMI 24.37 kg/m   Wt Readings from Last 3 Encounters:  10/04/24 165 lb (74.8 kg)  08/07/24 175 lb 4.3 oz (79.5 kg)  07/25/24 175 lb 6 oz (79.5 kg)    Physical Exam Vitals and nursing note reviewed.  Constitutional:      General: He is not in acute distress.    Appearance: He is well-developed. He is not diaphoretic.     Comments: Chronically ill age 84 year male with some generalized thinning and muscle atrophy, comfortable, cooperative  HENT:     Head: Normocephalic and atraumatic.  Eyes:     General:        Right eye: No discharge.        Left eye: No discharge.     Conjunctiva/sclera: Conjunctivae  normal.  Neck:     Thyroid : No thyromegaly.  Cardiovascular:     Rate and Rhythm: Normal rate and regular rhythm.     Pulses:  Normal pulses.     Heart sounds: Normal heart sounds. No murmur heard. Pulmonary:     Effort: Pulmonary effort is normal. No respiratory distress.     Breath sounds: Normal breath sounds. No wheezing or rales.  Musculoskeletal:        General: Normal range of motion.     Cervical back: Normal range of motion and neck supple.     Comments: Wheelchair  Lymphadenopathy:     Cervical: No cervical adenopathy.  Skin:    General: Skin is warm and dry.     Findings: Lesion (Posterior to Left ear with raised dry crusty growth) present. No erythema or rash.  Neurological:     Mental Status: He is alert and oriented to person, place, and time. Mental status is at baseline.  Psychiatric:        Behavior: Behavior normal.     Comments: Well groomed, good eye contact, normal speech and thoughts     Results for orders placed or performed during the hospital encounter of 08/07/24  CBC with Differential/Platelet   Collection Time: 08/07/24  3:03 AM  Result Value Ref Range   WBC 6.8 4.0 - 10.5 K/uL   RBC 5.55 4.22 - 5.81 MIL/uL   Hemoglobin 12.3 (L) 13.0 - 17.0 g/dL   HCT 62.6 (L) 60.9 - 47.9 %   MCV 67.2 (L) 80.0 - 100.0 fL   MCH 22.2 (L) 26.0 - 34.0 pg   MCHC 33.0 30.0 - 36.0 g/dL   RDW 83.2 (H) 88.4 - 84.4 %   Platelets 185 150 - 400 K/uL   nRBC 0.0 0.0 - 0.2 %   Neutrophils Relative % 82 %   Neutro Abs 5.6 1.7 - 7.7 K/uL   Lymphocytes Relative 8 %   Lymphs Abs 0.5 (L) 0.7 - 4.0 K/uL   Monocytes Relative 8 %   Monocytes Absolute 0.6 0.1 - 1.0 K/uL   Eosinophils Relative 1 %   Eosinophils Absolute 0.0 0.0 - 0.5 K/uL   Basophils Relative 0 %   Basophils Absolute 0.0 0.0 - 0.1 K/uL   WBC Morphology MORPHOLOGY UNREMARKABLE    RBC Morphology See Note    Smear Review Normal platelet morphology    Immature Granulocytes 1 %   Abs Immature Granulocytes 0.05 0.00 - 0.07 K/uL  Comprehensive metabolic panel   Collection Time: 08/07/24  3:03 AM  Result Value Ref Range   Sodium 131 (L) 135 -  145 mmol/L   Potassium 4.3 3.5 - 5.1 mmol/L   Chloride 98 98 - 111 mmol/L   CO2 24 22 - 32 mmol/L   Glucose, Bld 131 (H) 70 - 99 mg/dL   BUN 21 8 - 23 mg/dL   Creatinine, Ser 8.84 0.61 - 1.24 mg/dL   Calcium  9.2 8.9 - 10.3 mg/dL   Total Protein 5.8 (L) 6.5 - 8.1 g/dL   Albumin 3.6 3.5 - 5.0 g/dL   AST 18 15 - 41 U/L   ALT 10 0 - 44 U/L   Alkaline Phosphatase 49 38 - 126 U/L   Total Bilirubin 0.3 0.0 - 1.2 mg/dL   GFR, Estimated >39 >39 mL/min   Anion gap 9 5 - 15  Ethanol   Collection Time: 08/07/24  3:03 AM  Result Value Ref Range   Alcohol, Ethyl (B) <15 <  15 mg/dL  Acetaminophen  level   Collection Time: 08/07/24  3:03 AM  Result Value Ref Range   Acetaminophen  (Tylenol ), Serum <10 (L) 10 - 30 ug/mL  Salicylate level   Collection Time: 08/07/24  3:03 AM  Result Value Ref Range   Salicylate Lvl <7.0 (L) 7.0 - 30.0 mg/dL  Troponin T, High Sensitivity   Collection Time: 08/07/24  3:03 AM  Result Value Ref Range   Troponin T High Sensitivity 22 (H) 0 - 19 ng/L  CBG monitoring, ED   Collection Time: 08/07/24  3:29 AM  Result Value Ref Range   Glucose-Capillary 138 (H) 70 - 99 mg/dL  Blood culture (single)   Collection Time: 08/07/24  5:33 AM   Specimen: BLOOD  Result Value Ref Range   Specimen Description BLOOD BLOOD RIGHT ARM    Special Requests      BOTTLES DRAWN AEROBIC AND ANAEROBIC Blood Culture adequate volume   Culture      NO GROWTH 5 DAYS Performed at [facility], [address] [place], [place] [address]    Report Status 08/12/2024 FINAL   Lactic acid, plasma   Collection Time: 08/07/24  5:33 AM  Result Value Ref Range   Lactic Acid, Venous 2.2 (HH) 0.5 - 1.9 mmol/L  Resp panel by RT-PCR (RSV, Flu A&B, Covid) Anterior Nasal Swab   Collection Time: 08/07/24  6:35 AM   Specimen: Anterior Nasal Swab  Result Value Ref Range   SARS Coronavirus 2 by RT PCR NEGATIVE NEGATIVE   Influenza A by PCR NEGATIVE NEGATIVE   Influenza B by PCR NEGATIVE  NEGATIVE   Resp Syncytial Virus by PCR NEGATIVE NEGATIVE  Blood culture (single)   Collection Time: 08/07/24  6:35 AM   Specimen: BLOOD  Result Value Ref Range   Specimen Description BLOOD BLOOD RIGHT ARM    Special Requests      BOTTLES DRAWN AEROBIC AND ANAEROBIC Blood Culture results may not be optimal due to an inadequate volume of blood received in culture bottles   Culture      NO GROWTH 5 DAYS Performed at [facility], [address] [place], [place] [address]    Report Status 08/12/2024 FINAL   Urinalysis, Routine w reflex microscopic -Urine, Clean Catch   Collection Time: 08/07/24  9:59 AM  Result Value Ref Range   Color, Urine YELLOW (A) YELLOW   APPearance CLOUDY (A) CLEAR   Specific Gravity, Urine 1.018 1.005 - 1.030   pH 6.0 5.0 - 8.0   Glucose, UA NEGATIVE NEGATIVE mg/dL   Hgb urine dipstick NEGATIVE NEGATIVE   Bilirubin Urine NEGATIVE NEGATIVE   Ketones, ur NEGATIVE NEGATIVE mg/dL   Protein, ur 30 (A) NEGATIVE mg/dL   Nitrite NEGATIVE NEGATIVE   Leukocytes,Ua LARGE (A) NEGATIVE   RBC / HPF 6-10 0 - 5 RBC/hpf   WBC, UA >50 0 - 5 WBC/hpf   Bacteria, UA RARE (A) NONE SEEN   Squamous Epithelial / HPF 0-5 0 - 5 /HPF   Mucus PRESENT    Hyaline Casts, UA PRESENT    Ca Oxalate Crys, UA PRESENT   Urine Drug Screen   Collection Time: 08/07/24  9:59 AM  Result Value Ref Range   Opiates NEGATIVE NEGATIVE   Cocaine NEGATIVE NEGATIVE   Benzodiazepines NEGATIVE NEGATIVE   Amphetamines NEGATIVE NEGATIVE   Tetrahydrocannabinol POSITIVE (A) NEGATIVE   Barbiturates NEGATIVE NEGATIVE   Methadone Scn, Ur NEGATIVE NEGATIVE   Fentanyl  NEGATIVE NEGATIVE  Procalcitonin   Collection  Time: 08/07/24  9:59 AM  Result Value Ref Range   Procalcitonin <0.10 ng/mL  Thyroid  Profile   Collection Time: 08/07/24 12:53 PM  Result Value Ref Range   T4, Total 7.8 4.5 - 12.0 ug/dL   T3 Uptake Ratio 32 24 - 39 %   Free Thyroxine Index 2.5 1.2 - 4.9  Iron  and TIBC    Collection Time: 08/07/24 12:53 PM  Result Value Ref Range   Iron  32 (L) 45 - 182 ug/dL   TIBC 785 (L) 749 - 549 ug/dL   Saturation Ratios 15 (L) 17.9 - 39.5 %   UIBC 182 ug/dL  Ferritin   Collection Time: 08/07/24 12:53 PM  Result Value Ref Range   Ferritin 526 (H) 24 - 336 ng/mL  Magnesium    Collection Time: 08/07/24 12:53 PM  Result Value Ref Range   Magnesium  1.9 1.7 - 2.4 mg/dL  CBC   Collection Time: 08/08/24  8:05 AM  Result Value Ref Range   WBC 3.7 (L) 4.0 - 10.5 K/uL   RBC 5.76 4.22 - 5.81 MIL/uL   Hemoglobin 12.7 (L) 13.0 - 17.0 g/dL   HCT 59.7 60.9 - 47.9 %   MCV 69.8 (L) 80.0 - 100.0 fL   MCH 22.0 (L) 26.0 - 34.0 pg   MCHC 31.6 30.0 - 36.0 g/dL   RDW 82.1 (H) 88.4 - 84.4 %   Platelets 179 150 - 400 K/uL   nRBC 0.0 0.0 - 0.2 %  Comprehensive metabolic panel with GFR   Collection Time: 08/08/24  8:05 AM  Result Value Ref Range   Sodium 141 135 - 145 mmol/L   Potassium 4.7 3.5 - 5.1 mmol/L   Chloride 110 98 - 111 mmol/L   CO2 24 22 - 32 mmol/L   Glucose, Bld 113 (H) 70 - 99 mg/dL   BUN 13 8 - 23 mg/dL   Creatinine, Ser 9.15 0.61 - 1.24 mg/dL   Calcium  8.8 (L) 8.9 - 10.3 mg/dL   Total Protein 5.7 (L) 6.5 - 8.1 g/dL   Albumin 3.5 3.5 - 5.0 g/dL   AST 15 15 - 41 U/L   ALT 10 0 - 44 U/L   Alkaline Phosphatase 53 38 - 126 U/L   Total Bilirubin 0.2 0.0 - 1.2 mg/dL   GFR, Estimated >39 >39 mL/min   Anion gap 7 5 - 15      Assessment & Plan:   Problem List Items Addressed This Visit     Urothelial carcinoma (HCC)   Other Visit Diagnoses       Bilateral leg weakness    -  Primary     Unsteady gait when walking         Recurrent falls         Mild protein-calorie malnutrition         Sarcopenia         Skin ulcer of sacrum, limited to breakdown of skin (HCC)         Neoplasm of uncertain behavior of skin         Mild cognitive impairment            Sarcopenia / Protein Calorie Malnutrition mild Generalized Weakness Likely exacerbated by recent  hospitalization and inactivity. Reports weakness and mobility issues post-discharge. Significant weight loss and malnutrition post-hospitalization. Reports return of appetite and increased intake. - Encouraged increased nutritional intake and monitoring of weight gain. - Encouraged nutritional intake and supplements for muscle recovery. - Limited PT received  due to SNF  Mild Cognitive impairment post-hospitalization with recall difficulty and emotional distress. Symptoms improving. - Continue follow-up with psychiatrist in [place].  Stage 1 pressure injury of sacral region Stage 1 pressure injury likely from immobility and weight loss. Reports skin breakdown similar to diaper rash. - Apply Desitin or similar barrier cream. - Avoid adhesives and use nonadherent pads. - Change positions frequently to reduce pressure.  Actinic keratosis / Abnormal Skin Growth Scabbed skin growth behind the ear. Dermatologist follow-up scheduled. - Continue follow-up with dermatologist in one month for evaluation and potential cryotherapy.  Hypothyroidism Controlled on Levothyroxine  75mcg daily  Benign prostatic hyperplasia Chronic problem Followed by Urology  Urothelial Cancer  S./p treatment Followed by Urology       No orders of the defined types were placed in this encounter.   Follow up plan: Return if symptoms worsen or fail to improve.   Marsa Officer, DO [facility] 10/04/2024, 11:12 AM     [1]  Current Outpatient Medications:    Ascorbic Acid (VITAMIN C) 1000 MG tablet, Take 1,000 mg by mouth daily., Disp: , Rfl:    atorvastatin  (LIPITOR) 20 MG tablet, TAKE 1 TABLET BY MOUTH DAILY AT  6PM, Disp: 90 tablet, Rfl: 3   b complex vitamins tablet, Take 1 tablet by mouth daily., Disp: , Rfl:    baclofen  (LIORESAL ) 10 MG tablet, Take 0.5-1 tablets (5-10 mg total) by mouth 2 (two) times daily as needed for muscle spasms (for back).  Do not take on same day as Flexeril , Disp: 180 each, Rfl: 1   Blood Pressure Monitoring (SM WRIST CUFF BP MONITOR) MISC, 1 Units by Does not apply route 2 (two) times a week., Disp: 1 each, Rfl: 0   buPROPion  (WELLBUTRIN  XL) 150 MG 24 hr tablet, Take 450 mg by mouth daily., Disp: , Rfl:    busPIRone  (BUSPAR ) 10 MG tablet, TAKE 1 TABLET BY MOUTH TWICE  DAILY, Disp: 180 tablet, Rfl: 1   Cholecalciferol (VITAMIN D) 50 MCG (2000 UT) tablet, Take 4,000 Units by mouth daily., Disp: , Rfl:    Cobalamin Combinations (NEURIVA PLUS PO), Take 1 capsule by mouth daily in the afternoon., Disp: , Rfl:    cyanocobalamin 1000 MCG tablet, Take 1,000 mcg by mouth daily., Disp: , Rfl:    cyclobenzaprine  (FLEXERIL ) 10 MG tablet, Take 1 tablet (10 mg total) by mouth 2 (two) times daily as needed for muscle spasms (for neck). Do not take on same day as Baclofen , Disp: 180 tablet, Rfl: 1   Iron , Ferrous Sulfate , 325 (65 Fe) MG TABS, Take 1 tablet by mouth daily., Disp: , Rfl:    lisinopril -hydrochlorothiazide  (ZESTORETIC ) 10-12.5 MG tablet, Take 1 tablet by mouth daily., Disp: 90 tablet, Rfl: 3   mirtazapine  (REMERON ) 30 MG tablet, Take 30 mg by mouth at bedtime., Disp: , Rfl:    traZODone  (DESYREL ) 150 MG tablet, Take 150 mg by mouth at bedtime., Disp: , Rfl:    levothyroxine  (SYNTHROID ) 75 MCG tablet, TAKE 1 TABLET BY MOUTH DAILY  BEFORE BREAKFAST, Disp: 100 tablet, Rfl: 2   MIRALAX  17 GM/SCOOP powder, Take 17 g by mouth daily. (Patient not taking: Reported on 10/04/2024), Disp: , Rfl:    terazosin  (HYTRIN ) 5 MG capsule, TAKE 1 CAPSULE BY MOUTH DAILY, Disp: 100 capsule, Rfl: 2 [2]  Social History Tobacco Use   Smoking status: Former    Current packs/day: 0.00    Average packs/day: 1.5 packs/day for 20.0 years (  30.0 ttl pk-yrs)    Types: Cigarettes    Start date: 05/02/1997    Quit date: 05/02/2017    Years since quitting: 7.4    Passive exposure: Past   Smokeless tobacco: Former   Tobacco comments:    Starts and  stops related to anxiety rather than drinking alcohol  Vaping Use   Vaping status: Never Used  Substance Use Topics   Alcohol use: No    Alcohol/week: 0.0 standard drinks of alcohol    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

## 2024-10-05 ENCOUNTER — Encounter: Payer: Self-pay | Admitting: Family Medicine

## 2024-10-09 ENCOUNTER — Other Ambulatory Visit (HOSPITAL_COMMUNITY): Payer: Self-pay

## 2024-10-10 ENCOUNTER — Ambulatory Visit: Admitting: Emergency Medicine

## 2024-10-10 VITALS — BP 124/62 | Ht 69.0 in | Wt 162.4 lb

## 2024-10-10 DIAGNOSIS — Z0000 Encounter for general adult medical examination without abnormal findings: Secondary | ICD-10-CM

## 2024-10-10 NOTE — Patient Instructions (Signed)
 [name],  Thank you for taking the time for your Medicare Wellness Visit. I appreciate your continued commitment to your health goals. Please review the care plan we discussed, and feel free to reach out if I can assist you further.  Please note that Annual Wellness Visits do not include a physical exam. Some assessments may be limited, especially if the visit was conducted virtually. If needed, we may recommend an in-person follow-up with your provider.  Ongoing Care Seeing your primary care provider every 3 to 6 months helps us  monitor your health and provide consistent, personalized care.   Referrals If a referral was made during today's visit and you haven't received any updates within two weeks, please contact the referred provider directly to check on the status.  Recommended Screenings:  Get a routine eye exam every 1-2 years. I have included a list of eye doctors.  You may get the shingles and tetanus vaccines at your local pharmacy at your convenience.   Health Maintenance  Topic Date Due   DTaP/Tdap/Td vaccine (1 - Tdap) Never done   Zoster (Shingles) Vaccine (1 of 2) [birth year]1960   Medicare Annual Wellness Visit  09/29/2024   COVID-19 Vaccine (8 - Pfizer risk 2025-26 season) 11/26/2024   Pneumococcal Vaccine for age over 49  Completed   Flu Shot  Completed   Meningitis B Vaccine  Aged Out       10/10/2024    1:55 PM  Advanced Directives  Does Patient Have a Medical Advance Directive? Yes  Type of Estate Agent of [place];Living will  Does patient want to make changes to medical advance directive? No - Patient declined  Copy of Healthcare Power of Attorney in Chart? No - copy requested    Vision: Annual vision screenings are recommended for early detection of glaucoma, cataracts, and diabetic retinopathy. These exams can also reveal signs of chronic conditions such as diabetes and high blood pressure.  Dental: Annual dental screenings help  detect early signs of oral cancer, gum disease, and other conditions linked to overall health, including heart disease and diabetes.  Please see the attached documents for additional preventive care recommendations.

## 2024-10-10 NOTE — Progress Notes (Signed)
 "  Chief Complaint  Patient presents with   Medicare Wellness     Subjective:   Joe Hardy is a 84 y.o. male who presents for a Medicare Annual Wellness Visit.  Visit info / Clinical Intake: Medicare Wellness Visit Type:: Subsequent Annual Wellness Visit Persons participating in visit and providing information:: patient Medicare Wellness Visit Mode:: In-person (required for WTM) Interpreter Needed?: No Pre-visit prep was completed: yes AWV questionnaire completed by patient prior to visit?: no Living arrangements:: (!) lives alone Patient's Overall Health Status Rating: (!) fair Typical amount of pain: (!) a lot Does pain affect daily life?: (!) yes Are you currently prescribed opioids?: no  Dietary Habits and Nutritional Risks How many meals a day?: 3 Eats fruit and vegetables daily?: (!) no Most meals are obtained by: preparing own meals In the last 2 weeks, have you had any of the following?: none Diabetic:: no  Functional Status Activities of Daily Living (to include ambulation/medication): (!) Needs Assist (home health comes to home 3 times per week) Feeding: Independent Dressing/Grooming: Independent Bathing: Independent (sink baths) Toileting: Independent Transfer: Independent Ambulation: Independent with device- listed below Home Assistive Devices/Equipment: Wheelchair; Eyeglasses; Walker (specify Type); Cane (glasses for reading, rolling walker) Medication Administration: Independent Home Management (perform basic housework or laundry): Independent Manage your own finances?: yes Primary transportation is: family / friends Concerns about vision?: (!) yes (needs routine eye exam and has dry eyes) Concerns about hearing?: (!) yes (some hearing loss) Uses hearing aids?: no Hear whispered voice?: (!) no *in-person visit only*  Fall Screening Falls in the past year?: 1 Number of falls in past year: 1 Was there an injury with Fall?: 0 Fall Risk Category  Calculator: 2 Patient Fall Risk Level: Moderate Fall Risk  Fall Risk Patient at Risk for Falls Due to: History of fall(s); Impaired balance/gait; Impaired mobility; Orthopedic patient Fall risk Follow up: Falls evaluation completed; Education provided; Falls prevention discussed  Home and Transportation Safety: All rugs have non-skid backing?: (!) no All stairs or steps have railings?: N/A, no stairs Grab bars in the bathtub or shower?: (!) no Have non-skid surface in bathtub or shower?: (!) no Good home lighting?: yes Regular seat belt use?: yes Hospital stays in the last year:: (!) yes How many hospital stays:: 1 Reason: difficulty walking  Cognitive Assessment Difficulty concentrating, remembering, or making decisions? : yes Will 6CIT or Mini Cog be Completed: yes What year is it?: 0 points What month is it?: 0 points Give patient an address phrase to remember (5 components): [address] [place] About what time is it?: 0 points Count backwards from 20 to 1: 0 points Say the months of the year in reverse: 0 points Repeat the address phrase from earlier: 6 points 6 CIT Score: 6 points  Advance Directives (For Healthcare) Does Patient Have a Medical Advance Directive?: Yes Does patient want to make changes to medical advance directive?: No - Patient declined Type of Advance Directive: Healthcare Power of [place]; Living will Copy of Healthcare Power of Attorney in Chart?: No - copy requested Copy of Living Will in Chart?: No - copy requested  Reviewed/Updated  Reviewed/Updated: Reviewed All (Medical, Surgical, Family, Medications, Allergies, Care Teams, Patient Goals)    Allergies (verified) Fluoxetine and Venlafaxine   Current Medications (verified) Outpatient Encounter Medications as of 10/10/2024  Medication Sig   Ascorbic Acid (VITAMIN C) 1000 MG tablet Take 1,000 mg by mouth daily.   atorvastatin  (LIPITOR) 20 MG tablet TAKE 1 TABLET BY  MOUTH DAILY AT  6PM    b complex vitamins tablet Take 1 tablet by mouth daily.   baclofen  (LIORESAL ) 10 MG tablet Take 0.5-1 tablets (5-10 mg total) by mouth 2 (two) times daily as needed for muscle spasms (for back). Do not take on same day as Flexeril    Blood Pressure Monitoring (SM WRIST CUFF BP MONITOR) MISC 1 Units by Does not apply route 2 (two) times a week.   buPROPion  (WELLBUTRIN  XL) 150 MG 24 hr tablet Take 450 mg by mouth daily.   busPIRone  (BUSPAR ) 10 MG tablet TAKE 1 TABLET BY MOUTH TWICE  DAILY   Cholecalciferol (VITAMIN D) 50 MCG (2000 UT) tablet Take 4,000 Units by mouth daily.   Cobalamin Combinations (NEURIVA PLUS PO) Take 1 capsule by mouth daily in the afternoon.   cyanocobalamin 1000 MCG tablet Take 1,000 mcg by mouth daily.   cyclobenzaprine  (FLEXERIL ) 10 MG tablet Take 1 tablet (10 mg total) by mouth 2 (two) times daily as needed for muscle spasms (for neck). Do not take on same day as Baclofen    Iron , Ferrous Sulfate , 325 (65 Fe) MG TABS Take 1 tablet by mouth daily.   levothyroxine  (SYNTHROID ) 75 MCG tablet TAKE 1 TABLET BY MOUTH DAILY  BEFORE BREAKFAST   lisinopril -hydrochlorothiazide  (ZESTORETIC ) 10-12.5 MG tablet Take 1 tablet by mouth daily.   MIRALAX  17 GM/SCOOP powder Take 17 g by mouth daily. (Patient taking differently: Take 17 g by mouth daily. PRN)   mirtazapine  (REMERON ) 30 MG tablet Take 30 mg by mouth at bedtime.   terazosin  (HYTRIN ) 5 MG capsule TAKE 1 CAPSULE BY MOUTH DAILY   traZODone  (DESYREL ) 150 MG tablet Take 150 mg by mouth at bedtime.   No facility-administered encounter medications on file as of 10/10/2024.    History: Past Medical History:  Diagnosis Date   Bladder stones    Depression    GERD (gastroesophageal reflux disease)    Hyperlipidemia    Hypertension    Iron  deficiency anemia    Migraine    Sleep apnea    Past Surgical History:  Procedure Laterality Date   COLONOSCOPY     CYSTOSCOPY WITH LITHOLAPAXY N/A 11/26/2022   Procedure: CYSTOSCOPY WITH  LITHOLAPAXY;  Surgeon: Francisca Redell BROCKS, MD;  Location: [facility] OR[facility];  Service: Urology;  Laterality: N/A;   HOLEP-LASER ENUCLEATION OF THE PROSTATE WITH MORCELLATION N/A 11/26/2022   Procedure: HOLEP-LASER ENUCLEATION OF THE PROSTATE WITH MORCELLATION;  Surgeon: Francisca Redell BROCKS, MD;  Location: [facility] OR[facility];  Service: Urology;  Laterality: N/A;   oral surgery     TONSILLECTOMY     Family History  Problem Relation Age of Onset   Tuberculosis Mother    Asthma Mother    Emphysema Father    Heart attack Father    Social History   Occupational History   Occupation: retired  Tobacco Use   Smoking status: Former    Current packs/day: 0.00    Average packs/day: 1.2 packs/day for 36.0 years (42.0 ttl pk-yrs)    Types: Cigarettes    Start date: 52    Quit date: 05/02/2017    Years since quitting: 7.4    Passive exposure: Past   Smokeless tobacco: Former   Tobacco comments:    Starts and stops related to anxiety rather than drinking alcohol  Vaping Use   Vaping status: Never Used  Substance and Sexual Activity   Alcohol use: No    Alcohol/week: 0.0 standard drinks of alcohol    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)   Drug use: Not Currently    Types: Marijuana    Comment: last use 2002   Sexual activity: Not Currently   Tobacco Counseling Counseling given: Not Answered Tobacco comments: Starts and stops related to anxiety rather than drinking alcohol  SDOH Screenings   Food Insecurity: No Food Insecurity (10/10/2024)  Housing: Low Risk (10/10/2024)  Transportation Needs: No Transportation Needs (10/10/2024)  Utilities: Not At Risk (10/10/2024)  Alcohol Screen: Low Risk (09/30/2023)  Depression (PHQ2-9): Low Risk (10/10/2024)  Financial Resource Strain: Low Risk  (04/26/2024)   [facility]  Physical Activity: Inactive (10/10/2024)  Social Connections: Socially Isolated (10/10/2024)  Stress: No Stress Concern Present (10/10/2024)  Tobacco Use:  Medium Risk (10/10/2024)  Health Literacy: Adequate Health Literacy (10/10/2024)   See flowsheets for full screening details  Depression Screen PHQ 2 & 9 Depression Scale- Over the past 2 weeks, how often have you been bothered by any of the following problems? Little interest or pleasure in doing things: 1 Feeling down, depressed, or hopeless (PHQ Adolescent also includes...irritable): 2 PHQ-2 Total Score: 3 Trouble falling or staying asleep, or sleeping too much: 0 Feeling tired or having little energy: 0 Poor appetite or overeating (PHQ Adolescent also includes...weight loss): 0 Feeling bad about yourself - or that you are a failure or have let yourself or your family down: 1 Trouble concentrating on things, such as reading the newspaper or watching television (PHQ Adolescent also includes...like school work): 0 Moving or speaking so slowly that other people could have noticed. Or the opposite - being so fidgety or restless that you have been moving around a lot more than usual: 0 Thoughts that you would be better off dead, or of hurting yourself in some way: 0 PHQ-9 Total Score: 4 If you checked off any problems, how difficult have these problems made it for you to do your work, take care of things at home, or get along with other people?: Somewhat difficult  Depression Treatment Depression Interventions/Treatment : Currently on Treatment     Goals Addressed               This Visit's Progress     stay as active as I can (pt-stated)        COMPLETED: Weight < 200 lb (90.7 kg)   162 lb 6.4 oz (73.7 kg)     Wants to loss weight and having less depression             Objective:    Today's Vitals   10/10/24 1344  BP: 124/62  Weight: 162 lb 6.4 oz (73.7 kg)  Height: 5' 9 (1.753 m)   Body mass index is 23.98 kg/m.  Hearing/Vision screen Hearing Screening - Comments:: Some hearing loss Vision Screening - Comments:: Needs routine eye exam. Included list of eye  doctors in AVS Immunizations and Health Maintenance Health Maintenance  Topic Date Due   DTaP/Tdap/Td (1 - Tdap) Never done   Zoster Vaccines- Shingrix (1 of 2) 06/11/1960   COVID-19 Vaccine (8 - Pfizer risk 2025-26 season) 11/26/2024   Medicare Annual Wellness (AWV)  10/10/2025   Pneumococcal Vaccine: 50+ Years  Completed   Influenza Vaccine  Completed   Meningococcal B Vaccine  Aged Out        Assessment/Plan:  This is a routine wellness examination for Elsie.  Patient Care Team: Edman Marsa PARAS, DO as PCP - General (Family Medicine) Deane Rollo RAMAN, NP as Nurse Practitioner (Psychiatry)  Clois Fret, MD as Consulting Physician (Neurosurgery) Council, [employer] GRADE, NP as Nurse Practitioner (Surgical Oncology) Francisca Redell BROCKS, MD as Consulting Physician (Urology) Cathlyn Seal, MD as Referring Physician (Dermatology)  I have personally reviewed and noted the following in the patients chart:   Medical and social history Use of alcohol, tobacco or illicit drugs  Current medications and supplements including opioid prescriptions. Functional ability and status Nutritional status Physical activity Advanced directives List of other physicians Hospitalizations, surgeries, and ER visits in previous 12 months Vitals Screenings to include cognitive, depression, and falls Referrals and appointments  No orders of the defined types were placed in this encounter.  In addition, I have reviewed and discussed with patient certain preventive protocols, quality metrics, and best practice recommendations. A written personalized care plan for preventive services as well as general preventive health recommendations were provided to patient.   Vina Ned, CMA   10/10/2024   Return in 53 weeks (on 10/16/2025) for Medicare Annual Wellness Visit.  After Visit Summary: (In Person-Printed) AVS printed and given to the patient  Nurse Notes:  6 CIT Score - 6 Needs  shingles and Tdap vaccines (pharmacy) Screening colonoscopy and LDCT no longer recommended due to age.  "

## 2024-10-11 ENCOUNTER — Other Ambulatory Visit: Payer: Self-pay

## 2024-10-30 ENCOUNTER — Other Ambulatory Visit: Admitting: Urology

## 2024-11-22 ENCOUNTER — Other Ambulatory Visit

## 2024-11-29 ENCOUNTER — Ambulatory Visit: Admitting: Family Medicine

## 2025-10-16 ENCOUNTER — Ambulatory Visit
